# Patient Record
Sex: Female | Born: 1950
Health system: Southern US, Community
[De-identification: ages and names within clinical notes are randomized; demographics above are authoritative.]

## PROBLEM LIST (undated history)

## (undated) DIAGNOSIS — E119 Type 2 diabetes mellitus without complications: Secondary | ICD-10-CM

## (undated) DIAGNOSIS — T7840XA Allergy, unspecified, initial encounter: Secondary | ICD-10-CM

## (undated) DIAGNOSIS — F329 Major depressive disorder, single episode, unspecified: Secondary | ICD-10-CM

## (undated) DIAGNOSIS — K219 Gastro-esophageal reflux disease without esophagitis: Secondary | ICD-10-CM

## (undated) DIAGNOSIS — M1990 Unspecified osteoarthritis, unspecified site: Secondary | ICD-10-CM

## (undated) DIAGNOSIS — F32A Depression, unspecified: Secondary | ICD-10-CM

## (undated) DIAGNOSIS — G709 Myoneural disorder, unspecified: Secondary | ICD-10-CM

## (undated) DIAGNOSIS — R011 Cardiac murmur, unspecified: Secondary | ICD-10-CM

## (undated) DIAGNOSIS — G4730 Sleep apnea, unspecified: Secondary | ICD-10-CM

## (undated) DIAGNOSIS — I10 Essential (primary) hypertension: Secondary | ICD-10-CM

## (undated) HISTORY — DX: Sleep apnea, unspecified: G47.30

## (undated) HISTORY — DX: Cardiac murmur, unspecified: R01.1

## (undated) HISTORY — DX: Gastro-esophageal reflux disease without esophagitis: K21.9

## (undated) HISTORY — DX: Major depressive disorder, single episode, unspecified: F32.9

## (undated) HISTORY — DX: Allergy, unspecified, initial encounter: T78.40XA

## (undated) HISTORY — DX: Essential (primary) hypertension: I10

## (undated) HISTORY — PX: ABDOMINAL HYSTERECTOMY: SHX81

## (undated) HISTORY — DX: Unspecified osteoarthritis, unspecified site: M19.90

## (undated) HISTORY — DX: Type 2 diabetes mellitus without complications: E11.9

## (undated) HISTORY — DX: Depression, unspecified: F32.A

## (undated) HISTORY — DX: Myoneural disorder, unspecified: G70.9

---
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
## 1994-08-18 DIAGNOSIS — Z90710 Acquired absence of both cervix and uterus: Secondary | ICD-10-CM | POA: Insufficient documentation

## 1998-08-23 ENCOUNTER — Encounter: Admission: RE | Admit: 1998-08-23 | Discharge: 1998-11-21 | Payer: Self-pay | Admitting: Emergency Medicine

## 1999-03-11 ENCOUNTER — Encounter: Admission: RE | Admit: 1999-03-11 | Discharge: 1999-03-11 | Payer: Self-pay | Admitting: Emergency Medicine

## 1999-03-11 ENCOUNTER — Encounter: Payer: Self-pay | Admitting: Emergency Medicine

## 1999-03-18 ENCOUNTER — Encounter: Admission: RE | Admit: 1999-03-18 | Discharge: 1999-03-18 | Payer: Self-pay | Admitting: Emergency Medicine

## 1999-03-18 ENCOUNTER — Encounter: Payer: Self-pay | Admitting: Emergency Medicine

## 2000-12-13 ENCOUNTER — Encounter: Payer: Self-pay | Admitting: Emergency Medicine

## 2000-12-13 ENCOUNTER — Encounter: Admission: RE | Admit: 2000-12-13 | Discharge: 2000-12-13 | Payer: Self-pay | Admitting: Emergency Medicine

## 2000-12-19 ENCOUNTER — Encounter: Payer: Self-pay | Admitting: Emergency Medicine

## 2000-12-19 ENCOUNTER — Encounter: Admission: RE | Admit: 2000-12-19 | Discharge: 2000-12-19 | Payer: Self-pay | Admitting: Emergency Medicine

## 2001-06-25 ENCOUNTER — Encounter: Payer: Self-pay | Admitting: Emergency Medicine

## 2001-06-25 ENCOUNTER — Encounter: Admission: RE | Admit: 2001-06-25 | Discharge: 2001-06-25 | Payer: Self-pay | Admitting: Emergency Medicine

## 2002-07-29 ENCOUNTER — Encounter: Admission: RE | Admit: 2002-07-29 | Discharge: 2002-07-29 | Payer: Self-pay | Admitting: Emergency Medicine

## 2002-07-29 ENCOUNTER — Encounter: Payer: Self-pay | Admitting: Emergency Medicine

## 2003-03-25 ENCOUNTER — Encounter (INDEPENDENT_AMBULATORY_CARE_PROVIDER_SITE_OTHER): Payer: Self-pay | Admitting: *Deleted

## 2003-03-25 ENCOUNTER — Ambulatory Visit (HOSPITAL_COMMUNITY): Admission: RE | Admit: 2003-03-25 | Discharge: 2003-03-25 | Payer: Self-pay | Admitting: Gastroenterology

## 2003-05-29 ENCOUNTER — Encounter: Admission: RE | Admit: 2003-05-29 | Discharge: 2003-05-29 | Payer: Self-pay | Admitting: Emergency Medicine

## 2003-07-09 ENCOUNTER — Encounter: Admission: RE | Admit: 2003-07-09 | Discharge: 2003-07-09 | Payer: Self-pay | Admitting: Emergency Medicine

## 2004-01-01 ENCOUNTER — Other Ambulatory Visit: Admission: RE | Admit: 2004-01-01 | Discharge: 2004-01-01 | Payer: Self-pay | Admitting: Emergency Medicine

## 2004-07-12 ENCOUNTER — Encounter: Admission: RE | Admit: 2004-07-12 | Discharge: 2004-07-12 | Payer: Self-pay | Admitting: Emergency Medicine

## 2005-10-27 ENCOUNTER — Encounter: Admission: RE | Admit: 2005-10-27 | Discharge: 2005-10-27 | Payer: Self-pay | Admitting: Emergency Medicine

## 2006-10-30 ENCOUNTER — Encounter: Admission: RE | Admit: 2006-10-30 | Discharge: 2006-10-30 | Payer: Self-pay | Admitting: Emergency Medicine

## 2008-06-17 ENCOUNTER — Other Ambulatory Visit: Admission: RE | Admit: 2008-06-17 | Discharge: 2008-06-17 | Payer: Self-pay | Admitting: Emergency Medicine

## 2008-09-01 ENCOUNTER — Other Ambulatory Visit: Admission: RE | Admit: 2008-09-01 | Discharge: 2008-09-01 | Payer: Self-pay | Admitting: Diagnostic Radiology

## 2008-09-01 ENCOUNTER — Encounter (INDEPENDENT_AMBULATORY_CARE_PROVIDER_SITE_OTHER): Payer: Self-pay | Admitting: Diagnostic Radiology

## 2008-09-01 ENCOUNTER — Encounter: Admission: RE | Admit: 2008-09-01 | Discharge: 2008-09-01 | Payer: Self-pay | Admitting: Otolaryngology

## 2009-03-16 ENCOUNTER — Encounter: Admission: RE | Admit: 2009-03-16 | Discharge: 2009-03-16 | Payer: Self-pay | Admitting: Otolaryngology

## 2009-10-13 ENCOUNTER — Emergency Department (HOSPITAL_COMMUNITY): Admission: EM | Admit: 2009-10-13 | Discharge: 2009-10-13 | Payer: Self-pay | Admitting: Family Medicine

## 2009-12-14 ENCOUNTER — Encounter: Admission: RE | Admit: 2009-12-14 | Discharge: 2009-12-14 | Payer: Self-pay | Admitting: Internal Medicine

## 2009-12-14 ENCOUNTER — Encounter: Admission: RE | Admit: 2009-12-14 | Discharge: 2009-12-14 | Payer: Self-pay | Admitting: Endocrinology

## 2009-12-20 ENCOUNTER — Encounter: Admission: RE | Admit: 2009-12-20 | Discharge: 2009-12-20 | Payer: Self-pay | Admitting: Internal Medicine

## 2010-01-04 ENCOUNTER — Other Ambulatory Visit: Admission: RE | Admit: 2010-01-04 | Discharge: 2010-01-04 | Payer: Self-pay | Admitting: Interventional Radiology

## 2010-01-04 ENCOUNTER — Encounter: Admission: RE | Admit: 2010-01-04 | Discharge: 2010-01-04 | Payer: Self-pay | Admitting: Endocrinology

## 2010-04-29 LAB — POCT I-STAT, CHEM 8
BUN: 9 mg/dL (ref 6–23)
Calcium, Ion: 1.19 mmol/L (ref 1.12–1.32)
Hemoglobin: 12.9 g/dL (ref 12.0–15.0)
TCO2: 25 mmol/L (ref 0–100)

## 2010-04-29 LAB — D-DIMER, QUANTITATIVE: D-Dimer, Quant: <0.22 ug{FEU}/mL (ref 0.00–0.48)

## 2010-07-01 NOTE — Op Note (Signed)
NAME:  Michelle Hicks, Michelle Hicks                        ACCOUNT NO.:  [id number]   MEDICAL RECORD NO.:  [id number]                   PATIENT TYPE:  AMB   LOCATION:  ENDO                                 FACILITY:  [facility]   PHYSICIAN:  John C. Madilyn Fireman, M.D.                 DATE OF BIRTH:  11/20/1950   DATE OF PROCEDURE:  03/25/2003  DATE OF DISCHARGE:                                 OPERATIVE REPORT   PROCEDURE:  Esophagogastroduodenoscopy.   INDICATION FOR PROCEDURE:  Recurrent dyspepsia despite recent treatment for  eradication of Helicobacter in a patient who is also undergoing colonoscopy  today.   DESCRIPTION OF PROCEDURE:  The patient was placed in the left lateral  decubitus position and placed on the pulse monitor with continuous low-flow  oxygen delivered by nasal cannula.  She was sedated with 62.5 mcg IV  fentanyl and 6 mg IV Versed.  The Olympus video endoscope was advanced under  direct vision into the oropharynx and esophagus.  The esophagus was straight  and of normal caliber with the squamocolumnar line at 38 cm.  There was no  visible hiatal hernia, ring, stricture, or other abnormality of the GE  junction.  The stomach was entered and a small amount of liquid secretions  was suctioned from the fundus.  Retroflexed view of the cardia was  unremarkable.  The fundus, body, antrum, and pylorus all appeared normal.  The duodenum was entered and both the bulb and second portion were well-  inspected and appeared to be within normal limits.  The scope was withdrawn  back into the stomach and a CLOtest obtained.  The scope was then withdrawn  and the patient returned to the recovery room in stable condition.  She  tolerated the procedure well, and there were no immediate complications.   IMPRESSION:  Essentially normal endoscopy.   PLAN:  Await CLOtest, and will proceed to colonoscopy.                                               John C. Madilyn Fireman, M.D.    JCH/MEDQ  D:  03/25/2003   T:  03/25/2003  Job:  [id number]   cc:   Oley Balm. Georgina Pillion, M.D.  [address]  [place]  [place] [address]  Fax: [phone]

## 2010-07-01 NOTE — Op Note (Signed)
NAME:  Michelle Hicks, Michelle Hicks                        ACCOUNT NO.:  [id number]   MEDICAL RECORD NO.:  [id number]                   PATIENT TYPE:  AMB   LOCATION:  ENDO                                 FACILITY:  [facility]   PHYSICIAN:  John C. Madilyn Fireman, M.D.                 DATE OF BIRTH:  1950/12/08   DATE OF PROCEDURE:  03/25/2003  DATE OF DISCHARGE:                                 OPERATIVE REPORT   PROCEDURE:  Colonoscopy.   INDICATION FOR PROCEDURE:  Family history of colon cancer in a second degree  relative.   DESCRIPTION OF PROCEDURE:  The patient was placed in the left lateral  decubitus position and placed on the pulse monitor with continuous low-flow  oxygen delivered by nasal cannula.  She was sedated with 37.5 mcg IV  fentanyl and 3 mg IV Versed.  The Olympus video colonoscope was inserted  into the rectum and advanced to the cecum, confirmed by transillumination at  McBurney's point and visualization of the ileocecal valve and appendiceal  orifice.  The prep was excellent.  The cecum appeared normal with no masses,  polyps, diverticula, or other mucosal abnormalities.  Within the ascending  colon there was an 8-10 mm pedunculated polyp, which was removed by snare.  The remainder of the ascending, transverse, descending, sigmoid, and rectum  appeared normal with no further polyps, masses, diverticula, or other  abnormalities.  A retroflexed view of the anus revealed no obvious internal  hemorrhoids.  The scope was then withdrawn and the patient returned to the  recovery room in stable condition.  She tolerated the procedure well, and  there were no immediate complications.   IMPRESSION:  An 8 mm ascending colon polyp.   PLAN:  Await histology to determine method and interval for future colon  screening.                                               John C. Madilyn Fireman, M.D.    JCH/MEDQ  D:  03/25/2003  T:  03/25/2003  Job:  [id number]   cc:   Oley Balm. Georgina Pillion, M.D.  [address]  [place]  [place] [address]  Fax: [phone]

## 2010-12-14 ENCOUNTER — Other Ambulatory Visit: Payer: Self-pay | Admitting: Internal Medicine

## 2010-12-14 DIAGNOSIS — Z1231 Encounter for screening mammogram for malignant neoplasm of breast: Secondary | ICD-10-CM

## 2010-12-20 ENCOUNTER — Ambulatory Visit: Payer: Self-pay

## 2010-12-29 ENCOUNTER — Ambulatory Visit
Admission: RE | Admit: 2010-12-29 | Discharge: 2010-12-29 | Disposition: A | Discharge status: Home / Self Care | Payer: Managed Care, Other (non HMO) | Source: Ambulatory Visit | Attending: Internal Medicine | Admitting: Internal Medicine

## 2010-12-29 DIAGNOSIS — Z1231 Encounter for screening mammogram for malignant neoplasm of breast: Secondary | ICD-10-CM

## 2011-04-06 ENCOUNTER — Other Ambulatory Visit: Payer: Self-pay | Admitting: Endocrinology

## 2011-04-06 DIAGNOSIS — E049 Nontoxic goiter, unspecified: Secondary | ICD-10-CM

## 2011-05-19 ENCOUNTER — Ambulatory Visit
Admission: RE | Admit: 2011-05-19 | Discharge: 2011-05-19 | Disposition: A | Discharge status: Home / Self Care | Payer: Managed Care, Other (non HMO) | Source: Ambulatory Visit | Attending: Endocrinology | Admitting: Endocrinology

## 2011-05-19 DIAGNOSIS — E049 Nontoxic goiter, unspecified: Secondary | ICD-10-CM

## 2011-05-31 ENCOUNTER — Other Ambulatory Visit: Payer: Self-pay | Admitting: Endocrinology

## 2011-05-31 DIAGNOSIS — E049 Nontoxic goiter, unspecified: Secondary | ICD-10-CM

## 2011-07-12 ENCOUNTER — Other Ambulatory Visit: Payer: Self-pay

## 2011-07-12 ENCOUNTER — Telehealth: Payer: Self-pay

## 2011-07-12 NOTE — Telephone Encounter (Signed)
Pt called and stated she was calling to let Dr.Stringer know she has not received Estrogen Rx. Her pharm is [facility] on [address] . Pt pharm has been added to her chart.

## 2011-07-13 ENCOUNTER — Telehealth: Payer: Self-pay

## 2011-07-13 NOTE — Telephone Encounter (Signed)
Message was left for pt to call the office back Regarding Rx she stated she has not received. Pt will be made aware that her Ins denied the Rx for estrogen. [facility] CMA

## 2011-07-17 ENCOUNTER — Other Ambulatory Visit: Payer: Managed Care, Other (non HMO)

## 2011-07-17 ENCOUNTER — Telehealth: Payer: Self-pay

## 2011-07-17 NOTE — Telephone Encounter (Signed)
Pt was called and made aware that her Ins denied Rx for Estrogen . [facility] CMA

## 2011-08-30 IMAGING — US US BIOPSY
1 series · 12 of 12 positions shown · non-contrast
Comparison: 12/14/2009

CLINICAL DATA: Dominant right thyroid nodule demonstrating
enlargement.  Previous biopsy.

ULTRASOUND GUIDED NEEDLE ASPIRATE BIOPSY OF THE THYROID GLAND

[Series 1: us biopsy · 0.08mm/px · 12 acquisitions, 12 frames shown]
[im 1/12]
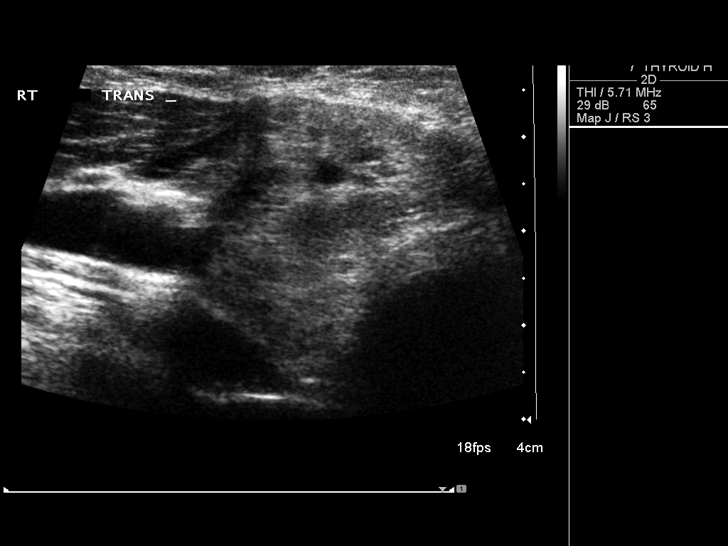
[im 2/12]
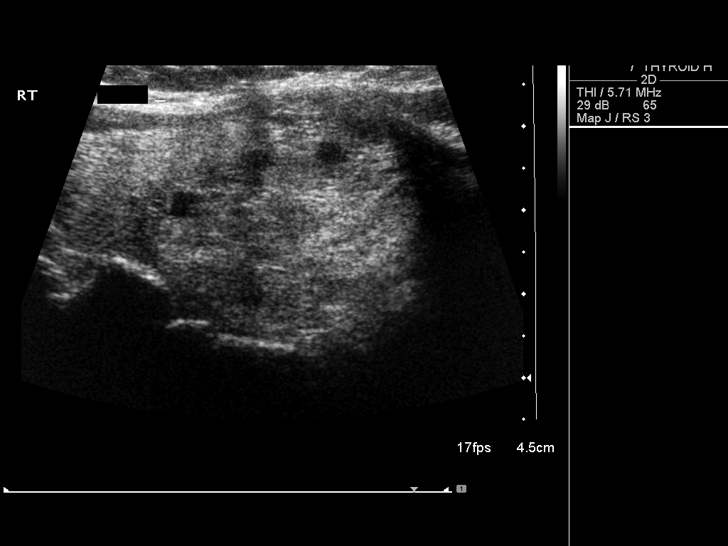
[im 3/12]
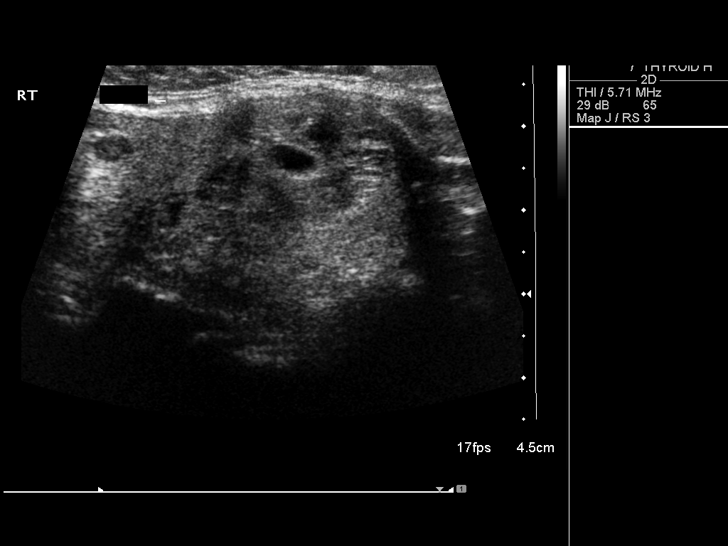
[im 4/12]
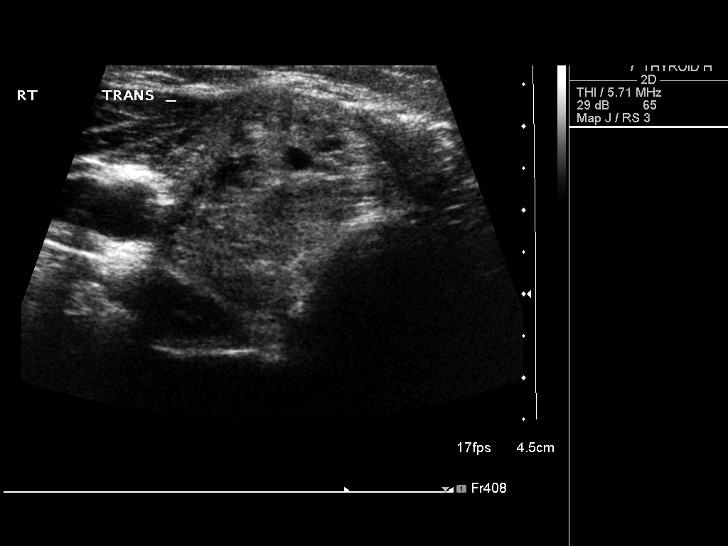
[im 5/12]
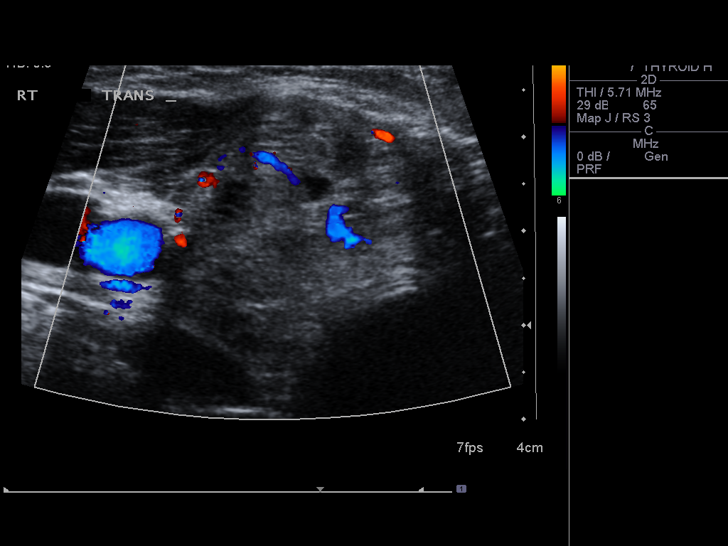
[im 6/12]
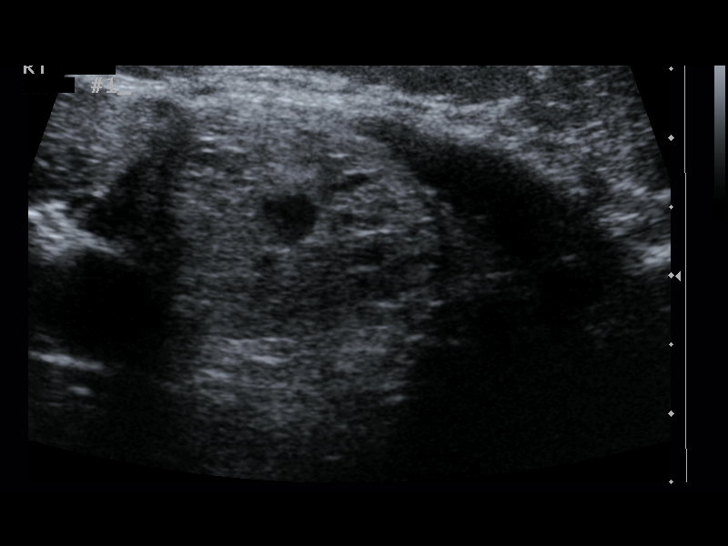
[im 7/12]
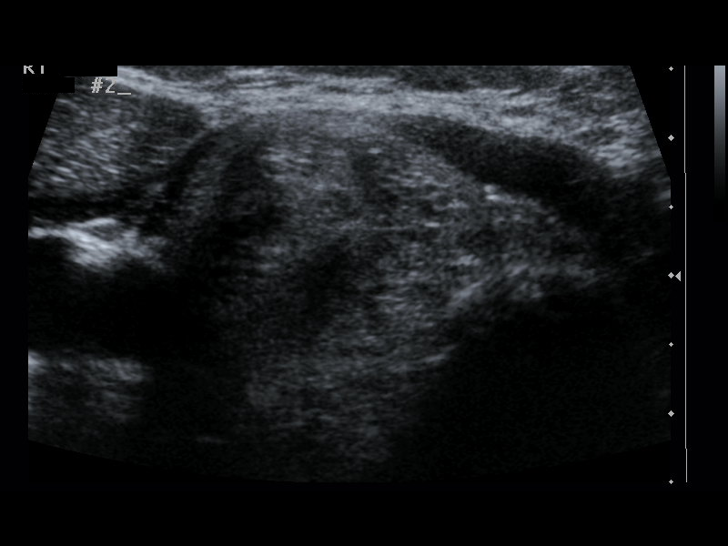
[im 8/12]
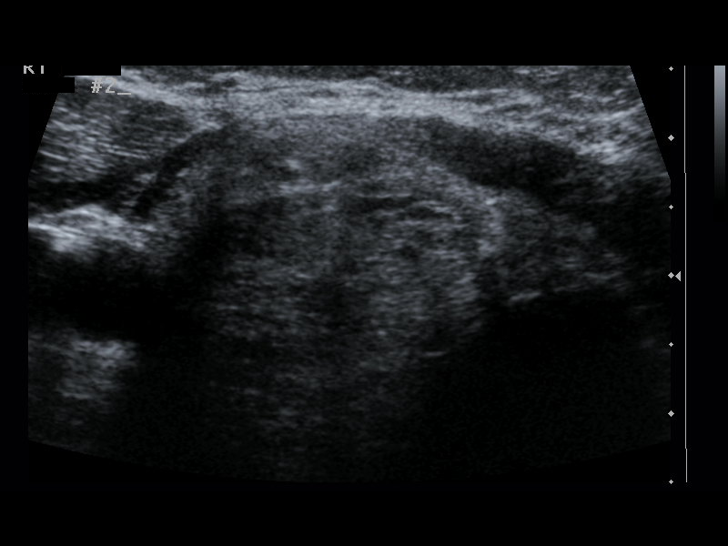
[im 9/12]
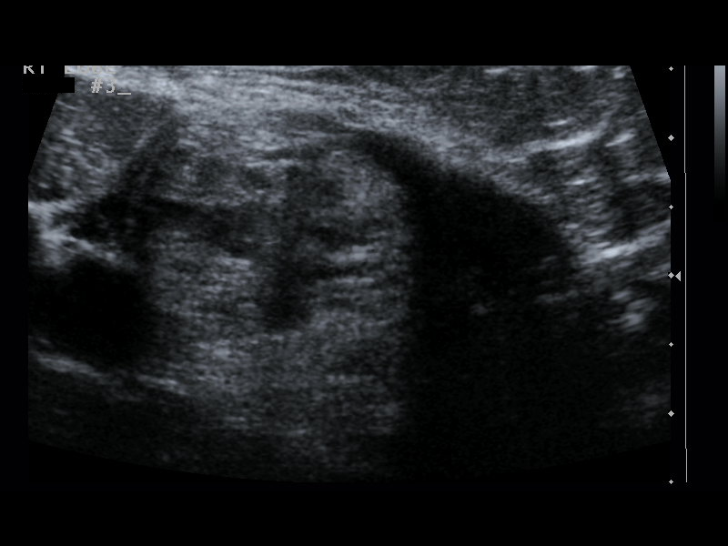
[im 10/12]
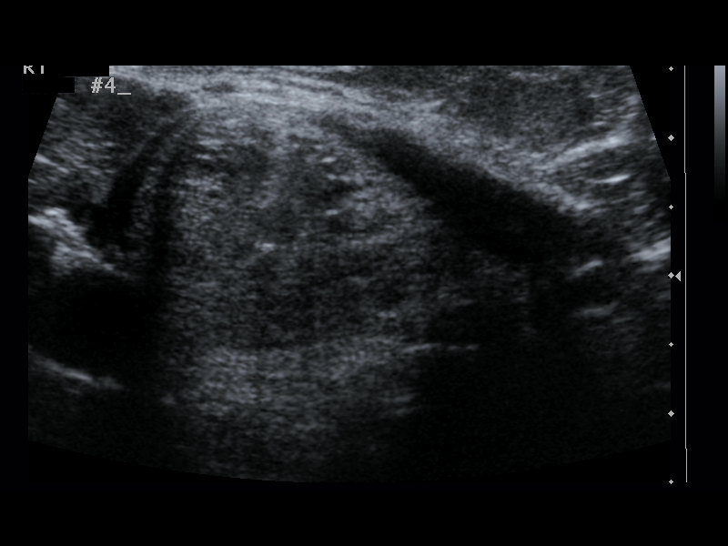
[im 11/12]
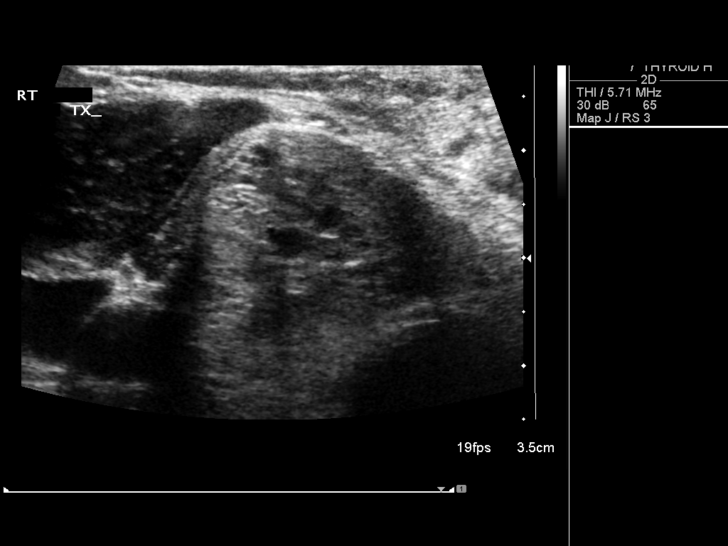
[im 12/12]
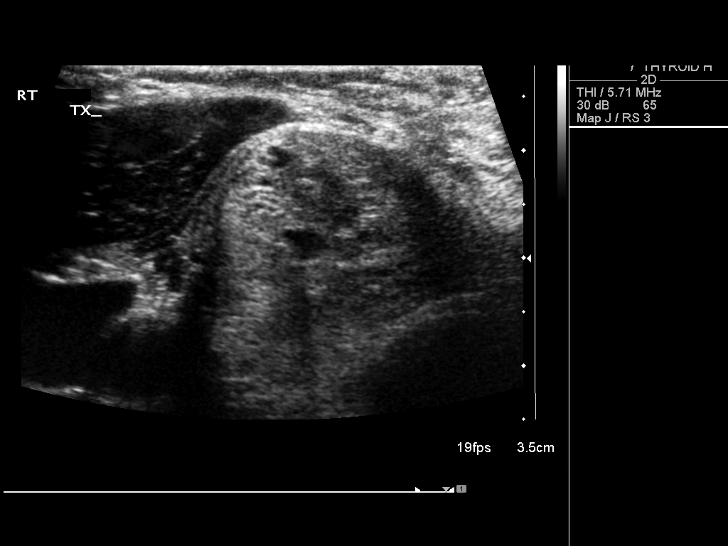

[12 of 12 positions shown; findings below may reference images not displayed]

Thyroid biopsy was thoroughly discussed with the patient and
questions were answered.  The benefits, risks, alternatives, and
complications were also discussed.  The patient understands and
wishes to proceed with the procedure.  Written consent was
obtained.

Ultrasound was performed to localize and mark an adequate site for
the biopsy.  The patient was then prepped and draped in a normal
sterile fashion.  Local anesthesia was provided with 1% lidocaine.
Using direct ultrasound guidance, 4 passes were made using 25 gauge
needles into the nodule within the right lobe of the thyroid.
Ultrasound was used to confirm needle placements on all occasions.
Specimens were sent to Pathology for analysis.

Complications:  None
FINDINGS: The dominant right thyroid nodule was localized.  Needle
aspirate samples were obtained in different portions.
IMPRESSION: Ultrasound guided needle aspirate biopsy performed of the right
thyroid nodule.

## 2011-11-20 ENCOUNTER — Other Ambulatory Visit: Payer: Managed Care, Other (non HMO)

## 2011-11-24 ENCOUNTER — Other Ambulatory Visit: Payer: Managed Care, Other (non HMO)

## 2012-01-16 ENCOUNTER — Other Ambulatory Visit: Payer: Self-pay | Admitting: Internal Medicine

## 2012-01-16 DIAGNOSIS — Z1231 Encounter for screening mammogram for malignant neoplasm of breast: Secondary | ICD-10-CM

## 2012-02-19 ENCOUNTER — Ambulatory Visit
Admission: RE | Admit: 2012-02-19 | Discharge: 2012-02-19 | Disposition: A | Discharge status: Home / Self Care | Payer: Private Health Insurance - Indemnity | Source: Ambulatory Visit | Attending: Internal Medicine | Admitting: Internal Medicine

## 2012-02-19 DIAGNOSIS — Z1231 Encounter for screening mammogram for malignant neoplasm of breast: Secondary | ICD-10-CM

## 2012-02-27 ENCOUNTER — Other Ambulatory Visit: Payer: Self-pay | Admitting: Internal Medicine

## 2012-02-27 DIAGNOSIS — N63 Unspecified lump in unspecified breast: Secondary | ICD-10-CM

## 2012-03-04 ENCOUNTER — Other Ambulatory Visit: Payer: Self-pay | Admitting: Endocrinology

## 2012-03-04 DIAGNOSIS — E049 Nontoxic goiter, unspecified: Secondary | ICD-10-CM

## 2012-03-11 ENCOUNTER — Ambulatory Visit
Admission: RE | Admit: 2012-03-11 | Discharge: 2012-03-11 | Disposition: A | Discharge status: Home / Self Care | Payer: Private Health Insurance - Indemnity | Source: Ambulatory Visit | Attending: Internal Medicine | Admitting: Internal Medicine

## 2012-03-11 ENCOUNTER — Other Ambulatory Visit: Payer: Self-pay | Admitting: Internal Medicine

## 2012-03-11 DIAGNOSIS — N63 Unspecified lump in unspecified breast: Secondary | ICD-10-CM

## 2012-03-18 ENCOUNTER — Ambulatory Visit: Payer: Managed Care, Other (non HMO) | Admitting: Obstetrics and Gynecology

## 2012-03-18 ENCOUNTER — Encounter: Payer: Self-pay | Admitting: Obstetrics and Gynecology

## 2012-03-18 VITALS — BP 138/82 | Resp 18 | Ht 66.0 in | Wt 256.0 lb

## 2012-03-18 DIAGNOSIS — Z01419 Encounter for gynecological examination (general) (routine) without abnormal findings: Secondary | ICD-10-CM

## 2012-03-18 NOTE — Progress Notes (Signed)
Subjective:    Michelle Hicks is a 62 y.o. female No obstetric history on file. who presents for annual exam. The patient complaints of memory loss.  She sees a psychiatrist who has diagnosed her with depression and ADD.  She is scheduled to see a neurologist.  She no longer takes her estrogen patch.  She does feel hot but feels she does not need hormone replacement therapy. She has some urinary incontinence.  She is status post hysterectomy.  The following portions of the patient's history were reviewed and updated as appropriate: allergies, current medications, past family history, past medical history, past social history, past surgical history and problem list.  Review of Systems Pertinent items are noted in HPI. Gastrointestinal:No change in bowel habits, no abdominal pain, no rectal bleeding Genitourinary:negative for dysuria, frequency, hematuria, nocturia and urinary incontinence    Objective:     BP 138/82  Resp 18  Ht 5\' 6"  (1.676 m)  Wt 256 lb (116.121 kg)  BMI 41.32 kg/m2  Weight:  Wt Readings from Last 1 Encounters:  03/18/12 256 lb (116.121 kg)     BMI: Body mass index is 41.32 kg/(m^2). General Appearance: Alert, appropriate appearance for age. No acute distress HEENT: Grossly normal Neck / Thyroid: Supple, no masses, nodes or enlargement Lungs: clear to auscultation bilaterally Back: No CVA tenderness Breast Exam: No masses or nodes.No dimpling, nipple retraction or discharge. Cardiovascular: Regular rate and rhythm. S1, S2, no murmur Gastrointestinal: Soft, non-tender, no masses or organomegaly  ++++++++++++++++++++++++++++++++++++++++++++++++++++++++  Pelvic Exam: External genitalia: normal general appearance Vaginal: normal without tenderness, induration or masses, relaxation noted Cervix: absent Adnexa: normal bimanual exam Uterus: absent Rectovaginal: normal rectal, no masses  ++++++++++++++++++++++++++++++++++++++++++++++++++++++++  Lymphatic Exam:  Non-palpable nodes in neck, clavicular, axillary, or inguinal regions  Psychiatric: Alert and oriented, appropriate affect.      Assessment:    Menopause memory loss   Overweight or obese: Yes  Pelvic relaxation: Yes  Menopausal symptoms: Yes. Severe: Yes.   Plan:    the patient will followup with her psychiatrist and neurologist   Follow-up:  for annual exam  The updated Pap smear screening guidelines were discussed with the patient. The patient requested that I obtain a Pap smear: No.  Kegel exercises discussed: Yes.  Proper diet and regular exercise were reviewed.  Annual mammograms recommended starting at age 90. Proper breast care was discussed.  Screening colonoscopy is recommended beginning at age 33.  Regular health maintenance was reviewed.  Sleep hygiene was discussed.  Adequate calcium and vitamin D intake was emphasized.  Leonard Schwartz M.D.   Regular Periods: no Mammogram: yes  Monthly Breast Ex.: yes Exercise: yes "Walking daily"  Tetanus < 10 years: yes Seatbelts: yes  NI. Bladder Functn.: yes "sometimes leaking sometimes"  Abuse at home: no  Daily BM's: yes Stressful Work: yes  Healthy Diet: yes Sigmoid-Colonoscopy: 2012 "WNL" Polyps were removed.  Calcium: yes Medical problems this year: Menopause. Pt states she has been out of work x 2 months because of stress.    LAST PAP:03/2008 per pt   Contraception: HYST   Mammogram:  03/11/2012 "WNL"  PCP: Dr.Robin Allyne Gee  PMH: No changes  FMH: No changes  Last Bone Scan: Never.

## 2012-04-17 ENCOUNTER — Other Ambulatory Visit: Payer: Private Health Insurance - Indemnity

## 2012-04-17 ENCOUNTER — Ambulatory Visit
Admission: RE | Admit: 2012-04-17 | Discharge: 2012-04-17 | Disposition: A | Discharge status: Home / Self Care | Payer: Managed Care, Other (non HMO) | Source: Ambulatory Visit | Attending: Endocrinology | Admitting: Endocrinology

## 2012-04-17 DIAGNOSIS — E049 Nontoxic goiter, unspecified: Secondary | ICD-10-CM

## 2012-05-13 ENCOUNTER — Other Ambulatory Visit: Payer: Self-pay | Admitting: Endocrinology

## 2012-05-13 DIAGNOSIS — E049 Nontoxic goiter, unspecified: Secondary | ICD-10-CM

## 2012-08-19 DIAGNOSIS — R413 Other amnesia: Secondary | ICD-10-CM

## 2012-08-19 DIAGNOSIS — R41840 Attention and concentration deficit: Secondary | ICD-10-CM

## 2012-08-19 DIAGNOSIS — F331 Major depressive disorder, recurrent, moderate: Secondary | ICD-10-CM

## 2012-08-26 DIAGNOSIS — R413 Other amnesia: Secondary | ICD-10-CM

## 2012-08-26 DIAGNOSIS — F331 Major depressive disorder, recurrent, moderate: Secondary | ICD-10-CM

## 2012-08-26 DIAGNOSIS — R41840 Attention and concentration deficit: Secondary | ICD-10-CM

## 2012-10-28 ENCOUNTER — Ambulatory Visit
Admission: RE | Admit: 2012-10-28 | Discharge: 2012-10-28 | Disposition: A | Discharge status: Home / Self Care | Payer: Managed Care, Other (non HMO) | Source: Ambulatory Visit | Attending: Endocrinology | Admitting: Endocrinology

## 2012-10-28 DIAGNOSIS — E049 Nontoxic goiter, unspecified: Secondary | ICD-10-CM

## 2012-11-15 ENCOUNTER — Other Ambulatory Visit: Payer: Self-pay | Admitting: Endocrinology

## 2012-11-15 DIAGNOSIS — E049 Nontoxic goiter, unspecified: Secondary | ICD-10-CM

## 2013-04-22 ENCOUNTER — Other Ambulatory Visit: Payer: Self-pay

## 2013-04-22 DIAGNOSIS — Z1231 Encounter for screening mammogram for malignant neoplasm of breast: Secondary | ICD-10-CM

## 2013-05-13 ENCOUNTER — Ambulatory Visit
Admission: RE | Admit: 2013-05-13 | Discharge: 2013-05-13 | Disposition: A | Discharge status: Home / Self Care | Payer: Managed Care, Other (non HMO) | Source: Ambulatory Visit

## 2013-05-13 DIAGNOSIS — Z1231 Encounter for screening mammogram for malignant neoplasm of breast: Secondary | ICD-10-CM

## 2013-11-17 ENCOUNTER — Ambulatory Visit
Admission: RE | Admit: 2013-11-17 | Discharge: 2013-11-17 | Disposition: A | Discharge status: Home / Self Care | Payer: Managed Care, Other (non HMO) | Source: Ambulatory Visit | Attending: Endocrinology | Admitting: Endocrinology

## 2013-11-17 DIAGNOSIS — E049 Nontoxic goiter, unspecified: Secondary | ICD-10-CM

## 2014-01-13 ENCOUNTER — Other Ambulatory Visit: Payer: Self-pay | Admitting: Internal Medicine

## 2014-01-13 DIAGNOSIS — G4489 Other headache syndrome: Secondary | ICD-10-CM

## 2014-01-23 ENCOUNTER — Ambulatory Visit
Admission: RE | Admit: 2014-01-23 | Discharge: 2014-01-23 | Disposition: A | Discharge status: Home / Self Care | Payer: Managed Care, Other (non HMO) | Source: Ambulatory Visit | Attending: Internal Medicine | Admitting: Internal Medicine

## 2014-01-23 DIAGNOSIS — G4489 Other headache syndrome: Secondary | ICD-10-CM

## 2014-04-14 ENCOUNTER — Other Ambulatory Visit: Payer: Self-pay

## 2014-04-14 DIAGNOSIS — Z1231 Encounter for screening mammogram for malignant neoplasm of breast: Secondary | ICD-10-CM

## 2014-05-15 ENCOUNTER — Ambulatory Visit: Payer: Managed Care, Other (non HMO)

## 2014-07-02 ENCOUNTER — Ambulatory Visit
Admission: RE | Admit: 2014-07-02 | Discharge: 2014-07-02 | Disposition: A | Discharge status: Home / Self Care | Payer: Managed Care, Other (non HMO) | Source: Ambulatory Visit

## 2014-07-02 ENCOUNTER — Other Ambulatory Visit: Payer: Self-pay

## 2014-07-02 DIAGNOSIS — Z1231 Encounter for screening mammogram for malignant neoplasm of breast: Secondary | ICD-10-CM

## 2015-04-15 MED FILL — FLUoxetine HCL 20 MG CAPS: 20 | 30 days supply | Qty: 90 | Fill #0

## 2015-04-15 MED FILL — FLUTICASONE PROP 50 MCG SPR: 50 | 30 days supply | Qty: 16 | Fill #0

## 2015-04-15 MED FILL — NOREL AD TABLET: 4-10-325 | 10 days supply | Qty: 20 | Fill #0

## 2015-04-23 ENCOUNTER — Other Ambulatory Visit: Payer: Self-pay | Admitting: Endocrinology

## 2015-04-23 DIAGNOSIS — E049 Nontoxic goiter, unspecified: Secondary | ICD-10-CM

## 2015-04-30 ENCOUNTER — Ambulatory Visit
Admission: RE | Admit: 2015-04-30 | Discharge: 2015-04-30 | Disposition: A | Discharge status: Home / Self Care | Payer: 59 | Source: Ambulatory Visit | Attending: Endocrinology | Admitting: Endocrinology

## 2015-04-30 DIAGNOSIS — E049 Nontoxic goiter, unspecified: Secondary | ICD-10-CM

## 2015-05-19 MED FILL — FLUoxetine HCL 20 MG CAPS: 20 | 30 days supply | Qty: 90 | Fill #1

## 2015-07-05 MED FILL — FLUTICASONE PROP 50 MCG SPR: 50 | 30 days supply | Qty: 16 | Fill #1

## 2015-09-20 ENCOUNTER — Other Ambulatory Visit: Payer: Self-pay | Admitting: Internal Medicine

## 2015-09-20 DIAGNOSIS — Z1231 Encounter for screening mammogram for malignant neoplasm of breast: Secondary | ICD-10-CM

## 2015-09-29 ENCOUNTER — Ambulatory Visit
Admission: RE | Admit: 2015-09-29 | Discharge: 2015-09-29 | Disposition: A | Discharge status: Home / Self Care | Payer: 59 | Source: Ambulatory Visit | Attending: Internal Medicine | Admitting: Internal Medicine

## 2015-09-29 DIAGNOSIS — Z1231 Encounter for screening mammogram for malignant neoplasm of breast: Secondary | ICD-10-CM

## 2015-12-29 MED FILL — PRAVASTATIN NA 40 MG TAB: 40 | 30 days supply | Qty: 30 | Fill #0

## 2016-01-07 MED FILL — ONE TOUCH ULTRA 2 GLUCOSE S: W/DEVICE | 30 days supply | Qty: 1 | Fill #0

## 2016-02-23 MED FILL — PRAVASTATIN NA 40 MG TAB: 40 | 30 days supply | Qty: 30 | Fill #1

## 2016-10-19 ENCOUNTER — Other Ambulatory Visit: Payer: Self-pay | Admitting: Endocrinology

## 2016-10-19 DIAGNOSIS — E049 Nontoxic goiter, unspecified: Secondary | ICD-10-CM

## 2016-11-10 ENCOUNTER — Other Ambulatory Visit: Payer: Self-pay | Admitting: Internal Medicine

## 2016-11-10 DIAGNOSIS — Z1231 Encounter for screening mammogram for malignant neoplasm of breast: Secondary | ICD-10-CM

## 2016-11-24 ENCOUNTER — Ambulatory Visit
Admission: RE | Admit: 2016-11-24 | Discharge: 2016-11-24 | Disposition: A | Discharge status: Home / Self Care | Payer: 59 | Source: Ambulatory Visit | Attending: Internal Medicine | Admitting: Internal Medicine

## 2016-11-24 DIAGNOSIS — Z1231 Encounter for screening mammogram for malignant neoplasm of breast: Secondary | ICD-10-CM

## 2016-12-19 ENCOUNTER — Other Ambulatory Visit: Payer: 59

## 2017-03-21 ENCOUNTER — Ambulatory Visit
Admission: RE | Admit: 2017-03-21 | Discharge: 2017-03-21 | Disposition: A | Discharge status: Home / Self Care | Payer: 59 | Source: Ambulatory Visit | Attending: Endocrinology | Admitting: Endocrinology

## 2017-03-21 DIAGNOSIS — E049 Nontoxic goiter, unspecified: Secondary | ICD-10-CM

## 2017-07-20 ENCOUNTER — Other Ambulatory Visit: Payer: Self-pay | Admitting: Endocrinology

## 2017-07-20 DIAGNOSIS — R1310 Dysphagia, unspecified: Secondary | ICD-10-CM

## 2017-07-31 ENCOUNTER — Ambulatory Visit
Admission: RE | Admit: 2017-07-31 | Discharge: 2017-07-31 | Disposition: A | Discharge status: Home / Self Care | Payer: 59 | Source: Ambulatory Visit | Attending: Endocrinology | Admitting: Endocrinology

## 2017-07-31 DIAGNOSIS — R1310 Dysphagia, unspecified: Secondary | ICD-10-CM

## 2017-09-04 DIAGNOSIS — E1165 Type 2 diabetes mellitus with hyperglycemia: Secondary | ICD-10-CM | POA: Insufficient documentation

## 2017-09-04 DIAGNOSIS — IMO0002 Reserved for concepts with insufficient information to code with codable children: Secondary | ICD-10-CM | POA: Insufficient documentation

## 2017-09-04 DIAGNOSIS — I10 Essential (primary) hypertension: Secondary | ICD-10-CM | POA: Insufficient documentation

## 2017-09-04 DIAGNOSIS — E119 Type 2 diabetes mellitus without complications: Secondary | ICD-10-CM | POA: Insufficient documentation

## 2017-09-04 LAB — BASIC METABOLIC PANEL
BUN: 9 (ref 4–21)
CREATININE: 0.7 (ref 0.5–1.1)
Glucose: 121
Potassium: 4.5 (ref 3.4–5.3)
Sodium: 142 (ref 137–147)

## 2017-09-04 LAB — LIPID PANEL
Cholesterol: 158 (ref 0–200)
HDL: 65 (ref 35–70)
LDL Cholesterol: 79
LDL/HDL RATIO: 1.2
Triglycerides: 68 (ref 40–160)

## 2017-09-04 LAB — HEPATIC FUNCTION PANEL
ALT: 54 — AB (ref 7–35)
AST: 41 — AB (ref 13–35)
Alkaline Phosphatase: 62 (ref 25–125)
Bilirubin, Total: 0.3

## 2017-09-04 LAB — HEMOGLOBIN A1C: Hemoglobin A1C: 7.3

## 2017-10-09 MED FILL — TELMISARTAN-HCTZ 80-25 MG T: 80-25 | 30 days supply | Qty: 30 | Fill #0

## 2017-11-03 ENCOUNTER — Encounter: Payer: Self-pay | Admitting: Internal Medicine

## 2017-11-03 DIAGNOSIS — E1165 Type 2 diabetes mellitus with hyperglycemia: Secondary | ICD-10-CM

## 2017-11-03 DIAGNOSIS — I10 Essential (primary) hypertension: Secondary | ICD-10-CM

## 2017-11-12 ENCOUNTER — Other Ambulatory Visit: Payer: Self-pay | Admitting: Internal Medicine

## 2017-11-12 DIAGNOSIS — Z1231 Encounter for screening mammogram for malignant neoplasm of breast: Secondary | ICD-10-CM

## 2017-11-13 ENCOUNTER — Ambulatory Visit (INDEPENDENT_AMBULATORY_CARE_PROVIDER_SITE_OTHER): Payer: 59 | Admitting: Internal Medicine

## 2017-11-13 ENCOUNTER — Encounter: Payer: Self-pay | Admitting: Internal Medicine

## 2017-11-13 VITALS — BP 154/92 | HR 76 | Temp 98.4°F | Ht 63.5 in | Wt 256.2 lb

## 2017-11-13 DIAGNOSIS — K219 Gastro-esophageal reflux disease without esophagitis: Secondary | ICD-10-CM | POA: Insufficient documentation

## 2017-11-13 DIAGNOSIS — I10 Essential (primary) hypertension: Secondary | ICD-10-CM | POA: Diagnosis not present

## 2017-11-13 MED FILL — TELMISARTAN-HCTZ 80-25 MG T: 80-25 | 30 days supply | Qty: 30 | Fill #1

## 2017-11-13 NOTE — Patient Instructions (Signed)

## 2017-11-13 NOTE — Progress Notes (Signed)
   Subjective:    Patient ID: Michelle Hicks, female    DOB: [birth year], 67 y.o.   MRN: [id number]  SHE WAS STARTED ON TELMISARTAN/HCT 80/25 ONCE DAILY AFTER RECALL OF LOSARTAN, HER PREVIOUS MEDICATION. SHE STATES HER HOME READINGS ARE ABOUT 120S/80S. SHE REPORTS COMPLIANCE WITH MEDS, YET DID NOT TAKE MEDS TODAY.   Hypertension  This is a chronic problem. The current episode started more than 1 year ago. The problem is unchanged. The problem is uncontrolled. Risk factors for coronary artery disease include diabetes mellitus, sedentary lifestyle, post-menopausal state and obesity. Past treatments include lifestyle changes. The current treatment provides mild improvement.      Review of Systems  Constitutional: Negative.   HENT: Negative.   Eyes: Negative.   Respiratory: Negative.   Cardiovascular: Negative.        Objective:   Physical Exam  Constitutional: She appears well-developed and well-nourished.  HENT:  Head: Normocephalic and atraumatic.  Neck: Normal range of motion.  Cardiovascular: Normal rate and regular rhythm.  Psychiatric: She has a normal mood and affect.          Assessment & Plan:   Essential hypertension, malignant - UNCONTROLLED, SHE STATES BP IS CONTROLLED AT HOME. SHE WILL CONTINUE WITH CURRENT MEDS FOR NOW. SHE ALSO AGREES TO EMAIL BP LOGS.  - Plan: BMP8+Anion Gap, CANCELED: BMP w Anion Gap (STAT/Sunquest-performed on-site)

## 2017-11-14 LAB — BASIC METABOLIC PANEL
BUN / CREAT RATIO: 16 (ref 12–28)
BUN: 11 mg/dL (ref 8–27)
CO2: 24 mmol/L (ref 20–29)
CREATININE: 0.69 mg/dL (ref 0.57–1.00)
Calcium: 9.9 mg/dL (ref 8.7–10.3)
Chloride: 99 mmol/L (ref 96–106)
GFR calc Af Amer: 106 mL/min/{1.73_m2} (ref >59)
GFR, EST NON AFRICAN AMERICAN: 92 mL/min/{1.73_m2} (ref >59)
Glucose: 132 mg/dL — ABNORMAL HIGH (ref 65–99)
POTASSIUM: 4.7 mmol/L (ref 3.5–5.2)
SODIUM: 141 mmol/L (ref 134–144)

## 2017-11-18 NOTE — Progress Notes (Signed)
Here are your lab results:  Your kidney function is stable.   Sincerely,    Melissaann Dizdarevic N. Baird Cancer, MD

## 2017-12-06 ENCOUNTER — Encounter: Payer: Self-pay | Admitting: Internal Medicine

## 2017-12-06 ENCOUNTER — Other Ambulatory Visit: Payer: Self-pay

## 2017-12-06 ENCOUNTER — Ambulatory Visit (INDEPENDENT_AMBULATORY_CARE_PROVIDER_SITE_OTHER): Payer: 59 | Admitting: Internal Medicine

## 2017-12-06 VITALS — BP 130/76 | HR 90 | Temp 98.4°F | Ht 63.5 in | Wt 251.4 lb

## 2017-12-06 DIAGNOSIS — M545 Low back pain, unspecified: Secondary | ICD-10-CM

## 2017-12-06 DIAGNOSIS — E1165 Type 2 diabetes mellitus with hyperglycemia: Secondary | ICD-10-CM

## 2017-12-06 DIAGNOSIS — G8929 Other chronic pain: Secondary | ICD-10-CM

## 2017-12-06 DIAGNOSIS — I10 Essential (primary) hypertension: Secondary | ICD-10-CM | POA: Diagnosis not present

## 2017-12-06 DIAGNOSIS — Z6841 Body Mass Index (BMI) 40.0 and over, adult: Secondary | ICD-10-CM

## 2017-12-06 DIAGNOSIS — Z7982 Long term (current) use of aspirin: Secondary | ICD-10-CM

## 2017-12-06 LAB — CMP14+EGFR
ALK PHOS: 69 IU/L (ref 39–117)
ALT: 72 IU/L — ABNORMAL HIGH (ref 0–32)
AST: 51 IU/L — AB (ref 0–40)
Albumin/Globulin Ratio: 1.3 (ref 1.2–2.2)
Albumin: 4.4 g/dL (ref 3.6–4.8)
BILIRUBIN TOTAL: 0.3 mg/dL (ref 0.0–1.2)
BUN/Creatinine Ratio: 14 (ref 12–28)
BUN: 9 mg/dL (ref 8–27)
CHLORIDE: 97 mmol/L (ref 96–106)
CO2: 24 mmol/L (ref 20–29)
Calcium: 10 mg/dL (ref 8.7–10.3)
Creatinine, Ser: 0.65 mg/dL (ref 0.57–1.00)
GFR calc Af Amer: 108 mL/min/{1.73_m2} (ref >59)
GFR calc non Af Amer: 93 mL/min/{1.73_m2} (ref >59)
Globulin, Total: 3.4 g/dL (ref 1.5–4.5)
Glucose: 135 mg/dL — ABNORMAL HIGH (ref 65–99)
POTASSIUM: 4.5 mmol/L (ref 3.5–5.2)
Sodium: 137 mmol/L (ref 134–144)
Total Protein: 7.8 g/dL (ref 6.0–8.5)

## 2017-12-06 LAB — HEMOGLOBIN A1C
ESTIMATED AVERAGE GLUCOSE: 177 mg/dL
Hgb A1c MFr Bld: 7.8 % — ABNORMAL HIGH (ref 4.8–5.6)

## 2017-12-06 MED ORDER — TELMISARTAN-HCTZ 80-25 MG PO TABS: ORAL_TABLET | ORAL | 1 refills | Status: DC

## 2017-12-06 MED FILL — TELMISARTAN-HCTZ 80-25 MG T: 80-25 | 30 days supply | Qty: 30 | Fill #0

## 2017-12-06 NOTE — Patient Instructions (Signed)
Back Exercises If you have pain in your back, do these exercises 2-3 times each day or as told by your doctor. When the pain goes away, do the exercises once each day, but repeat the steps more times for each exercise (do more repetitions). If you do not have pain in your back, do these exercises once each day or as told by your doctor. Exercises Single Knee to Chest  Do these steps 3-5 times in a row for each leg: 1. Lie on your back on a firm bed or the floor with your legs stretched out. 2. Bring one knee to your chest. 3. Hold your knee to your chest by grabbing your knee or thigh. 4. Pull on your knee until you feel a gentle stretch in your lower back. 5. Keep doing the stretch for 10-30 seconds. 6. Slowly let go of your leg and straighten it.  Pelvic Tilt  Do these steps 5-10 times in a row: 1. Lie on your back on a firm bed or the floor with your legs stretched out. 2. Bend your knees so they point up to the ceiling. Your feet should be flat on the floor. 3. Tighten your lower belly (abdomen) muscles to press your lower back against the floor. This will make your tailbone point up to the ceiling instead of pointing down to your feet or the floor. 4. Stay in this position for 5-10 seconds while you gently tighten your muscles and breathe evenly.  Cat-Cow  Do these steps until your lower back bends more easily: 1. Get on your hands and knees on a firm surface. Keep your hands under your shoulders, and keep your knees under your hips. You may put padding under your knees. 2. Let your head hang down, and make your tailbone point down to the floor so your lower back is round like the back of a cat. 3. Stay in this position for 5 seconds. 4. Slowly lift your head and make your tailbone point up to the ceiling so your back hangs low (sags) like the back of a cow. 5. Stay in this position for 5 seconds.  Press-Ups  Do these steps 5-10 times in a row: 1. Lie on your belly (face-down)  on the floor. 2. Place your hands near your head, about shoulder-width apart. 3. While you keep your back relaxed and keep your hips on the floor, slowly straighten your arms to raise the top half of your body and lift your shoulders. Do not use your back muscles. To make yourself more comfortable, you may change where you place your hands. 4. Stay in this position for 5 seconds. 5. Slowly return to lying flat on the floor.  Bridges  Do these steps 10 times in a row: 1. Lie on your back on a firm surface. 2. Bend your knees so they point up to the ceiling. Your feet should be flat on the floor. 3. Tighten your butt muscles and lift your butt off of the floor until your waist is almost as high as your knees. If you do not feel the muscles working in your butt and the back of your thighs, slide your feet 1-2 inches farther away from your butt. 4. Stay in this position for 3-5 seconds. 5. Slowly lower your butt to the floor, and let your butt muscles relax.  If this exercise is too easy, try doing it with your arms crossed over your chest. Belly Crunches  Do these steps 5-10 times in   a row: 1. Lie on your back on a firm bed or the floor with your legs stretched out. 2. Bend your knees so they point up to the ceiling. Your feet should be flat on the floor. 3. Cross your arms over your chest. 4. Tip your chin a little bit toward your chest but do not bend your neck. 5. Tighten your belly muscles and slowly raise your chest just enough to lift your shoulder blades a tiny bit off of the floor. 6. Slowly lower your chest and your head to the floor.  Back Lifts Do these steps 5-10 times in a row: 1. Lie on your belly (face-down) with your arms at your sides, and rest your forehead on the floor. 2. Tighten the muscles in your legs and your butt. 3. Slowly lift your chest off of the floor while you keep your hips on the floor. Keep the back of your head in line with the curve in your back. Look at  the floor while you do this. 4. Stay in this position for 3-5 seconds. 5. Slowly lower your chest and your face to the floor.  Contact a doctor if:  Your back pain gets a lot worse when you do an exercise.  Your back pain does not lessen 2 hours after you exercise. If you have any of these problems, stop doing the exercises. Do not do them again unless your doctor says it is okay. Get help right away if:  You have sudden, very bad back pain. If this happens, stop doing the exercises. Do not do them again unless your doctor says it is okay. This information is not intended to replace advice given to you by your health care provider. Make sure you discuss any questions you have with your health care provider. Document Released: 03/04/2010 Document Revised: 07/08/2015 Document Reviewed: 03/26/2014 Elsevier Interactive Patient Education  2018 Else[employer] Inc[employer]  Back Pain, Adult Back pain is very common. The pain often gets better over time. The cause of back pain is usually not dangerous. Most people can learn to manage their back pain on their own. Follow these instructions at home: Watch your back pain for any changes. The following actions may help to lessen any pain you are feeling:  Stay active. Start with short walks on flat ground if you can. Try to walk farther each day.  Exercise regularly as told by your doctor. Exercise helps your back heal faster. It also helps avoid future injury by keeping your muscles strong and flexible.  Do not sit, drive, or stand in one place for more than 30 minutes.  Do not stay in bed. Resting more than 1-2 days can slow down your recovery.  Be careful when you bend or lift an object. Use good form when lifting: ? Bend at your knees. ? Keep the object close to your body. ? Do not twist.  Sleep on a firm mattress. Lie on your side, and bend your knees. If you lie on your back, put a pillow under your knees.  Take medicines only as told by your  doctor.  Put ice on the injured area. ? Put ice in a plastic bag. ? Place a towel between your skin and the bag. ? Leave the ice on for 20 minutes, 2-3 times a day for the first 2-3 days. After that, you can switch between ice and heat packs.  Avoid feeling anxious or stressed. Find good ways to deal with stress, such as exercise.    Maintain a healthy weight. Extra weight puts stress on your back.  Contact a doctor if:  You have pain that does not go away with rest or medicine.  You have worsening pain that goes down into your legs or buttocks.  You have pain that does not get better in one week.  You have pain at night.  You lose weight.  You have a fever or chills. Get help right away if:  You cannot control when you poop (bowel movement) or pee (urinate).  Your arms or legs feel weak.  Your arms or legs lose feeling (numbness).  You feel sick to your stomach (nauseous) or throw up (vomit).  You have belly (abdominal) pain.  You feel like you may pass out (faint). This information is not intended to replace advice given to you by your health care provider. Make sure you discuss any questions you have with your health care provider. Document Released: 07/19/2007 Document Revised: 07/08/2015 Document Reviewed: 06/03/2013 Elsevier Interactive Patient Education  2018 Elsevier Inc.  

## 2017-12-09 ENCOUNTER — Encounter: Payer: Self-pay | Admitting: Internal Medicine

## 2017-12-09 NOTE — Progress Notes (Addendum)
Subjective:     Patient ID: Michelle Hicks , female    DOB: 1950/11/19 , 67 y.o.   MRN: [id number]   Chief Complaint  Patient presents with  . Hypertension  . Diabetes    HPI  Hypertension  This is a chronic problem. The current episode started more than 1 year ago. The problem has been gradually improving since onset. The problem is controlled. Risk factors for coronary artery disease include diabetes mellitus, dyslipidemia, obesity and sedentary lifestyle.  Diabetes  She presents for her follow-up diabetic visit. She has type 2 diabetes mellitus. There are no hypoglycemic associated symptoms. There are no hypoglycemic complications. Symptoms are stable.     Past Medical History:  Diagnosis Date  . Depression       Current Outpatient Medications:  .  aspirin 81 MG tablet, Take 81 mg by mouth daily., Disp: , Rfl:  .  Blood Glucose Monitoring Suppl (ACCU-CHEK AVIVA PLUS) W/DEVICE KIT, by Does not apply route., Disp: , Rfl:  .  Cholecalciferol (VITAMIN D-3) 1000 units CAPS, Take 1 capsule by mouth daily., Disp: , Rfl:  .  FLUoxetine (PROZAC) 20 MG capsule, Take 20 mg by mouth daily., Disp: , Rfl:  .  levothyroxine (SYNTHROID, LEVOTHROID) 25 MCG tablet, Take 25 mcg by mouth daily., Disp: , Rfl:  .  meloxicam (MOBIC) 15 MG tablet, Take 15 mg by mouth daily., Disp: , Rfl:  .  Multiple Vitamin (MULTIVITAMIN) tablet, Take 1 tablet by mouth daily., Disp: , Rfl:  .  omeprazole (PRILOSEC) 40 MG capsule, Take 40 mg by mouth daily., Disp: , Rfl:  .  pravastatin (PRAVACHOL) 40 MG tablet, Take 40 mg by mouth daily., Disp: , Rfl:  .  Probiotic Product (PROBIOTIC DAILY PO), Take by mouth., Disp: , Rfl:  .  Saxagliptin-Metformin 2.06-998 MG TB24, Take by mouth., Disp: , Rfl:  .  telmisartan-hydrochlorothiazide (MICARDIS HCT) 80-25 MG tablet, telmisartan 80 mg-hydrochlorothiazide 25 mg tablet, Disp: 90 tablet, Rfl: 1   Allergies  Allergen Reactions  . Sulfa Antibiotics      Review of  Systems  Constitutional: Negative.   HENT: Negative.   Respiratory: Negative.   Cardiovascular: Negative.   Gastrointestinal: Negative.   Musculoskeletal: Positive for back pain (She c/o LBP. This is chronic. Described as dull, throbbing pain. NO LE weakness/paresthesias.).  Neurological: Negative.   Psychiatric/Behavioral: Negative.      Today's Vitals   12/06/17 0943  BP: 130/76  Pulse: 90  Temp: 98.4 F (36.9 C)  TempSrc: Oral  Weight: 251 lb 6.4 oz (114 kg)  Height: 5' 3.5" (1.613 m)   Body mass index is 43.83 kg/m.   Objective:  Physical Exam  Constitutional: She is oriented to person, place, and time. She appears well-developed and well-nourished.  HENT:  Head: Normocephalic and atraumatic.  Eyes: EOM are normal.  Cardiovascular: Normal rate, regular rhythm and normal heart sounds.  Pulmonary/Chest: Effort normal and breath sounds normal.  Neurological: She is alert and oriented to person, place, and time.  Skin: Skin is warm and dry.  Psychiatric: She has a normal mood and affect.  Nursing note and vitals reviewed.       Assessment And Plan:     1. Essential hypertension, benign  IMPROVED CONTROL. SHE WAS SWITCHED TO TELMISARTAN FROM LOSARTAN AT HER LAST VISIT. SHE HAS TOLERATED MEDICATION WITHOUT ANY SIDE EFFECTS. SHE IS ENCOURAGED TO FOLLOW HER BP READINGS AT HOME. SHE IS ALSO ADVISED TO BRING IN HER BP MONITOR TO HER  NEXT VISIT.  - CMP14+EGFR  2. Uncontrolled type 2 diabetes mellitus with hyperglycemia (HCC)  IMPORTANCE OF REGULAR EXERCISE WAS DISCUSSED WITH THE PATIENT. SHE IS ENCOURAGED TO WALK AT LEAST 30 MINUTES FOUR TO FIVE DAYS WEEKLY. SHE IS ALSO ENCOURAGED TO AVOID SUGARY BEVERAGES AND PROCESSED FOODS.  - CMP14+EGFR - Hemoglobin A1c  3. Chronic bilateral low back pain without sciatica  CHRONIC. SHE IS ENCOURAGED TO PERFORM STRETCHING EXERCISES. SHE WOULD LIKE REFERRAL TO [facility]; HOWEVER, DOES NOT WANT TO [place] UNTIL January.  SHE WOULD LIKELY  BENEFIT FROM DRY NEEDLING AS WELL. SHE IS ENCOURAGED TO CONTACT ME IF SHE DECIDES TO GO SOONER.   4. Class 3 severe obesity due to excess calories with serious comorbidity and body mass index (BMI) of 40.0 to 44.9 in adult ([place])  SHE IS ENCOURAGED TO STRIVE FOR BMI LESS THAN 35 TO DECREASE CARDIAC RISK. AGAIN, SHE IS ENCOURAGED TO EXERCISE NO LESS THAN 150 MINUTES PER WEEK AND TO REMOVE REFINED CARBS FROM HER DIET. SHE DOES NOT WISH TO START A FORMAL WEIGHT LOSS PROGRAM AT THIS TIME.  [name], MD

## 2017-12-09 NOTE — Progress Notes (Signed)
Here are your lab results:  Your kidney function is stable. Your liver enzymes are elevated. Higher than last visit. I think this is likely due to fatty liver. It IS IMPORTANT that you increase your exercise, avoid ALL sugary beverages and processed/packaged foods.   Your hba1c is 7.8, up from last visit. Please make necessary lifestyle changes.   Sincerely,    Jasalyn Frysinger N. Baird Cancer, MD

## 2017-12-10 ENCOUNTER — Other Ambulatory Visit: Payer: Self-pay | Admitting: Internal Medicine

## 2017-12-11 ENCOUNTER — Ambulatory Visit: Payer: 59

## 2017-12-18 ENCOUNTER — Other Ambulatory Visit: Payer: Self-pay

## 2017-12-18 MED ORDER — AMLODIPINE BESYLATE 2.5 MG PO TABS: ORAL_TABLET | ORAL | 1 refills | Status: DC

## 2017-12-24 ENCOUNTER — Other Ambulatory Visit: Payer: Self-pay

## 2017-12-24 NOTE — Telephone Encounter (Signed)
Left the pt a message that she is to start the amlodipine 2.g mg that Dr. Baird Cancer sent to the pharmacy because of the bp readings that the pt sent in over the [employer] pt portal.

## 2017-12-25 ENCOUNTER — Other Ambulatory Visit: Payer: Self-pay

## 2017-12-25 MED ORDER — ONETOUCH SURESOFT LANCING DEV MISC: 2 refills | Status: DC

## 2017-12-26 MED FILL — AMLODIPINE 2.5 MG TABLET: 2.5 | 30 days supply | Qty: 30 | Fill #0

## 2018-01-11 MED FILL — TELMISARTAN-HCTZ 80-25 MG T: 80-25 | 30 days supply | Qty: 30 | Fill #1

## 2018-01-15 ENCOUNTER — Other Ambulatory Visit: Payer: Self-pay

## 2018-01-17 ENCOUNTER — Ambulatory Visit
Admission: RE | Admit: 2018-01-17 | Discharge: 2018-01-17 | Disposition: A | Discharge status: Home / Self Care | Payer: 59 | Source: Ambulatory Visit | Attending: Internal Medicine | Admitting: Internal Medicine

## 2018-01-17 DIAGNOSIS — Z1231 Encounter for screening mammogram for malignant neoplasm of breast: Secondary | ICD-10-CM

## 2018-02-11 MED FILL — TELMISARTAN-HCTZ 80-25 MG T: 80-25 | 30 days supply | Qty: 30 | Fill #2

## 2018-02-28 ENCOUNTER — Ambulatory Visit (INDEPENDENT_AMBULATORY_CARE_PROVIDER_SITE_OTHER): Payer: 59 | Admitting: Internal Medicine

## 2018-02-28 ENCOUNTER — Encounter: Payer: Self-pay | Admitting: Internal Medicine

## 2018-02-28 VITALS — BP 152/90 | HR 77 | Temp 98.1°F | Ht 63.0 in | Wt 250.8 lb

## 2018-02-28 DIAGNOSIS — E1165 Type 2 diabetes mellitus with hyperglycemia: Secondary | ICD-10-CM

## 2018-02-28 DIAGNOSIS — L304 Erythema intertrigo: Secondary | ICD-10-CM

## 2018-02-28 DIAGNOSIS — I10 Essential (primary) hypertension: Secondary | ICD-10-CM | POA: Diagnosis not present

## 2018-02-28 DIAGNOSIS — Z0000 Encounter for general adult medical examination without abnormal findings: Secondary | ICD-10-CM

## 2018-02-28 DIAGNOSIS — E2839 Other primary ovarian failure: Secondary | ICD-10-CM

## 2018-02-28 LAB — POCT URINALYSIS DIPSTICK
Bilirubin, UA: NEGATIVE
Blood, UA: NEGATIVE
Glucose, UA: NEGATIVE
Ketones, UA: NEGATIVE
LEUKOCYTES UA: NEGATIVE
Nitrite, UA: NEGATIVE
Protein, UA: NEGATIVE
Spec Grav, UA: 1.02 (ref 1.010–1.025)
Urobilinogen, UA: 0.2 E.U./dL
pH, UA: 6 (ref 5.0–8.0)

## 2018-02-28 LAB — POCT UA - MICROALBUMIN
Albumin/Creatinine Ratio, Urine, POC: 30
CREATININE, POC: 200 mg/dL
MICROALBUMIN (UR) POC: 30 mg/L

## 2018-02-28 MED ORDER — PNEUMOCOCCAL 13-VAL CONJ VACC IM SUSP: 0.5000 mL | INTRAMUSCULAR | 0 refills | Status: AC

## 2018-02-28 NOTE — Patient Instructions (Signed)

## 2018-02-28 NOTE — Progress Notes (Signed)
Subjective:     Patient ID: Michelle Hicks , female    DOB: [birth year] , 68 y.o.   MRN: [id number]   Chief Complaint  Patient presents with  . Annual Exam  . Diabetes  . Hypertension    HPI  She is here today for a full physical examination. She is followed by Dr. Raphael Gibney for her pelvic exams. She reports she has had a hysterectomy in the past.   Diabetes  She presents for her follow-up diabetic visit. She has type 2 diabetes mellitus. Her disease course has been fluctuating. There are no hypoglycemic associated symptoms. Pertinent negatives for diabetes include no blurred vision and no chest pain. There are no hypoglycemic complications. Risk factors for coronary artery disease include diabetes mellitus, dyslipidemia, hypertension, obesity, sedentary lifestyle and post-menopausal. Her weight is stable. She is following a generally healthy diet. Her breakfast blood glucose is taken between 8-9 am. Her breakfast blood glucose range is generally 110-130 mg/dl. Eye exam is current.  Hypertension  This is a chronic problem. The current episode started more than 1 year ago. The problem has been waxing and waning since onset. The problem is uncontrolled. Pertinent negatives include no blurred vision, chest pain, palpitations or shortness of breath.   She reports compliance with meds. Feels that her BP is elevated b/c she has been taking cough/cold meds over the past week.   Past Medical History:  Diagnosis Date  . Depression      Family History  Problem Relation Age of Onset  . Diabetes Mother   . Pancreatic cancer Mother   . Hypertension Father   . Heart attack Father      Current Outpatient Medications:  .  aspirin 81 MG tablet, Take 81 mg by mouth daily., Disp: , Rfl:  .  Blood Glucose Monitoring Suppl (ACCU-CHEK AVIVA PLUS) W/DEVICE KIT, by Does not apply route., Disp: , Rfl:  .  Cholecalciferol (VITAMIN D-3) 1000 units CAPS, Take 1 capsule by mouth daily., Disp: , Rfl:  .   DM-Doxylamine-Acetaminophen (NYQUIL COLD & FLU PO), Take by mouth., Disp: , Rfl:  .  Fexofenadine HCl (MUCINEX ALLERGY PO), Take by mouth., Disp: , Rfl:  .  FLUoxetine (PROZAC) 20 MG capsule, Take 20 mg by mouth daily., Disp: , Rfl:  .  KOMBIGLYZE XR 2.06-998 MG TB24, TAKE 1 TABLET BY MOUTH  TWICE A DAY WITH MEALS, Disp: 180 tablet, Rfl: 1 .  Lancets Misc. (ONE TOUCH SURESOFT) MISC, Use as directed to check blood sugars 2 times per day dx e11.65, Disp: 300 each, Rfl: 2 .  levothyroxine (SYNTHROID, LEVOTHROID) 25 MCG tablet, Take 25 mcg by mouth daily., Disp: , Rfl:  .  meloxicam (MOBIC) 15 MG tablet, Take 15 mg by mouth daily., Disp: , Rfl:  .  Multiple Vitamin (MULTIVITAMIN) tablet, Take 1 tablet by mouth daily., Disp: , Rfl:  .  omeprazole (PRILOSEC) 40 MG capsule, Take 40 mg by mouth daily., Disp: , Rfl:  .  pravastatin (PRAVACHOL) 40 MG tablet, Take 40 mg by mouth daily., Disp: , Rfl:  .  Probiotic Product (PROBIOTIC DAILY PO), Take by mouth., Disp: , Rfl:  .  telmisartan-hydrochlorothiazide (MICARDIS HCT) 80-25 MG tablet, telmisartan 80 mg-hydrochlorothiazide 25 mg tablet, Disp: 90 tablet, Rfl: 1   Allergies  Allergen Reactions  . Sulfa Antibiotics      No LMP recorded. Patient has had a hysterectomy.. Negative for: breast discharge, breast lump(s), breast pain and breast self exam. Associated symptoms include abnormal vaginal  bleeding. Pertinent negatives include abnormal bleeding (hematology), anxiety, decreased libido, depression, difficulty falling sleep, dyspareunia, history of infertility, nocturia, sexual dysfunction, sleep disturbances, urinary incontinence, urinary urgency, vaginal discharge and vaginal itching. Diet regular.The patient states her exercise level is  minimal.  . The patient's tobacco use is:  Social History   Tobacco Use  Smoking Status Former Smoker  . Years: 15.00  . Types: Cigarettes  Smokeless Tobacco Never Used  Tobacco Comment   15 years ago  . She has  been exposed to passive smoke. The patient's alcohol use is:  Social History   Substance and Sexual Activity  Alcohol Use Yes   Comment: socially   She has had a hysterectomy.   Review of Systems  Constitutional: Negative.   HENT: Positive for postnasal drip and sinus pressure.   Eyes: Negative.  Negative for blurred vision.  Respiratory: Negative.  Negative for shortness of breath.   Cardiovascular: Negative.  Negative for chest pain and palpitations.  Gastrointestinal: Negative.   Endocrine: Negative.   Genitourinary: Negative.   Musculoskeletal: Negative.   Skin: Negative.   Allergic/Immunologic: Negative.   Neurological: Negative.   Hematological: Negative.   Psychiatric/Behavioral: Negative.  Negative for dysphoric mood.     Today's Vitals   02/28/18 0844  BP: (!) 152/90  Pulse: 77  Temp: 98.1 F (36.7 C)  TempSrc: Oral  SpO2: 98%  Weight: 250 lb 12.8 oz (113.8 kg)  Height: _0  (1.6 m)   Body mass index is 44.43 kg/m.   Objective:  Physical Exam Vitals signs and nursing note reviewed.  Constitutional:      Appearance: Normal appearance. She is obese.  HENT:     Head: Normocephalic and atraumatic.     Right Ear: Tympanic membrane, ear canal and external ear normal.     Left Ear: Tympanic membrane, ear canal and external ear normal.     Nose: Nose normal.     Mouth/Throat:     Mouth: Mucous membranes are moist.     Pharynx: Oropharynx is clear.  Eyes:     Extraocular Movements: Extraocular movements intact.     Conjunctiva/sclera: Conjunctivae normal.     Pupils: Pupils are equal, round, and reactive to light.  Neck:     Musculoskeletal: Normal range of motion and neck supple.  Cardiovascular:     Rate and Rhythm: Normal rate and regular rhythm.     Pulses:          Dorsalis pedis pulses are 2+ on the right side and 2+ on the left side.     Heart sounds: Normal heart sounds.  Pulmonary:     Effort: Pulmonary effort is normal.     Breath sounds:  Normal breath sounds.  Chest:     Breasts:        Right: Normal. No swelling, bleeding, inverted nipple, mass or nipple discharge.        Left: Normal. No swelling, bleeding, inverted nipple, mass or nipple discharge.  Abdominal:     General: Bowel sounds are normal.     Palpations: Abdomen is soft.     Tenderness: There is no abdominal tenderness. There is no guarding.  Genitourinary:    Comments: deferred Musculoskeletal: Normal range of motion.  Feet:     Right foot:     Protective Sensation: 5 sites tested. 5 sites sensed.     Left foot:     Protective Sensation: 5 sites tested. 5 sites sensed.  Skin:  Findings: Rash present.     Comments: There is some erythema/hyperpigmentation of groin area b/l  Neurological:     General: No focal deficit present.     Mental Status: She is alert and oriented to person, place, and time.  Psychiatric:        Mood and Affect: Mood normal.        Behavior: Behavior normal.         Assessment And Plan:     1. Routine general medical examination at health care facility  A full exam was performed.  Importance of monthly self breast exams was discussed with the patient.  PATIENT HAS BEEN ADVISED TO GET 30-45 MINUTES REGULAR EXERCISE NO LESS THAN FOUR TO FIVE DAYS PER WEEK - BOTH WEIGHTBEARING EXERCISES AND AEROBIC ARE RECOMMENDED.  SHE IS ADVISED TO FOLLOW A HEALTHY DIET WITH AT LEAST SIX FRUITS/VEGGIES PER DAY, DECREASE INTAKE OF RED MEAT, AND TO INCREASE FISH INTAKE TO TWO DAYS PER WEEK.  MEATS/FISH SHOULD NOT BE FRIED, BAKED OR BROILED IS PREFERABLE.  I SUGGEST WEARING SPF 50 SUNSCREEN ON EXPOSED PARTS AND ESPECIALLY WHEN IN THE DIRECT SUNLIGHT FOR AN EXTENDED PERIOD OF TIME.  PLEASE AVOID FAST FOOD RESTAURANTS AND INCREASE YOUR WATER INTAKE.  - CMP14+EGFR - CBC - Lipid panel - Hemoglobin A1c  2. Uncontrolled type 2 diabetes mellitus with hyperglycemia (HCC)  Diabetic foot exam was performed. I DISCUSSED WITH THE PATIENT AT LENGTH  REGARDING THE GOALS OF GLYCEMIC CONTROL AND POSSIBLE LONG-TERM COMPLICATIONS.  I  ALSO STRESSED THE IMPORTANCE OF COMPLIANCE WITH HOME GLUCOSE MONITORING, DIETARY RESTRICTIONS INCLUDING AVOIDANCE OF SUGARY DRINKS/PROCESSED FOODS,  ALONG WITH REGULAR EXERCISE.  I  ALSO STRESSED THE IMPORTANCE OF ANNUAL EYE EXAMS, SELF FOOT CARE AND COMPLIANCE WITH OFFICE VISITS.   3. Essential hypertension, malignant  Uncontrolled. She will continue with current meds for now. Her last bp reading was wnl. She is encouraged to use Coricidin HBP products for cough/cold symptoms. Importance of medication and dietary compliance was discussed with the patient.   - EKG 12-Lead  4. Intertrigo  She is advised to apply Gold Bond powder to affected area twice daily as needed. If she does not have any improvement in her symptoms, I will call in nystatin powder for her.   5. Estrogen deficiency  [facility]. She is encouraged to comply with calcium/vitamin D supplementation and to engage in weight-bearing exercises three days weekly.  - DG Bone Density; Future        [name], MD

## 2018-03-01 LAB — CMP14+EGFR
ALT: 52 IU/L — ABNORMAL HIGH (ref 0–32)
AST: 39 IU/L (ref 0–40)
Albumin/Globulin Ratio: 1.4 (ref 1.2–2.2)
Albumin: 4.4 g/dL (ref 3.6–4.8)
Alkaline Phosphatase: 73 IU/L (ref 39–117)
BUN/Creatinine Ratio: 13 (ref 12–28)
BUN: 8 mg/dL (ref 8–27)
Bilirubin Total: 0.3 mg/dL (ref 0.0–1.2)
CHLORIDE: 99 mmol/L (ref 96–106)
CO2: 24 mmol/L (ref 20–29)
Calcium: 9.5 mg/dL (ref 8.7–10.3)
Creatinine, Ser: 0.63 mg/dL (ref 0.57–1.00)
GFR calc Af Amer: 109 mL/min/{1.73_m2} (ref >59)
GFR calc non Af Amer: 94 mL/min/{1.73_m2} (ref >59)
Globulin, Total: 3.2 g/dL (ref 1.5–4.5)
Glucose: 110 mg/dL — ABNORMAL HIGH (ref 65–99)
Potassium: 3.9 mmol/L (ref 3.5–5.2)
Sodium: 140 mmol/L (ref 134–144)
Total Protein: 7.6 g/dL (ref 6.0–8.5)

## 2018-03-01 LAB — CBC
Hematocrit: 33 % — ABNORMAL LOW (ref 34.0–46.6)
Hemoglobin: 10.7 g/dL — ABNORMAL LOW (ref 11.1–15.9)
MCH: 26.7 pg (ref 26.6–33.0)
MCHC: 32.4 g/dL (ref 31.5–35.7)
MCV: 82 fL (ref 79–97)
Platelets: 287 10*3/uL (ref 150–450)
RBC: 4.01 x10E6/uL (ref 3.77–5.28)
RDW: 14 % (ref 11.7–15.4)
WBC: 8.1 10*3/uL (ref 3.4–10.8)

## 2018-03-01 LAB — HEMOGLOBIN A1C
Est. average glucose Bld gHb Est-mCnc: 160 mg/dL
Hgb A1c MFr Bld: 7.2 % — ABNORMAL HIGH (ref 4.8–5.6)

## 2018-03-01 LAB — LIPID PANEL
Chol/HDL Ratio: 3.1 ratio (ref 0.0–4.4)
Cholesterol, Total: 185 mg/dL (ref 100–199)
HDL: 60 mg/dL (ref >39)
LDL Calculated: 99 mg/dL (ref 0–99)
TRIGLYCERIDES: 128 mg/dL (ref 0–149)
VLDL Cholesterol Cal: 26 mg/dL (ref 5–40)

## 2018-03-11 MED FILL — TELMISARTAN-HCTZ 80-25 MG T: 80-25 | 30 days supply | Qty: 30 | Fill #3

## 2018-03-15 ENCOUNTER — Other Ambulatory Visit: Payer: Self-pay | Admitting: Internal Medicine

## 2018-04-13 MED FILL — TELMISARTAN-HCTZ 80-25 MG T: 80-25 | 30 days supply | Qty: 30 | Fill #4 | Status: TO

## 2018-04-26 ENCOUNTER — Other Ambulatory Visit: Payer: Self-pay

## 2018-04-26 MED ORDER — TELMISARTAN-HCTZ 80-25 MG PO TABS: ORAL_TABLET | ORAL | 2 refills | Status: DC

## 2018-04-28 ENCOUNTER — Encounter: Payer: Self-pay | Admitting: Internal Medicine

## 2018-05-01 ENCOUNTER — Other Ambulatory Visit: Payer: Self-pay

## 2018-05-01 MED ORDER — MELOXICAM 15 MG PO TABS: 15.0000 mg | ORAL_TABLET | Freq: Every day | ORAL | 0 refills | Status: DC | PRN

## 2018-05-01 MED ORDER — TELMISARTAN-HCTZ 80-25 MG PO TABS: ORAL_TABLET | ORAL | 2 refills | Status: DC

## 2018-05-01 MED ORDER — ONETOUCH ULTRA BLUE VI STRP: ORAL_STRIP | 2 refills | Status: DC

## 2018-05-01 MED ORDER — SAXAGLIPTIN-METFORMIN ER 2.5-1000 MG PO TB24: 1.0000 | ORAL_TABLET | Freq: Two times a day (BID) | ORAL | 2 refills | Status: DC

## 2018-05-09 ENCOUNTER — Other Ambulatory Visit: Payer: Self-pay

## 2018-05-09 MED ORDER — TELMISARTAN-HCTZ 80-25 MG PO TABS: ORAL_TABLET | ORAL | 2 refills | Status: DC

## 2018-05-13 ENCOUNTER — Other Ambulatory Visit: Payer: Self-pay | Admitting: Internal Medicine

## 2018-05-13 MED ORDER — HYDROCHLOROTHIAZIDE 25 MG PO TABS: 25.0000 mg | ORAL_TABLET | Freq: Every day | ORAL | 2 refills | Status: DC

## 2018-05-13 MED ORDER — TELMISARTAN 80 MG PO TABS: 80.0000 mg | ORAL_TABLET | Freq: Every day | ORAL | 2 refills | Status: DC

## 2018-06-11 ENCOUNTER — Encounter: Payer: Self-pay | Admitting: Internal Medicine

## 2018-06-11 ENCOUNTER — Other Ambulatory Visit: Payer: 59

## 2018-06-11 ENCOUNTER — Other Ambulatory Visit: Payer: Self-pay

## 2018-06-11 ENCOUNTER — Ambulatory Visit (INDEPENDENT_AMBULATORY_CARE_PROVIDER_SITE_OTHER): Payer: 59 | Admitting: Internal Medicine

## 2018-06-11 VITALS — BP 126/80 | HR 92 | Temp 98.2°F | Ht 64.4 in | Wt 247.8 lb

## 2018-06-11 DIAGNOSIS — E039 Hypothyroidism, unspecified: Secondary | ICD-10-CM | POA: Diagnosis not present

## 2018-06-11 DIAGNOSIS — Z6841 Body Mass Index (BMI) 40.0 and over, adult: Secondary | ICD-10-CM

## 2018-06-11 DIAGNOSIS — E1165 Type 2 diabetes mellitus with hyperglycemia: Secondary | ICD-10-CM | POA: Diagnosis not present

## 2018-06-11 DIAGNOSIS — I10 Essential (primary) hypertension: Secondary | ICD-10-CM | POA: Diagnosis not present

## 2018-06-11 NOTE — Progress Notes (Signed)
Subjective:     Patient ID: Michelle Hicks , female    DOB: 17-Sep-1950 , 68 y.o.   MRN: [id number]   Chief Complaint  Patient presents with  . Diabetes  . Hypertension    HPI  Diabetes  She presents for her follow-up diabetic visit. She has type 2 diabetes mellitus. Her disease course has been stable. There are no hypoglycemic associated symptoms. Pertinent negatives for diabetes include no blurred vision and no chest pain. There are no hypoglycemic complications. Symptoms are stable. Risk factors for coronary artery disease include diabetes mellitus, dyslipidemia, hypertension, post-menopausal, sedentary lifestyle and obesity. She is compliant with treatment most of the time. She is following a generally healthy diet. She participates in exercise intermittently. An ACE inhibitor/angiotensin II receptor blocker is being taken.  Hypertension  This is a chronic problem. The current episode started more than 1 year ago. The problem has been gradually improving since onset. The problem is controlled. Pertinent negatives include no blurred vision, chest pain, palpitations or shortness of breath. The current treatment provides moderate improvement.     Past Medical History:  Diagnosis Date  . Depression   . DM (diabetes mellitus) ([place])   . HTN (hypertension)      Family History  Problem Relation Age of Onset  . Diabetes Mother   . Pancreatic cancer Mother   . Hypertension Father   . Heart attack Father      Current Outpatient Medications:  .  FLUoxetine (PROZAC) 20 MG capsule, Take 20 mg by mouth daily., Disp: , Rfl:  .  hydrochlorothiazide (HYDRODIURIL) 25 MG tablet, Take 1 tablet (25 mg total) by mouth daily., Disp: 90 tablet, Rfl: 2 .  Lancets Misc. (ONE TOUCH SURESOFT) MISC, Use as directed to check blood sugars 2 times per day dx e11.65, Disp: 300 each, Rfl: 2 .  levothyroxine (SYNTHROID, LEVOTHROID) 25 MCG tablet, Take 25 mcg by mouth daily., Disp: , Rfl:  .  meloxicam  (MOBIC) 15 MG tablet, Take 1 tablet (15 mg total) by mouth daily as needed., Disp: 90 tablet, Rfl: 0 .  Multiple Vitamin (MULTIVITAMIN) tablet, Take 1 tablet by mouth daily., Disp: , Rfl:  .  omeprazole (PRILOSEC) 40 MG capsule, TAKE 1 CAPSULE BY MOUTH  EVERY DAY BEFORE A MEAL, Disp: 90 capsule, Rfl: 1 .  ONE TOUCH ULTRA TEST test strip, Use as directed to check blood sugars 2 times per day dx: e11.65, Disp: 300 each, Rfl: 2 .  pravastatin (PRAVACHOL) 40 MG tablet, Take 40 mg by mouth daily., Disp: , Rfl:  .  Probiotic Product (PROBIOTIC DAILY PO), Take by mouth., Disp: , Rfl:  .  Saxagliptin-Metformin (KOMBIGLYZE XR) 2.06-998 MG TB24, Take 1 tablet by mouth 2 (two) times daily with a meal., Disp: 180 tablet, Rfl: 2 .  telmisartan (MICARDIS) 80 MG tablet, Take 1 tablet (80 mg total) by mouth daily., Disp: 90 tablet, Rfl: 2 .  Cholecalciferol (VITAMIN D-3) 1000 units CAPS, Take 1 capsule by mouth daily., Disp: , Rfl:  .  DM-Doxylamine-Acetaminophen (NYQUIL COLD & FLU PO), Take by mouth., Disp: , Rfl:  .  Fexofenadine HCl (MUCINEX ALLERGY PO), Take by mouth., Disp: , Rfl:    Allergies  Allergen Reactions  . Sulfa Antibiotics      Review of Systems  Constitutional: Negative.   Eyes: Negative for blurred vision.  Respiratory: Negative.  Negative for shortness of breath.   Cardiovascular: Negative.  Negative for chest pain and palpitations.  Gastrointestinal: Negative.  Neurological: Negative.   Psychiatric/Behavioral: Negative.      Today's Vitals   06/11/18 0853  BP: 126/80  Pulse: 92  Temp: 98.2 F (36.8 C)  TempSrc: Oral  Weight: 247 lb 12.8 oz (112.4 kg)  Height: 5' 4.4" (1.636 m)  PainSc: 0-No pain   Body mass index is 42.01 kg/m.   Objective:  Physical Exam Vitals signs and nursing note reviewed.  Constitutional:      Appearance: Normal appearance.  HENT:     Head: Normocephalic and atraumatic.  Cardiovascular:     Rate and Rhythm: Normal rate and regular rhythm.      Heart sounds: Normal heart sounds.  Pulmonary:     Effort: Pulmonary effort is normal.     Breath sounds: Normal breath sounds.  Skin:    General: Skin is warm.  Neurological:     General: No focal deficit present.     Mental Status: She is alert.  Psychiatric:        Mood and Affect: Mood normal.        Behavior: Behavior normal.         Assessment And Plan:     1. Uncontrolled type 2 diabetes mellitus with hyperglycemia ([place])  I will refer her for diabetic eye exam. I will check labs as listed below. She is encouraged to incorporate more exercise into her daily routine. She is encouraged to walk 30 minutes five days weekly.   - Ambulatory referral to Ophthalmology - Hemoglobin A1c - BMP8+EGFR  2. Essential hypertension, malignant  Well controlled. She admits to taking her meds more regularly. She is encouraged to limit her intake of packaged foods which tend to be high in sodium.    3. Primary hypothyroidism  I will check thyroid panel and adjust meds as needed.  - TSH - T4, Free  4. Class 3 severe obesity due to excess calories with serious comorbidity and body mass index (BMI) of 40.0 to 44.9 in adult [facility])  Importance of achieving optimal weight to decrease risk of cardiovascular disease and cancers was discussed with the patient in full detail. She is encouraged to start slowly - start with 10 minutes twice daily at least three to four days per week and to gradually build to 30 minutes five days weekly. She was given tips to incorporate more activity into her daily routine - take stairs when possible, park farther away from her job, grocery stores, etc.    [name], MD    THE PATIENT IS ENCOURAGED TO PRACTICE SOCIAL DISTANCING DUE TO THE COVID-19 PANDEMIC.

## 2018-06-11 NOTE — Patient Instructions (Signed)
Diabetes Mellitus and Exercise Exercising regularly is important for your overall health, especially when you have diabetes (diabetes mellitus). Exercising is not only about losing weight. It has many other health benefits, such as increasing muscle strength and bone density and reducing body fat and stress. This leads to improved fitness, flexibility, and endurance, all of which result in better overall health. Exercise has additional benefits for people with diabetes, including:  Reducing appetite.  Helping to lower and control blood glucose.  Lowering blood pressure.  Helping to control amounts of fatty substances (lipids) in the blood, such as cholesterol and triglycerides.  Helping the body to respond better to insulin (improving insulin sensitivity).  Reducing how much insulin the body needs.  Decreasing the risk for heart disease by: ? Lowering cholesterol and triglyceride levels. ? Increasing the levels of good cholesterol. ? Lowering blood glucose levels. What is my activity plan? Your health care provider or certified diabetes educator can help you make a plan for the type and frequency of exercise (activity plan) that works for you. Make sure that you:  Do at least 150 minutes of moderate-intensity or vigorous-intensity exercise each week. This could be brisk walking, biking, or water aerobics. ? Do stretching and strength exercises, such as yoga or weightlifting, at least 2 times a week. ? Spread out your activity over at least 3 days of the week.  Get some form of physical activity every day. ? Do not go more than 2 days in a row without some kind of physical activity. ? Avoid being inactive for more than 30 minutes at a time. Take frequent breaks to walk or stretch.  Choose a type of exercise or activity that you enjoy, and set realistic goals.  Start slowly, and gradually increase the intensity of your exercise over time. What do I need to know about managing my  diabetes?   Check your blood glucose before and after exercising. ? If your blood glucose is 240 mg/dL (13.3 mmol/L) or higher before you exercise, check your urine for ketones. If you have ketones in your urine, do not exercise until your blood glucose returns to normal. ? If your blood glucose is 100 mg/dL (5.6 mmol/L) or lower, eat a snack containing 15-20 grams of carbohydrate. Check your blood glucose 15 minutes after the snack to make sure that your level is above 100 mg/dL (5.6 mmol/L) before you start your exercise.  Know the symptoms of low blood glucose (hypoglycemia) and how to treat it. Your risk for hypoglycemia increases during and after exercise. Common symptoms of hypoglycemia can include: ? Hunger. ? Anxiety. ? Sweating and feeling clammy. ? Confusion. ? Dizziness or feeling light-headed. ? Increased heart rate or palpitations. ? Blurry vision. ? Tingling or numbness around the mouth, lips, or tongue. ? Tremors or shakes. ? Irritability.  Keep a rapid-acting carbohydrate snack available before, during, and after exercise to help prevent or treat hypoglycemia.  Avoid injecting insulin into areas of the body that are going to be exercised. For example, avoid injecting insulin into: ? The arms, when playing tennis. ? The legs, when jogging.  Keep records of your exercise habits. Doing this can help you and your health care provider adjust your diabetes management plan as needed. Write down: ? Food that you eat before and after you exercise. ? Blood glucose levels before and after you exercise. ? The type and amount of exercise you have done. ? When your insulin is expected to peak, if you use   insulin. Avoid exercising at times when your insulin is peaking.  When you start a new exercise or activity, work with your health care provider to make sure the activity is safe for you, and to adjust your insulin, medicines, or food intake as needed.  Drink plenty of water while  you exercise to prevent dehydration or heat stroke. Drink enough fluid to keep your urine clear or pale yellow. Summary  Exercising regularly is important for your overall health, especially when you have diabetes (diabetes mellitus).  Exercising has many health benefits, such as increasing muscle strength and bone density and reducing body fat and stress.  Your health care provider or certified diabetes educator can help you make a plan for the type and frequency of exercise (activity plan) that works for you.  When you start a new exercise or activity, work with your health care provider to make sure the activity is safe for you, and to adjust your insulin, medicines, or food intake as needed. This information is not intended to replace advice given to you by your health care provider. Make sure you discuss any questions you have with your health care provider. Document Released: 04/22/2003 Document Revised: 08/10/2016 Document Reviewed: 07/12/2015 Elsevier Interactive Patient Education  2019 [employer]  

## 2018-06-12 ENCOUNTER — Ambulatory Visit: Payer: 59 | Admitting: Internal Medicine

## 2018-06-12 LAB — BMP8+EGFR
BUN/Creatinine Ratio: 15 (ref 12–28)
BUN: 10 mg/dL (ref 8–27)
CO2: 24 mmol/L (ref 20–29)
Calcium: 10.1 mg/dL (ref 8.7–10.3)
Chloride: 101 mmol/L (ref 96–106)
Creatinine, Ser: 0.68 mg/dL (ref 0.57–1.00)
GFR calc Af Amer: 106 mL/min/{1.73_m2} (ref >59)
GFR calc non Af Amer: 92 mL/min/{1.73_m2} (ref >59)
Glucose: 124 mg/dL — ABNORMAL HIGH (ref 65–99)
Potassium: 4.6 mmol/L (ref 3.5–5.2)
Sodium: 143 mmol/L (ref 134–144)

## 2018-06-12 LAB — HEMOGLOBIN A1C
Est. average glucose Bld gHb Est-mCnc: 154 mg/dL
Hgb A1c MFr Bld: 7 % — ABNORMAL HIGH (ref 4.8–5.6)

## 2018-06-12 LAB — T4, FREE: Free T4: 1.17 ng/dL (ref 0.82–1.77)

## 2018-06-12 LAB — TSH: TSH: 1.22 u[IU]/mL (ref 0.450–4.500)

## 2018-08-01 ENCOUNTER — Ambulatory Visit
Admission: RE | Admit: 2018-08-01 | Discharge: 2018-08-01 | Disposition: A | Discharge status: Home / Self Care | Payer: 59 | Source: Ambulatory Visit | Attending: Internal Medicine | Admitting: Internal Medicine

## 2018-08-01 ENCOUNTER — Other Ambulatory Visit: Payer: Self-pay

## 2018-08-01 ENCOUNTER — Other Ambulatory Visit: Payer: Self-pay | Admitting: Internal Medicine

## 2018-08-01 DIAGNOSIS — E2839 Other primary ovarian failure: Secondary | ICD-10-CM

## 2018-08-06 NOTE — Telephone Encounter (Signed)
Meloxicam refill 

## 2018-09-04 LAB — HM DIABETES EYE EXAM

## 2018-09-06 ENCOUNTER — Encounter: Payer: Self-pay | Admitting: Internal Medicine

## 2018-09-10 ENCOUNTER — Other Ambulatory Visit: Payer: Self-pay

## 2018-09-10 ENCOUNTER — Ambulatory Visit (INDEPENDENT_AMBULATORY_CARE_PROVIDER_SITE_OTHER): Payer: 59 | Admitting: Internal Medicine

## 2018-09-10 ENCOUNTER — Encounter: Payer: Self-pay | Admitting: Internal Medicine

## 2018-09-10 VITALS — BP 132/68 | HR 78 | Temp 98.2°F | Ht 64.4 in | Wt 244.6 lb

## 2018-09-10 DIAGNOSIS — L299 Pruritus, unspecified: Secondary | ICD-10-CM

## 2018-09-10 DIAGNOSIS — I10 Essential (primary) hypertension: Secondary | ICD-10-CM | POA: Diagnosis not present

## 2018-09-10 DIAGNOSIS — E1165 Type 2 diabetes mellitus with hyperglycemia: Secondary | ICD-10-CM

## 2018-09-10 DIAGNOSIS — Z6841 Body Mass Index (BMI) 40.0 and over, adult: Secondary | ICD-10-CM

## 2018-09-10 NOTE — Patient Instructions (Signed)
WALKING  Walking is a great form of exercise to increase your strength, endurance and overall fitness.  A walking program can help you start slowly and gradually build endurance as you go.  Everyone's ability is different, so each person's starting point will be different.  You do not have to follow them exactly.  The are just samples. You should simply find out what's right for you and stick to that program.   In the beginning, you'll start off walking 2-3 times a day for short distances.  As you get stronger, you'll be walking further at just 1-2 times per day.  A. You Can Walk For A Certain Length Of Time Each Day    Walk 5 minutes 3 times per day.  Increase 2 minutes every 2 days (3 times per day).  Work up to 25-30 minutes (1-2 times per day).   Example:   Day 1-2 5 minutes 3 times per day   Day 7-8 12 minutes 2-3 times per day   Day 13-14 25 minutes 1-2 times per day  B. You Can Walk For a Certain Distance Each Day     Distance can be substituted for time.    Example:   3 trips to mailbox (at road)   3 trips to corner of block   3 trips around the block  C. Go to local [employer] and use the track.    Walk for distance 20min around track      Please only do the exercises that your therapist has initialed and dated

## 2018-09-10 NOTE — Progress Notes (Signed)
Subjective:     Patient ID: Michelle Hicks , female    DOB: 1950/05/27 , 68 y.o.   MRN: [id number]   Chief Complaint  Patient presents with  . Diabetes  . Hypertension    HPI  Diabetes She presents for her follow-up diabetic visit. She has type 2 diabetes mellitus. Her disease course has been stable. There are no hypoglycemic associated symptoms. Pertinent negatives for diabetes include no blurred vision and no chest pain. There are no hypoglycemic complications. Symptoms are stable. Risk factors for coronary artery disease include diabetes mellitus, dyslipidemia, hypertension, post-menopausal, sedentary lifestyle and obesity. She is compliant with treatment most of the time. She is following a generally healthy diet. She participates in exercise intermittently. An ACE inhibitor/angiotensin II receptor blocker is being taken.  Hypertension This is a chronic problem. The current episode started more than 1 year ago. The problem has been gradually improving since onset. The problem is controlled. Pertinent negatives include no blurred vision, chest pain, palpitations or shortness of breath. The current treatment provides moderate improvement.     Past Medical History:  Diagnosis Date  . Depression   . DM (diabetes mellitus) ([place])   . HTN (hypertension)      Family History  Problem Relation Age of Onset  . Diabetes Mother   . Pancreatic cancer Mother   . Hypertension Father   . Heart attack Father      Current Outpatient Medications:  .  amLODipine (NORVASC) 2.5 MG tablet, , Disp: , Rfl: 1 .  Cholecalciferol (VITAMIN D-3) 1000 units CAPS, Take 1 capsule by mouth daily., Disp: , Rfl:  .  DM-Doxylamine-Acetaminophen (NYQUIL COLD & FLU PO), Take by mouth., Disp: , Rfl:  .  Fexofenadine HCl (MUCINEX ALLERGY PO), Take by mouth., Disp: , Rfl:  .  FLUoxetine (PROZAC) 20 MG capsule, Take 20 mg by mouth daily., Disp: , Rfl:  .  hydrochlorothiazide (HYDRODIURIL) 25 MG tablet, Take 1  tablet (25 mg total) by mouth daily., Disp: 90 tablet, Rfl: 2 .  Lancets Misc. (ONE TOUCH SURESOFT) MISC, Use as directed to check blood sugars 2 times per day dx e11.65, Disp: 300 each, Rfl: 2 .  levothyroxine (SYNTHROID, LEVOTHROID) 25 MCG tablet, Take 25 mcg by mouth daily., Disp: , Rfl:  .  meloxicam (MOBIC) 15 MG tablet, TAKE 1 TABLET BY MOUTH  DAILY AS NEEDED, Disp: 90 tablet, Rfl: 1 .  Multiple Vitamin (MULTIVITAMIN) tablet, Take 1 tablet by mouth daily., Disp: , Rfl:  .  omeprazole (PRILOSEC) 40 MG capsule, TAKE 1 CAPSULE BY MOUTH  EVERY DAY BEFORE A MEAL, Disp: 90 capsule, Rfl: 1 .  ONE TOUCH ULTRA TEST test strip, Use as directed to check blood sugars 2 times per day dx: e11.65, Disp: 300 each, Rfl: 2 .  pravastatin (PRAVACHOL) 40 MG tablet, Take 40 mg by mouth daily., Disp: , Rfl:  .  Probiotic Product (PROBIOTIC DAILY PO), Take by mouth., Disp: , Rfl:  .  Saxagliptin-Metformin (KOMBIGLYZE XR) 2.06-998 MG TB24, Take 1 tablet by mouth 2 (two) times daily with a meal., Disp: 180 tablet, Rfl: 2 .  telmisartan (MICARDIS) 80 MG tablet, Take 1 tablet (80 mg total) by mouth daily., Disp: 90 tablet, Rfl: 2   Allergies  Allergen Reactions  . Sulfa Antibiotics      Review of Systems  Constitutional: Negative.   Eyes: Negative for blurred vision.  Respiratory: Negative.  Negative for shortness of breath.   Cardiovascular: Negative.  Negative for chest  pain and palpitations.  Gastrointestinal: Negative.   Skin:       She c/o an itchy spot on her back. Unable to determine what triggers her symptoms. She is also unable to state which makes her sx go away. Denies back pain. Denies UE/LE weakness/paresthesias. Sometimes symptoms start in mid-back and radiate to left side.   Neurological: Negative.   Psychiatric/Behavioral: Negative.      Today's Vitals   09/10/18 0904  BP: 132/68  Pulse: 78  Temp: 98.2 F (36.8 C)  TempSrc: Oral  Weight: 244 lb 9.6 oz (110.9 kg)  Height: 5' 4.4"  (1.636 m)  PainSc: 0-No pain   Body mass index is 41.47 kg/m.   Objective:  Physical Exam Vitals signs and nursing note reviewed.  Constitutional:      Appearance: Normal appearance.  HENT:     Head: Normocephalic and atraumatic.  Cardiovascular:     Rate and Rhythm: Normal rate and regular rhythm.     Heart sounds: Normal heart sounds.  Pulmonary:     Effort: Pulmonary effort is normal.     Breath sounds: Normal breath sounds.  Skin:    General: Skin is warm.  Neurological:     General: No focal deficit present.     Mental Status: She is alert.  Psychiatric:        Mood and Affect: Mood normal.        Behavior: Behavior normal.         Assessment And Plan:     1. Uncontrolled type 2 diabetes mellitus with hyperglycemia (HCC)  We discussed the importance of regular exercise. I will check BMP and a1c today.   2. Essential hypertension, malignant  Fair control. She will continue with current meds. She is encouraged to increase exercise to four to five days weekly.   3. Pruritus  I will check her CBC and liver function. Her sx appear to be dermatomal. She was advised to look out for a rash - this could be prodromal symptoms of shingles.   4. Class 3 severe obesity due to excess calories with serious comorbidity and body mass index (BMI) of 40.0 to 44.9 in adult [facility])  Importance of achieving optimal weight to decrease risk of cardiovascular disease and cancers was discussed with the patient in full detail. She is encouraged to start slowly - start with 10 minutes twice daily at least three to four days per week and to gradually build to 30 minutes five days weekly. She was given tips to incorporate more activity into her daily routine - take stairs when possible, park farther away from her job, grocery stores, etc.    [name], MD    THE PATIENT IS ENCOURAGED TO PRACTICE SOCIAL DISTANCING DUE TO THE COVID-19 PANDEMIC.

## 2018-09-11 LAB — LIPID PANEL
Chol/HDL Ratio: 2.5 ratio (ref 0.0–4.4)
Cholesterol, Total: 167 mg/dL (ref 100–199)
HDL: 67 mg/dL
LDL Calculated: 82 mg/dL (ref 0–99)
Triglycerides: 88 mg/dL (ref 0–149)
VLDL Cholesterol Cal: 18 mg/dL (ref 5–40)

## 2018-09-11 LAB — CBC
Hematocrit: 34.5 % (ref 34.0–46.6)
Hemoglobin: 11.2 g/dL (ref 11.1–15.9)
MCH: 27.1 pg (ref 26.6–33.0)
MCHC: 32.5 g/dL (ref 31.5–35.7)
MCV: 84 fL (ref 79–97)
Platelets: 283 10*3/uL (ref 150–450)
RBC: 4.13 x10E6/uL (ref 3.77–5.28)
RDW: 13.9 % (ref 11.7–15.4)
WBC: 6.8 10*3/uL (ref 3.4–10.8)

## 2018-09-11 LAB — CMP14+EGFR
ALT: 69 [IU]/L — ABNORMAL HIGH (ref 0–32)
AST: 57 [IU]/L — ABNORMAL HIGH (ref 0–40)
Albumin/Globulin Ratio: 1.6 (ref 1.2–2.2)
Albumin: 4.7 g/dL (ref 3.8–4.8)
Alkaline Phosphatase: 72 [IU]/L (ref 39–117)
BUN/Creatinine Ratio: 11 — ABNORMAL LOW (ref 12–28)
BUN: 8 mg/dL (ref 8–27)
Bilirubin Total: 0.4 mg/dL (ref 0.0–1.2)
CO2: 23 mmol/L (ref 20–29)
Calcium: 10.2 mg/dL (ref 8.7–10.3)
Chloride: 98 mmol/L (ref 96–106)
Creatinine, Ser: 0.75 mg/dL (ref 0.57–1.00)
GFR calc Af Amer: 97 mL/min/{1.73_m2}
GFR calc non Af Amer: 84 mL/min/{1.73_m2}
Globulin, Total: 3 g/dL (ref 1.5–4.5)
Glucose: 107 mg/dL — ABNORMAL HIGH (ref 65–99)
Potassium: 4 mmol/L (ref 3.5–5.2)
Sodium: 141 mmol/L (ref 134–144)
Total Protein: 7.7 g/dL (ref 6.0–8.5)

## 2018-09-24 ENCOUNTER — Encounter: Payer: Self-pay | Admitting: Internal Medicine

## 2018-10-10 ENCOUNTER — Other Ambulatory Visit: Payer: Self-pay

## 2018-10-10 MED ORDER — OMEPRAZOLE 40 MG PO CPDR: DELAYED_RELEASE_CAPSULE | ORAL | 2 refills | Status: DC

## 2018-10-10 MED ORDER — PRAVASTATIN SODIUM 40 MG PO TABS: 40.0000 mg | ORAL_TABLET | Freq: Every day | ORAL | 2 refills | Status: DC

## 2018-10-31 ENCOUNTER — Encounter: Payer: Self-pay | Admitting: Internal Medicine

## 2018-12-10 ENCOUNTER — Other Ambulatory Visit: Payer: Self-pay | Admitting: Internal Medicine

## 2018-12-23 ENCOUNTER — Ambulatory Visit: Payer: Medicare Other | Admitting: Internal Medicine

## 2018-12-23 ENCOUNTER — Ambulatory Visit: Payer: 59 | Admitting: Nurse Practitioner

## 2019-01-04 ENCOUNTER — Other Ambulatory Visit: Payer: Self-pay | Admitting: Internal Medicine

## 2019-01-04 ENCOUNTER — Encounter: Payer: Self-pay | Admitting: Internal Medicine

## 2019-01-22 ENCOUNTER — Ambulatory Visit: Payer: 59 | Admitting: Nurse Practitioner

## 2019-03-03 ENCOUNTER — Encounter: Payer: 59 | Admitting: Internal Medicine

## 2019-03-18 ENCOUNTER — Ambulatory Visit (INDEPENDENT_AMBULATORY_CARE_PROVIDER_SITE_OTHER): Payer: Medicare Other | Admitting: Internal Medicine

## 2019-03-18 ENCOUNTER — Other Ambulatory Visit: Payer: Self-pay

## 2019-03-18 ENCOUNTER — Encounter: Payer: Self-pay | Admitting: Internal Medicine

## 2019-03-18 VITALS — BP 132/80 | HR 95 | Temp 98.6°F | Ht 64.0 in | Wt 244.6 lb

## 2019-03-18 DIAGNOSIS — Z0000 Encounter for general adult medical examination without abnormal findings: Secondary | ICD-10-CM | POA: Diagnosis not present

## 2019-03-18 DIAGNOSIS — I10 Essential (primary) hypertension: Secondary | ICD-10-CM

## 2019-03-18 DIAGNOSIS — Z79899 Other long term (current) drug therapy: Secondary | ICD-10-CM | POA: Diagnosis not present

## 2019-03-18 DIAGNOSIS — E049 Nontoxic goiter, unspecified: Secondary | ICD-10-CM

## 2019-03-18 DIAGNOSIS — Z0001 Encounter for general adult medical examination with abnormal findings: Secondary | ICD-10-CM

## 2019-03-18 DIAGNOSIS — E1165 Type 2 diabetes mellitus with hyperglycemia: Secondary | ICD-10-CM

## 2019-03-18 DIAGNOSIS — E785 Hyperlipidemia, unspecified: Secondary | ICD-10-CM | POA: Diagnosis not present

## 2019-03-18 DIAGNOSIS — H6123 Impacted cerumen, bilateral: Secondary | ICD-10-CM | POA: Diagnosis not present

## 2019-03-18 DIAGNOSIS — Z6841 Body Mass Index (BMI) 40.0 and over, adult: Secondary | ICD-10-CM

## 2019-03-18 LAB — POC URINALSYSI DIPSTICK (AUTOMATED)
Bilirubin, UA: NEGATIVE
Blood, UA: NEGATIVE
Glucose, UA: POSITIVE — AB
Ketones, UA: NEGATIVE
Leukocytes, UA: NEGATIVE
Nitrite, UA: NEGATIVE
Protein, UA: NEGATIVE
Spec Grav, UA: >=1.03 — AB (ref 1.010–1.025)
Urobilinogen, UA: 0.2 E.U./dL
pH, UA: 6.5 (ref 5.0–8.0)

## 2019-03-18 LAB — POCT UA - MICROALBUMIN
Albumin/Creatinine Ratio, Urine, POC: <30
Creatinine, POC: 100 mg/dL
Microalbumin Ur, POC: 30 mg/L

## 2019-03-18 NOTE — Progress Notes (Signed)
This visit occurred during the SARS-CoV-2 public health emergency.  Safety protocols were in place, including screening questions prior to the visit, additional usage of staff PPE, and extensive cleaning of exam room while observing appropriate contact time as indicated for disinfecting solutions.  Subjective:     Patient ID: Michelle Hicks , female    DOB: Feb 05, 1951 , 69 y.o.   MRN: [id number]   Chief Complaint  Patient presents with  . Annual Exam  . Diabetes  . Hypertension    HPI  She is here today for a full physical examination. She is followed by [facility] for her GYN exams. She was last seen in 2020.   Diabetes She presents for her follow-up diabetic visit. She has type 2 diabetes mellitus. Her disease course has been stable. There are no hypoglycemic associated symptoms. Pertinent negatives for diabetes include no blurred vision and no chest pain. There are no hypoglycemic complications. Symptoms are stable. Risk factors for coronary artery disease include diabetes mellitus, dyslipidemia, hypertension, post-menopausal, sedentary lifestyle and obesity. She is compliant with treatment most of the time. She is following a generally healthy diet. She participates in exercise intermittently. An ACE inhibitor/angiotensin II receptor blocker is being taken.  Hypertension This is a chronic problem. The current episode started more than 1 year ago. The problem has been gradually improving since onset. The problem is controlled. Pertinent negatives include no blurred vision, chest pain, palpitations or shortness of breath. The current treatment provides moderate improvement.     Past Medical History:  Diagnosis Date  . Depression   . DM (diabetes mellitus) ([place])   . HTN (hypertension)      Family History  Problem Relation Age of Onset  . Diabetes Mother   . Pancreatic cancer Mother   . Hypertension Father   . Heart attack Father      Current Outpatient Medications:  .   Cholecalciferol (VITAMIN D-3) 1000 units CAPS, Take 1 capsule by mouth daily., Disp: , Rfl:  .  FLUoxetine (PROZAC) 20 MG capsule, Take 20 mg by mouth daily., Disp: , Rfl:  .  hydrochlorothiazide (HYDRODIURIL) 25 MG tablet, TAKE 1 TABLET BY MOUTH  DAILY, Disp: 90 tablet, Rfl: 3 .  KOMBIGLYZE XR 2.06-998 MG TB24, TAKE 1 TABLET BY MOUTH  TWICE DAILY WITH MEALS, Disp: 180 tablet, Rfl: 3 .  Lancets Misc. (ONE TOUCH SURESOFT) MISC, Use as directed to check blood sugars 2 times per day dx e11.65, Disp: 300 each, Rfl: 2 .  levothyroxine (SYNTHROID, LEVOTHROID) 25 MCG tablet, Take 25 mcg by mouth daily., Disp: , Rfl:  .  meloxicam (MOBIC) 15 MG tablet, TAKE 1 TABLET BY MOUTH  DAILY AS NEEDED, Disp: 90 tablet, Rfl: 3 .  Multiple Vitamin (MULTIVITAMIN) tablet, Take 1 tablet by mouth daily., Disp: , Rfl:  .  omeprazole (PRILOSEC) 40 MG capsule, TAKE 1 CAPSULE BY MOUTH  EVERY DAY BEFORE A MEAL, Disp: 90 capsule, Rfl: 2 .  ONE TOUCH ULTRA TEST test strip, Use as directed to check blood sugars 2 times per day dx: e11.65, Disp: 300 each, Rfl: 2 .  pravastatin (PRAVACHOL) 40 MG tablet, Take 1 tablet (40 mg total) by mouth daily., Disp: 90 tablet, Rfl: 2 .  Probiotic Product (PROBIOTIC DAILY PO), Take by mouth., Disp: , Rfl:  .  telmisartan (MICARDIS) 80 MG tablet, TAKE 1 TABLET BY MOUTH  DAILY, Disp: 90 tablet, Rfl: 3   Allergies  Allergen Reactions  . Sulfa Antibiotics  Review of Systems  Constitutional: Negative.   HENT: Negative.   Eyes: Negative.  Negative for blurred vision.  Respiratory: Negative.  Negative for shortness of breath.   Cardiovascular: Negative.  Negative for chest pain and palpitations.  Endocrine: Negative.   Genitourinary: Negative.   Musculoskeletal: Negative.   Skin: Negative.   Allergic/Immunologic: Negative.   Neurological: Negative.   Hematological: Negative.   Psychiatric/Behavioral: Negative.      Today's Vitals   03/18/19 0856  BP: 132/80  Pulse: 95  Temp:  98.6 F (37 C)  TempSrc: Oral  Weight: 244 lb 9.6 oz (110.9 kg)  Height: 5' 4"  (1.626 m)   Body mass index is 41.99 kg/m.   Objective:  Physical Exam Vitals and nursing note reviewed.  Constitutional:      Appearance: Normal appearance.  HENT:     Head: Normocephalic and atraumatic.     Right Ear: Ear canal and external ear normal. There is impacted cerumen.     Left Ear: Ear canal and external ear normal. There is impacted cerumen.     Ears:     Comments: Wax is hard and impacted b/l    Nose:     Comments: Deferred, masked    Mouth/Throat:     Comments: Deferred, masked Eyes:     Extraocular Movements: Extraocular movements intact.     Conjunctiva/sclera: Conjunctivae normal.     Pupils: Pupils are equal, round, and reactive to light.  Neck:     Thyroid: Thyromegaly present.     Comments: r thyroid nodule Cardiovascular:     Rate and Rhythm: Normal rate and regular rhythm.     Pulses: Normal pulses.          Dorsalis pedis pulses are 2+ on the right side and 2+ on the left side.     Heart sounds: Normal heart sounds.  Pulmonary:     Effort: Pulmonary effort is normal.     Breath sounds: Normal breath sounds.  Chest:     Breasts: Tanner Score is 5.        Right: Normal.        Left: Normal.  Abdominal:     General: Bowel sounds are normal.     Palpations: Abdomen is soft.     Comments: Rounded, soft  Genitourinary:    Comments: deferred Musculoskeletal:        General: Normal range of motion.     Cervical back: Normal range of motion and neck supple.  Feet:     Right foot:     Protective Sensation: 5 sites tested. 5 sites sensed.     Skin integrity: Callus and dry skin present.     Toenail Condition: Right toenails are normal.     Left foot:     Protective Sensation: 5 sites tested. 5 sites sensed.     Skin integrity: Callus and dry skin present.     Toenail Condition: Left toenails are normal.     Comments: Dark toenails on left/right Skin:    General:  Skin is warm and dry.  Neurological:     General: No focal deficit present.     Mental Status: She is alert and oriented to person, place, and time.  Psychiatric:        Mood and Affect: Mood normal.        Behavior: Behavior normal.         Assessment And Plan:      1. Routine general medical examination  at health care facility  A full exam was performed.  Importance of monthly self breast exams was discussed with the patient. PATIENT IS ADVISED TO GET 30-45 MINUTES REGULAR EXERCISE NO LESS THAN FOUR TO FIVE DAYS PER WEEK - BOTH WEIGHTBEARING EXERCISES AND AEROBIC ARE RECOMMENDED.  SHE IS ADVISED TO FOLLOW A HEALTHY DIET WITH AT LEAST SIX FRUITS/VEGGIES PER DAY, DECREASE INTAKE OF RED MEAT, AND TO INCREASE FISH INTAKE TO TWO DAYS PER WEEK.  MEATS/FISH SHOULD NOT BE FRIED, BAKED OR BROILED IS PREFERABLE.  I SUGGEST WEARING SPF 50 SUNSCREEN ON EXPOSED PARTS AND ESPECIALLY WHEN IN THE DIRECT SUNLIGHT FOR AN EXTENDED PERIOD OF TIME.  PLEASE AVOID FAST FOOD RESTAURANTS AND INCREASE YOUR WATER INTAKE.  - CMP14+EGFR - CBC - Lipid panel - Hemoglobin A1c - TSH  2. Uncontrolled type 2 diabetes mellitus with hyperglycemia (HCC)  Diabetic foot exam was performed.  She reports her insurance no longer covers [place]. WE DISCUSSED STARTING OZEMPIC TO HELP HER ACHIEVE GLYCEMIC CONTROL. SHE WILL START WITH 0.25MG ONCE WEEKLY X 2 WEEKS, THEN 0.5MG ONCE WEEKLY X 2 WEEKS. SHE DENIES FAMILY HISTORY OF THYROID CANCER. SHE WILL RTO IN 4 WEEKS FOR RE-EVALUATION.  I DISCUSSED WITH THE PATIENT AT LENGTH REGARDING THE GOALS OF GLYCEMIC CONTROL AND POSSIBLE LONG-TERM COMPLICATIONS.  I  ALSO STRESSED THE IMPORTANCE OF COMPLIANCE WITH HOME GLUCOSE MONITORING, DIETARY RESTRICTIONS INCLUDING AVOIDANCE OF SUGARY DRINKS/PROCESSED FOODS,  ALONG WITH REGULAR EXERCISE.  I  ALSO STRESSED THE IMPORTANCE OF ANNUAL EYE EXAMS, SELF FOOT CARE AND COMPLIANCE WITH OFFICE VISITS.  - POCT Urinalysis Dipstick (Automated) - POCT  UA - Microalbumin  3. Essential hypertension, malignant  Chronic, fair control. Optimal BP control is less than 130/80. She will continue with current meds. She is encouraged to incorporate more exercise into her daily routine. EKG performed, NSR w/o acute changes.   - EKG 12-Lead  4. Bilateral impacted cerumen  AFTER OBTAINING VERBAL CONSENT, BOTH EARS WERE FLUSHED BY IRRIGATION. SHE TOLERATED PROCEDURE WELL WITHOUT ANY COMPLICATIONS. NO TM ABNORMALITIES WERE NOTED.  - Ear Lavage  5. Class 3 severe obesity due to excess calories with serious comorbidity and body mass index (BMI) of 40.0 to 44.9 in adult (HCC)  BMI 41. She is encouraged to aim for at least 150 minutes of exercise per week. Our goal is to lose at least ten percent of her body weight this year.   6. Goiter  I will refer her for thyroid u/s. Previous imaging results reviewed in detail during her visit.   - US THYROID; Future   [name], MD    THE PATIENT IS ENCOURAGED TO PRACTICE SOCIAL DISTANCING DUE TO THE COVID-19 PANDEMIC.

## 2019-03-18 NOTE — Patient Instructions (Signed)
Health Maintenance, Female Adopting a healthy lifestyle and getting preventive care are important in promoting health and wellness. Ask your health care provider about:  The right schedule for you to have regular tests and exams.  Things you can do on your own to prevent diseases and keep yourself healthy. What should I know about diet, weight, and exercise? Eat a healthy diet   Eat a diet that includes plenty of vegetables, fruits, low-fat dairy products, and lean protein.  Do not eat a lot of foods that are high in solid fats, added sugars, or sodium. Maintain a healthy weight Body mass index (BMI) is used to identify weight problems. It estimates body fat based on height and weight. Your health care provider can help determine your BMI and help you achieve or maintain a healthy weight. Get regular exercise Get regular exercise. This is one of the most important things you can do for your health. Most adults should:  Exercise for at least 150 minutes each week. The exercise should increase your heart rate and make you sweat (moderate-intensity exercise).  Do strengthening exercises at least twice a week. This is in addition to the moderate-intensity exercise.  Spend less time sitting. Even light physical activity can be beneficial. Watch cholesterol and blood lipids Have your blood tested for lipids and cholesterol at 67 years of age, then have this test every 5 years. Have your cholesterol levels checked more often if:  Your lipid or cholesterol levels are high.  You are older than 67 years of age.  You are at high risk for heart disease. What should I know about cancer screening? Depending on your health history and family history, you may need to have cancer screening at various ages. This may include screening for:  Breast cancer.  Cervical cancer.  Colorectal cancer.  Skin cancer.  Lung cancer. What should I know about heart disease, diabetes, and high blood  pressure? Blood pressure and heart disease  High blood pressure causes heart disease and increases the risk of stroke. This is more likely to develop in people who have high blood pressure readings, are of African descent, or are overweight.  Have your blood pressure checked: ? Every 3-5 years if you are 18-39 years of age. ? Every year if you are 40 years old or older. Diabetes Have regular diabetes screenings. This checks your fasting blood sugar level. Have the screening done:  Once every three years after age 40 if you are at a normal weight and have a low risk for diabetes.  More often and at a younger age if you are overweight or have a high risk for diabetes. What should I know about preventing infection? Hepatitis B If you have a higher risk for hepatitis B, you should be screened for this virus. Talk with your health care provider to find out if you are at risk for hepatitis B infection. Hepatitis C Testing is recommended for:  Everyone born from 1945 through 1965.  Anyone with known risk factors for hepatitis C. Sexually transmitted infections (STIs)  Get screened for STIs, including gonorrhea and chlamydia, if: ? You are sexually active and are younger than 67 years of age. ? You are older than 67 years of age and your health care provider tells you that you are at risk for this type of infection. ? Your sexual activity has changed since you were last screened, and you are at increased risk for chlamydia or gonorrhea. Ask your health care provider if   you are at risk.  Ask your health care provider about whether you are at high risk for HIV. Your health care provider may recommend a prescription medicine to help prevent HIV infection. If you choose to take medicine to prevent HIV, you should first get tested for HIV. You should then be tested every 3 months for as long as you are taking the medicine. Pregnancy  If you are about to stop having your period (premenopausal) and  you may become pregnant, seek counseling before you get pregnant.  Take 400 to 800 micrograms (mcg) of folic acid every day if you become pregnant.  Ask for birth control (contraception) if you want to prevent pregnancy. Osteoporosis and menopause Osteoporosis is a disease in which the bones lose minerals and strength with aging. This can result in bone fractures. If you are 65 years old or older, or if you are at risk for osteoporosis and fractures, ask your health care provider if you should:  Be screened for bone loss.  Take a calcium or vitamin D supplement to lower your risk of fractures.  Be given hormone replacement therapy (HRT) to treat symptoms of menopause. Follow these instructions at home: Lifestyle  Do not use any products that contain nicotine or tobacco, such as cigarettes, e-cigarettes, and chewing tobacco. If you need help quitting, ask your health care provider.  Do not use street drugs.  Do not share needles.  Ask your health care provider for help if you need support or information about quitting drugs. Alcohol use  Do not drink alcohol if: ? Your health care provider tells you not to drink. ? You are pregnant, may be pregnant, or are planning to become pregnant.  If you drink alcohol: ? Limit how much you use to 0-1 drink a day. ? Limit intake if you are breastfeeding.  Be aware of how much alcohol is in your drink. In the [place] one drink equals one 12 oz bottle of beer (355 mL), one 5 oz glass of wine (148 mL), or one 1 oz glass of hard liquor (44 mL). General instructions  Schedule regular health, dental, and eye exams.  Stay current with your vaccines.  Tell your health care provider if: ? You often feel depressed. ? You have ever been abused or do not feel safe at home. Summary  Adopting a healthy lifestyle and getting preventive care are important in promoting health and wellness.  Follow your health care provider's instructions about healthy  diet, exercising, and getting tested or screened for diseases.  Follow your health care provider's instructions on monitoring your cholesterol and blood pressure. This information is not intended to replace advice given to you by your health care provider. Make sure you discuss any questions you have with your health care provider. Document Revised: 01/23/2018 Document Reviewed: 01/23/2018 [employer] Patient Education  2020 [employer]  

## 2019-03-19 LAB — LIPID PANEL
Chol/HDL Ratio: 2.4 ratio (ref 0.0–4.4)
Cholesterol, Total: 161 mg/dL (ref 100–199)
HDL: 68 mg/dL (ref >39)
LDL Chol Calc (NIH): 78 mg/dL (ref 0–99)
Triglycerides: 78 mg/dL (ref 0–149)
VLDL Cholesterol Cal: 15 mg/dL (ref 5–40)

## 2019-03-19 LAB — CMP14+EGFR
ALT: 58 IU/L — ABNORMAL HIGH (ref 0–32)
AST: 49 IU/L — ABNORMAL HIGH (ref 0–40)
Albumin/Globulin Ratio: 1.5 (ref 1.2–2.2)
Albumin: 4.5 g/dL (ref 3.8–4.8)
Alkaline Phosphatase: 73 IU/L (ref 39–117)
BUN/Creatinine Ratio: 14 (ref 12–28)
BUN: 10 mg/dL (ref 8–27)
Bilirubin Total: 0.3 mg/dL (ref 0.0–1.2)
CO2: 22 mmol/L (ref 20–29)
Calcium: 10 mg/dL (ref 8.7–10.3)
Chloride: 102 mmol/L (ref 96–106)
Creatinine, Ser: 0.7 mg/dL (ref 0.57–1.00)
GFR calc Af Amer: 104 mL/min/{1.73_m2} (ref >59)
GFR calc non Af Amer: 91 mL/min/{1.73_m2} (ref >59)
Globulin, Total: 3 g/dL (ref 1.5–4.5)
Glucose: 123 mg/dL — ABNORMAL HIGH (ref 65–99)
Potassium: 4.4 mmol/L (ref 3.5–5.2)
Sodium: 142 mmol/L (ref 134–144)
Total Protein: 7.5 g/dL (ref 6.0–8.5)

## 2019-03-19 LAB — CBC
Hematocrit: 31.1 % — ABNORMAL LOW (ref 34.0–46.6)
Hemoglobin: 10.3 g/dL — ABNORMAL LOW (ref 11.1–15.9)
MCH: 26.9 pg (ref 26.6–33.0)
MCHC: 33.1 g/dL (ref 31.5–35.7)
MCV: 81 fL (ref 79–97)
Platelets: 266 10*3/uL (ref 150–450)
RBC: 3.83 x10E6/uL (ref 3.77–5.28)
RDW: 13.9 % (ref 11.7–15.4)
WBC: 7.5 10*3/uL (ref 3.4–10.8)

## 2019-03-19 LAB — HEMOGLOBIN A1C
Est. average glucose Bld gHb Est-mCnc: 151 mg/dL
Hgb A1c MFr Bld: 6.9 % — ABNORMAL HIGH (ref 4.8–5.6)

## 2019-03-19 LAB — TSH: TSH: 2.18 u[IU]/mL (ref 0.450–4.500)

## 2019-04-02 ENCOUNTER — Ambulatory Visit
Admission: RE | Admit: 2019-04-02 | Discharge: 2019-04-02 | Disposition: A | Discharge status: Home / Self Care | Payer: Medicare Other | Source: Ambulatory Visit | Attending: Internal Medicine | Admitting: Internal Medicine

## 2019-04-02 DIAGNOSIS — E041 Nontoxic single thyroid nodule: Secondary | ICD-10-CM | POA: Diagnosis not present

## 2019-04-02 DIAGNOSIS — E049 Nontoxic goiter, unspecified: Secondary | ICD-10-CM

## 2019-04-15 ENCOUNTER — Ambulatory Visit (INDEPENDENT_AMBULATORY_CARE_PROVIDER_SITE_OTHER): Payer: Medicare Other | Admitting: Internal Medicine

## 2019-04-15 ENCOUNTER — Other Ambulatory Visit: Payer: Self-pay

## 2019-04-15 ENCOUNTER — Encounter: Payer: Self-pay | Admitting: Internal Medicine

## 2019-04-15 VITALS — BP 124/82 | HR 84 | Temp 98.6°F | Ht 64.0 in | Wt 247.6 lb

## 2019-04-15 DIAGNOSIS — Z6841 Body Mass Index (BMI) 40.0 and over, adult: Secondary | ICD-10-CM | POA: Diagnosis not present

## 2019-04-15 DIAGNOSIS — K5903 Drug induced constipation: Secondary | ICD-10-CM

## 2019-04-15 DIAGNOSIS — E1165 Type 2 diabetes mellitus with hyperglycemia: Secondary | ICD-10-CM

## 2019-04-15 NOTE — Patient Instructions (Signed)
Gluteus Medius Syndrome Rehab Ask your health care provider which exercises are safe for you. Do exercises exactly as told by your health care provider and adjust them as directed. It is normal to feel mild stretching, pulling, tightness, or discomfort as you do these exercises. Stop right away if you feel sudden pain or your pain gets worse. Do not begin these exercises until told by your health care provider. Stretching and range-of-motion exercise This exercise warms up your muscles and joints and improves the movement and flexibility of your hip and pelvis. This exercise also helps to relieve muscle and joint pain and stiffness. Lunge This exercise is also called hip flexor stretch. 1. Kneel on the floor on your left / right knee. Bend your other knee so it is directly over your ankle. 2. Keep good posture with your head over your shoulders. Tuck your tailbone underneath you. This will prevent your back from arching too much. 3. You should feel a gentle stretch in the front of your back thigh or hip (hip flexors). If you do not feel a stretch, slowly lunge forward with your chest up. 4. Hold this position for __________ seconds. 5. Slowly return to the starting position. Repeat __________ times. Complete this exercise __________ times a day. Strengthening exercises These exercises build strength and endurance in your hip and pelvis. Endurance is the ability to use your muscles for a long time, even after they get tired. Bridge This exercise strengthens the muscles that move your thigh backward (hip extensors). 1. Lie on your back on a firm surface with your knees bent and your feet flat on the floor. 2. Tighten your buttocks muscles and lift your bottom off the floor until the trunk of your body is level with your thighs. ? You should feel the muscles working in your buttocks and the back of your thighs. If this exercise is too easy, cross your arms over your chest or lift one leg while your  bottom is up and off the floor. ? Do not arch your back. 3. Hold this position for __________ seconds. 4. Slowly lower your hips to the starting position. 5. Let your muscles relax completely after each repetition. Repeat __________ times. Complete this exercise __________ times a day. Straight leg raises, side-lying This exercise strengthens the muscles that rotate the leg at the hip and move it away from your body (hip abductors). 1. Lie on your side with your left / right leg in the top position. Lie so your head, shoulder, knee, and hip line up. Bend your bottom knee slightly to help you balance. 2. Lift your top leg 4-6 inches (10-15 cm) while keeping your toes pointed straight ahead. 3. Hold this position for __________ seconds. 4. Slowly lower your leg to the starting position. 5. Let your muscles relax completely after each repetition. Repeat __________ times. Complete this exercise __________ times a day. Hip abduction, quadruped This is an exercise in which your hands and knees are on the floor, and you lift one knee out to the side. 1. Get on your hands and knees on a firm, lightly padded surface. Your hands should be directly below your shoulders, and your knees should be directly below your hips. 2. Lift your left / right knee out to the side. Keep your knee bent. Do not twist your body. 3. Hold this position for __________ seconds. 4. Slowly lower your leg back to the starting position. Repeat __________ times. Complete this exercise __________ times a day. Single  leg stand 1. Stand near a counter or door frame. Hold on to it as needed. It is helpful to look in a mirror for this exercise so you can watch your hip. 2. Squeeze your left / right buttocks muscles, then lift up your other foot. Do not let your left / right hip push out to the side. 3. Hold this position for __________ seconds. Repeat __________ times. Complete this exercise __________ times a day. This information  is not intended to replace advice given to you by your health care provider. Make sure you discuss any questions you have with your health care provider. Document Revised: 05/23/2018 Document Reviewed: 11/28/2017 Elsevier Patient Education  [place].

## 2019-04-15 NOTE — Progress Notes (Signed)
This visit occurred during the SARS-CoV-2 public health emergency.  Safety protocols were in place, including screening questions prior to the visit, additional usage of staff PPE, and extensive cleaning of exam room while observing appropriate contact time as indicated for disinfecting solutions.  Subjective:     Patient ID: Michelle Hicks , female    DOB: 18-Sep-1950 , 69 y.o.   MRN: [id number]   Chief Complaint  Patient presents with  . Diabetes    Ozempic Fu    HPI  She is here today for diabetes check. She was started on Ozempic at her last visit. She is now taking 0.5mg  once weekly on Tuesdays. She has not had any issues with the medication.   Diabetes She presents for her follow-up diabetic visit. She has type 2 diabetes mellitus. There are no diabetic associated symptoms. Risk factors for coronary artery disease include diabetes mellitus, obesity, sedentary lifestyle and post-menopausal. She participates in exercise intermittently. Eye exam is current.     Past Medical History:  Diagnosis Date  . Depression   . DM (diabetes mellitus) ([place])   . HTN (hypertension)      Family History  Problem Relation Age of Onset  . Diabetes Mother   . Pancreatic cancer Mother   . Hypertension Father   . Heart attack Father      Current Outpatient Medications:  .  Cholecalciferol (VITAMIN D-3) 1000 units CAPS, Take 1 capsule by mouth daily., Disp: , Rfl:  .  FLUoxetine (PROZAC) 20 MG capsule, Take 20 mg by mouth daily., Disp: , Rfl:  .  hydrochlorothiazide (HYDRODIURIL) 25 MG tablet, TAKE 1 TABLET BY MOUTH  DAILY, Disp: 90 tablet, Rfl: 3 .  Lancets Misc. (ONE TOUCH SURESOFT) MISC, Use as directed to check blood sugars 2 times per day dx e11.65, Disp: 300 each, Rfl: 2 .  levothyroxine (SYNTHROID, LEVOTHROID) 25 MCG tablet, Take 25 mcg by mouth daily., Disp: , Rfl:  .  meloxicam (MOBIC) 15 MG tablet, TAKE 1 TABLET BY MOUTH  DAILY AS NEEDED, Disp: 90 tablet, Rfl: 3 .  Multiple Vitamin  (MULTIVITAMIN) tablet, Take 1 tablet by mouth daily., Disp: , Rfl:  .  omeprazole (PRILOSEC) 40 MG capsule, TAKE 1 CAPSULE BY MOUTH  EVERY DAY BEFORE A MEAL, Disp: 90 capsule, Rfl: 2 .  ONE TOUCH ULTRA TEST test strip, Use as directed to check blood sugars 2 times per day dx: e11.65, Disp: 300 each, Rfl: 2 .  pravastatin (PRAVACHOL) 40 MG tablet, Take 1 tablet (40 mg total) by mouth daily., Disp: 90 tablet, Rfl: 2 .  Probiotic Product (PROBIOTIC DAILY PO), Take by mouth., Disp: , Rfl:  .  telmisartan (MICARDIS) 80 MG tablet, TAKE 1 TABLET BY MOUTH  DAILY, Disp: 90 tablet, Rfl: 3 .  KOMBIGLYZE XR 2.06-998 MG TB24, TAKE 1 TABLET BY MOUTH  TWICE DAILY WITH MEALS (Patient not taking: Reported on 04/15/2019), Disp: 180 tablet, Rfl: 3   Allergies  Allergen Reactions  . Sulfa Antibiotics      Review of Systems  Constitutional: Negative.   Respiratory: Negative.   Cardiovascular: Negative.   Gastrointestinal: Positive for constipation.       She reports c/o constipation since started Ozempic. She has not tried any OTC meds for relief.   Neurological: Negative.   Psychiatric/Behavioral: Negative.      Today's Vitals   04/15/19 1441  BP: 124/82  Pulse: 84  Temp: 98.6 F (37 C)  Weight: 247 lb 9.6 oz (112.3 kg)  Height: 5\' 4"  (1.626 m)   Body mass index is 42.5 kg/m.   Objective:  Physical Exam Vitals and nursing note reviewed.  Constitutional:      Appearance: Normal appearance. She is obese.  HENT:     Head: Normocephalic and atraumatic.  Cardiovascular:     Rate and Rhythm: Normal rate and regular rhythm.     Heart sounds: Normal heart sounds.  Pulmonary:     Effort: Pulmonary effort is normal.     Breath sounds: Normal breath sounds.  Abdominal:     General: Bowel sounds are normal.     Palpations: Abdomen is soft.     Comments: Obese,   Skin:    General: Skin is warm.  Neurological:     General: No focal deficit present.     Mental Status: She is alert.  Psychiatric:         Mood and Affect: Mood normal.        Behavior: Behavior normal.         Assessment And Plan:     1. Uncontrolled type 2 diabetes mellitus with hyperglycemia ([place])  She is willing to take higher dose of Ozempic.  She will increase dose of Ozempic to 1mg  once weekly. She will inject 0.5mg  x 2 once weekly on Tuesdays. She is encouraged to let me know how she feels on higher dose.   2. Drug-induced constipation  She was advised to start taking stool softener daily. If persistent, she will start Miralax daily. She is encouraged to let me know how she is feeling next week.   3. Class 3 severe obesity due to excess calories with serious comorbidity and body mass index (BMI) of 40.0 to 44.9 in adult (HCC)  BMI 42. She is encouraged to strive for BMI less than 35 to decrease cardiac risk.  She is advised to increase her exercise to 30 minutes five days per week.   Michelle Greenland, MD    THE PATIENT IS ENCOURAGED TO PRACTICE SOCIAL DISTANCING DUE TO THE COVID-19 PANDEMIC.

## 2019-04-29 ENCOUNTER — Other Ambulatory Visit: Payer: Self-pay

## 2019-04-29 MED ORDER — OZEMPIC (1 MG/DOSE) 2 MG/1.5ML ~~LOC~~ SOPN: 1.0000 mg | PEN_INJECTOR | SUBCUTANEOUS | 1 refills | Status: DC

## 2019-06-27 ENCOUNTER — Other Ambulatory Visit: Payer: Self-pay | Admitting: Internal Medicine

## 2019-06-27 DIAGNOSIS — Z1231 Encounter for screening mammogram for malignant neoplasm of breast: Secondary | ICD-10-CM

## 2019-07-01 ENCOUNTER — Other Ambulatory Visit: Payer: Self-pay

## 2019-07-01 ENCOUNTER — Ambulatory Visit
Admission: RE | Admit: 2019-07-01 | Discharge: 2019-07-01 | Disposition: A | Discharge status: Home / Self Care | Payer: Medicare Other | Source: Ambulatory Visit | Attending: Internal Medicine | Admitting: Internal Medicine

## 2019-07-01 DIAGNOSIS — Z1231 Encounter for screening mammogram for malignant neoplasm of breast: Secondary | ICD-10-CM

## 2019-07-10 ENCOUNTER — Ambulatory Visit (INDEPENDENT_AMBULATORY_CARE_PROVIDER_SITE_OTHER): Payer: Medicare Other

## 2019-07-10 ENCOUNTER — Ambulatory Visit: Payer: Medicare Other

## 2019-07-10 ENCOUNTER — Encounter: Payer: Self-pay | Admitting: Internal Medicine

## 2019-07-10 ENCOUNTER — Ambulatory Visit (INDEPENDENT_AMBULATORY_CARE_PROVIDER_SITE_OTHER): Payer: Medicare Other | Admitting: Internal Medicine

## 2019-07-10 ENCOUNTER — Other Ambulatory Visit: Payer: Self-pay

## 2019-07-10 VITALS — BP 128/64 | HR 90 | Temp 98.1°F | Ht 63.4 in | Wt 244.8 lb

## 2019-07-10 DIAGNOSIS — Z0000 Encounter for general adult medical examination without abnormal findings: Secondary | ICD-10-CM | POA: Diagnosis not present

## 2019-07-10 DIAGNOSIS — I10 Essential (primary) hypertension: Secondary | ICD-10-CM

## 2019-07-10 DIAGNOSIS — K649 Unspecified hemorrhoids: Secondary | ICD-10-CM | POA: Diagnosis not present

## 2019-07-10 DIAGNOSIS — K5903 Drug induced constipation: Secondary | ICD-10-CM

## 2019-07-10 DIAGNOSIS — E1165 Type 2 diabetes mellitus with hyperglycemia: Secondary | ICD-10-CM

## 2019-07-10 DIAGNOSIS — Z6841 Body Mass Index (BMI) 40.0 and over, adult: Secondary | ICD-10-CM

## 2019-07-10 NOTE — Patient Instructions (Signed)

## 2019-07-10 NOTE — Progress Notes (Signed)
This visit occurred during the SARS-CoV-2 public health emergency.  Safety protocols were in place, including screening questions prior to the visit, additional usage of staff PPE, and extensive cleaning of exam room while observing appropriate contact time as indicated for disinfecting solutions.  Subjective:   Michelle Hicks is a 69 y.o. female who presents for an Initial Medicare Annual Wellness Visit.  Review of Systems    n/a  Cardiac Risk Factors include: advanced age (>39men, >26 women);diabetes mellitus;hypertension;obesity (BMI >30kg/m2)     Objective:    Today's Vitals   07/10/19 1001  BP: 128/64  Pulse: 90  Temp: 98.1 F (36.7 C)  TempSrc: Oral  SpO2: 98%  Weight: 244 lb 12.8 oz (111 kg)  Height: 5' 3.4" (1.61 m)   Body mass index is 42.82 kg/m.  Advanced Directives 07/10/2019  Does Patient Have a Medical Advance Directive? No  Would patient like information on creating a medical advance directive? Yes (MAU/Ambulatory/Procedural Areas - Information given)    Current Medications (verified) Outpatient Encounter Medications as of 07/10/2019  Medication Sig  . bisacodyl (DULCOLAX) 5 MG EC tablet Take 5 mg by mouth daily as needed for moderate constipation.  . Cholecalciferol (VITAMIN D-3) 1000 units CAPS Take 1 capsule by mouth daily.  Marland Kitchen FLUoxetine (PROZAC) 20 MG capsule Take 20 mg by mouth daily.  . hydrochlorothiazide (HYDRODIURIL) 25 MG tablet TAKE 1 TABLET BY MOUTH  DAILY  . Lancets Misc. (ONE TOUCH SURESOFT) MISC Use as directed to check blood sugars 2 times per day dx e11.65  . levothyroxine (SYNTHROID, LEVOTHROID) 25 MCG tablet Take 25 mcg by mouth daily.  . meloxicam (MOBIC) 15 MG tablet TAKE 1 TABLET BY MOUTH  DAILY AS NEEDED  . Multiple Vitamin (MULTIVITAMIN) tablet Take 1 tablet by mouth daily.  Marland Kitchen omeprazole (PRILOSEC) 40 MG capsule TAKE 1 CAPSULE BY MOUTH  EVERY DAY BEFORE A MEAL  . ONE TOUCH ULTRA TEST test strip Use as directed to check blood sugars  2 times per day dx: e11.65  . pravastatin (PRAVACHOL) 40 MG tablet Take 1 tablet (40 mg total) by mouth daily. (Patient taking differently: Take 40 mg by mouth daily. Patient takes 1/2 tablet (20 mg))  . Probiotic Product (PROBIOTIC DAILY PO) Take by mouth.  . Semaglutide, 1 MG/DOSE, (OZEMPIC, 1 MG/DOSE,) 2 MG/1.5ML SOPN Inject 1 mg into the skin once a week.  . telmisartan (MICARDIS) 80 MG tablet TAKE 1 TABLET BY MOUTH  DAILY  . KOMBIGLYZE XR 2.06-998 MG TB24 TAKE 1 TABLET BY MOUTH  TWICE DAILY WITH MEALS (Patient not taking: Reported on 04/15/2019)   No facility-administered encounter medications on file as of 07/10/2019.    Allergies (verified) Sulfa antibiotics   History: Past Medical History:  Diagnosis Date  . Depression   . DM (diabetes mellitus) ([place])   . HTN (hypertension)    Past Surgical History:  Procedure Laterality Date  . ABDOMINAL HYSTERECTOMY    . TONSILLECTOMY Bilateral 1967   Family History  Problem Relation Age of Onset  . Diabetes Mother   . Pancreatic cancer Mother   . Hypertension Father   . Heart attack Father    Social History   Socioeconomic History  . Marital status: Married    Spouse name: Not on file  . Number of children: Not on file  . Years of education: Not on file  . Highest education level: Not on file  Occupational History  . Occupation: retired  Tobacco Use  . Smoking status: Former  Smoker    Packs/day: 0.50    Years: 15.00    Pack years: 7.50    Types: Cigarettes  . Smokeless tobacco: Never Used  . Tobacco comment: 20 years ago  Substance and Sexual Activity  . Alcohol use: Not Currently    Comment: socially   . Drug use: No  . Sexual activity: Yes    Partners: Male    Birth control/protection: Surgical  Other Topics Concern  . Not on file  Social History Narrative  . Not on file   Social Determinants of Health   Financial Resource Strain: Low Risk   . Difficulty of Paying Living Expenses: Not hard at all  Food  Insecurity: No Food Insecurity  . Worried About Charity fundraiser in the Last Year: Never true  . Ran Out of Food in the Last Year: Never true  Transportation Needs: No Transportation Needs  . Lack of Transportation (Medical): No  . Lack of Transportation (Non-Medical): No  Physical Activity: Insufficiently Active  . Days of Exercise per Week: 3 days  . Minutes of Exercise per Session: 30 min  Stress: No Stress Concern Present  . Feeling of Stress : Not at all  Social Connections:   . Frequency of Communication with Friends and Family:   . Frequency of Social Gatherings with Friends and Family:   . Attends Religious Services:   . Active Member of Clubs or Organizations:   . Attends Archivist Meetings:   Marland Kitchen Marital Status:     Tobacco Counseling Counseling given: Not Answered Comment: 20 years ago   Clinical Intake:  Pre-visit preparation completed: Yes  Pain : No/denies pain     Nutritional Status: BMI > 30  Obese Nutritional Risks: None Diabetes: Yes  How often do you need to have someone help you when you read instructions, pamphlets, or other written materials from your doctor or pharmacy?: 1 - Never What is the last grade level you completed in school?: college  Interpreter Needed?: No  Information entered by :: NAllen LPN   Activities of Daily Living In your present state of health, do you have any difficulty performing the following activities: 07/10/2019 09/10/2018  Hearing? N Y  Vision? Y N  Comment sometimes -  Difficulty concentrating or making decisions? N N  Walking or climbing stairs? N N  Dressing or bathing? N N  Doing errands, shopping? N N  Preparing Food and eating ? N -  Using the Toilet? N -  In the past six months, have you accidently leaked urine? N -  Do you have problems with loss of bowel control? N -  Managing your Medications? N -  Managing your Finances? N -  Housekeeping or managing your Housekeeping? N -  Some recent  data might be hidden     Immunizations and Health Maintenance Immunization History  Administered Date(s) Administered  . DTaP 02/28/2012  . Influenza, High Dose Seasonal PF 11/14/2017, 10/30/2018  . Influenza-Unspecified 11/14/2017, 10/30/2018  . Moderna SARS-COVID-2 Vaccination 03/28/2019, 04/25/2019  . Pneumococcal Conjugate-13 04/12/2018  . Pneumococcal-Unspecified 08/06/2013  . Zoster Recombinat (Shingrix) 11/14/2018   There are no preventive care reminders to display for this patient.  Patient Care Team: Glendale Chard, MD as PCP - General (Internal Medicine)  Indicate any recent Medical Services you may have received from other than [facility] providers in the past year (date may be approximate).     Assessment:   This is a routine wellness examination for Ashleyn.  Hearing/Vision screen  Hearing Screening   125Hz  250Hz  500Hz  1000Hz  2000Hz  3000Hz  4000Hz  6000Hz  8000Hz   Right ear:           Left ear:           Vision Screening Comments: REGULAR EYE EXAMS. Dr. Katy Fitch  Dietary issues and exercise activities discussed: Current Exercise Habits: Home exercise routine, Type of exercise: walking, Time (Minutes): 30, Frequency (Times/Week): 3, Weekly Exercise (Minutes/Week): 90  Goals    . Patient Stated     07/10/2019, wants to get to 235 pounds      Depression Screen PHQ 2/9 Scores 07/10/2019 04/15/2019 09/10/2018 06/11/2018 02/28/2018 12/06/2017 11/13/2017  PHQ - 2 Score 0 0 0 0 0 0 0  PHQ- 9 Score 3 - - - - - -    Fall Risk Fall Risk  07/10/2019 04/15/2019 09/10/2018 06/11/2018 12/06/2017  Falls in the past year? 0 0 0 0 No  Number falls in past yr: - 0 - - -  Injury with Fall? - 0 - - -  Risk for fall due to : Medication side effect - - - -  Follow up Falls evaluation completed;Education provided;Falls prevention discussed - - - -    Is the patient's home free of loose throw rugs in walkways, pet beds, electrical cords, etc?   yes      Grab bars in the bathroom? yes       Handrails on the stairs?   n/a      Adequate lighting?   yes  Timed Get Up and Go Performed n/a  Cognitive Function:     6CIT Screen 07/10/2019  What Year? 0 points  What month? 0 points  What time? 0 points  Count back from 20 0 points  Months in reverse 0 points  Repeat phrase 0 points  Total Score 0    Screening Tests Health Maintenance  Topic Date Due  . PNA vac Low Risk Adult (2 of 2 - PPSV23) 07/09/2020 (Originally 04/13/2019)  . OPHTHALMOLOGY EXAM  09/04/2019  . INFLUENZA VACCINE  09/14/2019  . HEMOGLOBIN A1C  09/15/2019  . FOOT EXAM  03/17/2020  . MAMMOGRAM  06/30/2021  . TETANUS/TDAP  02/27/2022  . COLONOSCOPY  07/01/2026  . DEXA SCAN  Completed  . COVID-19 Vaccine  Completed  . Hepatitis C Screening  Completed    Qualifies for Shingles Vaccine? yes  Cancer Screenings: Lung: Low Dose CT Chest recommended if Age 45-80 years, 30 pack-year currently smoking OR have quit w/in 15years. Patient does not qualify. Breast: Up to date on Mammogram? Yes   Up to date of Bone Density/Dexa? Yes Colorectal: up to date  Additional Screenings:  Hepatitis C Screening: 02/28/2012     Plan:    Patient wants to get to 235 pounds.  I have personally reviewed and noted the following in the patient's chart:   . Medical and social history . Use of alcohol, tobacco or illicit drugs  . Current medications and supplements . Functional ability and status . Nutritional status . Physical activity . Advanced directives . List of other physicians . Hospitalizations, surgeries, and ER visits in previous 12 months . Vitals . Screenings to include cognitive, depression, and falls . Referrals and appointments  In addition, I have reviewed and discussed with patient certain preventive protocols, quality metrics, and best practice recommendations. A written personalized care plan for preventive services as well as general preventive health recommendations were provided to patient.       Pamala Hurry  Alanson Aly, LPN   [id number]

## 2019-07-10 NOTE — Progress Notes (Signed)
This visit occurred during the SARS-CoV-2 public health emergency.  Safety protocols were in place, including screening questions prior to the visit, additional usage of staff PPE, and extensive cleaning of exam room while observing appropriate contact time as indicated for disinfecting solutions.  Subjective:     Patient ID: Michelle Hicks , female    DOB: [birth year] , 69 y.o.   MRN: [id number]   Chief Complaint  Patient presents with  . Diabetes  . Hypertension    HPI  She is here today for diabetes/BP check.   Diabetes She presents for her follow-up diabetic visit. She has type 2 diabetes mellitus. Her disease course has been stable. There are no hypoglycemic associated symptoms. Pertinent negatives for diabetes include no blurred vision and no chest pain. There are no hypoglycemic complications. Symptoms are stable. Risk factors for coronary artery disease include diabetes mellitus, dyslipidemia, hypertension, post-menopausal, sedentary lifestyle and obesity. She is compliant with treatment most of the time. She is following a generally healthy diet. She participates in exercise intermittently. An ACE inhibitor/angiotensin II receptor blocker is being taken.  Hypertension This is a chronic problem. The current episode started more than 1 year ago. The problem has been gradually improving since onset. The problem is controlled. Pertinent negatives include no blurred vision, chest pain, palpitations or shortness of breath. The current treatment provides moderate improvement.     Past Medical History:  Diagnosis Date  . Depression   . DM (diabetes mellitus) ([place])   . HTN (hypertension)      Family History  Problem Relation Age of Onset  . Diabetes Mother   . Pancreatic cancer Mother   . Hypertension Father   . Heart attack Father      Current Outpatient Medications:  .  bisacodyl (DULCOLAX) 5 MG EC tablet, Take 5 mg by mouth daily as needed for moderate constipation., Disp:  , Rfl:  .  Cholecalciferol (VITAMIN D-3) 1000 units CAPS, Take 1 capsule by mouth daily., Disp: , Rfl:  .  FLUoxetine (PROZAC) 20 MG capsule, Take 20 mg by mouth daily., Disp: , Rfl:  .  hydrochlorothiazide (HYDRODIURIL) 25 MG tablet, TAKE 1 TABLET BY MOUTH  DAILY, Disp: 90 tablet, Rfl: 3 .  KOMBIGLYZE XR 2.06-998 MG TB24, TAKE 1 TABLET BY MOUTH  TWICE DAILY WITH MEALS (Patient not taking: Reported on 04/15/2019), Disp: 180 tablet, Rfl: 3 .  Lancets Misc. (ONE TOUCH SURESOFT) MISC, Use as directed to check blood sugars 2 times per day dx e11.65, Disp: 300 each, Rfl: 2 .  levothyroxine (SYNTHROID, LEVOTHROID) 25 MCG tablet, Take 25 mcg by mouth daily., Disp: , Rfl:  .  meloxicam (MOBIC) 15 MG tablet, TAKE 1 TABLET BY MOUTH  DAILY AS NEEDED, Disp: 90 tablet, Rfl: 3 .  Multiple Vitamin (MULTIVITAMIN) tablet, Take 1 tablet by mouth daily., Disp: , Rfl:  .  omeprazole (PRILOSEC) 40 MG capsule, TAKE 1 CAPSULE BY MOUTH  EVERY DAY BEFORE A MEAL, Disp: 90 capsule, Rfl: 2 .  ONE TOUCH ULTRA TEST test strip, Use as directed to check blood sugars 2 times per day dx: e11.65, Disp: 300 each, Rfl: 2 .  pravastatin (PRAVACHOL) 40 MG tablet, Take 1 tablet (40 mg total) by mouth daily. (Patient taking differently: Take 40 mg by mouth daily. Patient takes 1/2 tablet (20 mg)), Disp: 90 tablet, Rfl: 2 .  Probiotic Product (PROBIOTIC DAILY PO), Take by mouth., Disp: , Rfl:  .  Semaglutide, 1 MG/DOSE, (OZEMPIC, 1 MG/DOSE,) 2 MG/1.5ML SOPN,  Inject 1 mg into the skin once a week., Disp: 6 pen, Rfl: 1 .  telmisartan (MICARDIS) 80 MG tablet, TAKE 1 TABLET BY MOUTH  DAILY, Disp: 90 tablet, Rfl: 3   Allergies  Allergen Reactions  . Sulfa Antibiotics      Review of Systems  Constitutional: Negative.   Eyes: Negative for blurred vision.  Respiratory: Negative.  Negative for shortness of breath.   Cardiovascular: Negative.  Negative for chest pain and palpitations.  Gastrointestinal: Negative.   Neurological: Negative.    Psychiatric/Behavioral: Negative.      Today's Vitals   07/10/19 1029  BP: 128/64  Pulse: 90  Temp: 98.1 F (36.7 C)  TempSrc: Oral  Weight: 244 lb 12.8 oz (111 kg)  Height: 5' 3.4" (1.61 m)   Body mass index is 42.82 kg/m.   Objective:  Physical Exam Vitals and nursing note reviewed.  Constitutional:      Appearance: Normal appearance. She is obese.  HENT:     Head: Normocephalic and atraumatic.  Cardiovascular:     Rate and Rhythm: Normal rate and regular rhythm.     Heart sounds: Normal heart sounds.  Pulmonary:     Effort: Pulmonary effort is normal.     Breath sounds: Normal breath sounds.  Skin:    General: Skin is warm.  Neurological:     General: No focal deficit present.     Mental Status: She is alert.  Psychiatric:        Mood and Affect: Mood normal.        Behavior: Behavior normal.         Assessment And Plan:     1. Uncontrolled type 2 diabetes mellitus with hyperglycemia (HCC)  Chronic, I will check labs as listed below. I will adjust meds as needed  - BMP8+EGFR - Hemoglobin A1c  2. Essential hypertension, malignant  Now well controlled. She will continue with current meds.   3. Drug-induced constipation  Unfortunately, she is experiencing constipation from [place]. She does not wish to stop meds at this time. Advised to increase water intake and daily activity. She was also given samples of Linzess 152m to take once daily. Advised to use Dulcolax suppository tonight, if hemorrhoids are not obstructing rectum.   4. Hemorrhoids, unspecified hemorrhoid type  She plans to continue with use of OTC preparations. If needed, will send rx Anusol to use prn. Rectal exam not performed today.   5. Class 3 severe obesity due to excess calories with serious comorbidity and body mass index (BMI) of 40.0 to 44.9 in adult (HCC)  BMI 42. She is encouraged to strive for BMI less than 35 to decrease cardiac risk. Importance of regular exercise was  discussed with the patient. Advised to work up to at least 150 minutes of moderate exercise per week.   RMaximino Greenland MD    THE PATIENT IS ENCOURAGED TO PRACTICE SOCIAL DISTANCING DUE TO THE COVID-19 PANDEMIC.

## 2019-07-11 ENCOUNTER — Telehealth: Payer: Self-pay | Admitting: Internal Medicine

## 2019-07-11 LAB — BMP8+EGFR
BUN/Creatinine Ratio: 15 (ref 12–28)
BUN: 11 mg/dL (ref 8–27)
CO2: 22 mmol/L (ref 20–29)
Calcium: 9.8 mg/dL (ref 8.7–10.3)
Chloride: 102 mmol/L (ref 96–106)
Creatinine, Ser: 0.75 mg/dL (ref 0.57–1.00)
GFR calc Af Amer: 96 mL/min/{1.73_m2} (ref >59)
GFR calc non Af Amer: 83 mL/min/{1.73_m2} (ref >59)
Glucose: 116 mg/dL — ABNORMAL HIGH (ref 65–99)
Potassium: 4.4 mmol/L (ref 3.5–5.2)
Sodium: 140 mmol/L (ref 134–144)

## 2019-07-11 LAB — HEMOGLOBIN A1C
Est. average glucose Bld gHb Est-mCnc: 154 mg/dL
Hgb A1c MFr Bld: 7 % — ABNORMAL HIGH (ref 4.8–5.6)

## 2019-07-11 LAB — TSH: TSH: 1.31 u[IU]/mL (ref 0.450–4.500)

## 2019-07-11 NOTE — Chronic Care Management (AMB) (Signed)
  Chronic Care Management   Note  07/11/2019 Name: JERMYA DOWDING MRN: [id number] DOB: Jan 23, 1951  AANYAH LOA is a 69 y.o. year old female who is a primary care patient of Glendale Chard, MD. I reached out to [name] by phone today in response to a referral sent by Ms. Preston Fleeting Vandergriff's PCP, Glendale Chard, MD     Ms. Dysart was given information about Chronic Care Management services today including:  1. CCM service includes personalized support from designated clinical staff supervised by her physician, including individualized plan of care and coordination with other care providers 2. 24/7 contact phone numbers for assistance for urgent and routine care needs. 3. Service will only be billed when office clinical staff spend 20 minutes or more in a month to coordinate care. 4. Only one practitioner may furnish and bill the service in a calendar month. 5. The patient may stop CCM services at any time (effective at the end of the month) by phone call to the office staff. 6. The patient will be responsible for cost sharing (co-pay) of up to 20% of the service fee (after annual deductible is met).  Patient agreed to services and verbal consent obtained.   Follow up plan: Telephone appointment with care management team member scheduled for: 07/25/2019  [facility]  [place], [place] [address] Direct Dial: [email or web address] Website: [email or web address]

## 2019-07-15 ENCOUNTER — Ambulatory Visit: Payer: Self-pay

## 2019-07-15 DIAGNOSIS — E1165 Type 2 diabetes mellitus with hyperglycemia: Secondary | ICD-10-CM

## 2019-07-15 DIAGNOSIS — I10 Essential (primary) hypertension: Secondary | ICD-10-CM

## 2019-07-15 NOTE — Chronic Care Management (AMB) (Signed)
  Chronic Care Management   Outreach Note  07/15/2019 Name: Michelle Hicks MRN: [id number] DOB: [birth year]  Referred by: Glendale Chard, MD Reason for referral : Care Coordination   SW placed an unsuccessful outbound call to the patient in effort to conduct an SDOH (social determinants of health) screen. SW left a HIPAA compliant voice message requesting a return call.  Follow Up Plan: The care management team will reach out to the patient again over the next 14 days.   Daneen Schick, BSW, CDP Social Worker, Certified Dementia Practitioner [place] [facility] [phone]

## 2019-07-24 ENCOUNTER — Ambulatory Visit: Payer: Self-pay

## 2019-07-24 DIAGNOSIS — E1165 Type 2 diabetes mellitus with hyperglycemia: Secondary | ICD-10-CM

## 2019-07-24 DIAGNOSIS — I10 Essential (primary) hypertension: Secondary | ICD-10-CM

## 2019-07-24 NOTE — Chronic Care Management (AMB) (Signed)
°  Chronic Care Management    Social Work General Note  07/24/2019 Name: KELSE PLOCH MRN: [id number] DOB: 1950-05-20  [name] is a 69 y.o. year old female who is a primary care patient of Glendale Chard, MD. The CCM was consulted to assist the patient with care management and care coordination.   Review of patient status, including review of consultants reports, relevant laboratory and other test results, and collaboration with appropriate care team members and the patient's provider was performed as part of comprehensive patient evaluation and provision of chronic care management services.    SDOH (Social Determinants of Health) assessments and interventions performed:  Yes; no acute challenges identified.   Outpatient Encounter Medications as of 07/24/2019  Medication Sig   bisacodyl (DULCOLAX) 5 MG EC tablet Take 5 mg by mouth daily as needed for moderate constipation.   Cholecalciferol (VITAMIN D-3) 1000 units CAPS Take 1 capsule by mouth daily.   FLUoxetine (PROZAC) 20 MG capsule Take 20 mg by mouth daily.   hydrochlorothiazide (HYDRODIURIL) 25 MG tablet TAKE 1 TABLET BY MOUTH  DAILY   KOMBIGLYZE XR 2.06-998 MG TB24 TAKE 1 TABLET BY MOUTH  TWICE DAILY WITH MEALS (Patient not taking: Reported on 04/15/2019)   Lancets Misc. (ONE TOUCH SURESOFT) MISC Use as directed to check blood sugars 2 times per day dx e11.65   levothyroxine (SYNTHROID, LEVOTHROID) 25 MCG tablet Take 25 mcg by mouth daily.   meloxicam (MOBIC) 15 MG tablet TAKE 1 TABLET BY MOUTH  DAILY AS NEEDED   Multiple Vitamin (MULTIVITAMIN) tablet Take 1 tablet by mouth daily.   omeprazole (PRILOSEC) 40 MG capsule TAKE 1 CAPSULE BY MOUTH  EVERY DAY BEFORE A MEAL   ONE TOUCH ULTRA TEST test strip Use as directed to check blood sugars 2 times per day dx: e11.65   pravastatin (PRAVACHOL) 40 MG tablet Take 1 tablet (40 mg total) by mouth daily. (Patient taking differently: Take 40 mg by mouth daily. Patient takes  1/2 tablet (20 mg))   Probiotic Product (PROBIOTIC DAILY PO) Take by mouth.   Semaglutide, 1 MG/DOSE, (OZEMPIC, 1 MG/DOSE,) 2 MG/1.5ML SOPN Inject 1 mg into the skin once a week.   telmisartan (MICARDIS) 80 MG tablet TAKE 1 TABLET BY MOUTH  DAILY   No facility-administered encounter medications on file as of 07/24/2019.     Follow Up Plan: No SW needs indicated at this time. The patient is encouraged to contact SW with future care coordination needs. The patient will be engaged by embedded PharmD and RN Care Manager to assist with care management needs.       Daneen Schick, BSW, CDP Social Worker, Certified Dementia Practitioner [facility] [phone]

## 2019-07-25 ENCOUNTER — Ambulatory Visit: Payer: Medicare Other

## 2019-07-25 ENCOUNTER — Other Ambulatory Visit: Payer: Self-pay

## 2019-07-25 DIAGNOSIS — I10 Essential (primary) hypertension: Secondary | ICD-10-CM

## 2019-07-25 DIAGNOSIS — E039 Hypothyroidism, unspecified: Secondary | ICD-10-CM

## 2019-07-25 DIAGNOSIS — E1165 Type 2 diabetes mellitus with hyperglycemia: Secondary | ICD-10-CM

## 2019-07-25 NOTE — Chronic Care Management (AMB) (Signed)
Chronic Care Management Pharmacy  Name: Michelle Hicks  MRN: [id number] DOB: [birth year]  Chief Complaint/ HPI  Michelle Hicks,  69 y.o. , female presents for their Initial CCM visit with the clinical pharmacist via telephone due to COVID-19 Pandemic.  PCP : Glendale Chard, MD  Their chronic conditions include: Hypertension, Diabetes, GERD, Hypothyroidism  Office Visits: 07/10/19 AWV and OV: Presented for HTN and BP follow up. Labs ordered (BMP8+EGFR, HgbA1c). Pt experiencing constipation from Ozempic, but does not wish to stop medication. Advised pt to increase water intake and daily activity. Given samples of Linzess 124mg. Advised to use Dulcolax suppository tonight. Using OTC medications for hemorrhoids, can send rx for Anusol to use prn.   04/15/19 OV: Presented for DM follow up. Pt was started on Ozempic at last visit, is now using 0.589monce weekly on Tuesdays. No issues reported with medications. Increase Ozempic to 47m70mnce weekly. Encouraged to follow up after taking higher dose. Advised pt to start stool softener daily to help with constipation (pt reported this symptom after starting Ozempic). If persistent constipation pt can start Miralax.   03/18/19 OV: Presented for annual exam and HTN and DM follow up. Impacted cerumen and thyromegaly noted on exam. Diabetic foot exam performed. Pt reported that insurance no longer covers KomIthacat will start Ozempic 0.80m5mekly for 2 weeks and then increase to 0.5mg 84mkly for 2 weeks. No family history of thyroid cancer. Follow up in 4 weeks. EKG performed, no acute changes. Both ears flushed by irrigation (ear lavage). Referred for thyroid ultrasound d/t goiter. Labs ordered (CMP14+EGFR, CBC, Lipid panel, HgbA1c, TSH, UA, UA- microalbumin).  Consult Visits:  CCM Encounters: 07/24/19 SW: Nn SW needs indicated at this time  Medications: Outpatient Encounter Medications as of 07/25/2019  Medication Sig  . bisacodyl (DULCOLAX) 5 MG  EC tablet Take 5 mg by mouth daily as needed for moderate constipation.  . Cholecalciferol (VITAMIN D-3) 1000 units CAPS Take 1 capsule by mouth daily.  . FLUMarland Kitchenxetine (PROZAC) 20 MG capsule Take 20 mg by mouth daily.  . hydrochlorothiazide (HYDRODIURIL) 25 MG tablet TAKE 1 TABLET BY MOUTH  DAILY  . KOMBIGLYZE XR 2.06-998 MG TB24 TAKE 1 TABLET BY MOUTH  TWICE DAILY WITH MEALS (Patient not taking: Reported on 04/15/2019)  . Lancets Misc. (ONE TOUCH SURESOFT) MISC Use as directed to check blood sugars 2 times per day dx e11.65  . levothyroxine (SYNTHROID, LEVOTHROID) 25 MCG tablet Take 25 mcg by mouth daily.  . meloxicam (MOBIC) 15 MG tablet TAKE 1 TABLET BY MOUTH  DAILY AS NEEDED  . Multiple Vitamin (MULTIVITAMIN) tablet Take 1 tablet by mouth daily.  . omeMarland Kitchenrazole (PRILOSEC) 40 MG capsule TAKE 1 CAPSULE BY MOUTH  EVERY DAY BEFORE A MEAL  . ONE TOUCH ULTRA TEST test strip Use as directed to check blood sugars 2 times per day dx: e11.65  . pravastatin (PRAVACHOL) 40 MG tablet Take 1 tablet (40 mg total) by mouth daily. (Patient taking differently: Take 40 mg by mouth daily. Patient takes 1/2 tablet (20 mg))  . Probiotic Product (PROBIOTIC DAILY PO) Take by mouth.  . Semaglutide, 1 MG/DOSE, (OZEMPIC, 1 MG/DOSE,) 2 MG/1.5ML SOPN Inject 1 mg into the skin once a week.  . telmisartan (MICARDIS) 80 MG tablet TAKE 1 TABLET BY MOUTH  DAILY   No facility-administered encounter medications on file as of 07/25/2019.    Current Diagnosis/Assessment:   SDOH Interventions     Most Recent Value  SDOH Interventions  Financial Strain Interventions Other (Comment)  [Will help patient with patient assistance application for Ozempic]     Goals Addressed            This Visit's Progress   . Pharmacy Care Plan       CARE PLAN ENTRY (see longitudinal plan of care for additional care plan information)  Current Barriers:  . Chronic Disease Management support, education, and care coordination needs related to  Hypertension, Hyperlipidemia, Diabetes, and Hypothyroidism   Hypertension BP Readings from Last 3 Encounters:  08/27/19 126/74  07/10/19 128/64  07/10/19 128/64   . Pharmacist Clinical Goal(s): o Over the next 180 days, patient will work with PharmD and providers to maintain BP goal <130/80 . Current regimen:  o Hydrochlorothiazide 73m daily o Telmisartan 868mdaily . Interventions: o Provided dietary and exercise recommendations . Patient self care activities - Over the next 180 days, patient will: o Check BP weekly, document, and provide at future appointments o Ensure daily salt intake < 2300 mg/day o Increase exercise to 30 minutes 5 times weekly o Try Tylenol for pain instead of meloxicam  Hyperlipidemia Lab Results  Component Value Date/Time   LDLCALC 78 03/18/2019 10:09 AM   . Pharmacist Clinical Goal(s): o Over the next 180 days, patient will work with PharmD and providers to achieve LDL goal < 70 . Current regimen:  o Pravastatin 4041m/2 tablet daily . Interventions: o Provided dietary and exercise recommendations o Suggested patient keep pravastatin on nightstand to help prevent missed doses o Monitor future lipid panel to determine if pravastatin dose may need to be increased . Patient self care activities - Over the next 90 days, patient will: o Keep pravastatin by bed to help you remember to take it  o Take pravastatin daily as directed o Increase exercise to 30 minutes 5 times weekly  Diabetes Lab Results  Component Value Date/Time   HGBA1C 7.2 (H) 08/27/2019 09:04 AM   HGBA1C 7.0 (H) 07/10/2019 10:42 AM   HGBA1C 7.3 09/04/2017 12:00 AM   . Pharmacist Clinical Goal(s): o Over the next 90 days, patient will work with PharmD and providers to achieve A1c goal <7% . Current regimen:  o Ozempic 1mg67mekly . Interventions: o Discussed Ozempic patient assistance application and requirements o Will assist patient in completing Ozempic application at in  office visit in 2 weeks o Provided dietary and exercise recommendations . Patient self care activities - Over the next 90 days, patient will: o Check blood sugar twice daily, document, and provide at future appointments o Contact provider with any episodes of hypoglycemia o Drink 64 ounces of water daily o Increase exercise to 30 minutes 5 times weekly  Hypothyroidism . Pharmacist Clinical Goal(s) o Over the next 180 days, patient will work with PharmD and providers to keep thyroid hormones within normal limits . Current regimen:  o Levothyroxine 50mc52mily . Interventions: o Discussed appropriate administration of levothyroxine . Patient self care activities - Over the next 180 days, patient will: o Continue levothyroxine daily as directed  Medication management . Pharmacist Clinical Goal(s): o Over the next 90 days, patient will work with PharmD and providers to maintain optimal medication adherence . Current pharmacy: [facility] . Interventions o Comprehensive medication review performed. o Continue current medication management strategy . Patient self care activities - Over the next 90 days, patient will: o Focus on medication adherence by using pill box and keeping evening medications by bed to minimize forgotten doses o Take  medications as prescribed o Report any questions or concerns to PharmD and/or provider(s)  Initial goal documentation        Diabetes   Recent Relevant Labs: Lab Results  Component Value Date/Time   HGBA1C 7.0 (H) 07/10/2019 10:42 AM   HGBA1C 6.9 (H) 03/18/2019 10:09 AM   HGBA1C 7.3 09/04/2017 12:00 AM   MICROALBUR 30 03/18/2019 10:22 AM   MICROALBUR 30 02/28/2018 10:07 AM    Checking BG: 2x per Day to 3x daily  Recent FBG Readings: usually < 125  Recent pre-meal BG readings: Recent 2hr PP BG readings: 144 Recent HS BG readings: 149 Patient has failed these meds in past: Kombiglyze Patient is currently controlled on the following  medications:   Ozempic 1mg weekly  Last diabetic Foot exam: 03/18/19 Last diabetic Eye exam:  Lab Results  Component Value Date/Time   HMDIABEYEEXA No Retinopathy 09/04/2018 12:00 AM    We discussed:   Diet extensively  Breakfast: oatmeal, quiche, oranges  Salads, vegetables, chicken stir fry on rice, fresh fruit  Has cut back on bread, not a lot of pasta or rice  Eats mostly baked/grilled chicken  Fries fish, bakes salmon  Eats some desserts (ice cream, etc)  Snacks on chips, pickles  Drinks mostly water (4-16oz) daily  Exercise extensively  Walks on treadmill some, has slacked off over the past few weeks  due to constipation  Goal is 30 minutes 5 times weekly  Patient assistance available for Ozempic  Pt interested due to high copay ($131 for 90 day supply)  Discussed household monthly income and patient assistance requirements   Plan -Continue current medications  -Pt to come into the office in 2 weeks to complete Ozempic PAP application   Hypertension   Office blood pressures are  BP Readings from Last 3 Encounters:  07/10/19 128/64  07/10/19 128/64  04/15/19 124/82    Patient has failed these meds in the past: Amlodipine, Losartan-HCTZ,  Patient is currently controlled on the following medications:   HCTZ 25mg daily  Telmisartan 80mg daily  Patient checks BP at home weekly  Patient home BP readings are ranging: 129/65  We discussed:  Pt feels BP is good  Goal is <130/80  Diet and exercise extensively  Denies headaches, dizziness, lightheadness  Plan -Continue current medications   Hyperlipidemia   Lipid Panel     Component Value Date/Time   CHOL 161 03/18/2019 1009   TRIG 78 03/18/2019 1009   HDL 68 03/18/2019 1009   LDLCALC 78 03/18/2019 1009    The 10-year ASCVD risk score (Goff DC Jr., et al., 2013) is: 17.4%   Values used to calculate the score:     Age: 68 years     Sex: Female     Is Non-Hispanic African American:  Yes     Diabetic: Yes     Tobacco smoker: No     Systolic Blood Pressure: 128 mmHg     Is BP treated: Yes     HDL Cholesterol: 68 mg/dL     Total Cholesterol: 161 mg/dL   Patient has failed these meds in past: Zetia Patient is currently uncontrolled on the following medications:   Pravastatin 40mg 1/2 tablet daily at bedtime  We discussed:  Diet and exercise extensively  Denies leg pains  Plan -Continue current medications  -Consider increasing to 1 tablet daily pending next lipid panel results  Hypothyroidism   Lab Results  Component Value Date/Time   TSH 1.310 07/10/2019 10:42 AM     TSH 2.180 03/18/2019 10:09 AM   TSH 1.220 06/11/2018 09:44 AM   Patient has failed these meds in past: N/A Patient is currently controlled on the following medications:  -Levothyroxine 26mg daily  We discussed:    Pt states she Is taking levothyroxine for nodules in thyroid, to "prevent it from turning into goiter"  Current dose of levothyroxine appropriate as thyroid hormone as within normal limits  Pt takes levothyroxine in the morning as instructed  Plan Continue current medications  GERD   Patient has failed these meds in past: Nexium Patient is currently controlled on the following medications:   Omeprazole 421mdaily  We discussed:    Pt tries to avoid foods that trigger symptoms of heartburn  Plan Continue current medications  Depression   Patient has failed these meds in past: Celexa Patient is currently controlled on the following medications:   N/A (Was previously taking fluoxetine 209maily)  We discussed:   Pt states that she was taking fluoxetine for anxiety  Now that she has retired she is no longer experiencing anxiety and stopped taking fluoxetine  She reports no symptoms at this time  Plan Continue control with lifestyle  Constipation   Patient has failed these meds in past: N/A Patient is currently controlled on the following  medications:   Docusate 200m14mily as needed  We discussed:   Given samples of Linzess at prior office visit, but constipation has improved and pt has not taken it yet  Encouraged patient to stay hydrated and drink 64oz of water daily  Plan Continue current medications   Pain   Patient has failed these meds in past: N/A Patient is currently controlled on the following medications:   Meloxicam 15mg78mly as needed  We discussed:  Pt is taking meloxicam as needed for aches and pains  Taking maybe 4 days per week  Possible issues with meloxicam include kidney damage, increased blood pressure, etc  Recommend pt try Tylenol 500mg 71mblets as needed  Plan Start Tylenol 500mg 277mlets as needed for pain, no more than 8 tablets daily  Vaccines   Reviewed and discussed patient's vaccination history.    Immunization History  Administered Date(s) Administered  . DTaP 02/28/2012  . Influenza, High Dose Seasonal PF 11/14/2017, 10/30/2018  . Influenza-Unspecified 11/14/2017, 10/30/2018  . Moderna SARS-COVID-2 Vaccination 03/28/2019, 04/25/2019  . Pneumococcal Conjugate-13 04/12/2018  . Pneumococcal-Unspecified 08/06/2013  . Zoster Recombinat (Shingrix) 11/14/2018   Plan  Recommended patient receive 2nd dose of Shingrix vaccine at pharmacy. Patient is planning to go soon.   Medication Management   Pt uses [facility] for all medications Uses pill box? Yes Pt endorses 95% compliance, misses 1-3 doses every couples of months  We discussed:   Importance of medication compliance  Sometimes misses evening medications because she has already gotten in bed and does not want to get back up  Recommend keeping evening meds on/in nightstand to help improve compliance with evening medications  Plan -Continue current medication management strategy  Follow up: 2 week in person visit to complete PAP application  CourtneJannette FogoD Clinical Pharmacist [facility] [phone]

## 2019-07-31 ENCOUNTER — Encounter: Payer: Self-pay | Admitting: Internal Medicine

## 2019-07-31 DIAGNOSIS — L308 Other specified dermatitis: Secondary | ICD-10-CM | POA: Diagnosis not present

## 2019-07-31 DIAGNOSIS — B351 Tinea unguium: Secondary | ICD-10-CM | POA: Diagnosis not present

## 2019-08-06 ENCOUNTER — Other Ambulatory Visit: Payer: Self-pay

## 2019-08-06 ENCOUNTER — Ambulatory Visit: Payer: Self-pay

## 2019-08-06 DIAGNOSIS — I10 Essential (primary) hypertension: Secondary | ICD-10-CM

## 2019-08-06 DIAGNOSIS — E1165 Type 2 diabetes mellitus with hyperglycemia: Secondary | ICD-10-CM

## 2019-08-07 ENCOUNTER — Telehealth: Payer: Self-pay

## 2019-08-07 ENCOUNTER — Ambulatory Visit (INDEPENDENT_AMBULATORY_CARE_PROVIDER_SITE_OTHER): Payer: Medicare Other

## 2019-08-07 DIAGNOSIS — E1165 Type 2 diabetes mellitus with hyperglycemia: Secondary | ICD-10-CM

## 2019-08-07 DIAGNOSIS — I10 Essential (primary) hypertension: Secondary | ICD-10-CM

## 2019-08-07 DIAGNOSIS — E039 Hypothyroidism, unspecified: Secondary | ICD-10-CM | POA: Diagnosis not present

## 2019-08-12 NOTE — Patient Instructions (Signed)
Visit Information  Goals Addressed      Patient Stated   .  "to keep my blood pressure under good control" (pt-stated)        CARE PLAN ENTRY (see longitudinal plan of care for additional care plan information)  Current Barriers:  Marland Kitchen Knowledge Deficits related to disease process and Self Health management of Hypertension . Chronic Disease Management support and education needs related to Hypertension and DMII  Nurse Case Manager Clinical Goal(s):  Marland Kitchen Over the next 90 days, patient will work with disease education and support to address needs related to disease education and support to help improve Self Health management of Hypertension as evidence by patient will achieve/maintain target BP <130/80  CCM RN CM Interventions:  08/08/19 call completed with patient  . Inter-disciplinary care team collaboration (see longitudinal plan of care) . Evaluation of current treatment plan related to Hypertension  and patient's adherence to plan as established by provider. . Provided education to patient re: disease process and how to achieve goal BP <130.80 . Reviewed medications with patient and discussed current antihypertensive regimen including dosage and frequency, patient is adherent w/o noted SE, denies financial hardship paying for meds . Discussed plans with patient for ongoing care management follow up and provided patient with direct contact information for care management team . Advised patient, providing education and rationale, to monitor blood pressure daily and record, calling the CCM team and PCP for findings outside established parameters.  . Provided patient with printed educational materials related to What is High Blood Pressure?; African Americans and High Blood Pressure; High Blood Pressure and Stroke; Life's Simple 7  Patient Self Care Activities:  . Self administers medications as prescribed . Attends all scheduled provider appointments . Calls pharmacy for medication  refills . Performs ADL's independently . Performs IADL's independently . Calls provider office for new concerns or questions  Initial goal documentation     .  "to keep my diabetes under good control" (pt-stated)        CARE PLAN ENTRY (see longitudinal plan of care for additional care plan information)  Current Barriers:  Marland Kitchen Knowledge Deficits related to disease process and Self Health management of Diabetes Mellitus . Chronic Disease Management support and education needs related to DMII and Hypertension   Nurse Case Manager Clinical Goal(s):  Marland Kitchen Over the next 90 days, patient will work with the CCM team and PCP to address needs related to disease education and support to improve Self Health management of DM  CCM RN CM Interventions:  08/08/19 call completed with patient  . Inter-disciplinary care team collaboration (see longitudinal plan of care) . Evaluation of current treatment plan related to Diabetes and patient's adherence to plan as established by provider. . Provided education to patient re: target A1c <7.0 %; Educated on disease process and how to achieve this goal; Educated on parameters for daily glycemic control FBS 80-130; <180 after meals; Educated on 15'15' rule  . Reviewed medication with patient and discussed indication, dosage and frequency of patient's prescribed medication o Semaglutide 1 mg/dose (Ozempic 1 mg/dose) 2 mg/1.5 ml, inject 1 mg sq once weekly  . Provided patient with printed educational materials related to Diabetes Meal Planning using the Plate Method; Diabetes Care Schedule; Grocery Shopping: Living with Diabetes; Prevent Complications with Diabetes . Advised patient, providing education and rationale, to check cbg 1-2 times daily before meals and record, calling the CCM team and PCP for findings outside established parameters  . Discussed plans with  patient for ongoing care management follow up and provided patient with direct contact information for  care management team  Patient Self Care Activities:  . Self administers medications as prescribed . Attends all scheduled provider appointments . Calls pharmacy for medication refills . Performs ADL's independently . Performs IADL's independently . Calls provider office for new concerns or questions  Initial goal documentation       Patient verbalizes understanding of instructions provided today.   Telephone follow up appointment with care management team member scheduled for:  10/03/19  Barb Merino, RN, BSN, CCM Care Management Coordinator [facility]  Direct Phone: [phone]

## 2019-08-12 NOTE — Chronic Care Management (AMB) (Signed)
Chronic Care Management   Initial Visit Note  08/08/2019 Name: Michelle Hicks MRN: [id number] DOB: 24-Oct-1950  Referred by: Glendale Chard, MD Reason for referral : Chronic Care Management (RQ Initial RN CM Call )   Michelle Hicks is a 69 y.o. year old female who is a primary care patient of Glendale Chard, MD. The CCM team was consulted for assistance with chronic disease management and care coordination needs related to HTN and DMII  Review of patient status, including review of consultants reports, relevant laboratory and other test results, and collaboration with appropriate care team members and the patient's provider was performed as part of comprehensive patient evaluation and provision of chronic care management services.    SDOH (Social Determinants of Health) assessments performed: Yes - no acute needs See Care Plan activities for detailed interventions related to [place] initial CCM RN CM outbound call to patient to assess for CM needs, a care plan was established.   Medications: Outpatient Encounter Medications as of 08/07/2019  Medication Sig  . hydrochlorothiazide (HYDRODIURIL) 25 MG tablet TAKE 1 TABLET BY MOUTH  DAILY  . Semaglutide, 1 MG/DOSE, (OZEMPIC, 1 MG/DOSE,) 2 MG/1.5ML SOPN Inject 1 mg into the skin once a week.  . telmisartan (MICARDIS) 80 MG tablet TAKE 1 TABLET BY MOUTH  DAILY  . bisacodyl (DULCOLAX) 5 MG EC tablet Take 5 mg by mouth daily as needed for moderate constipation.  . Cholecalciferol (VITAMIN D-3) 1000 units CAPS Take 1 capsule by mouth daily.  Marland Kitchen FLUoxetine (PROZAC) 20 MG capsule Take 20 mg by mouth daily.  Marland Kitchen KOMBIGLYZE XR 2.06-998 MG TB24 TAKE 1 TABLET BY MOUTH  TWICE DAILY WITH MEALS (Patient not taking: Reported on 04/15/2019)  . Lancets Misc. (ONE TOUCH SURESOFT) MISC Use as directed to check blood sugars 2 times per day dx e11.65  . levothyroxine (SYNTHROID, LEVOTHROID) 25 MCG tablet Take 25 mcg by mouth daily.  . meloxicam (MOBIC) 15  MG tablet TAKE 1 TABLET BY MOUTH  DAILY AS NEEDED  . Multiple Vitamin (MULTIVITAMIN) tablet Take 1 tablet by mouth daily.  Marland Kitchen omeprazole (PRILOSEC) 40 MG capsule TAKE 1 CAPSULE BY MOUTH  EVERY DAY BEFORE A MEAL  . ONE TOUCH ULTRA TEST test strip Use as directed to check blood sugars 2 times per day dx: e11.65  . pravastatin (PRAVACHOL) 40 MG tablet Take 1 tablet (40 mg total) by mouth daily. (Patient taking differently: Take 40 mg by mouth daily. Patient takes 1/2 tablet (20 mg))  . Probiotic Product (PROBIOTIC DAILY PO) Take by mouth.   No facility-administered encounter medications on file as of 08/07/2019.     Objective: Lab Results  Component Value Date   HGBA1C 7.0 (H) 07/10/2019   HGBA1C 6.9 (H) 03/18/2019   HGBA1C 7.0 (H) 06/11/2018   Lab Results  Component Value Date   MICROALBUR 30 03/18/2019   LDLCALC 78 03/18/2019   CREATININE 0.75 07/10/2019   BP Readings from Last 3 Encounters:  07/10/19 128/64  07/10/19 128/64  04/15/19 124/82    Goals Addressed      Patient Stated   .  "to keep my blood pressure under good control" (pt-stated)        CARE PLAN ENTRY (see longitudinal plan of care for additional care plan information)  Current Barriers:  Marland Kitchen Knowledge Deficits related to disease process and Self Health management of Hypertension . Chronic Disease Management support and education needs related to Hypertension and DMII  Nurse Case Manager  Clinical Goal(s):  Marland Kitchen Over the next 90 days, patient will work with disease education and support to address needs related to disease education and support to help improve Self Health management of Hypertension as evidence by patient will achieve/maintain target BP <130/80  CCM RN CM Interventions:  08/08/19 call completed with patient  . Inter-disciplinary care team collaboration (see longitudinal plan of care) . Evaluation of current treatment plan related to Hypertension  and patient's adherence to plan as established by  provider. . Provided education to patient re: disease process and how to achieve goal BP <130.80 . Reviewed medications with patient and discussed current antihypertensive regimen including dosage and frequency, patient is adherent w/o noted SE, denies financial hardship paying for meds . Discussed plans with patient for ongoing care management follow up and provided patient with direct contact information for care management team . Advised patient, providing education and rationale, to monitor blood pressure daily and record, calling the CCM team and PCP for findings outside established parameters.  . Provided patient with printed educational materials related to What is High Blood Pressure?; African Americans and High Blood Pressure; High Blood Pressure and Stroke; Life's Simple 7  Patient Self Care Activities:  . Self administers medications as prescribed . Attends all scheduled provider appointments . Calls pharmacy for medication refills . Performs ADL's independently . Performs IADL's independently . Calls provider office for new concerns or questions  Initial goal documentation     .  "to keep my diabetes under good control" (pt-stated)        CARE PLAN ENTRY (see longitudinal plan of care for additional care plan information)  Current Barriers:  Marland Kitchen Knowledge Deficits related to disease process and Self Health management of Diabetes Mellitus . Chronic Disease Management support and education needs related to DMII and Hypertension   Nurse Case Manager Clinical Goal(s):  Marland Kitchen Over the next 90 days, patient will work with the CCM team and PCP to address needs related to disease education and support to improve Self Health management of DM  CCM RN CM Interventions:  08/08/19 call completed with patient  . Inter-disciplinary care team collaboration (see longitudinal plan of care) . Evaluation of current treatment plan related to Diabetes and patient's adherence to plan as established by  provider. . Provided education to patient re: target A1c <7.0 %; Educated on disease process and how to achieve this goal; Educated on parameters for daily glycemic control FBS 80-130; <180 after meals; Educated on 15'15' rule  . Reviewed medication with patient and discussed indication, dosage and frequency of patient's prescribed medication o Semaglutide 1 mg/dose (Ozempic 1 mg/dose) 2 mg/1.5 ml, inject 1 mg sq once weekly  . Provided patient with printed educational materials related to Diabetes Meal Planning using the Plate Method; Diabetes Care Schedule; Grocery Shopping: Living with Diabetes; Prevent Complications with Diabetes . Advised patient, providing education and rationale, to check cbg 1-2 times daily before meals and record, calling the CCM team and PCP for findings outside established parameters  . Discussed plans with patient for ongoing care management follow up and provided patient with direct contact information for care management team  Patient Self Care Activities:  . Self administers medications as prescribed . Attends all scheduled provider appointments . Calls pharmacy for medication refills . Performs ADL's independently . Performs IADL's independently . Calls provider office for new concerns or questions  Initial goal documentation       Plan:   Telephone follow up appointment with care  management team member scheduled for: 10/03/19  Barb Merino, RN, BSN, CCM Care Management Coordinator [place] [facility]  Direct Phone: [phone]

## 2019-08-14 ENCOUNTER — Ambulatory Visit: Payer: Medicare Other | Admitting: Internal Medicine

## 2019-08-14 ENCOUNTER — Encounter: Payer: Self-pay | Admitting: Internal Medicine

## 2019-08-19 ENCOUNTER — Ambulatory Visit: Payer: Self-pay

## 2019-08-19 DIAGNOSIS — I10 Essential (primary) hypertension: Secondary | ICD-10-CM

## 2019-08-19 DIAGNOSIS — E1165 Type 2 diabetes mellitus with hyperglycemia: Secondary | ICD-10-CM

## 2019-08-19 NOTE — Chronic Care Management (AMB) (Signed)
   Chronic Care Management Pharmacy  Name: Michelle Hicks  MRN: [id number] DOB: [birth year]  Chief Complaint/ HPI  Patient dropped of proof of income documents at the front desk of the office today. Faxed completed patient assistance application for Ozempic 1mg  weekly to [employer] today (including patient and provider portions, and proof of income documents).   PCP : Glendale Chard, MD  [name], PharmD Clinical Pharmacist [facility] [phone]

## 2019-08-21 ENCOUNTER — Other Ambulatory Visit: Payer: Self-pay | Admitting: Internal Medicine

## 2019-08-27 ENCOUNTER — Encounter: Payer: Self-pay | Admitting: Internal Medicine

## 2019-08-27 ENCOUNTER — Other Ambulatory Visit: Payer: Self-pay

## 2019-08-27 ENCOUNTER — Ambulatory Visit (INDEPENDENT_AMBULATORY_CARE_PROVIDER_SITE_OTHER): Payer: Medicare Other | Admitting: Internal Medicine

## 2019-08-27 VITALS — BP 126/74 | HR 88 | Temp 98.3°F | Ht 63.4 in | Wt 246.6 lb

## 2019-08-27 DIAGNOSIS — Z6841 Body Mass Index (BMI) 40.0 and over, adult: Secondary | ICD-10-CM | POA: Diagnosis not present

## 2019-08-27 DIAGNOSIS — I10 Essential (primary) hypertension: Secondary | ICD-10-CM

## 2019-08-27 DIAGNOSIS — E1165 Type 2 diabetes mellitus with hyperglycemia: Secondary | ICD-10-CM

## 2019-08-27 NOTE — Patient Instructions (Signed)
Diabetes Mellitus and Exercise Exercising regularly is important for your overall health, especially when you have diabetes (diabetes mellitus). Exercising is not only about losing weight. It has many other health benefits, such as increasing muscle strength and bone density and reducing body fat and stress. This leads to improved fitness, flexibility, and endurance, all of which result in better overall health. Exercise has additional benefits for people with diabetes, including:  Reducing appetite.  Helping to lower and control blood glucose.  Lowering blood pressure.  Helping to control amounts of fatty substances (lipids) in the blood, such as cholesterol and triglycerides.  Helping the body to respond better to insulin (improving insulin sensitivity).  Reducing how much insulin the body needs.  Decreasing the risk for heart disease by: ? Lowering cholesterol and triglyceride levels. ? Increasing the levels of good cholesterol. ? Lowering blood glucose levels. What is my activity plan? Your health care provider or certified diabetes educator can help you make a plan for the type and frequency of exercise (activity plan) that works for you. Make sure that you:  Do at least 150 minutes of moderate-intensity or vigorous-intensity exercise each week. This could be brisk walking, biking, or water aerobics. ? Do stretching and strength exercises, such as yoga or weightlifting, at least 2 times a week. ? Spread out your activity over at least 3 days of the week.  Get some form of physical activity every day. ? Do not go more than 2 days in a row without some kind of physical activity. ? Avoid being inactive for more than 30 minutes at a time. Take frequent breaks to walk or stretch.  Choose a type of exercise or activity that you enjoy, and set realistic goals.  Start slowly, and gradually increase the intensity of your exercise over time. What do I need to know about managing my  diabetes?   Check your blood glucose before and after exercising. ? If your blood glucose is 240 mg/dL (13.3 mmol/L) or higher before you exercise, check your urine for ketones. If you have ketones in your urine, do not exercise until your blood glucose returns to normal. ? If your blood glucose is 100 mg/dL (5.6 mmol/L) or lower, eat a snack containing 15-20 grams of carbohydrate. Check your blood glucose 15 minutes after the snack to make sure that your level is above 100 mg/dL (5.6 mmol/L) before you start your exercise.  Know the symptoms of low blood glucose (hypoglycemia) and how to treat it. Your risk for hypoglycemia increases during and after exercise. Common symptoms of hypoglycemia can include: ? Hunger. ? Anxiety. ? Sweating and feeling clammy. ? Confusion. ? Dizziness or feeling light-headed. ? Increased heart rate or palpitations. ? Blurry vision. ? Tingling or numbness around the mouth, lips, or tongue. ? Tremors or shakes. ? Irritability.  Keep a rapid-acting carbohydrate snack available before, during, and after exercise to help prevent or treat hypoglycemia.  Avoid injecting insulin into areas of the body that are going to be exercised. For example, avoid injecting insulin into: ? The arms, when playing tennis. ? The legs, when jogging.  Keep records of your exercise habits. Doing this can help you and your health care provider adjust your diabetes management plan as needed. Write down: ? Food that you eat before and after you exercise. ? Blood glucose levels before and after you exercise. ? The type and amount of exercise you have done. ? When your insulin is expected to peak, if you use   insulin. Avoid exercising at times when your insulin is peaking.  When you start a new exercise or activity, work with your health care provider to make sure the activity is safe for you, and to adjust your insulin, medicines, or food intake as needed.  Drink plenty of water while  you exercise to prevent dehydration or heat stroke. Drink enough fluid to keep your urine clear or pale yellow. Summary  Exercising regularly is important for your overall health, especially when you have diabetes (diabetes mellitus).  Exercising has many health benefits, such as increasing muscle strength and bone density and reducing body fat and stress.  Your health care provider or certified diabetes educator can help you make a plan for the type and frequency of exercise (activity plan) that works for you.  When you start a new exercise or activity, work with your health care provider to make sure the activity is safe for you, and to adjust your insulin, medicines, or food intake as needed. This information is not intended to replace advice given to you by your health care provider. Make sure you discuss any questions you have with your health care provider. Document Revised: 08/24/2016 Document Reviewed: 07/12/2015 Elsevier Patient Education  2020 Elsevier Inc.  

## 2019-08-27 NOTE — Progress Notes (Signed)
This visit occurred during the SARS-CoV-2 public health emergency.  Safety protocols were in place, including screening questions prior to the visit, additional usage of staff PPE, and extensive cleaning of exam room while observing appropriate contact time as indicated for disinfecting solutions.  Subjective:     Patient ID: Michelle Hicks , female    DOB: 15-May-1950 , 69 y.o.   MRN: [id number]   Chief Complaint  Patient presents with  . Diabetes  . Hypertension    HPI  She is here today for diabetes check. She has not had any issues with her meds.    Diabetes She presents for her follow-up diabetic visit. She has type 2 diabetes mellitus. There are no diabetic associated symptoms. Risk factors for coronary artery disease include diabetes mellitus, obesity, sedentary lifestyle and post-menopausal. She participates in exercise intermittently. Eye exam is current.     Past Medical History:  Diagnosis Date  . Depression   . DM (diabetes mellitus) ([place])   . HTN (hypertension)      Family History  Problem Relation Age of Onset  . Diabetes Mother   . Pancreatic cancer Mother   . Hypertension Father   . Heart attack Father      Current Outpatient Medications:  .  Cholecalciferol (VITAMIN D-3 PO), Take 2,000 Units by mouth daily. , Disp: , Rfl:  .  docusate sodium (DULCOLAX) 100 MG capsule, Take 100 mg by mouth daily., Disp: , Rfl:  .  FLUoxetine (PROZAC) 20 MG capsule, Take 20 mg by mouth daily., Disp: , Rfl:  .  hydrochlorothiazide (HYDRODIURIL) 25 MG tablet, TAKE 1 TABLET BY MOUTH  DAILY, Disp: 90 tablet, Rfl: 3 .  Lancets Misc. (ONE TOUCH SURESOFT) MISC, Use as directed to check blood sugars 2 times per day dx e11.65, Disp: 300 each, Rfl: 2 .  levothyroxine (SYNTHROID) 50 MCG tablet, Take 25 mcg by mouth daily. , Disp: , Rfl:  .  meloxicam (MOBIC) 15 MG tablet, TAKE 1 TABLET BY MOUTH  DAILY AS NEEDED, Disp: 90 tablet, Rfl: 3 .  Multiple Vitamin (MULTIVITAMIN) tablet, Take  1 tablet by mouth daily., Disp: , Rfl:  .  omeprazole (PRILOSEC) 40 MG capsule, TAKE 1 CAPSULE BY MOUTH  EVERY DAY BEFORE A MEAL, Disp: 90 capsule, Rfl: 2 .  ONETOUCH ULTRA test strip, USE AS DIRECTED TO CHECK  BLOOD SUGARS TWICE DAILY, Disp: 150 strip, Rfl: 3 .  pravastatin (PRAVACHOL) 40 MG tablet, Take 1 tablet (40 mg total) by mouth daily. (Patient taking differently: Take 40 mg by mouth daily. Patient takes 1/2 tablet (20 mg)), Disp: 90 tablet, Rfl: 2 .  Probiotic Product (PROBIOTIC DAILY PO), Take by mouth., Disp: , Rfl:  .  Semaglutide, 1 MG/DOSE, (OZEMPIC, 1 MG/DOSE,) 2 MG/1.5ML SOPN, Inject 1 mg into the skin once a week. (Patient taking differently: Inject 1 mg into the skin once a week. 0.5 mg), Disp: 6 pen, Rfl: 1 .  telmisartan (MICARDIS) 80 MG tablet, TAKE 1 TABLET BY MOUTH  DAILY, Disp: 90 tablet, Rfl: 3 .  KOMBIGLYZE XR 2.06-998 MG TB24, TAKE 1 TABLET BY MOUTH  TWICE DAILY WITH MEALS (Patient not taking: Reported on 04/15/2019), Disp: 180 tablet, Rfl: 3   Allergies  Allergen Reactions  . Sulfa Antibiotics      Review of Systems  Constitutional: Negative.   Respiratory: Negative.   Cardiovascular: Negative.   Gastrointestinal: Negative.   Neurological: Negative.   Psychiatric/Behavioral: Negative.      Today's Vitals  08/27/19 0835  BP: 126/74  Pulse: 88  Temp: 98.3 F (36.8 C)  TempSrc: Oral  Weight: 246 lb 9.6 oz (111.9 kg)  Height: 5' 3.4" (1.61 m)  PainSc: 0-No pain   Body mass index is 43.13 kg/m.  Wt Readings from Last 3 Encounters:  08/27/19 246 lb 9.6 oz (111.9 kg)  07/10/19 244 lb 12.8 oz (111 kg)  07/10/19 244 lb 12.8 oz (111 kg)   Objective:  Physical Exam Vitals and nursing note reviewed.  Constitutional:      Appearance: Normal appearance.  HENT:     Head: Normocephalic and atraumatic.  Cardiovascular:     Rate and Rhythm: Normal rate and regular rhythm.     Heart sounds: Normal heart sounds.  Pulmonary:     Effort: Pulmonary effort is  normal.     Breath sounds: Normal breath sounds.  Skin:    General: Skin is warm.  Neurological:     General: No focal deficit present.     Mental Status: She is alert.  Psychiatric:        Mood and Affect: Mood normal.        Behavior: Behavior normal.         Assessment And Plan:     1. Uncontrolled type 2 diabetes mellitus with hyperglycemia (HCC)  Chronic, unfortunately she has yet to receive her meds from Patient Assistance. She was given a sample of Ozempic 0.58m and advised to inject 0.529mtwice weekly on Tuesdays. She will call me in two weeks if her meds are still not in. I will check labs as listed below, previous labs reviewed in full detail.  - Hemoglobin A1c - BMP8+EGFR  2. Essential hypertension, malignant  Chronic, well controlled. She will continue with current meds for now. She reports she does not need refills at this time.   3. Class 3 severe obesity due to excess calories with serious comorbidity and body mass index (BMI) of 40.0 to 44.9 in adult (HCC)  BMI 43. She was advised to incorporate more exercise into her daily routine. Advised to aim for at least 30 minutes of exercise five days per week. She responded affirmatively to setting this time aside for herself.    Patient was given opportunity to ask questions. Patient verbalized understanding of the plan and was able to repeat key elements of the plan. All questions were answered to their satisfaction.  RoMaximino GreenlandMD   I, RoMaximino GreenlandMD, have reviewed all documentation for this visit. The documentation on 08/27/19 for the exam, diagnosis, procedures, and orders are all accurate and complete.  THE PATIENT IS ENCOURAGED TO PRACTICE SOCIAL DISTANCING DUE TO THE COVID-19 PANDEMIC.

## 2019-08-28 LAB — BMP8+EGFR
BUN/Creatinine Ratio: 15 (ref 12–28)
BUN: 12 mg/dL (ref 8–27)
CO2: 25 mmol/L (ref 20–29)
Calcium: 9.9 mg/dL (ref 8.7–10.3)
Chloride: 99 mmol/L (ref 96–106)
Creatinine, Ser: 0.82 mg/dL (ref 0.57–1.00)
GFR calc Af Amer: 86 mL/min/{1.73_m2} (ref >59)
GFR calc non Af Amer: 75 mL/min/{1.73_m2} (ref >59)
Glucose: 127 mg/dL — ABNORMAL HIGH (ref 65–99)
Potassium: 4.2 mmol/L (ref 3.5–5.2)
Sodium: 137 mmol/L (ref 134–144)

## 2019-08-28 LAB — HEMOGLOBIN A1C
Est. average glucose Bld gHb Est-mCnc: 160 mg/dL
Hgb A1c MFr Bld: 7.2 % — ABNORMAL HIGH (ref 4.8–5.6)

## 2019-08-29 NOTE — Chronic Care Management (AMB) (Signed)
° °  Chronic Care Management Pharmacy  Name: Michelle Hicks  MRN: [id number] DOB: [birth year]  Chief Complaint/ HPI  [name],  68 y.o. , female presents for their appointment in office to complete patient assistance paperwork.   PCP : Glendale Chard, MD   Patient completed the patient portion of the Ozempic patient assistance application provided by [employer], except for proof of income. Patient provided copies of insurance cards and signed the application. She was given a copy of the application for her records. Provider portion of application will be given to PCP to sign. Patient will provide proof of income documents as soon as possible to complete application. Application will then be sent to [employer] for approval.  Follow up: 3 month phone visit  [name], PharmD Clinical Pharmacist [facility] [phone]

## 2019-08-29 NOTE — Patient Instructions (Signed)
Visit Information  Goals Addressed            This Visit's Progress   . Pharmacy Care Plan       CARE PLAN ENTRY (see longitudinal plan of care for additional care plan information)  Current Barriers:  . Chronic Disease Management support, education, and care coordination needs related to Hypertension, Hyperlipidemia, Diabetes, and Hypothyroidism   Hypertension BP Readings from Last 3 Encounters:  08/27/19 126/74  07/10/19 128/64  07/10/19 128/64   . Pharmacist Clinical Goal(s): o Over the next 180 days, patient will work with PharmD and providers to maintain BP goal <130/80 . Current regimen:  o Hydrochlorothiazide 25mg  daily o Telmisartan 80mg  daily . Interventions: o Provided dietary and exercise recommendations . Patient self care activities - Over the next 180 days, patient will: o Check BP weekly, document, and provide at future appointments o Ensure daily salt intake < 2300 mg/day o Increase exercise to 30 minutes 5 times weekly o Try Tylenol for pain instead of meloxicam  Hyperlipidemia Lab Results  Component Value Date/Time   LDLCALC 78 03/18/2019 10:09 AM   . Pharmacist Clinical Goal(s): o Over the next 180 days, patient will work with PharmD and providers to achieve LDL goal < 70 . Current regimen:  o Pravastatin 40mg  1/2 tablet daily . Interventions: o Provided dietary and exercise recommendations o Suggested patient keep pravastatin on nightstand to help prevent missed doses o Monitor future lipid panel to determine if pravastatin dose may need to be increased . Patient self care activities - Over the next 90 days, patient will: o Keep pravastatin by bed to help you remember to take it  o Take pravastatin daily as directed o Increase exercise to 30 minutes 5 times weekly  Diabetes Lab Results  Component Value Date/Time   HGBA1C 7.2 (H) 08/27/2019 09:04 AM   HGBA1C 7.0 (H) 07/10/2019 10:42 AM   HGBA1C 7.3 09/04/2017 12:00 AM   . Pharmacist  Clinical Goal(s): o Over the next 90 days, patient will work with PharmD and providers to achieve A1c goal <7% . Current regimen:  o Ozempic 1mg  weekly . Interventions: o Discussed Ozempic patient assistance application and requirements o Will assist patient in completing Ozempic application at in office visit in 2 weeks o Provided dietary and exercise recommendations . Patient self care activities - Over the next 90 days, patient will: o Check blood sugar twice daily, document, and provide at future appointments o Contact provider with any episodes of hypoglycemia o Drink 64 ounces of water daily o Increase exercise to 30 minutes 5 times weekly  Hypothyroidism . Pharmacist Clinical Goal(s) o Over the next 180 days, patient will work with PharmD and providers to keep thyroid hormones within normal limits . Current regimen:  o Levothyroxine 48mcg daily . Interventions: o Discussed appropriate administration of levothyroxine . Patient self care activities - Over the next 180 days, patient will: o Continue levothyroxine daily as directed  Medication management . Pharmacist Clinical Goal(s): o Over the next 90 days, patient will work with PharmD and providers to maintain optimal medication adherence . Current pharmacy: [facility] . Interventions o Comprehensive medication review performed. o Continue current medication management strategy . Patient self care activities - Over the next 90 days, patient will: o Focus on medication adherence by using pill box and keeping evening medications by bed to minimize forgotten doses o Take medications as prescribed o Report any questions or concerns to PharmD and/or provider(s)  Initial goal documentation  Ms. Angell was given information about Chronic Care Management services today including:  1. CCM service includes personalized support from designated clinical staff supervised by her physician, including individualized plan  of care and coordination with other care providers 2. 24/7 contact phone numbers for assistance for urgent and routine care needs. 3. Standard insurance, coinsurance, copays and deductibles apply for chronic care management only during months in which we provide at least 20 minutes of these services. Most insurances cover these services at 100%, however patients may be responsible for any copay, coinsurance and/or deductible if applicable. This service may help you avoid the need for more expensive face-to-face services. 4. Only one practitioner may furnish and bill the service in a calendar month. 5. The patient may stop CCM services at any time (effective at the end of the month) by phone call to the office staff.  Patient agreed to services and verbal consent obtained.   Patient verbalizes understanding of instructions provided today.  Face to Face appointment with pharmacist scheduled for: 08/06/19  [name], PharmD Clinical Pharmacist [facility] [phone]

## 2019-08-29 NOTE — Patient Instructions (Signed)
Visit Information  Goals Addressed            This Visit's Progress   . Ozempic Patient Assistance       CARE PLAN ENTRY (see longitudinal plan of care for additional care plan information)  Current Barriers:  . 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 Comprehensive medication review completed; medication list updated in electronic medical record.  Bertram Savin care team collaboration (see longitudinal plan of care) . Ozempic by [employer]: Patient meets income criteria for this medication's patient assistance program. Reviewed application process. Patient will provide proof of income. Will collaborate with primary care provider Glendale Chard, MD for their portion of application. Once completed, will submit to [employer] patient assistance program. . Patient signed application today  Patient Self Care Activities:  . Patient will provide necessary portions of application   Initial goal documentation        Patient verbalizes understanding of instructions provided today.   Telephone follow up appointment with pharmacy team member scheduled for: 10/29/19 @ 3:30 PM  [name], PharmD Clinical Pharmacist [facility] [phone]

## 2019-09-22 ENCOUNTER — Encounter: Payer: Self-pay | Admitting: Internal Medicine

## 2019-09-23 ENCOUNTER — Telehealth: Payer: Self-pay

## 2019-09-24 ENCOUNTER — Telehealth: Payer: Self-pay

## 2019-09-24 NOTE — Telephone Encounter (Signed)
The pt was notified that her Ozempic 1mg  pens has been delivered from [employer] and is ready for pickup.

## 2019-09-26 NOTE — Chronic Care Management (AMB) (Signed)
    Chronic Care Management Pharmacy Assistant   Name: SHATINA STREETS  MRN: [id number] DOB: 30-Oct-1950  Reason for Encounter: Medication Review/ Patient Assistance Follow Up  PCP : Glendale Chard, MD  Allergies:   Allergies  Allergen Reactions  . Sulfa Antibiotics     Medications: Outpatient Encounter Medications as of 09/23/2019  Medication Sig  . aspirin EC 81 MG tablet Take 81 mg by mouth daily.  . Cholecalciferol (VITAMIN D-3 PO) Take 2,000 Units by mouth daily.   Marland Kitchen docusate sodium (DULCOLAX) 100 MG capsule Take 200 mg by mouth daily as needed.   Marland Kitchen FLUoxetine (PROZAC) 20 MG capsule Take 20 mg by mouth daily. (Patient not taking: Reported on 08/29/2019)  . hydrochlorothiazide (HYDRODIURIL) 25 MG tablet TAKE 1 TABLET BY MOUTH  DAILY  . KOMBIGLYZE XR 2.06-998 MG TB24 TAKE 1 TABLET BY MOUTH  TWICE DAILY WITH MEALS (Patient not taking: Reported on 04/15/2019)  . Lancets Misc. (ONE TOUCH SURESOFT) MISC Use as directed to check blood sugars 2 times per day dx e11.65  . levothyroxine (SYNTHROID) 50 MCG tablet Take 50 mcg by mouth daily.   . meloxicam (MOBIC) 15 MG tablet TAKE 1 TABLET BY MOUTH  DAILY AS NEEDED  . Multiple Vitamin (MULTIVITAMIN) tablet Take 1 tablet by mouth daily.  Marland Kitchen omeprazole (PRILOSEC) 40 MG capsule TAKE 1 CAPSULE BY MOUTH  EVERY DAY BEFORE A MEAL (Patient taking differently: TAKE 1 CAPSULE BY MOUTH  EVERY DAY BEFORE A MEAL AS NEEDED)  . ONETOUCH ULTRA test strip USE AS DIRECTED TO CHECK  BLOOD SUGARS TWICE DAILY  . pravastatin (PRAVACHOL) 40 MG tablet Take 1 tablet (40 mg total) by mouth daily. (Patient taking differently: Take 40 mg by mouth daily. Patient takes 1/2 tablet (20 mg))  . Probiotic Product (PROBIOTIC DAILY PO) Take by mouth. (Patient not taking: Reported on 08/29/2019)  . Semaglutide, 1 MG/DOSE, (OZEMPIC, 1 MG/DOSE,) 2 MG/1.5ML SOPN Inject 1 mg into the skin once a week. (Patient taking differently: Inject 1 mg into the skin once a week. 0.5 mg)  .  telmisartan (MICARDIS) 80 MG tablet TAKE 1 TABLET BY MOUTH  DAILY   No facility-administered encounter medications on file as of 09/23/2019.    Current Diagnosis: Patient Active Problem List   Diagnosis Date Noted  . Class 3 severe obesity due to excess calories with serious comorbidity and body mass index (BMI) of 40.0 to 44.9 in adult ([place]) 03/18/2019  . Essential hypertension, malignant 11/13/2017  . Gastroesophageal reflux disease without esophagitis 11/13/2017  . Hypertension 09/04/2017  . Type II diabetes mellitus, uncontrolled ([place]) 09/04/2017     Follow-Up:  Patient [place] regarding patient assistance form sent on 09/03/2019 for Ozempic 1 mg. Spoke with [place], medication was sent to shipping department on 09/05/19 and will take 10-14 days for arrival. Medication should arrive at office by 09/25/19, if not received on that date, we can call on 09/26/19 to get patient a free 30 day voucher to take to their local pharmacy. [name], CPP aware. Medication arrived to the office on 09/24/19, patient was notified by front office. Pattricia Boss, [place] Pharmacist Assistant [phone]

## 2019-10-03 ENCOUNTER — Telehealth: Payer: Self-pay

## 2019-10-03 NOTE — Telephone Encounter (Cosign Needed)
  Chronic Care Management   Outreach Note  10/03/2019 Name: Michelle Hicks MRN: [id number] DOB: [birth year]  Referred by: Glendale Chard, MD Reason for referral : Chronic Care Management ([facility] RN CM Call )   An unsuccessful telephone outreach was attempted today. The patient was referred to the case management team for assistance with care management and care coordination.   Follow Up Plan: A HIPPA compliant phone message was left for the patient providing contact information and requesting a return call.  Telephone follow up appointment with care management team member scheduled for: 11/07/19  Barb Merino, RN, BSN, CCM Care Management Coordinator [facility]  Direct Phone: [phone]

## 2019-10-16 ENCOUNTER — Telehealth: Payer: Self-pay | Admitting: *Deleted

## 2019-10-16 NOTE — Chronic Care Management (AMB) (Signed)
  Chronic Care Management   Note  10/16/2019 Name: Michelle Hicks MRN: [id number] DOB: [birth year]  Michelle Hicks is a 69 y.o. year old female who is a primary care patient of Glendale Chard, MD and is actively engaged with the care management team. I reached out to Michelle Hicks by phone today to assist with re-scheduling a follow up visit with the Pharmacist  Follow up plan: Unsuccessful telephone outreach attempt made. A HIPPA compliant phone message was left for the patient providing contact information and requesting a return call.  The care management team will reach out to the patient again over the next 7 days.  If patient returns call to provider office, please advise to call [place] at [phone].  [facility]

## 2019-10-24 NOTE — Chronic Care Management (AMB) (Signed)
  Chronic Care Management   Note  10/24/2019 Name: [name] MRN: [id number] DOB: [birth year]  [name] is a 69 y.o. year old female who is a primary care patient of Glendale Chard, MD and is actively engaged with the care management team. I reached out to [name] by phone today to assist with re-scheduling a follow up visit with the Pharmacist  Follow up plan: Telephone appointment with care management team member scheduled for:10/30/2019  [place] [facility]

## 2019-10-29 ENCOUNTER — Telehealth: Payer: Self-pay

## 2019-10-30 ENCOUNTER — Ambulatory Visit: Payer: Self-pay

## 2019-10-30 ENCOUNTER — Other Ambulatory Visit: Payer: Self-pay

## 2019-10-30 DIAGNOSIS — I10 Essential (primary) hypertension: Secondary | ICD-10-CM

## 2019-10-30 DIAGNOSIS — E1165 Type 2 diabetes mellitus with hyperglycemia: Secondary | ICD-10-CM

## 2019-10-30 NOTE — Patient Instructions (Addendum)
Visit Information  Goals Addressed            This Visit's Progress   . Pharmacy Care Plan       CARE PLAN ENTRY (see longitudinal plan of care for additional care plan information)  Current Barriers:  . Chronic Disease Management support, education, and care coordination needs related to Hypertension, Hyperlipidemia, Diabetes, and Hypothyroidism   Hypertension BP Readings from Last 3 Encounters:  08/27/19 126/74  07/10/19 128/64  07/10/19 128/64   . Pharmacist Clinical Goal(s): o Over the next 180 days, patient will work with PharmD and providers to maintain BP goal <130/80 . Current regimen:  o Hydrochlorothiazide 25mg  daily o Telmisartan 80mg  daily . Interventions: o Provided dietary and exercise recommendations o Discussed what symptoms to watch for as a sign of low blood pressure (dizziness, lightheadedness, fainting, etc) o Discussed appropriate goal for blood pressure (less than 130/80) . Patient self care activities - Over the next 180 days, patient will: o Check BP weekly, document, and provide at future appointments o Ensure daily salt intake < 2300 mg/day o Increase exercise to 30 minutes 5 times weekly (150 minutes per week) o Try Tylenol for pain instead of meloxicam  Hyperlipidemia Lab Results  Component Value Date/Time   LDLCALC 78 03/18/2019 10:09 AM   . Pharmacist Clinical Goal(s): o Over the next 180 days, patient will work with PharmD and providers to achieve LDL goal < 70 . Current regimen:  o Pravastatin 40mg  1/2 tablet daily . Interventions: o Provided dietary and exercise recommendations o Suggested patient keep pravastatin on nightstand to help prevent missed doses o Monitor future lipid panel to determine if pravastatin dose may need to be increased . Patient self care activities - Over the next 90 days, patient will: o Keep pravastatin by bed to help you remember to take it  o Take pravastatin daily as directed o Increase exercise to 30  minutes 5 times weekly  Diabetes Lab Results  Component Value Date/Time   HGBA1C 7.2 (H) 08/27/2019 09:04 AM   HGBA1C 7.0 (H) 07/10/2019 10:42 AM   HGBA1C 7.3 09/04/2017 12:00 AM   . Pharmacist Clinical Goal(s): o Over the next 90 days, patient will work with PharmD and providers to achieve A1c goal <7% . Current regimen:  o Ozempic 1mg  weekly . Interventions: o Ozempic patient assistance was approved and delivered o Provided dietary and exercise recommendations o Discussed appropriate goals for fasting blood sugar (80-130) and 2 hours after eating (less than 180) o Congratulated patient on increased exercise . Patient self care activities - Over the next 90 days, patient will: o Check blood sugar twice daily, document, and provide at future appointments o Contact provider with any episodes of hypoglycemia o Drink 64 ounces of water daily o Increase exercise to 30 minutes 5 times weekly (150 minutes per week)  Hypothyroidism . Pharmacist Clinical Goal(s) o Over the next 180 days, patient will work with PharmD and providers to keep thyroid hormones within normal limits . Current regimen:  o Levothyroxine 74mcg daily . Interventions: o Discussed appropriate administration of levothyroxine . Patient self care activities - Over the next 180 days, patient will: o Continue levothyroxine daily as directed  Medication management . Pharmacist Clinical Goal(s): o Over the next 90 days, patient will work with PharmD and providers to maintain optimal medication adherence . Current pharmacy: [facility] . Interventions o Comprehensive medication review performed. o Continue current medication management strategy . Patient self care activities - Over  the next 90 days, patient will: o Focus on medication adherence by using pill box and keeping evening medications by bed to minimize forgotten doses o Take medications as prescribed o Report any questions or concerns to PharmD and/or  provider(s)  Please see past updates related to this goal by clicking on the "Past Updates" button in the selected goal         The patient verbalized understanding of instructions provided today and agreed to receive a mailed copy of patient instruction and/or educational materials.  Telephone follow up appointment with pharmacy team member scheduled for:01/29/20 @ 3:30 PM  [name], PharmD Clinical Pharmacist [facility] [phone]   Diabetes Mellitus and Nutrition, Adult When you have diabetes (diabetes mellitus), it is very important to have healthy eating habits because your blood sugar (glucose) levels are greatly affected by what you eat and drink. Eating healthy foods in the appropriate amounts, at about the same times every day, can help you:  Control your blood glucose.  Lower your risk of heart disease.  Improve your blood pressure.  Reach or maintain a healthy weight. Every person with diabetes is different, and each person has different needs for a meal plan. Your health care provider may recommend that you work with a diet and nutrition specialist (dietitian) to make a meal plan that is best for you. Your meal plan may vary depending on factors such as:  The calories you need.  The medicines you take.  Your weight.  Your blood glucose, blood pressure, and cholesterol levels.  Your activity level.  Other health conditions you have, such as heart or kidney disease. How do carbohydrates affect me? Carbohydrates, also called carbs, affect your blood glucose level more than any other type of food. Eating carbs naturally raises the amount of glucose in your blood. Carb counting is a method for keeping track of how many carbs you eat. Counting carbs is important to keep your blood glucose at a healthy level, especially if you use insulin or take certain oral diabetes medicines. It is important to know how many carbs you can safely  have in each meal. This is different for every person. Your dietitian can help you calculate how many carbs you should have at each meal and for each snack. Foods that contain carbs include:  Bread, cereal, rice, pasta, and crackers.  Potatoes and corn.  Peas, beans, and lentils.  Milk and yogurt.  Fruit and juice.  Desserts, such as cakes, cookies, ice cream, and candy. How does alcohol affect me? Alcohol can cause a sudden decrease in blood glucose (hypoglycemia), especially if you use insulin or take certain oral diabetes medicines. Hypoglycemia can be a life-threatening condition. Symptoms of hypoglycemia (sleepiness, dizziness, and confusion) are similar to symptoms of having too much alcohol. If your health care provider says that alcohol is safe for you, follow these guidelines:  Limit alcohol intake to no more than 1 drink per day for nonpregnant women and 2 drinks per day for men. One drink equals 12 oz of beer, 5 oz of wine, or 1 oz of hard liquor.  Do not drink on an empty stomach.  Keep yourself hydrated with water, diet soda, or unsweetened iced tea.  Keep in mind that regular soda, juice, and other mixers may contain a lot of sugar and must be counted as carbs. What are tips for following this plan?  Reading food labels  Start by checking the serving size on the "Nutrition Facts"  label of packaged foods and drinks. The amount of calories, carbs, fats, and other nutrients listed on the label is based on one serving of the item. Many items contain more than one serving per package.  Check the total grams (g) of carbs in one serving. You can calculate the number of servings of carbs in one serving by dividing the total carbs by 15. For example, if a food has 30 g of total carbs, it would be equal to 2 servings of carbs.  Check the number of grams (g) of saturated and trans fats in one serving. Choose foods that have low or no amount of these fats.  Check the number of  milligrams (mg) of salt (sodium) in one serving. Most people should limit total sodium intake to less than 2,300 mg per day.  Always check the nutrition information of foods labeled as "low-fat" or "nonfat". These foods may be higher in added sugar or refined carbs and should be avoided.  Talk to your dietitian to identify your daily goals for nutrients listed on the label. Shopping  Avoid buying canned, premade, or processed foods. These foods tend to be high in fat, sodium, and added sugar.  Shop around the outside edge of the grocery store. This includes fresh fruits and vegetables, bulk grains, fresh meats, and fresh dairy. Cooking  Use low-heat cooking methods, such as baking, instead of high-heat cooking methods like deep frying.  Cook using healthy oils, such as olive, canola, or sunflower oil.  Avoid cooking with butter, cream, or high-fat meats. Meal planning  Eat meals and snacks regularly, preferably at the same times every day. Avoid going long periods of time without eating.  Eat foods high in fiber, such as fresh fruits, vegetables, beans, and whole grains. Talk to your dietitian about how many servings of carbs you can eat at each meal.  Eat 4-6 ounces (oz) of lean protein each day, such as lean meat, chicken, fish, eggs, or tofu. One oz of lean protein is equal to: ? 1 oz of meat, chicken, or fish. ? 1 egg. ?  cup of tofu.  Eat some foods each day that contain healthy fats, such as avocado, nuts, seeds, and fish. Lifestyle  Check your blood glucose regularly.  Exercise regularly as told by your health care provider. This may include: ? 150 minutes of moderate-intensity or vigorous-intensity exercise each week. This could be brisk walking, biking, or water aerobics. ? Stretching and doing strength exercises, such as yoga or weightlifting, at least 2 times a week.  Take medicines as told by your health care provider.  Do not use any products that contain nicotine  or tobacco, such as cigarettes and e-cigarettes. If you need help quitting, ask your health care provider.  Work with a Social worker or diabetes educator to identify strategies to manage stress and any emotional and social challenges. Questions to ask a health care provider  Do I need to meet with a diabetes educator?  Do I need to meet with a dietitian?  What number can I call if I have questions?  When are the best times to check my blood glucose? Where to find more information:  American Diabetes Association: diabetes.org  Academy of Nutrition and Dietetics: www.eatright.[employer] of Diabetes and Digestive and Kidney Diseases (NIH): [employer] Summary  A healthy meal plan will help you control your blood glucose and maintain a healthy lifestyle.  Working with a diet and nutrition specialist (dietitian) can help you make  a meal plan that is best for you.  Keep in mind that carbohydrates (carbs) and alcohol have immediate effects on your blood glucose levels. It is important to count carbs and to use alcohol carefully. This information is not intended to replace advice given to you by your health care provider. Make sure you discuss any questions you have with your health care provider. Document Revised: 01/12/2017 Document Reviewed: 03/06/2016 Elsevier Patient Education  2020 [employer].

## 2019-10-30 NOTE — Chronic Care Management (AMB) (Signed)
Chronic Care Management Pharmacy  Name: Michelle Hicks  MRN: [id number] DOB: Jul 22, 1950  Chief Complaint/ HPI  Michelle Hicks,  69 y.o. , female presents for their Initial CCM visit with the clinical pharmacist via telephone due to COVID-19 Pandemic.  PCP : Glendale Chard, MD  Their chronic conditions include: Hypertension, Diabetes, GERD, Hypothyroidism  Office Visits: 09/24/19 Telephone call: Pt notified Ozempic 31m pens delivered from [employer]  08/27/19 OV: DM check. Pt has not received meds from patient assistance program yet. Provided with ozempic 0.594msamples until shipment arrives. Kidney function and HgbA1c stable.   07/10/19 AWV and OV: Presented for HTN and BP follow up. Labs ordered (BMP8+EGFR, HgbA1c). Pt experiencing constipation from Ozempic, but does not wish to stop medication. Advised pt to increase water intake and daily activity. Given samples of Linzess 14572m Advised to use Dulcolax suppository tonight. Using OTC medications for hemorrhoids, can send rx for Anusol to use prn.   04/15/19 OV: Presented for DM follow up. Pt was started on Ozempic at last visit, is now using 0.5mg38mce weekly on Tuesdays. No issues reported with medications. Increase Ozempic to 1mg 58me weekly. Encouraged to follow up after taking higher dose. Advised pt to start stool softener daily to help with constipation (pt reported this symptom after starting Ozempic). If persistent constipation pt can start Miralax.   03/18/19 OV: Presented for annual exam and HTN and DM follow up. Impacted cerumen and thyromegaly noted on exam. Diabetic foot exam performed. Pt reported that insurance no longer covers [place] Oz[place]pic 0.25mg 5mly for 2 weeks and then increase to 0.5mg we41my for 2 weeks. No family history of thyroid cancer. Follow up in 4 weeks. EKG performed, no acute changes. Both ears flushed by irrigation (ear lavage). Referred for thyroid  ultrasound d/t goiter. Labs ordered (CMP14+EGFR, CBC, Lipid panel, HgbA1c, TSH, UA, UA- microalbumin).  Consult Visits:  CCM Encounters: 07/24/19 SW: Nn SW needs indicated at this time  Medications: Outpatient Encounter Medications as of 10/30/2019  Medication Sig  . aspirin EC 81 MG tablet Take 81 mg by mouth daily.  . Cholecalciferol (VITAMIN D-3 PO) Take 2,000 Units by mouth daily.   . docusMarland Kitchente sodium (DULCOLAX) 100 MG capsule Take 200 mg by mouth daily as needed.   . FLUoxMarland Kitchentine (PROZAC) 20 MG capsule Take 20 mg by mouth daily. (Patient not taking: Reported on 08/29/2019)  . hydrochlorothiazide (HYDRODIURIL) 25 MG tablet TAKE 1 TABLET BY MOUTH  DAILY  . KOMBIGLYZE XR 2.06-998 MG TB24 TAKE 1 TABLET BY MOUTH  TWICE DAILY WITH MEALS (Patient not taking: Reported on 04/15/2019)  . Lancets Misc. (ONE TOUCH SURESOFT) MISC Use as directed to check blood sugars 2 times per day dx e11.65  . levothyroxine (SYNTHROID) 50 MCG tablet Take 50 mcg by mouth daily.   . meloxicam (MOBIC) 15 MG tablet TAKE 1 TABLET BY MOUTH  DAILY AS NEEDED  . Multiple Vitamin (MULTIVITAMIN) tablet Take 1 tablet by mouth daily.  . omeprMarland Kitchenzole (PRILOSEC) 40 MG capsule TAKE 1 CAPSULE BY MOUTH  EVERY DAY BEFORE A MEAL (Patient taking differently: TAKE 1 CAPSULE BY MOUTH  EVERY DAY BEFORE A MEAL AS NEEDED)  . ONETOUCH ULTRA test strip USE AS DIRECTED TO CHECK  BLOOD SUGARS TWICE DAILY  . pravastatin (PRAVACHOL) 40 MG tablet Take 1 tablet (40 mg total) by mouth daily. (Patient taking differently: Take 40 mg by mouth daily. Patient takes 1/2 tablet (20 mg))  . Probiotic  Product (PROBIOTIC DAILY PO) Take by mouth. (Patient not taking: Reported on 08/29/2019)  . Semaglutide, 1 MG/DOSE, (OZEMPIC, 1 MG/DOSE,) 2 MG/1.5ML SOPN Inject 1 mg into the skin once a week. (Patient taking differently: Inject 1 mg into the skin once a week. 0.5 mg)  . telmisartan (MICARDIS) 80 MG tablet TAKE 1 TABLET BY MOUTH  DAILY   No facility-administered  encounter medications on file as of 10/30/2019.    Current Diagnosis/Assessment:    Goals Addressed   None     Diabetes   Recent Relevant Labs: Lab Results  Component Value Date/Time   HGBA1C 7.2 (H) 08/27/2019 09:04 AM   HGBA1C 7.0 (H) 07/10/2019 10:42 AM   HGBA1C 7.3 09/04/2017 12:00 AM   MICROALBUR 30 03/18/2019 10:22 AM   MICROALBUR 30 02/28/2018 10:07 AM    Checking BG: Daily to 3x daily  Recent FBG Readings: usually 133, 165 Recent pre-meal BG readings: 113 Recent 2hr PP BG readings: 171, 158, 177 Recent HS BG readings:  Patient has failed these meds in past: [place] Patient is currently controlled on the following medications:   Ozempic 20m weekly  Last diabetic Foot exam: 03/18/19 Last diabetic Eye exam:  Lab Results  Component Value Date/Time   HMDIABEYEEXA No Retinopathy 09/04/2018 12:00 AM    We discussed:  . Diet extensively o Pt mentions eating some unhealthy foods (cake and chips) o Recommend pt try to eat healthy most of the time - Focus on a balanced diet and limiting portion sizes . Exercise extensively o Walks on the treadmill for 30 minutes and YouTube exercise videos for 10-15 minutes 3 times weekly o Pt says she is going to try to increase her exercise regimen to 4 times daily o Has been increasing slowly due to some soreness, but that has improved . Pt received Ozempic patient assistance delivery last month . FBG goal 80-130 . 2 HR PP goal <180   Plan Continue current medications    Hypertension   Office blood pressures are  BP Readings from Last 3 Encounters:  08/27/19 126/74  07/10/19 128/64  07/10/19 128/64    Patient has failed these meds in the past: Amlodipine, Losartan-HCTZ,  Patient is currently controlled on the following medications:   HCTZ 220mdaily  Telmisartan 8083maily  Patient checks BP at home weekly  Patient home BP readings are ranging: 110/69   We discussed:  Pt feels BP is good  Goal is  <130/80  Diet and exercise extensively  Denies headaches, dizziness, lightheadedness  Pt asked about what would constitute low BP  Discussed if DBP less than 65 then we would begin to be concerned about low BP  Advised pt to also assess how she is feeling if BP is lower than normal  Plan Continue current medications   Hyperlipidemia   Lipid Panel     Component Value Date/Time   CHOL 161 03/18/2019 1009   TRIG 78 03/18/2019 1009   HDL 68 03/18/2019 1009   LDLFair[place] 03/18/2019 1009    The 10-year ASCVD risk score (GoMikey Bussing Jr., et al., 2013) is: 16.9%   Values used to calculate the score:     Age[age]     Sex: Female     Is Non-Hispanic African American: Yes     Diabetic: Yes     Tobacco smoker: No     Systolic Blood Pressure: 126237Hg     Is BP treated: Yes     HDL Cholesterol: 68  mg/dL     Total Cholesterol: 161 mg/dL   Patient has failed these meds in past: [place] Patient is currently uncontrolled on the following medications:   Pravastatin 57m 1/2 tablet daily at bedtime  Plan Continue current medications  Consider increasing to 1 tablet daily pending next lipid panel results  Hypothyroidism   Lab Results  Component Value Date/Time   TSH 1.310 07/10/2019 10:42 AM   TSH 2.180 03/18/2019 10:09 AM   TSH 1.220 06/11/2018 09:44 AM   Patient has failed these meds in past: N/A Patient is currently controlled on the following medications:   Levothyroxine 519m daily  Plan Continue current medications  GERD   Patient has failed these meds in past: Nexium Patient is currently controlled on the following medications:   Omeprazole 4048maily  Plan Continue current medications  Depression   Patient has failed these meds in past: Celexa Patient is currently controlled on the following medications:   N/A (Was previously taking fluoxetine 10m83mily)  Plan Continue control with lifestyle  Constipation   Patient has failed these meds in past:  N/A Patient is currently controlled on the following medications:   Docusate 200mg42mly as needed  Phill[place]y care colon probiotic  We discussed:   Pt has taken the Linzess samples she was provided at a previous OV with PCP and feels like sometimes it helps and sometimes it doesn't  She mentioned concern about the cost of Linzess  Advised pt there was patient assistance available for Linzess and I would be happy to assist her with the application if she would like  Pt would like to wait on applying for the Linzess PAP and try the daily probiotic first  Advised pt to notify me if she changes her mind and wants help applying for Linzess PAP  Encouraged patient to stay hydrated and drink 64oz of water daily  Plan Continue current medications   Pain   Patient has failed these meds in past: N/A Patient is currently controlled on the following medications:   Meloxicam 15mg 46my as needed  We discussed:  Pt is taking meloxicam as needed for aches and pains  Taking maybe 4 days per week  Possible issues with meloxicam include kidney damage, increased blood pressure, etc  Recommend pt try Tylenol 500mg 261mlets as needed  Plan Start Tylenol 500mg 2 58mets as needed for pain, no more than 8 tablets daily  Vaccines   Reviewed and discussed patient's vaccination history.    Immunization History  Administered Date(s) Administered  . DTaP 02/28/2012  . Influenza, High Dose Seasonal PF 11/14/2017, 10/30/2018  . Influenza-Unspecified 11/14/2017, 10/30/2018  . Moderna SARS-COVID-2 Vaccination 03/28/2019, 04/25/2019  . Pneumococcal Conjugate-13 04/12/2018  . Pneumococcal-Unspecified 08/06/2013  . Zoster Recombinat (Shingrix) 11/14/2018   We discussed:  Pt states that she received 2nd dose of Shingrix vaccine this year  Confirmed 07/29/19 per [facility]  Time for patient to receive High dose flu vaccine. [facility] to get it at the office  Plan No recommendations at this time  Medication Management   [facility] for [facility] medications Uses pill box? Yes Pt endorses 95% compliance, misses 1-3 doses every couples of months  We discussed:   Importance of medication compliance  Plan Continue current medication management strategy  Follow up: 3 month phone visit  CourtneyJannette Fogo Clinical Pharmacist [facility] [phone]

## 2019-11-01 ENCOUNTER — Other Ambulatory Visit: Payer: Self-pay | Admitting: Internal Medicine

## 2019-11-07 ENCOUNTER — Telehealth: Payer: Medicare Other

## 2019-11-20 DIAGNOSIS — R1319 Other dysphagia: Secondary | ICD-10-CM | POA: Diagnosis not present

## 2019-11-22 ENCOUNTER — Other Ambulatory Visit: Payer: Self-pay | Admitting: Internal Medicine

## 2019-11-24 ENCOUNTER — Telehealth: Payer: Self-pay | Admitting: Pharmacist

## 2019-11-24 NOTE — Progress Notes (Signed)
° ° °  Chronic Care Management Pharmacy Assistant    Name: TYANNA HACH  MRN: [id number] DOB: 1950/06/21    Reason for Encounter: Medication Review - Patient Assistance Coordination  PCP : Glendale Chard, MD  Allergies:   Allergies  Allergen Reactions   Sulfa Antibiotics     Medications: Outpatient Encounter Medications as of 11/24/2019  Medication Sig   aspirin EC 81 MG tablet Take 81 mg by mouth daily.   Cholecalciferol (VITAMIN D-3 PO) Take 2,000 Units by mouth daily.    docusate sodium (DULCOLAX) 100 MG capsule Take 200 mg by mouth daily as needed.    FLUoxetine (PROZAC) 20 MG capsule Take 20 mg by mouth daily. (Patient not taking: Reported on 08/29/2019)   hydrochlorothiazide (HYDRODIURIL) 25 MG tablet TAKE 1 TABLET BY MOUTH  DAILY   KOMBIGLYZE XR 2.06-998 MG TB24 TAKE 1 TABLET BY MOUTH  TWICE DAILY WITH MEALS (Patient not taking: Reported on 04/15/2019)   Lancets Misc. (ONE TOUCH SURESOFT) MISC Use as directed to check blood sugars 2 times per day dx e11.65   levothyroxine (SYNTHROID) 50 MCG tablet Take 50 mcg by mouth daily.    meloxicam (MOBIC) 15 MG tablet TAKE 1 TABLET BY MOUTH  DAILY AS NEEDED   Multiple Vitamin (MULTIVITAMIN) tablet Take 1 tablet by mouth daily.   omeprazole (PRILOSEC) 40 MG capsule TAKE 1 CAPSULE BY MOUTH  EVERY DAY BEFORE A MEAL (Patient taking differently: TAKE 1 CAPSULE BY MOUTH  EVERY DAY BEFORE A MEAL AS NEEDED)   ONETOUCH ULTRA test strip USE AS DIRECTED TO CHECK  BLOOD SUGARS TWICE DAILY   pravastatin (PRAVACHOL) 40 MG tablet Take 1 tablet (40 mg total) by mouth daily. (Patient taking differently: Take 40 mg by mouth daily. Patient takes 1/2 tablet (20 mg))   Probiotic Product (PROBIOTIC DAILY PO) Take by mouth.    Semaglutide, 1 MG/DOSE, (OZEMPIC, 1 MG/DOSE,) 2 MG/1.5ML SOPN Inject 1 mg into the skin once a week.   telmisartan (MICARDIS) 80 MG tablet TAKE 1 TABLET BY MOUTH  DAILY   No facility-administered encounter medications  on file as of 11/24/2019.    Current Diagnosis: Patient Active Problem List   Diagnosis Date Noted   Class 3 severe obesity due to excess calories with serious comorbidity and body mass index (BMI) of 40.0 to 44.9 in adult [facility]) 03/18/2019   Essential hypertension, malignant 11/13/2017   Gastroesophageal reflux disease without esophagitis 11/13/2017   Hypertension 09/04/2017   Type II diabetes mellitus, uncontrolled ([place]) 09/04/2017      Follow-Up:  Patient Assistance Coordination -  Reorder form filled out to [employer] for Ozempic 1 mg /dose. Awaiting provider signature to fax.  12/04/2019 [place] spoke with Dest[facility], medications sent out today. Will be at PCP office within 10 -14 days.  Christian Davis,CPP. Notified.  Judithann Sheen, [facility] Clinical Pharmacist Assistant [phone]

## 2019-12-03 ENCOUNTER — Ambulatory Visit (INDEPENDENT_AMBULATORY_CARE_PROVIDER_SITE_OTHER): Payer: Medicare Other | Admitting: Internal Medicine

## 2019-12-03 ENCOUNTER — Other Ambulatory Visit: Payer: Self-pay

## 2019-12-03 ENCOUNTER — Encounter: Payer: Self-pay | Admitting: Internal Medicine

## 2019-12-03 VITALS — BP 110/76 | HR 83 | Temp 98.2°F | Ht 63.4 in | Wt 248.0 lb

## 2019-12-03 DIAGNOSIS — I10 Essential (primary) hypertension: Secondary | ICD-10-CM | POA: Diagnosis not present

## 2019-12-03 DIAGNOSIS — E1165 Type 2 diabetes mellitus with hyperglycemia: Secondary | ICD-10-CM

## 2019-12-03 DIAGNOSIS — E042 Nontoxic multinodular goiter: Secondary | ICD-10-CM | POA: Diagnosis not present

## 2019-12-03 DIAGNOSIS — Z6841 Body Mass Index (BMI) 40.0 and over, adult: Secondary | ICD-10-CM

## 2019-12-03 NOTE — Patient Instructions (Signed)
Diabetes Mellitus and Foot Care Foot care is an important part of your health, especially when you have diabetes. Diabetes may cause you to have problems because of poor blood flow (circulation) to your feet and legs, which can cause your skin to:  Become thinner and drier.  Break more easily.  Heal more slowly.  Peel and crack. You may also have nerve damage (neuropathy) in your legs and feet, causing decreased feeling in them. This means that you may not notice minor injuries to your feet that could lead to more serious problems. Noticing and addressing any potential problems early is the best way to prevent future foot problems. How to care for your feet Foot hygiene  Wash your feet daily with warm water and mild soap. Do not use hot water. Then, pat your feet and the areas between your toes until they are completely dry. Do not soak your feet as this can dry your skin.  Trim your toenails straight across. Do not dig under them or around the cuticle. File the edges of your nails with an emery board or nail file.  Apply a moisturizing lotion or petroleum jelly to the skin on your feet and to dry, brittle toenails. Use lotion that does not contain alcohol and is unscented. Do not apply lotion between your toes. Shoes and socks  Wear clean socks or stockings every day. Make sure they are not too tight. Do not wear knee-high stockings since they may decrease blood flow to your legs.  Wear shoes that fit properly and have enough cushioning. Always look in your shoes before you put them on to be sure there are no objects inside.  To break in new shoes, wear them for just a few hours a day. This prevents injuries on your feet. Wounds, scrapes, corns, and calluses  Check your feet daily for blisters, cuts, bruises, sores, and redness. If you cannot see the bottom of your feet, use a mirror or ask someone for help.  Do not cut corns or calluses or try to remove them with medicine.  If you  find a minor scrape, cut, or break in the skin on your feet, keep it and the skin around it clean and dry. You may clean these areas with mild soap and water. Do not clean the area with peroxide, alcohol, or iodine.  If you have a wound, scrape, corn, or callus on your foot, look at it several times a day to make sure it is healing and not infected. Check for: ? Redness, swelling, or pain. ? Fluid or blood. ? Warmth. ? Pus or a bad smell. General instructions  Do not cross your legs. This may decrease blood flow to your feet.  Do not use heating pads or hot water bottles on your feet. They may burn your skin. If you have lost feeling in your feet or legs, you may not know this is happening until it is too late.  Protect your feet from hot and cold by wearing shoes, such as at the beach or on hot pavement.  Schedule a complete foot exam at least once a year (annually) or more often if you have foot problems. If you have foot problems, report any cuts, sores, or bruises to your health care provider immediately. Contact a health care provider if:  You have a medical condition that increases your risk of infection and you have any cuts, sores, or bruises on your feet.  You have an injury that is not   healing.  You have redness on your legs or feet.  You feel burning or tingling in your legs or feet.  You have pain or cramps in your legs and feet.  Your legs or feet are numb.  Your feet always feel cold.  You have pain around a toenail. Get help right away if:  You have a wound, scrape, corn, or callus on your foot and: ? You have pain, swelling, or redness that gets worse. ? You have fluid or blood coming from the wound, scrape, corn, or callus. ? Your wound, scrape, corn, or callus feels warm to the touch. ? You have pus or a bad smell coming from the wound, scrape, corn, or callus. ? You have a fever. ? You have a red line going up your leg. Summary  Check your feet every day  for cuts, sores, red spots, swelling, and blisters.  Moisturize feet and legs daily.  Wear shoes that fit properly and have enough cushioning.  If you have foot problems, report any cuts, sores, or bruises to your health care provider immediately.  Schedule a complete foot exam at least once a year (annually) or more often if you have foot problems. This information is not intended to replace advice given to you by your health care provider. Make sure you discuss any questions you have with your health care provider. Document Revised: 10/23/2018 Document Reviewed: 03/03/2016 Elsevier Patient Education  2020 [employer]  

## 2019-12-03 NOTE — Progress Notes (Signed)
I,Katawbba Wiggins,acting as a Education administrator for [name], MD.,have documented all relevant documentation on the behalf of [name], MD,as directed by  [name], MD while in the presence of [name], MD.  This visit occurred during the SARS-CoV-2 public health emergency.  Safety protocols were in place, including screening questions prior to the visit, additional usage of staff PPE, and extensive cleaning of exam room while observing appropriate contact time as indicated for disinfecting solutions.  Subjective:     Patient ID: Michelle Hicks , female    DOB: 06/05/1950 , 69 y.o.   MRN: [id number]   Chief Complaint  Patient presents with  . Diabetes  . Hypertension    HPI  The patient is here today for a follow-up on her diabetes and blood pressure.  She reports compliance with meds, but admits that she has not been following dietary recommendations.   Diabetes She presents for her follow-up diabetic visit. She has type 2 diabetes mellitus. Her disease course has been stable. There are no hypoglycemic associated symptoms. There are no diabetic associated symptoms. Pertinent negatives for diabetes include no blurred vision and no chest pain. There are no hypoglycemic complications. Symptoms are stable. Risk factors for coronary artery disease include diabetes mellitus, obesity, sedentary lifestyle and post-menopausal. She is compliant with treatment most of the time. She is following a generally healthy diet. She participates in exercise intermittently. An ACE inhibitor/angiotensin II receptor blocker is being taken. Eye exam is current.  Hypertension This is a chronic problem. The current episode started more than 1 year ago. The problem has been gradually improving since onset. The problem is controlled. Pertinent negatives include no blurred vision, chest pain, palpitations or shortness of breath. The current treatment provides moderate improvement.     Past Medical  History:  Diagnosis Date  . Depression   . DM (diabetes mellitus) ([place])   . HTN (hypertension)      Family History  Problem Relation Age of Onset  . Diabetes Mother   . Pancreatic cancer Mother   . Hypertension Father   . Heart attack Father      Current Outpatient Medications:  .  aspirin EC 81 MG tablet, Take 81 mg by mouth daily. Some times, Disp: , Rfl:  .  Cholecalciferol (VITAMIN D-3 PO), Take 2,000 Units by mouth daily. , Disp: , Rfl:  .  docusate sodium (DULCOLAX) 100 MG capsule, Take 200 mg by mouth daily as needed. , Disp: , Rfl:  .  hydrochlorothiazide (HYDRODIURIL) 25 MG tablet, TAKE 1 TABLET BY MOUTH  DAILY, Disp: 90 tablet, Rfl: 3 .  Lancets Misc. (ONE TOUCH SURESOFT) MISC, Use as directed to check blood sugars 2 times per day dx e11.65, Disp: 300 each, Rfl: 2 .  levothyroxine (SYNTHROID) 50 MCG tablet, Take 50 mcg by mouth daily. , Disp: , Rfl:  .  meloxicam (MOBIC) 15 MG tablet, TAKE 1 TABLET BY MOUTH  DAILY AS NEEDED, Disp: 90 tablet, Rfl: 3 .  Multiple Vitamin (MULTIVITAMIN) tablet, Take 1 tablet by mouth daily., Disp: , Rfl:  .  omeprazole (PRILOSEC) 40 MG capsule, TAKE 1 CAPSULE BY MOUTH  EVERY DAY BEFORE A MEAL (Patient taking differently: TAKE 1 CAPSULE BY MOUTH  EVERY DAY BEFORE A MEAL AS NEEDED), Disp: 90 capsule, Rfl: 2 .  ONETOUCH ULTRA test strip, USE AS DIRECTED TO CHECK  BLOOD SUGARS TWICE DAILY, Disp: 150 strip, Rfl: 3 .  pravastatin (PRAVACHOL) 40 MG tablet, Take 1  tablet (40 mg total) by mouth daily. (Patient taking differently: Take 40 mg by mouth daily. Patient takes 1/2 tablet (20 mg)), Disp: 90 tablet, Rfl: 2 .  Probiotic Product (PROBIOTIC DAILY PO), Take by mouth. , Disp: , Rfl:  .  Semaglutide, 1 MG/DOSE, (OZEMPIC, 1 MG/DOSE,) 2 MG/1.5ML SOPN, Inject 1 mg into the skin once a week., Disp: 6 pen, Rfl: 1 .  telmisartan (MICARDIS) 80 MG tablet, TAKE 1 TABLET BY MOUTH  DAILY, Disp: 90 tablet, Rfl: 3   Allergies  Allergen Reactions  . Sulfa  Antibiotics      Review of Systems  Constitutional: Negative.   Eyes: Negative for blurred vision.  Respiratory: Negative.  Negative for shortness of breath.   Cardiovascular: Negative.  Negative for chest pain and palpitations.  Gastrointestinal: Negative.   Psychiatric/Behavioral: Negative.   All other systems reviewed and are negative.    Today's Vitals   12/03/19 0925  BP: 110/76  Pulse: 83  Temp: 98.2 F (36.8 C)  TempSrc: Oral  Weight: 248 lb (112.5 kg)  Height: 5' 3.4" (1.61 m)   Body mass index is 43.38 kg/m.  Wt Readings from Last 3 Encounters:  12/03/19 248 lb (112.5 kg)  08/27/19 246 lb 9.6 oz (111.9 kg)  07/10/19 244 lb 12.8 oz (111 kg)   Objective:  Physical Exam Vitals and nursing note reviewed. Exam conducted with a chaperone present.  Constitutional:      Appearance: Normal appearance. She is obese.  HENT:     Head: Normocephalic and atraumatic.  Neck:     Thyroid: Thyromegaly present.     Comments: Prominent nodules on right Cardiovascular:     Rate and Rhythm: Normal rate and regular rhythm.     Heart sounds: Normal heart sounds.  Pulmonary:     Breath sounds: Normal breath sounds.  Skin:    General: Skin is warm.  Neurological:     General: No focal deficit present.     Mental Status: She is alert and oriented to person, place, and time.         Assessment And Plan:     1. Uncontrolled type 2 diabetes mellitus with hyperglycemia [facility]) Comments: She reports she is scheduled for eye exam Nov 2021. I will check labs as listed below. Encouraged to get back on her eating plan.  - CMP14+EGFR - Hemoglobin A1c  2. Essential hypertension, benign Comments: Chronic, well controlled. She will continue with current meds. Encouraged to avoid adding salt to her foods.   3. Multinodular goiter Comments: Declined referral back to Dr. Chalmers Cater, sounds is if she may contact her directly. Previous 2/21 ultrasound results reviewed in detail. Addendum: pt  changed her mind and agrees with referral. Biopsy results from 2011 were also discussed in full detail.  - Ambulatory referral to Endocrinology  4. Class 3 severe obesity due to excess calories with serious comorbidity and body mass index (BMI) of 40.0 to 44.9 in adult (HCC) BMI 43.  She is encouraged to strive for BMI less than 30 to decrease cardiac risk. Advised to aim for at least 150 minutes of exercise per week.    Patient was given opportunity to ask questions. Patient verbalized understanding of the plan and was able to repeat key elements of the plan. All questions were answered to their satisfaction.  [name], MD   I, [name], MD, have reviewed all documentation for this visit. The documentation on 12/03/19 for the exam, diagnosis, procedures, and orders  are all accurate and complete.  THE PATIENT IS ENCOURAGED TO PRACTICE SOCIAL DISTANCING DUE TO THE COVID-19 PANDEMIC.

## 2019-12-04 LAB — CMP14+EGFR
ALT: 35 IU/L — ABNORMAL HIGH (ref 0–32)
AST: 30 IU/L (ref 0–40)
Albumin/Globulin Ratio: 1.3 (ref 1.2–2.2)
Albumin: 4.3 g/dL (ref 3.8–4.8)
Alkaline Phosphatase: 93 IU/L (ref 44–121)
BUN/Creatinine Ratio: 13 (ref 12–28)
BUN: 9 mg/dL (ref 8–27)
Bilirubin Total: 0.2 mg/dL (ref 0.0–1.2)
CO2: 20 mmol/L (ref 20–29)
Calcium: 9.6 mg/dL (ref 8.7–10.3)
Chloride: 102 mmol/L (ref 96–106)
Creatinine, Ser: 0.71 mg/dL (ref 0.57–1.00)
GFR calc Af Amer: 102 mL/min/{1.73_m2} (ref >59)
GFR calc non Af Amer: 88 mL/min/{1.73_m2} (ref >59)
Globulin, Total: 3.3 g/dL (ref 1.5–4.5)
Glucose: 107 mg/dL — ABNORMAL HIGH (ref 65–99)
Potassium: 4.2 mmol/L (ref 3.5–5.2)
Sodium: 139 mmol/L (ref 134–144)
Total Protein: 7.6 g/dL (ref 6.0–8.5)

## 2019-12-04 LAB — HEMOGLOBIN A1C
Est. average glucose Bld gHb Est-mCnc: 177 mg/dL
Hgb A1c MFr Bld: 7.8 % — ABNORMAL HIGH (ref 4.8–5.6)

## 2019-12-15 ENCOUNTER — Telehealth: Payer: Self-pay

## 2019-12-15 ENCOUNTER — Telehealth: Payer: Medicare Other

## 2019-12-15 NOTE — Telephone Encounter (Cosign Needed)
  Chronic Care Management   Outreach Note  12/15/2019 Name: Michelle Hicks MRN: [id number] DOB: Jun 03, 1950  Referred by: Glendale Chard, MD Reason for referral : Chronic Care Management (CCM RNCM FU Call )   An unsuccessful telephone outreach was attempted today. The patient was referred to the case management team for assistance with care management and care coordination.   Follow Up Plan: A HIPAA compliant phone message was left for the patient providing contact information and requesting a return call.  Telephone follow up appointment with care management team member scheduled for: 01/15/20  Barb Merino, RN, BSN, CCM  Care Management Coordinator [place] [facility]  Direct Phone: [phone]

## 2019-12-22 DIAGNOSIS — Z1159 Encounter for screening for other viral diseases: Secondary | ICD-10-CM | POA: Diagnosis not present

## 2019-12-25 DIAGNOSIS — K2289 Other specified disease of esophagus: Secondary | ICD-10-CM | POA: Diagnosis not present

## 2019-12-25 DIAGNOSIS — Z86010 Personal history of colonic polyps: Secondary | ICD-10-CM | POA: Diagnosis not present

## 2019-12-25 DIAGNOSIS — K449 Diaphragmatic hernia without obstruction or gangrene: Secondary | ICD-10-CM | POA: Diagnosis not present

## 2019-12-25 DIAGNOSIS — K648 Other hemorrhoids: Secondary | ICD-10-CM | POA: Diagnosis not present

## 2019-12-25 DIAGNOSIS — K2930 Chronic superficial gastritis without bleeding: Secondary | ICD-10-CM | POA: Diagnosis not present

## 2019-12-25 DIAGNOSIS — D123 Benign neoplasm of transverse colon: Secondary | ICD-10-CM | POA: Diagnosis not present

## 2019-12-25 DIAGNOSIS — K222 Esophageal obstruction: Secondary | ICD-10-CM | POA: Diagnosis not present

## 2019-12-25 DIAGNOSIS — R1310 Dysphagia, unspecified: Secondary | ICD-10-CM | POA: Diagnosis not present

## 2019-12-25 DIAGNOSIS — K3189 Other diseases of stomach and duodenum: Secondary | ICD-10-CM | POA: Diagnosis not present

## 2019-12-25 DIAGNOSIS — K5730 Diverticulosis of large intestine without perforation or abscess without bleeding: Secondary | ICD-10-CM | POA: Diagnosis not present

## 2019-12-25 DIAGNOSIS — K635 Polyp of colon: Secondary | ICD-10-CM | POA: Diagnosis not present

## 2019-12-25 LAB — HM COLONOSCOPY

## 2019-12-30 ENCOUNTER — Telehealth: Payer: Self-pay

## 2019-12-30 NOTE — Chronic Care Management (AMB) (Signed)
Chronic Care Management Pharmacy Assistant   Name: MARJORY MEINTS  MRN: [id number] DOB: [birth year]  Reason for Encounter: Disease State- Diabetes, Hypertension and Lipids Adherence Call.  Patient Questions:  1.  Have you seen any other providers since your last visit? Yes 12/03/2019- Robyn Sanders,MD - PCP-  2.  Any changes in your medicines or health? No     PCP : Glendale Chard, MD  Allergies:   Allergies  Allergen Reactions  . Sulfa Antibiotics     Medications: Outpatient Encounter Medications as of 12/30/2019  Medication Sig  . aspirin EC 81 MG tablet Take 81 mg by mouth daily. Some times  . Cholecalciferol (VITAMIN D-3 PO) Take 2,000 Units by mouth daily.   Marland Kitchen docusate sodium (DULCOLAX) 100 MG capsule Take 200 mg by mouth daily as needed.   . hydrochlorothiazide (HYDRODIURIL) 25 MG tablet TAKE 1 TABLET BY MOUTH  DAILY  . Lancets Misc. (ONE TOUCH SURESOFT) MISC Use as directed to check blood sugars 2 times per day dx e11.65  . levothyroxine (SYNTHROID) 50 MCG tablet Take 50 mcg by mouth daily.   . meloxicam (MOBIC) 15 MG tablet TAKE 1 TABLET BY MOUTH  DAILY AS NEEDED  . Multiple Vitamin (MULTIVITAMIN) tablet Take 1 tablet by mouth daily.  Marland Kitchen omeprazole (PRILOSEC) 40 MG capsule TAKE 1 CAPSULE BY MOUTH  EVERY DAY BEFORE A MEAL (Patient taking differently: TAKE 1 CAPSULE BY MOUTH  EVERY DAY BEFORE A MEAL AS NEEDED)  . ONETOUCH ULTRA test strip USE AS DIRECTED TO CHECK  BLOOD SUGARS TWICE DAILY  . pravastatin (PRAVACHOL) 40 MG tablet Take 1 tablet (40 mg total) by mouth daily. (Patient taking differently: Take 40 mg by mouth daily. Patient takes 1/2 tablet (20 mg))  . Probiotic Product (PROBIOTIC DAILY PO) Take by mouth.   . Semaglutide, 1 MG/DOSE, (OZEMPIC, 1 MG/DOSE,) 2 MG/1.5ML SOPN Inject 1 mg into the skin once a week.  . telmisartan (MICARDIS) 80 MG tablet TAKE 1 TABLET BY MOUTH  DAILY   No facility-administered encounter medications on file as of 12/30/2019.     Current Diagnosis: Patient Active Problem List   Diagnosis Date Noted  . Class 3 severe obesity due to excess calories with serious comorbidity and body mass index (BMI) of 40.0 to 44.9 in adult ([place]) 03/18/2019  . Essential hypertension, malignant 11/13/2017  . Gastroesophageal reflux disease without esophagitis 11/13/2017  . Hypertension 09/04/2017  . Type II diabetes mellitus, uncontrolled ([place]) 09/04/2017   Reviewed chart prior to disease state call. Spoke with patient regarding BP  Recent Office Vitals: BP Readings from Last 3 Encounters:  12/03/19 110/76  08/27/19 126/74  07/10/19 128/64   Pulse Readings from Last 3 Encounters:  12/03/19 83  08/27/19 88  07/10/19 90    Wt Readings from Last 3 Encounters:  12/03/19 248 lb (112.5 kg)  08/27/19 246 lb 9.6 oz (111.9 kg)  07/10/19 244 lb 12.8 oz (111 kg)     Kidney Function Lab Results  Component Value Date/Time   CREATININE 0.71 12/03/2019 09:58 AM   CREATININE 0.82 08/27/2019 09:04 AM   GFRNONAA 88 12/03/2019 09:58 AM   GFRAA 102 12/03/2019 09:58 AM    BMP Latest Ref Rng & Units 12/03/2019 08/27/2019 07/10/2019  Glucose 65 - 99 mg/dL 107(H) 127(H) 116(H)  BUN 8 - 27 mg/dL 9 12 11   Creatinine 0.57 - 1.00 mg/dL 0.71 0.82 0.75  BUN/Creat Ratio 12 - 28 13 15 15   Sodium 134 - 144 mmol/L  139 137 140  Potassium 3.5 - 5.2 mmol/L 4.2 4.2 4.4  Chloride 96 - 106 mmol/L 102 99 102  CO2 20 - 29 mmol/L 20 25 22   Calcium 8.7 - 10.3 mg/dL 9.6 9.9 9.8    . Current antihypertensive regimen:  o Hydrochlorothiazide 25mg  daily o Telmisartan 80mg  daily  . How often are you checking your Blood Pressure? Patient states she takes her blood pressure once a week.   . Current home BP readings: 120/86  . What recent interventions/DTPs have been made by any provider to improve Blood Pressure control since last CPP Visit: Patient states she is taking her medications as directed by provider. Patient states she has been taking her  medications as directed by provider.  . Any recent hospitalizations or ED visits since last visit with CPP? No  . What diet changes have been made to improve Blood Pressure Control?  o Patient states she has been eating healthier and has done much better with her salt intake , no fried foods.  . What exercise is being done to improve your Blood Pressure Control?  o Patient has been doing a exercise regiment two days a week, and stays active the other days.    Adherence Review: Is the patient currently on ACE/ARB medication? No Does the patient have >5 day gap between last estimated fill dates? No  Recent Relevant Labs: Lab Results  Component Value Date/Time   HGBA1C 7.8 (H) 12/03/2019 09:58 AM   HGBA1C 7.2 (H) 08/27/2019 09:04 AM   HGBA1C 7.3 09/04/2017 12:00 AM   MICROALBUR 30 03/18/2019 10:22 AM   MICROALBUR 30 02/28/2018 10:07 AM    Kidney Function Lab Results  Component Value Date/Time   CREATININE 0.71 12/03/2019 09:58 AM   CREATININE 0.82 08/27/2019 09:04 AM   GFRNONAA 88 12/03/2019 09:58 AM   GFRAA 102 12/03/2019 09:58 AM    . Current antihyperglycemic regimen:  o Ozempic 1 mg into the skin once a week  . What recent interventions/DTPs have been made to improve glycemic control:  o Patient states she has been taking her medications as directed by provider.  . Have there been any recent hospitalizations or ED visits since last visit with CPP? No  . Patient denies hypoglycemic symptoms, including Pale, Sweaty, Shaky, Hungry, Nervous/irritable and Vision changes . Patient denies hyperglycemic symptoms, including blurry vision, excessive thirst, fatigue, polyuria and weakness . How often are you checking your blood sugar? Patient states she does checks her blood sugar once day , some times more depends on what she eats.  . What are your blood sugars ranging?  o Fasting: 12/31/2019 - 67 o Before meals: None o After meals: None o Bedtime:  12/30/2019  166  . During the week, how often does your blood glucose drop below 70?  No  . Are you checking your feet daily/regularly? Patient states she has been checking her feet regularly, no open sores , no pain , states she does have some numbness in feet.   Adherence Review: Is the patient currently on a STATIN medication? Yes  Is the patient currently on ACE/ARB medication? No Does the patient have >5 day gap between last estimated fill dates?No   Comprehensive medication review performed; Spoke to patient regarding cholesterol  Lipid Panel    Component Value Date/Time   CHOL 161 03/18/2019 1009   TRIG 78 03/18/2019 1009   HDL 68 03/18/2019 1009   LDLCALC 78 03/18/2019 1009    10-year ASCVD risk score: The  10-year ASCVD risk score Mikey Bussing DC Brooke Bonito., et al., 2013) is: 13.6%   Values used to calculate the score:     Age: 34 years     Sex: Female     Is Non-Hispanic African American: Yes     Diabetic: Yes     Tobacco smoker: No     Systolic Blood Pressure: 952 mmHg     Is BP treated: Yes     HDL Cholesterol: 68 mg/dL     Total Cholesterol: 161 mg/dL  . Current antihyperlipidemic regimen:  o Pravastatin 40 mg one a day . Previous antihyperlipidemic medications tried: none  . ASCVD risk enhancing conditions: age >32, DM and HTN   . What recent interventions/DTPs have been made by any provider to improve Cholesterol control since last CPP Visit:  Patient states she is taking her medications as directed by provider.  . Any recent hospitalizations or ED visits since last visit with CPP? No  . What diet changes have been made to improve Cholesterol?  o Patient states she eats bake foods, no red meat, states she has been doing a lot better with her salt intake. . What exercise is being done to improve Cholesterol?  o Patient has been doing a exercise regiment two days a week, and stays active the other days.  Adherence Review: Does the patient have >5 day gap between last estimated  fill dates? No         Goals Addressed            This Visit's Progress   . Pharmacy Care Plan   On track    CARE PLAN ENTRY (see longitudinal plan of care for additional care plan information)  Current Barriers:  . Chronic Disease Management support, education, and care coordination needs related to Hypertension, Hyperlipidemia, Diabetes, and Hypothyroidism   Hypertension BP Readings from Last 3 Encounters:  08/27/19 126/74  07/10/19 128/64  07/10/19 128/64   . Pharmacist Clinical Goal(s): o Over the next 180 days, patient will work with PharmD and providers to maintain BP goal <130/80 . Current regimen:  o Hydrochlorothiazide 25mg  daily o Telmisartan 80mg  daily . Interventions: o Provided dietary and exercise recommendations o Discussed what symptoms to watch for as a sign of low blood pressure (dizziness, lightheadedness, fainting, etc) o Discussed appropriate goal for blood pressure (less than 130/80) . Patient self care activities - Over the next 180 days, patient will: o Check BP weekly, document, and provide at future appointments o Ensure daily salt intake < 2300 mg/day o Increase exercise to 30 minutes 5 times weekly (150 minutes per week) o Try Tylenol for pain instead of meloxicam  Hyperlipidemia Lab Results  Component Value Date/Time   LDLCALC 78 03/18/2019 10:09 AM   . Pharmacist Clinical Goal(s): o Over the next 180 days, patient will work with PharmD and providers to achieve LDL goal < 70 . Current regimen:  o Pravastatin 40mg  1/2 tablet daily . Interventions: o Provided dietary and exercise recommendations o Suggested patient keep pravastatin on nightstand to help prevent missed doses o Monitor future lipid panel to determine if pravastatin dose may need to be increased . Patient self care activities - Over the next 90 days, patient will: o Keep pravastatin by bed to help you remember to take it  o Take pravastatin daily as  directed o Increase exercise to 30 minutes 5 times weekly  Diabetes Lab Results  Component Value Date/Time   HGBA1C 7.2 (H) 08/27/2019 09:04 AM  HGBA1C 7.0 (H) 07/10/2019 10:42 AM   HGBA1C 7.3 09/04/2017 12:00 AM   . Pharmacist Clinical Goal(s): o Over the next 90 days, patient will work with PharmD and providers to achieve A1c goal <7% . Current regimen:  o Ozempic 1mg  weekly . Interventions: o Ozempic patient assistance was approved and delivered o Provided dietary and exercise recommendations o Discussed appropriate goals for fasting blood sugar (80-130) and 2 hours after eating (less than 180) o Congratulated patient on increased exercise . Patient self care activities - Over the next 90 days, patient will: o Check blood sugar twice daily, document, and provide at future appointments o Contact provider with any episodes of hypoglycemia o Drink 64 ounces of water daily o Increase exercise to 30 minutes 5 times weekly (150 minutes per week)  Hypothyroidism . Pharmacist Clinical Goal(s) o Over the next 180 days, patient will work with PharmD and providers to keep thyroid hormones within normal limits . Current regimen:  o Levothyroxine 26mcg daily . Interventions: o Discussed appropriate administration of levothyroxine . Patient self care activities - Over the next 180 days, patient will: o Continue levothyroxine daily as directed  Medication management . Pharmacist Clinical Goal(s): o Over the next 90 days, patient will work with PharmD and providers to maintain optimal medication adherence . Current pharmacy: [facility] . Interventions o Comprehensive medication review performed. o Continue current medication management strategy . Patient self care activities - Over the next 90 days, patient will: o Focus on medication adherence by using pill box and keeping evening medications by bed to minimize forgotten doses o Take medications as prescribed o Report any  questions or concerns to PharmD and/or provider(s)  Please see past updates related to this goal by clicking on the "Past Updates" button in the selected goal         Follow-Up:  Pharmacist Review   Orlando Penner, CPP Notified  Judithann Sheen, [place] Pharmacist Assistant [phone]

## 2019-12-31 DIAGNOSIS — D123 Benign neoplasm of transverse colon: Secondary | ICD-10-CM | POA: Diagnosis not present

## 2019-12-31 DIAGNOSIS — K2289 Other specified disease of esophagus: Secondary | ICD-10-CM | POA: Diagnosis not present

## 2019-12-31 DIAGNOSIS — K635 Polyp of colon: Secondary | ICD-10-CM | POA: Diagnosis not present

## 2019-12-31 DIAGNOSIS — K2930 Chronic superficial gastritis without bleeding: Secondary | ICD-10-CM | POA: Diagnosis not present

## 2020-01-06 DIAGNOSIS — E049 Nontoxic goiter, unspecified: Secondary | ICD-10-CM | POA: Diagnosis not present

## 2020-01-06 DIAGNOSIS — E7800 Pure hypercholesterolemia, unspecified: Secondary | ICD-10-CM | POA: Diagnosis not present

## 2020-01-06 DIAGNOSIS — E039 Hypothyroidism, unspecified: Secondary | ICD-10-CM | POA: Diagnosis not present

## 2020-01-06 DIAGNOSIS — E1165 Type 2 diabetes mellitus with hyperglycemia: Secondary | ICD-10-CM | POA: Diagnosis not present

## 2020-01-06 DIAGNOSIS — I10 Essential (primary) hypertension: Secondary | ICD-10-CM | POA: Diagnosis not present

## 2020-01-12 DIAGNOSIS — E119 Type 2 diabetes mellitus without complications: Secondary | ICD-10-CM | POA: Diagnosis not present

## 2020-01-12 DIAGNOSIS — H47323 Drusen of optic disc, bilateral: Secondary | ICD-10-CM | POA: Diagnosis not present

## 2020-01-12 DIAGNOSIS — H2513 Age-related nuclear cataract, bilateral: Secondary | ICD-10-CM | POA: Diagnosis not present

## 2020-01-12 DIAGNOSIS — H16223 Keratoconjunctivitis sicca, not specified as Sjogren's, bilateral: Secondary | ICD-10-CM | POA: Diagnosis not present

## 2020-01-12 LAB — HM DIABETES EYE EXAM

## 2020-01-14 ENCOUNTER — Encounter: Payer: Self-pay | Admitting: Internal Medicine

## 2020-01-15 ENCOUNTER — Telehealth: Payer: Self-pay

## 2020-01-15 ENCOUNTER — Telehealth: Payer: Medicare Other

## 2020-01-15 NOTE — Telephone Encounter (Cosign Needed)
  Chronic Care Management   Outreach Note  01/15/2020 Name: Michelle Hicks MRN: [id number] DOB: 1950/07/23  Referred by: Glendale Chard, MD Reason for referral : No chief complaint on file.   An unsuccessful telephone outreach was attempted today. The patient was referred to the case management team for assistance with care management and care coordination.   Follow Up Plan: A HIPAA compliant phone message was left for the patient providing contact information and requesting a return call. Telephone follow up appointment with care management team member scheduled for: 02/23/20  Barb Merino, RN, BSN, CCM Care Management Coordinator [place] [facility]  Direct Phone: [phone]

## 2020-01-28 DIAGNOSIS — M1711 Unilateral primary osteoarthritis, right knee: Secondary | ICD-10-CM | POA: Diagnosis not present

## 2020-01-29 ENCOUNTER — Telehealth: Payer: Self-pay

## 2020-02-09 ENCOUNTER — Telehealth: Payer: Self-pay

## 2020-02-09 ENCOUNTER — Other Ambulatory Visit: Payer: Self-pay | Admitting: Internal Medicine

## 2020-02-09 NOTE — Progress Notes (Signed)
    Chronic Care Management Pharmacy Assistant   Name: Michelle Hicks  MRN: [id number] DOB: September 20, 1950  Reason for Encounter: Medication Review  .  PCP : Dorothyann Peng, MD  Allergies:   Allergies  Allergen Reactions  . Sulfa Antibiotics     Medications: Outpatient Encounter Medications as of 02/09/2020  Medication Sig  . aspirin EC 81 MG tablet Take 81 mg by mouth daily. Some times  . Cholecalciferol (VITAMIN D-3 PO) Take 2,000 Units by mouth daily.   Marland Kitchen docusate sodium (DULCOLAX) 100 MG capsule Take 200 mg by mouth daily as needed.   . hydrochlorothiazide (HYDRODIURIL) 25 MG tablet TAKE 1 TABLET BY MOUTH  DAILY  . Lancets Misc. (ONE TOUCH SURESOFT) MISC Use as directed to check blood sugars 2 times per day dx e11.65  . levothyroxine (SYNTHROID) 50 MCG tablet Take 50 mcg by mouth daily.   . meloxicam (MOBIC) 15 MG tablet TAKE 1 TABLET BY MOUTH  DAILY AS NEEDED  . Multiple Vitamin (MULTIVITAMIN) tablet Take 1 tablet by mouth daily.  Marland Kitchen omeprazole (PRILOSEC) 40 MG capsule TAKE 1 CAPSULE BY MOUTH  EVERY DAY BEFORE A MEAL (Patient taking differently: TAKE 1 CAPSULE BY MOUTH  EVERY DAY BEFORE A MEAL AS NEEDED)  . ONETOUCH ULTRA test strip USE AS DIRECTED TO CHECK  BLOOD SUGARS TWICE DAILY  . pravastatin (PRAVACHOL) 40 MG tablet Take 1 tablet (40 mg total) by mouth daily. (Patient taking differently: Take 40 mg by mouth daily. Patient takes 1/2 tablet (20 mg))  . Probiotic Product (PROBIOTIC DAILY PO) Take by mouth.   . Semaglutide, 1 MG/DOSE, (OZEMPIC, 1 MG/DOSE,) 2 MG/1.5ML SOPN Inject 1 mg into the skin once a week.  . telmisartan (MICARDIS) 80 MG tablet TAKE 1 TABLET BY MOUTH  DAILY   No facility-administered encounter medications on file as of 02/09/2020.    Current Diagnosis: Patient Active Problem List   Diagnosis Date Noted  . Class 3 severe obesity due to excess calories with serious comorbidity and body mass index (BMI) of 40.0 to 44.9 in adult (HCC) 03/18/2019  .  Essential hypertension, malignant 11/13/2017  . Gastroesophageal reflux disease without esophagitis 11/13/2017  . Hypertension 09/04/2017  . Type II diabetes mellitus, uncontrolled (HCC) 09/04/2017      Follow-Up:  Patient Assistance Coordination -  New patient assistance application filled out to  [employer] for [employer].  Waiting for provider and patient's signature.  Vallie Pearson,CPP Notified  Jon Gills, [facility] Clinical Pharmacist Assistant [phone]

## 2020-02-11 ENCOUNTER — Telehealth: Payer: Self-pay

## 2020-02-11 NOTE — Chronic Care Management (AMB) (Signed)
° ° °  Chronic Care Management Pharmacy Assistant   Name: Michelle Hicks  MRN: [id number] DOB: 07-21-1950  Reason for Encounter: Patient Assistance Coordination   PCP : Dorothyann Peng, MD  Allergies:   Allergies  Allergen Reactions   Sulfa Antibiotics     Medications: Outpatient Encounter Medications as of 02/11/2020  Medication Sig   aspirin EC 81 MG tablet Take 81 mg by mouth daily. Some times   Cholecalciferol (VITAMIN D-3 PO) Take 2,000 Units by mouth daily.    docusate sodium (DULCOLAX) 100 MG capsule Take 200 mg by mouth daily as needed.    hydrochlorothiazide (HYDRODIURIL) 25 MG tablet TAKE 1 TABLET BY MOUTH  DAILY   Lancets Misc. (ONE TOUCH SURESOFT) MISC Use as directed to check blood sugars 2 times per day dx e11.65   levothyroxine (SYNTHROID) 50 MCG tablet Take 50 mcg by mouth daily.    meloxicam (MOBIC) 15 MG tablet TAKE 1 TABLET BY MOUTH  DAILY AS NEEDED   Multiple Vitamin (MULTIVITAMIN) tablet Take 1 tablet by mouth daily.   omeprazole (PRILOSEC) 40 MG capsule TAKE 1 CAPSULE BY MOUTH  DAILY BEFORE A MEAL   ONETOUCH ULTRA test strip USE AS DIRECTED TO CHECK  BLOOD SUGARS TWICE DAILY   pravastatin (PRAVACHOL) 40 MG tablet Take 1 tablet (40 mg total) by mouth daily. (Patient taking differently: Take 40 mg by mouth daily. Patient takes 1/2 tablet (20 mg))   Probiotic Product (PROBIOTIC DAILY PO) Take by mouth.    Semaglutide, 1 MG/DOSE, (OZEMPIC, 1 MG/DOSE,) 2 MG/1.5ML SOPN Inject 1 mg into the skin once a week.   telmisartan (MICARDIS) 80 MG tablet TAKE 1 TABLET BY MOUTH  DAILY   No facility-administered encounter medications on file as of 02/11/2020.    Current Diagnosis: Patient Active Problem List   Diagnosis Date Noted   Class 3 severe obesity due to excess calories with serious comorbidity and body mass index (BMI) of 40.0 to 44.9 in adult [facility]) 03/18/2019   Essential hypertension, malignant 11/13/2017   Gastroesophageal reflux disease  without esophagitis 11/13/2017   Hypertension 09/04/2017   Type II diabetes mellitus, uncontrolled (HCC) 09/04/2017     Follow-Up:  Patient Assistance Coordination and Pharmacist Review-Called and spoke to the patient regarding her renewed patient assistance application of Ozempic for 2022 that is awaiting her signature. Patient verbalized understanding. Patient stated she will come into the office (TIMA) tomorrow 02/12/20 for completion. Notified Cherylin Mylar, CPP.  Suezanne Cheshire, [facility] Clinical Pharmacist Assistant [phone]

## 2020-02-20 ENCOUNTER — Telehealth: Payer: Self-pay

## 2020-02-20 NOTE — Chronic Care Management (AMB) (Signed)
Chronic Care Management Pharmacy Assistant   Name: Michelle Hicks  MRN: [id number] DOB: 01-Mar-1950  Reason for Encounter: Disease State - Diabetes , Hypertension and Lipid Adherence Call.  Patient Questions:   1.  Have you seen any other providers since your last visit? No   2.  Any changes in your medicines or health? No   .  PCP : Glendale Chard, MD  Allergies:   Allergies  Allergen Reactions   Sulfa Antibiotics     Medications: Outpatient Encounter Medications as of 02/20/2020  Medication Sig   aspirin EC 81 MG tablet Take 81 mg by mouth daily. Some times   Cholecalciferol (VITAMIN D-3 PO) Take 2,000 Units by mouth daily.    docusate sodium (DULCOLAX) 100 MG capsule Take 200 mg by mouth daily as needed.    hydrochlorothiazide (HYDRODIURIL) 25 MG tablet TAKE 1 TABLET BY MOUTH  DAILY   Lancets Misc. (ONE TOUCH SURESOFT) MISC Use as directed to check blood sugars 2 times per day dx e11.65   levothyroxine (SYNTHROID) 50 MCG tablet Take 50 mcg by mouth daily.    meloxicam (MOBIC) 15 MG tablet TAKE 1 TABLET BY MOUTH  DAILY AS NEEDED   Multiple Vitamin (MULTIVITAMIN) tablet Take 1 tablet by mouth daily.   omeprazole (PRILOSEC) 40 MG capsule TAKE 1 CAPSULE BY MOUTH  DAILY BEFORE A MEAL   ONETOUCH ULTRA test strip USE AS DIRECTED TO CHECK  BLOOD SUGARS TWICE DAILY   pravastatin (PRAVACHOL) 40 MG tablet Take 1 tablet (40 mg total) by mouth daily. (Patient taking differently: Take 40 mg by mouth daily. Patient takes 1/2 tablet (20 mg))   Probiotic Product (PROBIOTIC DAILY PO) Take by mouth.    Semaglutide, 1 MG/DOSE, (OZEMPIC, 1 MG/DOSE,) 2 MG/1.5ML SOPN Inject 1 mg into the skin once a week.   telmisartan (MICARDIS) 80 MG tablet TAKE 1 TABLET BY MOUTH  DAILY   No facility-administered encounter medications on file as of 02/20/2020.    Current Diagnosis: Patient Active Problem List   Diagnosis Date Noted   Class 3 severe obesity due to excess calories with  serious comorbidity and body mass index (BMI) of 40.0 to 44.9 in adult [facility]) 03/18/2019   Essential hypertension, malignant 11/13/2017   Gastroesophageal reflux disease without esophagitis 11/13/2017   Hypertension 09/04/2017   Type II diabetes mellitus, uncontrolled ([place]) 09/04/2017   Recent Relevant Labs: Lab Results  Component Value Date/Time   HGBA1C 7.8 (H) 12/03/2019 09:58 AM   HGBA1C 7.2 (H) 08/27/2019 09:04 AM   HGBA1C 7.3 09/04/2017 12:00 AM   MICROALBUR 30 03/18/2019 10:22 AM   MICROALBUR 30 02/28/2018 10:07 AM    Kidney Function Lab Results  Component Value Date/Time   CREATININE 0.71 12/03/2019 09:58 AM   CREATININE 0.82 08/27/2019 09:04 AM   GFRNONAA 88 12/03/2019 09:58 AM   GFRAA 102 12/03/2019 09:58 AM    Current antihyperglycemic regimen:  o Ozempic 1 mg weekly o   What recent interventions/DTPs have been made to improve glycemic control:  o Patient states she has been taking her medication as directed. o    Have there been any recent hospitalizations or ED visits since last visit with CPP? No   Patient denies hypoglycemic symptoms, including Pale, Sweaty, Shaky, Hungry, Nervous/irritable and Vision changes  Patient denies hyperglycemic symptoms, including blurry vision, excessive thirst, fatigue, polyuria and weakness   How often are you checking your blood sugar? Goal - Twice a day   What are your blood  sugars ranging?  o Fasting: 02/23/2020 - 99 o Before meals: 02/23/2020 - 135 o After meals: None o Bedtime: None  During the week, how often does your blood glucose drop below 70? No    Are you checking your feet daily/regularly? Patient checks feet. No open sores , no pain, Patient states she does have some tingling and numbness in feet x 2 months.  Adherence Review: Is the patient currently on a STATIN medication? Yes Is the patient currently on ACE/ARB medication? Yes Does the patient have >5 day gap between last estimated fill dates?  No  Reviewed chart prior to disease state call. Spoke with patient regarding BP  Recent Office Vitals: BP Readings from Last 3 Encounters:  12/03/19 110/76  08/27/19 126/74  07/10/19 128/64   Pulse Readings from Last 3 Encounters:  12/03/19 83  08/27/19 88  07/10/19 90    Wt Readings from Last 3 Encounters:  12/03/19 248 lb (112.5 kg)  08/27/19 246 lb 9.6 oz (111.9 kg)  07/10/19 244 lb 12.8 oz (111 kg)     Kidney Function Lab Results  Component Value Date/Time   CREATININE 0.71 12/03/2019 09:58 AM   CREATININE 0.82 08/27/2019 09:04 AM   GFRNONAA 88 12/03/2019 09:58 AM   GFRAA 102 12/03/2019 09:58 AM    BMP Latest Ref Rng & Units 12/03/2019 08/27/2019 07/10/2019  Glucose 65 - 99 mg/dL 107(H) 127(H) 116(H)  BUN 8 - 27 mg/dL 9 12 11   Creatinine 0.57 - 1.00 mg/dL 0.71 0.82 0.75  BUN/Creat Ratio 12 - 28 13 15 15   Sodium 134 - 144 mmol/L 139 137 140  Potassium 3.5 - 5.2 mmol/L 4.2 4.2 4.4  Chloride 96 - 106 mmol/L 102 99 102  CO2 20 - 29 mmol/L 20 25 22   Calcium 8.7 - 10.3 mg/dL 9.6 9.9 9.8    Current antihypertensive regimen:  o Hydrochlorothiazide 25 mg daily o Telmisartan 80 mg daily o   How often are you checking your Blood Pressure? Goal - Check BP weekly/ Patient states she takes bi weekly    Current home BP readings: 136/72- 133/70   What recent interventions/DTPs have been made by any provider to improve Blood Pressure control since last CPP Visit: Patient states she has been taking her medication as directed.     Any recent hospitalizations or ED visits since last visit with CPP?    What diet changes have been made to improve Blood Pressure Control?  o Goal - Ensure daily salt intake < 2300 mg/day o Patient states she has been watching her salt .   What exercise is being done to improve your Blood Pressure Control?  o Goal -Increase exercise to 30 minutes 5 times week / Drink 64 ounces of water daily o Patient states she has been exercising and  drinking water .  Adherence Review: Is the patient currently on ACE/ARB medication? Yes Does the patient have >5 day gap between last estimated fill dates? No   Comprehensive medication review performed; Spoke to patient regarding cholesterol  Lipid Panel    Component Value Date/Time   CHOL 161 03/18/2019 1009   TRIG 78 03/18/2019 1009   HDL 68 03/18/2019 1009   LDLCALC 78 03/18/2019 1009    10-year ASCVD risk score: The 10-year ASCVD risk score Mikey Bussing DC Jr., et al., 2013) is: 24.2%   Values used to calculate the score:     Age: 70 years     Sex: Female     Is Non-Hispanic  African American: Yes     Diabetic: Yes     Tobacco smoker: No     Systolic Blood Pressure: 789 mmHg     Is BP treated: Yes     HDL Cholesterol: 68 mg/dL     Total Cholesterol: 161 mg/dL   Current antihyperlipidemic regimen:  o Pravastatin 40 mg 1/2 tablet daily o  o   Previous antihyperlipidemic medications tried: None   ASCVD risk enhancing conditions: age >27, DM and HTN    What recent interventions/DTPs have been made by any provider to improve Cholesterol control since last CPP Visit: Patient states she has been taking her medication as directed.    Any recent hospitalizations or ED visits since last visit with CPP? No    What diet changes have been made to improve Cholesterol?  o Patient states she does not eat red meats often. Patient eats fish and chicken.   o   What exercise is being done to improve Cholesterol?     Goal - Increase exercise to 30 minutes 5 times weekly Patient states she has been more active and has been exercising more in the last two weeks.    Adherence Review: Does the patient have >5 day gap between last estimated fill dates? No    Goals Addressed            This Visit's Progress    Pharmacy Care Plan   On track    CARE PLAN ENTRY (see longitudinal plan of care for additional care plan information)  Current Barriers:   Chronic Disease  Management support, education, and care coordination needs related to Hypertension, Hyperlipidemia, Diabetes, and Hypothyroidism   Hypertension BP Readings from Last 3 Encounters:  08/27/19 126/74  07/10/19 128/64  07/10/19 128/64    Pharmacist Clinical Goal(s): o Over the next 180 days, patient will work with PharmD and providers to maintain BP goal <130/80  Current regimen:  o Hydrochlorothiazide 25mg  daily o Telmisartan 80mg  daily  Interventions: o Provided dietary and exercise recommendations o Discussed what symptoms to watch for as a sign of low blood pressure (dizziness, lightheadedness, fainting, etc) o Discussed appropriate goal for blood pressure (less than 130/80)  Patient self care activities - Over the next 180 days, patient will: o Check BP weekly, document, and provide at future appointments o Ensure daily salt intake < 2300 mg/day o lyIncrease exercise to 30 minutes 5 times week (150 minutes per week) o Try Tylenol for pain instead of meloxicam  Hyperlipidemia Lab Results  Component Value Date/Time   LDLCALC 78 03/18/2019 10:09 AM    Pharmacist Clinical Goal(s): o Over the next 180 days, patient will work with PharmD and providers to achieve LDL goal < 70  Current regimen:  o Pravastatin 40mg  1/2 tablet daily  Interventions: o Provided dietary and exercise recommendations o Suggested patient keep pravastatin on nightstand to help prevent missed doses o Monitor future lipid panel to determine if pravastatin dose may need to be increased  Patient self care activities - Over the next 90 days, patient will: o Keep pravastatin by bed to help you remember to take it  o Take pravastatin daily as directed o Increase exercise to 30 minutes 5 times weekly  Diabetes Lab Results  Component Value Date/Time   HGBA1C 7.2 (H) 08/27/2019 09:04 AM   HGBA1C 7.0 (H) 07/10/2019 10:42 AM   HGBA1C 7.3 09/04/2017 12:00 AM    Pharmacist Clinical Goal(s): o Over the  next 90 days, patient will work  with PharmD and providers to achieve A1c goal <7%  Current regimen:  o Ozempic 1mg  weekly  Interventions: o Ozempic patient assistance was approved and delivered o Provided dietary and exercise recommendations o Discussed appropriate goals for fasting blood sugar (80-130) and 2 hours after eating (less than 180) o Congratulated patient on increased exercise  Patient self care activities - Over the next 90 days, patient will: o Check blood sugar twice daily, document, and provide at future appointments o Contact provider with any episodes of hypoglycemia o Drink 64 ounces of water daily o Increase exercise to 30 minutes 5 times weekly (150 minutes per week)  Hypothyroidism  Pharmacist Clinical Goal(s) o Over the next 180 days, patient will work with PharmD and providers to keep thyroid hormones within normal limits  Current regimen:  o Levothyroxine 47mcg daily  Interventions: o Discussed appropriate administration of levothyroxine  Patient self care activities - Over the next 180 days, patient will: o Continue levothyroxine daily as directed  Medication management  Pharmacist Clinical Goal(s): o Over the next 90 days, patient will work with PharmD and providers to maintain optimal medication adherence  Current pharmacy: [facility]  Interventions o Comprehensive medication review performed. o Continue current medication management strategy  Patient self care activities - Over the next 90 days, patient will: o Focus on medication adherence by using pill box and keeping evening medications by bed to minimize forgotten doses o Take medications as prescribed o Report any questions or concerns to PharmD and/or provider(s)  Please see past updates related to this goal by clicking on the "Past Updates" button in the selected goal         Follow-Up:  Pharmacist Review   Orlando Penner, CPP. Notified  Judithann Sheen, [place]  Pharmacist Assistant [phone]

## 2020-02-23 ENCOUNTER — Telehealth: Payer: Medicare Other

## 2020-03-04 ENCOUNTER — Telehealth: Payer: Self-pay

## 2020-03-04 NOTE — Chronic Care Management (AMB) (Signed)
° ° °  Chronic Care Management Pharmacy Assistant   Name: Michelle Hicks  MRN: [id number] DOB: April 06, 1950  Reason for Encounter: Patient Assistance Coordination   PCP : Glendale Chard, MD  Allergies:   Allergies  Allergen Reactions   Sulfa Antibiotics     Medications: Outpatient Encounter Medications as of 03/04/2020  Medication Sig   aspirin EC 81 MG tablet Take 81 mg by mouth daily. Some times   Cholecalciferol (VITAMIN D-3 PO) Take 2,000 Units by mouth daily.    docusate sodium (DULCOLAX) 100 MG capsule Take 200 mg by mouth daily as needed.    hydrochlorothiazide (HYDRODIURIL) 25 MG tablet TAKE 1 TABLET BY MOUTH  DAILY   Lancets Misc. (ONE TOUCH SURESOFT) MISC Use as directed to check blood sugars 2 times per day dx e11.65   levothyroxine (SYNTHROID) 50 MCG tablet Take 50 mcg by mouth daily.    meloxicam (MOBIC) 15 MG tablet TAKE 1 TABLET BY MOUTH  DAILY AS NEEDED   Multiple Vitamin (MULTIVITAMIN) tablet Take 1 tablet by mouth daily.   omeprazole (PRILOSEC) 40 MG capsule TAKE 1 CAPSULE BY MOUTH  DAILY BEFORE A MEAL   ONETOUCH ULTRA test strip USE AS DIRECTED TO CHECK  BLOOD SUGARS TWICE DAILY   pravastatin (PRAVACHOL) 40 MG tablet Take 1 tablet (40 mg total) by mouth daily. (Patient taking differently: Take 40 mg by mouth daily. Patient takes 1/2 tablet (20 mg))   Probiotic Product (PROBIOTIC DAILY PO) Take by mouth.    Semaglutide, 1 MG/DOSE, (OZEMPIC, 1 MG/DOSE,) 2 MG/1.5ML SOPN Inject 1 mg into the skin once a week.   telmisartan (MICARDIS) 80 MG tablet TAKE 1 TABLET BY MOUTH  DAILY   No facility-administered encounter medications on file as of 03/04/2020.    Current Diagnosis: Patient Active Problem List   Diagnosis Date Noted   Class 3 severe obesity due to excess calories with serious comorbidity and body mass index (BMI) of 40.0 to 44.9 in adult [facility]) 03/18/2019   Essential hypertension, malignant 11/13/2017   Gastroesophageal reflux disease without  esophagitis 11/13/2017   Hypertension 09/04/2017   Type II diabetes mellitus, uncontrolled ([place]) 09/04/2017     Follow-Up:  Patient Assistance Coordination-Called and made the patient aware of her Novo Nordisk forms being incomplete missing a signature and social security number from her; asked if she can come into the office in the morning to have this done as requested. Patient verbalized understanding. Patient stated she will come into the office next week to have this completed.   Notified Orlando Penner, CPP.  Raynelle Highland, [place] Pharmacist Assistant [phone]

## 2020-03-08 ENCOUNTER — Telehealth: Payer: Self-pay

## 2020-03-08 NOTE — Chronic Care Management (AMB) (Signed)
° ° °  Chronic Care Management Pharmacy Assistant   Name: Michelle Hicks  MRN: [id number] DOB: Sep 04, 1950  Reason for Encounter: Medication Review   PCP : Glendale Chard, MD  Allergies:   Allergies  Allergen Reactions   Sulfa Antibiotics     Medications: Outpatient Encounter Medications as of 03/08/2020  Medication Sig   aspirin EC 81 MG tablet Take 81 mg by mouth daily. Some times   Cholecalciferol (VITAMIN D-3 PO) Take 2,000 Units by mouth daily.    docusate sodium (DULCOLAX) 100 MG capsule Take 200 mg by mouth daily as needed.    hydrochlorothiazide (HYDRODIURIL) 25 MG tablet TAKE 1 TABLET BY MOUTH  DAILY   Lancets Misc. (ONE TOUCH SURESOFT) MISC Use as directed to check blood sugars 2 times per day dx e11.65   levothyroxine (SYNTHROID) 50 MCG tablet Take 50 mcg by mouth daily.    meloxicam (MOBIC) 15 MG tablet TAKE 1 TABLET BY MOUTH  DAILY AS NEEDED   Multiple Vitamin (MULTIVITAMIN) tablet Take 1 tablet by mouth daily.   omeprazole (PRILOSEC) 40 MG capsule TAKE 1 CAPSULE BY MOUTH  DAILY BEFORE A MEAL   ONETOUCH ULTRA test strip USE AS DIRECTED TO CHECK  BLOOD SUGARS TWICE DAILY   pravastatin (PRAVACHOL) 40 MG tablet Take 1 tablet (40 mg total) by mouth daily. (Patient taking differently: Take 40 mg by mouth daily. Patient takes 1/2 tablet (20 mg))   Probiotic Product (PROBIOTIC DAILY PO) Take by mouth.    Semaglutide, 1 MG/DOSE, (OZEMPIC, 1 MG/DOSE,) 2 MG/1.5ML SOPN Inject 1 mg into the skin once a week.   telmisartan (MICARDIS) 80 MG tablet TAKE 1 TABLET BY MOUTH  DAILY   No facility-administered encounter medications on file as of 03/08/2020.    Current Diagnosis: Patient Active Problem List   Diagnosis Date Noted   Class 3 severe obesity due to excess calories with serious comorbidity and body mass index (BMI) of 40.0 to 44.9 in adult [facility]) 03/18/2019   Essential hypertension, malignant 11/13/2017   Gastroesophageal reflux disease without esophagitis  11/13/2017   Hypertension 09/04/2017   Type II diabetes mellitus, uncontrolled ([place]) 09/04/2017      Follow-Up:  Pharmacist Review -  Reviewed chart and adherence measures. Per Insurance data medication adherence for hypertension is 100% med compliance.   Vallie Pearson,CPP Notified  Judithann Sheen, [facility] Clinical Pharmacist Assistant [phone]

## 2020-03-23 ENCOUNTER — Other Ambulatory Visit: Payer: Self-pay | Admitting: Internal Medicine

## 2020-03-24 ENCOUNTER — Other Ambulatory Visit: Payer: Self-pay

## 2020-03-24 ENCOUNTER — Ambulatory Visit (INDEPENDENT_AMBULATORY_CARE_PROVIDER_SITE_OTHER): Payer: Medicare Other

## 2020-03-24 ENCOUNTER — Ambulatory Visit (INDEPENDENT_AMBULATORY_CARE_PROVIDER_SITE_OTHER): Payer: Medicare Other | Admitting: Internal Medicine

## 2020-03-24 ENCOUNTER — Encounter: Payer: Self-pay | Admitting: Internal Medicine

## 2020-03-24 VITALS — BP 124/66 | HR 95 | Temp 98.3°F | Ht 63.4 in | Wt 245.0 lb

## 2020-03-24 VITALS — BP 124/66 | HR 95 | Temp 98.3°F | Ht 63.4 in | Wt 245.6 lb

## 2020-03-24 DIAGNOSIS — E039 Hypothyroidism, unspecified: Secondary | ICD-10-CM | POA: Diagnosis not present

## 2020-03-24 DIAGNOSIS — K5909 Other constipation: Secondary | ICD-10-CM | POA: Diagnosis not present

## 2020-03-24 DIAGNOSIS — E1165 Type 2 diabetes mellitus with hyperglycemia: Secondary | ICD-10-CM

## 2020-03-24 DIAGNOSIS — R0683 Snoring: Secondary | ICD-10-CM

## 2020-03-24 DIAGNOSIS — I10 Essential (primary) hypertension: Secondary | ICD-10-CM | POA: Diagnosis not present

## 2020-03-24 DIAGNOSIS — Z6841 Body Mass Index (BMI) 40.0 and over, adult: Secondary | ICD-10-CM

## 2020-03-24 DIAGNOSIS — Z0000 Encounter for general adult medical examination without abnormal findings: Secondary | ICD-10-CM | POA: Diagnosis not present

## 2020-03-24 LAB — POCT URINALYSIS DIPSTICK
Bilirubin, UA: NEGATIVE
Blood, UA: NEGATIVE
Glucose, UA: NEGATIVE
Ketones, UA: NEGATIVE
Nitrite, UA: NEGATIVE
Protein, UA: NEGATIVE
Spec Grav, UA: >=1.03 — AB (ref 1.010–1.025)
Urobilinogen, UA: 0.2 E.U./dL
pH, UA: 5.5 (ref 5.0–8.0)

## 2020-03-24 LAB — POCT UA - MICROALBUMIN
Albumin/Creatinine Ratio, Urine, POC: <30
Creatinine, POC: 300 mg/dL
Microalbumin Ur, POC: 30 mg/L

## 2020-03-24 NOTE — Progress Notes (Signed)
I,Katawbba Wiggins,acting as a Education administrator for [name], MD.,have documented all relevant documentation on the behalf of [name], MD,as directed by  [name], MD while in the presence of [name], MD.  This visit occurred during the SARS-CoV-2 public health emergency.  Safety protocols were in place, including screening questions prior to the visit, additional usage of staff PPE, and extensive cleaning of exam room while observing appropriate contact time as indicated for disinfecting solutions.  Subjective:     Patient ID: Michelle Hicks , female    DOB: 1950-12-12 , 70 y.o.   MRN: [id number]   Chief Complaint  Patient presents with  . Diabetes  . Hypertension    HPI  The patient is here today for a follow-up on her diabetes and blood pressure.  She reports compliance with meds. She adds she has been intentional about getting more exercise. Her goal is to reach 7500 steps per day. She denies headaches, chest pain and shortness of breath. She is also scheduled for AWV with [facility] today.   Diabetes She presents for her follow-up diabetic visit. She has type 2 diabetes mellitus. Her disease course has been stable. There are no hypoglycemic associated symptoms. There are no diabetic associated symptoms. Pertinent negatives for diabetes include no blurred vision and no chest pain. There are no hypoglycemic complications. Symptoms are stable. Risk factors for coronary artery disease include diabetes mellitus, obesity, sedentary lifestyle and post-menopausal. She is compliant with treatment most of the time. She is following a generally healthy diet. She participates in exercise intermittently. An ACE inhibitor/angiotensin II receptor blocker is being taken. Eye exam is current.  Hypertension This is a chronic problem. The current episode started more than 1 year ago. The problem has been gradually improving since onset. The problem is controlled. Associated symptoms  include palpitations. Pertinent negatives include no blurred vision, chest pain or shortness of breath. The current treatment provides moderate improvement.     Past Medical History:  Diagnosis Date  . Depression   . DM (diabetes mellitus) ([place])   . HTN (hypertension)      Family History  Problem Relation Age of Onset  . Diabetes Mother   . Pancreatic cancer Mother   . Hypertension Father   . Heart attack Father      Current Outpatient Medications:  .  aspirin EC 81 MG tablet, Take 81 mg by mouth daily. Some times, Disp: , Rfl:  .  Cholecalciferol (VITAMIN D-3 PO), Take 2,000 Units by mouth daily. , Disp: , Rfl:  .  docusate sodium (COLACE) 100 MG capsule, Take 200 mg by mouth daily as needed. , Disp: , Rfl:  .  hydrochlorothiazide (HYDRODIURIL) 25 MG tablet, TAKE 1 TABLET BY MOUTH  DAILY, Disp: 90 tablet, Rfl: 3 .  Lancets Misc. (ONE TOUCH SURESOFT) MISC, Use as directed to check blood sugars 2 times per day dx e11.65, Disp: 300 each, Rfl: 2 .  levothyroxine (SYNTHROID) 50 MCG tablet, Take 50 mcg by mouth daily. , Disp: , Rfl:  .  meloxicam (MOBIC) 15 MG tablet, TAKE 1 TABLET BY MOUTH  DAILY AS NEEDED, Disp: 90 tablet, Rfl: 3 .  Multiple Vitamin (MULTIVITAMIN) tablet, Take 1 tablet by mouth daily., Disp: , Rfl:  .  omeprazole (PRILOSEC) 40 MG capsule, TAKE 1 CAPSULE BY MOUTH  DAILY BEFORE A MEAL, Disp: 90 capsule, Rfl: 2 .  pravastatin (PRAVACHOL) 40 MG tablet, Take 1 tablet (40 mg total) by mouth daily. (  Patient taking differently: Take 40 mg by mouth daily. Patient takes 1/2 tablet (20 mg)), Disp: 90 tablet, Rfl: 2 .  Probiotic Product (PROBIOTIC DAILY PO), Take by mouth. , Disp: , Rfl:  .  Semaglutide, 1 MG/DOSE, (OZEMPIC, 1 MG/DOSE,) 2 MG/1.5ML SOPN, Inject 1 mg into the skin once a week., Disp: 6 pen, Rfl: 1 .  telmisartan (MICARDIS) 80 MG tablet, TAKE 1 TABLET BY MOUTH  DAILY, Disp: 90 tablet, Rfl: 3 .  ONETOUCH ULTRA test strip, USE AS DIRECTED TO CHECK  BLOOD SUGAR TWICE  DAILY, Disp: 200 strip, Rfl: 3   Allergies  Allergen Reactions  . Sulfa Antibiotics      Review of Systems  Constitutional: Negative.   Eyes: Negative for blurred vision.  Respiratory: Negative.  Negative for shortness of breath.   Cardiovascular: Positive for palpitations. Negative for chest pain.       She reports having an episode of palpitations with HR 152/162 in September 2020. She has not any further episodes since then. She is bringing it up b/c she has [place] app and she was recently reviewing her profile. She denies cp, sob, palpitations recently. She thought she should bring it up today b/c she has neglected to bring it up in the past.   Gastrointestinal: Positive for constipation.  Psychiatric/Behavioral: Negative.   All other systems reviewed and are negative.    Today's Vitals   03/24/20 0924  BP: 124/66  Pulse: 95  Temp: 98.3 F (36.8 C)  TempSrc: Oral  Weight: 245 lb 9.6 oz (111.4 kg)  Height: 5' 3.4" (1.61 m)  PainSc: 0-No pain   Body mass index is 42.96 kg/m.  Wt Readings from Last 3 Encounters:  03/24/20 245 lb (111.1 kg)  03/24/20 245 lb 9.6 oz (111.4 kg)  12/03/19 248 lb (112.5 kg)   Objective:  Physical Exam Vitals and nursing note reviewed.  Constitutional:      Appearance: Normal appearance. She is obese.  HENT:     Head: Normocephalic and atraumatic.  Cardiovascular:     Rate and Rhythm: Normal rate and regular rhythm.     Heart sounds: Normal heart sounds.  Pulmonary:     Effort: Pulmonary effort is normal.     Breath sounds: Normal breath sounds.  Skin:    General: Skin is warm.  Neurological:     General: No focal deficit present.     Mental Status: She is alert and oriented to person, place, and time.       Assessment And Plan:     1. Uncontrolled type 2 diabetes mellitus with hyperglycemia (HCC) Comments: Chronic, I will check labs as listed below. She was congratulated on her exercise efforts and encouraged to keep up the great  work. I will request her most recent eye exams.  - BMP8+EGFR - Hemoglobin A1c  2. Essential hypertension, benign Comments: Chronic, well controlled. She is encouraged to follow a low sodium diet.   3. Primary hypothyroidism Comments: I will check TSH and adjust meds as needed. I will be sure to forward it to Dr. Chalmers Cater for her review.  - TSH  4. Snoring Comments: She also admits to morning headaches, not feeling rested, nocturia and daytime somnolence. She agrees to Neuro referral for sleep study.  - Ambulatory referral to Neurology  5. Chronic constipation Comments: She was given samples of Linzess 169m to take daily 30 minutes after taking her thyroid meds. Will increase to 2916mif this is ineffective.   6. Class  3 severe obesity due to excess calories with serious comorbidity and body mass index (BMI) of 40.0 to 44.9 in adult [facility]) Comments: BMI 42. She has lost 3 pounds since her last visit. Advised to aim for at least 150 minutes of exercise per week.   Patient was given opportunity to ask questions. Patient verbalized understanding of the plan and was able to repeat key elements of the plan. All questions were answered to their satisfaction.  [name], MD   I, [name], MD, have reviewed all documentation for this visit. The documentation on 03/24/20 for the exam, diagnosis, procedures, and orders are all accurate and complete.  THE PATIENT IS ENCOURAGED TO PRACTICE SOCIAL DISTANCING DUE TO THE COVID-19 PANDEMIC.

## 2020-03-24 NOTE — Patient Instructions (Signed)
Diabetes Mellitus and Foot Care Foot care is an important part of your health, especially when you have diabetes. Diabetes may cause you to have problems because of poor blood flow (circulation) to your feet and legs, which can cause your skin to:  Become thinner and drier.  Break more easily.  Heal more slowly.  Peel and crack. You may also have nerve damage (neuropathy) in your legs and feet, causing decreased feeling in them. This means that you may not notice minor injuries to your feet that could lead to more serious problems. Noticing and addressing any potential problems early is the best way to prevent future foot problems. How to care for your feet Foot hygiene  Wash your feet daily with warm water and mild soap. Do not use hot water. Then, pat your feet and the areas between your toes until they are completely dry. Do not soak your feet as this can dry your skin.  Trim your toenails straight across. Do not dig under them or around the cuticle. File the edges of your nails with an emery board or nail file.  Apply a moisturizing lotion or petroleum jelly to the skin on your feet and to dry, brittle toenails. Use lotion that does not contain alcohol and is unscented. Do not apply lotion between your toes.   Shoes and socks  Wear clean socks or stockings every day. Make sure they are not too tight. Do not wear knee-high stockings since they may decrease blood flow to your legs.  Wear shoes that fit properly and have enough cushioning. Always look in your shoes before you put them on to be sure there are no objects inside.  To break in new shoes, wear them for just a few hours a day. This prevents injuries on your feet. Wounds, scrapes, corns, and calluses  Check your feet daily for blisters, cuts, bruises, sores, and redness. If you cannot see the bottom of your feet, use a mirror or ask someone for help.  Do not cut corns or calluses or try to remove them with medicine.  If you  find a minor scrape, cut, or break in the skin on your feet, keep it and the skin around it clean and dry. You may clean these areas with mild soap and water. Do not clean the area with peroxide, alcohol, or iodine.  If you have a wound, scrape, corn, or callus on your foot, look at it several times a day to make sure it is healing and not infected. Check for: ? Redness, swelling, or pain. ? Fluid or blood. ? Warmth. ? Pus or a bad smell.   General tips  Do not cross your legs. This may decrease blood flow to your feet.  Do not use heating pads or hot water bottles on your feet. They may burn your skin. If you have lost feeling in your feet or legs, you may not know this is happening until it is too late.  Protect your feet from hot and cold by wearing shoes, such as at the beach or on hot pavement.  Schedule a complete foot exam at least once a year (annually) or more often if you have foot problems. Report any cuts, sores, or bruises to your health care provider immediately. Where to find more information  American Diabetes Association: www.diabetes.org  Association of Diabetes Care & Education Specialists: www.diabeteseducator.org Contact a health care provider if:  You have a medical condition that increases your risk of infection and   you have any cuts, sores, or bruises on your feet.  You have an injury that is not healing.  You have redness on your legs or feet.  You feel burning or tingling in your legs or feet.  You have pain or cramps in your legs and feet.  Your legs or feet are numb.  Your feet always feel cold.  You have pain around any toenails. Get help right away if:  You have a wound, scrape, corn, or callus on your foot and: ? You have pain, swelling, or redness that gets worse. ? You have fluid or blood coming from the wound, scrape, corn, or callus. ? Your wound, scrape, corn, or callus feels warm to the touch. ? You have pus or a bad smell coming from  the wound, scrape, corn, or callus. ? You have a fever. ? You have a red line going up your leg. Summary  Check your feet every day for blisters, cuts, bruises, sores, and redness.  Apply a moisturizing lotion or petroleum jelly to the skin on your feet and to dry, brittle toenails.  Wear shoes that fit properly and have enough cushioning.  If you have foot problems, report any cuts, sores, or bruises to your health care provider immediately.  Schedule a complete foot exam at least once a year (annually) or more often if you have foot problems. This information is not intended to replace advice given to you by your health care provider. Make sure you discuss any questions you have with your health care provider. Document Revised: 08/21/2019 Document Reviewed: 08/21/2019 Elsevier Patient Education  2021 Elsevier Inc.  

## 2020-03-24 NOTE — Addendum Note (Signed)
Addended by: Kellie Simmering on: 03/24/2020 10:28 AM   Modules accepted: Orders

## 2020-03-24 NOTE — Patient Instructions (Signed)
Michelle Hicks , Thank you for taking time to come for your Medicare Wellness Visit. I appreciate your ongoing commitment to your health goals. Please review the following plan we discussed and let me know if I can assist you in the future.   Screening recommendations/referrals: Colonoscopy: completed 12/25/2019 Mammogram: completed 07/01/2019 Bone Density: completed 08/01/2018 Recommended yearly ophthalmology/optometry visit for glaucoma screening and checkup Recommended yearly dental visit for hygiene and checkup  Vaccinations: Influenza vaccine: completed 11/07/2019, due 09/13/2020 Pneumococcal vaccine: completed 04/12/2018 Tdap vaccine: completed 02/28/2012, due 02/27/2022 Shingles vaccine: completed   Covid-19:   Advanced directives:  04/25/2019, 03/28/2019  Conditions/risks identified: Advance directive discussed with you today. Even though you declined this today please call our office should you change your mind and we can give you the proper paperwork for you to fill out.  Next appointment: Follow up in one year for your annual wellness visit none   Preventive Care 65 Years and Older, Female Preventive care refers to lifestyle choices and visits with your health care provider that can promote health and wellness. What does preventive care include?  A yearly physical exam. This is also called an annual well check.  Dental exams once or twice a year.  Routine eye exams. Ask your health care provider how often you should have your eyes checked.  Personal lifestyle choices, including:  Daily care of your teeth and gums.  Regular physical activity.  Eating a healthy diet.  Avoiding tobacco and drug use.  Limiting alcohol use.  Practicing safe sex.  Taking low-dose aspirin every day.  Taking vitamin and mineral supplements as recommended by your health care provider. What happens during an annual well check? The services and screenings done by your health care provider  during your annual well check will depend on your age, overall health, lifestyle risk factors, and family history of disease. Counseling  Your health care provider may ask you questions about your:  Alcohol use.  Tobacco use.  Drug use.  Emotional well-being.  Home and relationship well-being.  Sexual activity.  Eating habits.  History of falls.  Memory and ability to understand (cognition).  Work and work Statistician.  Reproductive health. Screening  You may have the following tests or measurements:  Height, weight, and BMI.  Blood pressure.  Lipid and cholesterol levels. These may be checked every 5 years, or more frequently if you are over 43 years old.  Skin check.  Lung cancer screening. You may have this screening every year starting at age 55 if you have a 30-pack-year history of smoking and currently smoke or have quit within the past 15 years.  Fecal occult blood test (FOBT) of the stool. You may have this test every year starting at age 54.  Flexible sigmoidoscopy or colonoscopy. You may have a sigmoidoscopy every 5 years or a colonoscopy every 10 years starting at age 69.  Hepatitis C blood test.  Hepatitis B blood test.  Sexually transmitted disease (STD) testing.  Diabetes screening. This is done by checking your blood sugar (glucose) after you have not eaten for a while (fasting). You may have this done every 1-3 years.  Bone density scan. This is done to screen for osteoporosis. You may have this done starting at age 78.  Mammogram. This may be done every 1-2 years. Talk to your health care provider about how often you should have regular mammograms. Talk with your health care provider about your test results, treatment options, and if necessary, the need for  more tests. Vaccines  Your health care provider may recommend certain vaccines, such as:  Influenza vaccine. This is recommended every year.  Tetanus, diphtheria, and acellular pertussis  (Tdap, Td) vaccine. You may need a Td booster every 10 years.  Zoster vaccine. You may need this after age 43.  Pneumococcal 13-valent conjugate (PCV13) vaccine. One dose is recommended after age 34.  Pneumococcal polysaccharide (PPSV23) vaccine. One dose is recommended after age 49. Talk to your health care provider about which screenings and vaccines you need and how often you need them. This information is not intended to replace advice given to you by your health care provider. Make sure you discuss any questions you have with your health care provider. Document Released: 02/26/2015 Document Revised: 10/20/2015 Document Reviewed: 12/01/2014 Elsevier Interactive Patient Education  2017 [employer] Prevention in the Home Falls can cause injuries. They can happen to people of all ages. There are many things you can do to make your home safe and to help prevent falls. What can I do on the outside of my home?  Regularly fix the edges of walkways and driveways and fix any cracks.  Remove anything that might make you trip as you walk through a door, such as a raised step or threshold.  Trim any bushes or trees on the path to your home.  Use bright outdoor lighting.  Clear any walking paths of anything that might make someone trip, such as rocks or tools.  Regularly check to see if handrails are loose or broken. Make sure that both sides of any steps have handrails.  Any raised decks and porches should have guardrails on the edges.  Have any leaves, snow, or ice cleared regularly.  Use sand or salt on walking paths during winter.  Clean up any spills in your garage right away. This includes oil or grease spills. What can I do in the bathroom?  Use night lights.  Install grab bars by the toilet and in the tub and shower. Do not use towel bars as grab bars.  Use non-skid mats or decals in the tub or shower.  If you need to sit down in the shower, use a plastic, non-slip  stool.  Keep the floor dry. Clean up any water that spills on the floor as soon as it happens.  Remove soap buildup in the tub or shower regularly.  Attach bath mats securely with double-sided non-slip rug tape.  Do not have throw rugs and other things on the floor that can make you trip. What can I do in the bedroom?  Use night lights.  Make sure that you have a light by your bed that is easy to reach.  Do not use any sheets or blankets that are too big for your bed. They should not hang down onto the floor.  Have a firm chair that has side arms. You can use this for support while you get dressed.  Do not have throw rugs and other things on the floor that can make you trip. What can I do in the kitchen?  Clean up any spills right away.  Avoid walking on wet floors.  Keep items that you use a lot in easy-to-reach places.  If you need to reach something above you, use a strong step stool that has a grab bar.  Keep electrical cords out of the way.  Do not use floor polish or wax that makes floors slippery. If you must use wax, use non-skid  floor wax.  Do not have throw rugs and other things on the floor that can make you trip. What can I do with my stairs?  Do not leave any items on the stairs.  Make sure that there are handrails on both sides of the stairs and use them. Fix handrails that are broken or loose. Make sure that handrails are as long as the stairways.  Check any carpeting to make sure that it is firmly attached to the stairs. Fix any carpet that is loose or worn.  Avoid having throw rugs at the top or bottom of the stairs. If you do have throw rugs, attach them to the floor with carpet tape.  Make sure that you have a light switch at the top of the stairs and the bottom of the stairs. If you do not have them, ask someone to add them for you. What else can I do to help prevent falls?  Wear shoes that:  Do not have high heels.  Have rubber bottoms.  Are  comfortable and fit you well.  Are closed at the toe. Do not wear sandals.  If you use a stepladder:  Make sure that it is fully opened. Do not climb a closed stepladder.  Make sure that both sides of the stepladder are locked into place.  Ask someone to hold it for you, if possible.  Clearly mark and make sure that you can see:  Any grab bars or handrails.  First and last steps.  Where the edge of each step is.  Use tools that help you move around (mobility aids) if they are needed. These include:  Canes.  Walkers.  Scooters.  Crutches.  Turn on the lights when you go into a dark area. Replace any light bulbs as soon as they burn out.  Set up your furniture so you have a clear path. Avoid moving your furniture around.  If any of your floors are uneven, fix them.  If there are any pets around you, be aware of where they are.  Review your medicines with your doctor. Some medicines can make you feel dizzy. This can increase your chance of falling. Ask your doctor what other things that you can do to help prevent falls. This information is not intended to replace advice given to you by your health care provider. Make sure you discuss any questions you have with your health care provider. Document Released: 11/26/2008 Document Revised: 07/08/2015 Document Reviewed: 03/06/2014 Elsevier Interactive Patient Education  2017 [employer].

## 2020-03-24 NOTE — Progress Notes (Signed)
This visit occurred during the SARS-CoV-2 public health emergency.  Safety protocols were in place, including screening questions prior to the visit, additional usage of staff PPE, and extensive cleaning of exam room while observing appropriate contact time as indicated for disinfecting solutions.  Subjective:   Michelle Hicks is a 70 y.o. female who presents for Medicare Annual (Subsequent) preventive examination.  Review of Systems     Cardiac Risk Factors include: advanced age (>37men, >98 women);diabetes mellitus;hypertension;obesity (BMI >30kg/m2)     Objective:    Today's Vitals   03/24/20 0956  BP: 124/66  Pulse: 95  Temp: 98.3 F (36.8 C)  TempSrc: Oral  Weight: 245 lb (111.1 kg)  Height: 5' 3.4" (1.61 m)   Body mass index is 42.85 kg/m.  Advanced Directives 03/24/2020 07/24/2019 07/10/2019  Does Patient Have a Medical Advance Directive? No No No  Would patient like information on creating a medical advance directive? No - Patient declined (No Data) Yes (MAU/Ambulatory/Procedural Areas - Information given)    Current Medications (verified) Outpatient Encounter Medications as of 03/24/2020  Medication Sig  . aspirin EC 81 MG tablet Take 81 mg by mouth daily. Some times  . Cholecalciferol (VITAMIN D-3 PO) Take 2,000 Units by mouth daily.   Marland Kitchen docusate sodium (COLACE) 100 MG capsule Take 200 mg by mouth daily as needed.   . hydrochlorothiazide (HYDRODIURIL) 25 MG tablet TAKE 1 TABLET BY MOUTH  DAILY  . Lancets Misc. (ONE TOUCH SURESOFT) MISC Use as directed to check blood sugars 2 times per day dx e11.65  . levothyroxine (SYNTHROID) 50 MCG tablet Take 50 mcg by mouth daily.   . meloxicam (MOBIC) 15 MG tablet TAKE 1 TABLET BY MOUTH  DAILY AS NEEDED  . Multiple Vitamin (MULTIVITAMIN) tablet Take 1 tablet by mouth daily.  Marland Kitchen omeprazole (PRILOSEC) 40 MG capsule TAKE 1 CAPSULE BY MOUTH  DAILY BEFORE A MEAL  . pravastatin (PRAVACHOL) 40 MG tablet Take 1 tablet (40 mg total) by  mouth daily. (Patient taking differently: Take 40 mg by mouth daily. Patient takes 1/2 tablet (20 mg))  . Probiotic Product (PROBIOTIC DAILY PO) Take by mouth.   . Semaglutide, 1 MG/DOSE, (OZEMPIC, 1 MG/DOSE,) 2 MG/1.5ML SOPN Inject 1 mg into the skin once a week.  . telmisartan (MICARDIS) 80 MG tablet TAKE 1 TABLET BY MOUTH  DAILY  . [DISCONTINUED] ONETOUCH ULTRA test strip USE AS DIRECTED TO CHECK  BLOOD SUGARS TWICE DAILY   No facility-administered encounter medications on file as of 03/24/2020.    Allergies (verified) Sulfa antibiotics   History: Past Medical History:  Diagnosis Date  . Depression   . DM (diabetes mellitus) ([place])   . HTN (hypertension)    Past Surgical History:  Procedure Laterality Date  . ABDOMINAL HYSTERECTOMY    . TONSILLECTOMY Bilateral 1967   Family History  Problem Relation Age of Onset  . Diabetes Mother   . Pancreatic cancer Mother   . Hypertension Father   . Heart attack Father    Social History   Socioeconomic History  . Marital status: Married    Spouse name: Not on file  . Number of children: Not on file  . Years of education: Not on file  . Highest education level: Not on file  Occupational History  . Occupation: retired  Tobacco Use  . Smoking status: Former Smoker    Packs/day: 0.50    Years: 15.00    Pack years: 7.50    Types: Cigarettes  . Smokeless  tobacco: Never Used  . Tobacco comment: 20 years ago  Vaping Use  . Vaping Use: Never used  Substance and Sexual Activity  . Alcohol use: Not Currently    Comment: socially   . Drug use: No  . Sexual activity: Yes    Partners: Male    Birth control/protection: Surgical  Other Topics Concern  . Not on file  Social History Narrative  . Not on file   Social Determinants of Health   Financial Resource Strain: Low Risk   . Difficulty of Paying Living Expenses: Not hard at all  Food Insecurity: No Food Insecurity  . Worried About Charity fundraiser in the Last Year: Never  true  . Ran Out of Food in the Last Year: Never true  Transportation Needs: No Transportation Needs  . Lack of Transportation (Medical): No  . Lack of Transportation (Non-Medical): No  Physical Activity: Insufficiently Active  . Days of Exercise per Week: 5 days  . Minutes of Exercise per Session: 20 min  Stress: No Stress Concern Present  . Feeling of Stress : Not at all  Social Connections: Not on file    Tobacco Counseling Counseling given: Not Answered Comment: 20 years ago   Clinical Intake:  Pre-visit preparation completed: Yes  Pain : No/denies pain     Nutritional Status: BMI > 30  Obese Nutritional Risks: None Diabetes: Yes  How often do you need to have someone help you when you read instructions, pamphlets, or other written materials from your doctor or pharmacy?: 1 - Never What is the last grade level you completed in school?: 16 yrs  Diabetic? Yes Nutrition Risk Assessment:  Has the patient had any N/V/D within the last 2 months?  No  Does the patient have any non-healing wounds?  No  Has the patient had any unintentional weight loss or weight gain?  No   Diabetes:  Is the patient diabetic?  Yes  If diabetic, was a CBG obtained today?  No  Did the patient bring in their glucometer from home?  No  How often do you monitor your CBG's? daily.   Financial Strains and Diabetes Management:  Are you having any financial strains with the device, your supplies or your medication? No .  Does the patient want to be seen by Chronic Care Management for management of their diabetes?  No  Would the patient like to be referred to a Nutritionist or for Diabetic Management?  No   Diabetic Exams:  Diabetic Eye Exam: Completed 01/12/2020 Diabetic Foot Exam: Completed today          Activities of Daily Living In your present state of health, do you have any difficulty performing the following activities: 03/24/2020 07/10/2019  Hearing? N N  Vision? Y Y   Comment sometimes sometimes  Difficulty concentrating or making decisions? N N  Walking or climbing stairs? N N  Dressing or bathing? N N  Doing errands, shopping? N N  Preparing Food and eating ? N N  Using the Toilet? N N  In the past six months, have you accidently leaked urine? Y N  Do you have problems with loss of bowel control? N N  Managing your Medications? N N  Managing your Finances? N N  Housekeeping or managing your Housekeeping? N N  Some recent data might be hidden    Patient Care Team: Glendale Chard, MD as PCP - General (Internal Medicine) Mayford Knife, [facility] (Pharmacist)  Indicate any recent  Medical Services you may have received from other than [facility] providers in the past year (date may be approximate).     Assessment:   This is a routine wellness examination for Braylynn.  Hearing/Vision screen  Hearing Screening   125Hz  250Hz  500Hz  1000Hz  2000Hz  3000Hz  4000Hz  6000Hz  8000Hz   Right ear:           Left ear:           Vision Screening Comments: Regular eye exams, Dr. Katy Fitch  Dietary issues and exercise activities discussed: Current Exercise Habits: Home exercise routine, Type of exercise: strength training/weights, Time (Minutes): 20, Frequency (Times/Week): 5, Weekly Exercise (Minutes/Week): 100  Goals    .  "to keep my blood pressure under good control" (pt-stated)      CARE PLAN ENTRY (see longitudinal plan of care for additional care plan information)  Current Barriers:  Marland Kitchen Knowledge Deficits related to disease process and Self Health management of Hypertension . Chronic Disease Management support and education needs related to Hypertension and DMII  Nurse Case Manager Clinical Goal(s):  Marland Kitchen Over the next 90 days, patient will work with disease education and support to address needs related to disease education and support to help improve Self Health management of Hypertension as evidence by patient will achieve/maintain target BP <130/80  CCM RN CM  Interventions:  08/08/19 call completed with patient  . Inter-disciplinary care team collaboration (see longitudinal plan of care) . Evaluation of current treatment plan related to Hypertension  and patient's adherence to plan as established by provider. . Provided education to patient re: disease process and how to achieve goal BP <130.80 . Reviewed medications with patient and discussed current antihypertensive regimen including dosage and frequency, patient is adherent w/o noted SE, denies financial hardship paying for meds . Discussed plans with patient for ongoing care management follow up and provided patient with direct contact information for care management team . Advised patient, providing education and rationale, to monitor blood pressure daily and record, calling the CCM team and PCP for findings outside established parameters.  . Provided patient with printed educational materials related to What is High Blood Pressure?; African Americans and High Blood Pressure; High Blood Pressure and Stroke; Life's Simple 7  Patient Self Care Activities:  . Self administers medications as prescribed . Attends all scheduled provider appointments . Calls pharmacy for medication refills . Performs ADL's independently . Performs IADL's independently . Calls provider office for new concerns or questions  Initial goal documentation     .  "to keep my diabetes under good control" (pt-stated)      CARE PLAN ENTRY (see longitudinal plan of care for additional care plan information)  Current Barriers:  Marland Kitchen Knowledge Deficits related to disease process and Self Health management of Diabetes Mellitus . Chronic Disease Management support and education needs related to DMII and Hypertension   Nurse Case Manager Clinical Goal(s):  Marland Kitchen Over the next 90 days, patient will work with the CCM team and PCP to address needs related to disease education and support to improve Self Health management of DM  CCM RN  CM Interventions:  08/08/19 call completed with patient  . Inter-disciplinary care team collaboration (see longitudinal plan of care) . Evaluation of current treatment plan related to Diabetes and patient's adherence to plan as established by provider. . Provided education to patient re: target A1c <7.0 %; Educated on disease process and how to achieve this goal; Educated on parameters for daily glycemic control FBS 80-130; <180  after meals; Educated on 15'15' rule  . Reviewed medication with patient and discussed indication, dosage and frequency of patient's prescribed medication o Semaglutide 1 mg/dose (Ozempic 1 mg/dose) 2 mg/1.5 ml, inject 1 mg sq once weekly  . Provided patient with printed educational materials related to Diabetes Meal Planning using the Plate Method; Diabetes Care Schedule; Grocery Shopping: Living with Diabetes; Prevent Complications with Diabetes . Advised patient, providing education and rationale, to check cbg 1-2 times daily before meals and record, calling the CCM team and PCP for findings outside established parameters  . Discussed plans with patient for ongoing care management follow up and provided patient with direct contact information for care management team  Patient Self Care Activities:  . Self administers medications as prescribed . Attends all scheduled provider appointments . Calls pharmacy for medication refills . Performs ADL's independently . Performs IADL's independently . Calls provider office for new concerns or questions  Initial goal documentation     .  Ozempic Patient Assistance      CARE PLAN ENTRY (see longitudinal plan of care for additional care plan information)  Current Barriers:  . 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 Comprehensive medication review completed; medication list updated in electronic medical record.  Bertram Savin care team collaboration (see longitudinal plan of care) . Ozempic by [employer]: Patient meets income criteria for this medication's patient assistance program. Reviewed application process. Patient will provide proof of income. Will collaborate with primary care provider Glendale Chard, MD for their portion of application. Once completed, will submit to [employer] patient assistance program. . Patient signed application today  Patient Self Care Activities:  . Patient will provide necessary portions of application   Initial goal documentation     .  Patient Stated      07/10/2019, wants to get to 235 pounds    .  Patient Stated      03/24/2020, wants to get under 200 pounds    .  Pharmacy Care Plan      CARE PLAN ENTRY (see longitudinal plan of care for additional care plan information)  Current Barriers:  . Chronic Disease Management support, education, and care coordination needs related to Hypertension, Hyperlipidemia, Diabetes, and Hypothyroidism   Hypertension BP Readings from Last 3 Encounters:  08/27/19 126/74  07/10/19 128/64  07/10/19 128/64   . Pharmacist Clinical Goal(s): o Over the next 180 days, patient will work with PharmD and providers to maintain BP goal <130/80 . Current regimen:  o Hydrochlorothiazide 25mg  daily o Telmisartan 80mg  daily . Interventions: o Provided dietary and exercise recommendations o Discussed what symptoms to watch for as a sign of low blood pressure (dizziness, lightheadedness, fainting, etc) o Discussed appropriate goal for blood pressure (less than 130/80) . Patient self care activities - Over the next 180 days, patient will: o Check BP weekly, document, and provide at future appointments o Ensure daily salt intake < 2300 mg/day o lyIncrease exercise to 30 minutes 5 times week  (150 minutes per week) o Try Tylenol for pain instead of meloxicam  Hyperlipidemia Lab Results  Component Value Date/Time   LDLCALC 78 03/18/2019 10:09 AM   . Pharmacist Clinical Goal(s): o Over the next 180 days, patient will work with PharmD and providers to achieve  LDL goal < 70 . Current regimen:  o Pravastatin 40mg  1/2 tablet daily . Interventions: o Provided dietary and exercise recommendations o Suggested patient keep pravastatin on nightstand to help prevent missed doses o Monitor future lipid panel to determine if pravastatin dose may need to be increased . Patient self care activities - Over the next 90 days, patient will: o Keep pravastatin by bed to help you remember to take it  o Take pravastatin daily as directed o Increase exercise to 30 minutes 5 times weekly  Diabetes Lab Results  Component Value Date/Time   HGBA1C 7.2 (H) 08/27/2019 09:04 AM   HGBA1C 7.0 (H) 07/10/2019 10:42 AM   HGBA1C 7.3 09/04/2017 12:00 AM   . Pharmacist Clinical Goal(s): o Over the next 90 days, patient will work with PharmD and providers to achieve A1c goal <7% . Current regimen:  o Ozempic 1mg  weekly . Interventions: o Ozempic patient assistance was approved and delivered o Provided dietary and exercise recommendations o Discussed appropriate goals for fasting blood sugar (80-130) and 2 hours after eating (less than 180) o Congratulated patient on increased exercise . Patient self care activities - Over the next 90 days, patient will: o Check blood sugar twice daily, document, and provide at future appointments o Contact provider with any episodes of hypoglycemia o Drink 64 ounces of water daily o Increase exercise to 30 minutes 5 times weekly (150 minutes per week)  Hypothyroidism . Pharmacist Clinical Goal(s) o Over the next 180 days, patient will work with PharmD and providers to keep thyroid hormones within normal limits . Current regimen:  o Levothyroxine 62mcg  daily . Interventions: o Discussed appropriate administration of levothyroxine . Patient self care activities - Over the next 180 days, patient will: o Continue levothyroxine daily as directed  Medication management . Pharmacist Clinical Goal(s): o Over the next 90 days, patient will work with PharmD and providers to maintain optimal medication adherence . Current pharmacy: [facility] . Interventions o Comprehensive medication review performed. o Continue current medication management strategy . Patient self care activities - Over the next 90 days, patient will: o Focus on medication adherence by using pill box and keeping evening medications by bed to minimize forgotten doses o Take medications as prescribed o Report any questions or concerns to PharmD and/or provider(s)  Please see past updates related to this goal by clicking on the "Past Updates" button in the selected goal        Depression Screen PHQ 2/9 Scores 03/24/2020 07/10/2019 04/15/2019 09/10/2018 06/11/2018 02/28/2018 12/06/2017  PHQ - 2 Score 0 0 0 0 0 0 0  PHQ- 9 Score - 3 - - - - -    Fall Risk Fall Risk  03/24/2020 07/10/2019 04/15/2019 09/10/2018 06/11/2018  Falls in the past year? 0 0 0 0 0  Number falls in past yr: - - 0 - -  Injury with Fall? - - 0 - -  Risk for fall due to : Medication side effect Medication side effect - - -  Follow up Falls evaluation completed;Education provided;Falls prevention discussed Falls evaluation completed;Education provided;Falls prevention discussed - - -    FALL RISK PREVENTION PERTAINING TO THE HOME:  Any stairs in or around the home? Yes  If so, are there any without handrails? No  Home free of loose throw rugs in walkways, pet beds, electrical cords, etc? Yes  Adequate lighting in your home to reduce risk of falls? Yes   ASSISTIVE DEVICES UTILIZED TO PREVENT FALLS:  Life alert? No  Use of a cane, walker or w/c? No  Grab bars in the bathroom? Yes  Shower chair or bench in  shower? No  Elevated toilet seat or a handicapped toilet? No   TIMED UP AND GO:  Was the test performed? No . .   Gait steady and fast without use of assistive device  Cognitive Function:     6CIT Screen 03/24/2020 07/10/2019  What Year? 0 points 0 points  What month? 0 points 0 points  What time? 0 points 0 points  Count back from 20 0 points 0 points  Months in reverse 0 points 0 points  Repeat phrase 4 points 0 points  Total Score 4 0    Immunizations Immunization History  Administered Date(s) Administered  . DTaP 02/28/2012  . Fluad Quad(high Dose 65+) 11/07/2019  . Influenza, High Dose Seasonal PF 11/14/2017, 10/30/2018  . Influenza-Unspecified 11/14/2017, 10/30/2018  . Moderna Sars-Covid-2 Vaccination 03/28/2019, 04/25/2019, 02/03/2020  . Pneumococcal Conjugate-13 04/12/2018  . Pneumococcal-Unspecified 08/06/2013  . Zoster Recombinat (Shingrix) 11/14/2018, 07/29/2019    TDAP status: Up to date  Flu Vaccine status: Up to date  Pneumococcal vaccine status: Up to date  Covid-19 vaccine status: Completed vaccines  Qualifies for Shingles Vaccine? Yes   Zostavax completed No   Shingrix Completed?: Yes  Screening Tests Health Maintenance  Topic Date Due  . OPHTHALMOLOGY EXAM  09/04/2019  . FOOT EXAM  03/17/2020  . PNA vac Low Risk Adult (2 of 2 - PPSV23) 07/09/2020 (Originally 04/13/2019)  . HEMOGLOBIN A1C  06/02/2020  . MAMMOGRAM  06/30/2021  . TETANUS/TDAP  02/27/2022  . COLONOSCOPY (Pts 45-29yrs Insurance coverage will need to be confirmed)  12/24/2029  . INFLUENZA VACCINE  Completed  . DEXA SCAN  Completed  . COVID-19 Vaccine  Completed  . Hepatitis C Screening  Completed    Health Maintenance  Health Maintenance Due  Topic Date Due  . OPHTHALMOLOGY EXAM  09/04/2019  . FOOT EXAM  03/17/2020    Colorectal cancer screening: Type of screening: Colonoscopy. Completed 12/25/2019. Repeat every 10 years  Mammogram status: Completed 07/01/2019. Repeat  every year  Bone Density status: Completed 08/01/2018. Results reflect: Bone density results: NORMAL. Repeat every 0 years.  Lung Cancer Screening: (Low Dose CT Chest recommended if Age 30-80 years, 30 pack-year currently smoking OR have quit w/in 15years.) does not qualify.   Lung Cancer Screening Referral: no  Additional Screening:  Hepatitis C Screening: does qualify; Completed 02/28/2012  Vision Screening: Recommended annual ophthalmology exams for early detection of glaucoma and other disorders of the eye. Is the patient up to date with their annual eye exam?  Yes  Who is the provider or what is the name of the office in which the patient attends annual eye exams? Dr. Katy Fitch If pt is not established with a provider, would they like to be referred to a provider to establish care? No .   Dental Screening: Recommended annual dental exams for proper oral hygiene  Community Resource Referral / Chronic Care Management: CRR required this visit?  No   CCM required this visit?  No      Plan:     I have personally reviewed and noted the following in the patient's chart:   . Medical and social history . Use of alcohol, tobacco or illicit drugs  . Current medications and supplements . Functional ability and status . Nutritional status . Physical activity . Advanced directives . List of other physicians . Hospitalizations,  surgeries, and ER visits in previous 12 months . Vitals . Screenings to include cognitive, depression, and falls . Referrals and appointments  In addition, I have reviewed and discussed with patient certain preventive protocols, quality metrics, and best practice recommendations. A written personalized care plan for preventive services as well as general preventive health recommendations were provided to patient.     Kellie Simmering, LPN   [id number]/0/[id number]   Nurse Notes:

## 2020-03-25 LAB — BMP8+EGFR
BUN/Creatinine Ratio: 14 (ref 12–28)
BUN: 12 mg/dL (ref 8–27)
CO2: 21 mmol/L (ref 20–29)
Calcium: 10.2 mg/dL (ref 8.7–10.3)
Chloride: 98 mmol/L (ref 96–106)
Creatinine, Ser: 0.83 mg/dL (ref 0.57–1.00)
GFR calc Af Amer: 84 mL/min/{1.73_m2} (ref >59)
GFR calc non Af Amer: 73 mL/min/{1.73_m2} (ref >59)
Glucose: 115 mg/dL — ABNORMAL HIGH (ref 65–99)
Potassium: 4.5 mmol/L (ref 3.5–5.2)
Sodium: 138 mmol/L (ref 134–144)

## 2020-03-25 LAB — HEMOGLOBIN A1C
Est. average glucose Bld gHb Est-mCnc: 157 mg/dL
Hgb A1c MFr Bld: 7.1 % — ABNORMAL HIGH (ref 4.8–5.6)

## 2020-03-25 LAB — TSH: TSH: 1.6 u[IU]/mL (ref 0.450–4.500)

## 2020-03-26 ENCOUNTER — Telehealth: Payer: Medicare Other

## 2020-04-06 ENCOUNTER — Encounter: Payer: Self-pay | Admitting: Internal Medicine

## 2020-04-12 ENCOUNTER — Encounter: Payer: Self-pay | Admitting: Internal Medicine

## 2020-04-19 ENCOUNTER — Encounter: Payer: Medicare Other | Admitting: Internal Medicine

## 2020-04-23 ENCOUNTER — Telehealth: Payer: Medicare Other

## 2020-04-26 ENCOUNTER — Ambulatory Visit: Payer: Medicare Other | Admitting: Neurology

## 2020-04-26 ENCOUNTER — Encounter: Payer: Self-pay | Admitting: Neurology

## 2020-04-26 VITALS — BP 142/80 | HR 77 | Ht 64.0 in | Wt 247.0 lb

## 2020-04-26 DIAGNOSIS — F5104 Psychophysiologic insomnia: Secondary | ICD-10-CM

## 2020-04-26 DIAGNOSIS — E1165 Type 2 diabetes mellitus with hyperglycemia: Secondary | ICD-10-CM

## 2020-04-26 DIAGNOSIS — F32A Depression, unspecified: Secondary | ICD-10-CM | POA: Diagnosis not present

## 2020-04-26 DIAGNOSIS — K219 Gastro-esophageal reflux disease without esophagitis: Secondary | ICD-10-CM | POA: Diagnosis not present

## 2020-04-26 DIAGNOSIS — E66813 Obesity, class 3: Secondary | ICD-10-CM

## 2020-04-26 DIAGNOSIS — R5383 Other fatigue: Secondary | ICD-10-CM | POA: Diagnosis not present

## 2020-04-26 DIAGNOSIS — Z6841 Body Mass Index (BMI) 40.0 and over, adult: Secondary | ICD-10-CM

## 2020-04-26 DIAGNOSIS — R0683 Snoring: Secondary | ICD-10-CM | POA: Diagnosis not present

## 2020-04-26 DIAGNOSIS — I10 Essential (primary) hypertension: Secondary | ICD-10-CM

## 2020-04-26 MED ORDER — TRAZODONE HCL 50 MG PO TABS: 50.0000 mg | ORAL_TABLET | Freq: Every evening | ORAL | 2 refills | Status: DC | PRN

## 2020-04-26 NOTE — Progress Notes (Addendum)
SLEEP MEDICINE CLINIC    Provider:  Larey Seat, MD  Primary Care Physician:  Glendale Chard, [address] [place] [address]     Referring Provider: Glendale Chard, [address] [place],  [place] [address]          Chief Complaint according to patient   Patient presents with:    . New Patient (Initial Visit)           HISTORY OF PRESENT ILLNESS:  Michelle Hicks is a 70 year -old  African American female patient and is seen here upon referral  by dDr Baird Cancer on 04/26/2020 for a sleep consultation. .  Chief concern according to patient :  Presents to address insomnia concerns. Last SS 10+ yrs ago and was negative for OSA. She states that she wakes up during the night a lot. Her husband told her she snores. She has woke up trying to catch breath but doesn't happen often. Avg 5-6 hrs of broken sleep, noy restoring.  Preston Fleeting Hughlett  has a past medical history of : Dysphagia due to GERD, hyperlipidemia, Hypothyroidsism, Depression, DM type 2 on Ozempic(diabetes mellitus) with Diabetic Neuropathy, Morbid Obesity ([place]), and HTN (hypertension).   The patient had the first sleep study in the year 2010 or circe , she barely slept and felt is may not be valid result.   Sleep relevant medical history: Nocturia 1-2, yes to Tonsillectomy; frequent Sinusitis with headaches . Family medical /sleep history: No other family member on CPAP with OSA, insomnia, sleep walkers.    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 The patient worked from 11.30 AM and up to 8 PM.  Tobacco use: quit over 20 years ago-.  ETOH use: very seldomly,  Caffeine intake in form of Coffee( /) Soda( /) Tea ( some green tea ) or energy drinks. Regular exercise in form of golfing, tennis, Volleyball - in Summer.    Sleep habits are as follows: The patient's dinner time is between 6 PM. The patient goes to  bed at 12.30AM , she listens to meditation tapes,with music, breathing exercises  and continues to sleep for intervals of 2-3 hours, wakes for unknown  reasons, it is not related  bathroom breaks.   The preferred sleep position is supine and right side- , with the support of wedge /pillows.  Dreams are reportedly rare/can't remember.  10-11 AM is the usual rise time. She is in bed a long time without sleeping.  The patient wakes up with an alarm, but she snoozes it. She sleeps with OTC sleep aid Unisom.   She reports not feeling refreshed or restored in AM, with symptoms such as dry mouth and residual fatigue. Naps are taken not frequently, she can't fall asleep. No RLS.   Review of Systems: Out of a complete 14 system review, the patient complains of only the following symptoms, and all other reviewed systems are negative.:  Fatigue, sleepiness , snoring, fragmented sleep, Insomnia - sleep initiation, delayed sleep phase syndrome inability to nap   How likely are you to doze in the following situations: 0 = not likely, 1 = slight chance, 2 = moderate chance, 3 = high chance   Sitting and Reading? Watching Television? Sitting inactive in a public place (theater or meeting)? As a passenger in a car for an hour without a break? Lying down in the afternoon  when circumstances permit? Sitting and talking to someone? Sitting quietly after lunch without alcohol? In a car, while stopped for a few minutes in traffic?   Total = 7-9/ 24 points   FSS endorsed at 27/ 63 points.   Social History   Socioeconomic History  . Marital status: Married    Spouse name: Not on file  . Number of children: Not on file  . Years of education: Not on file  . Highest education level: Not on file  Occupational History  . Occupation: retired  Tobacco Use  . Smoking status: Former Smoker    Packs/day: 0.50    Years: 15.00    Pack years: 7.50    Types: Cigarettes  . Smokeless tobacco: Never Used  . Tobacco  comment: 20 years ago  Vaping Use  . Vaping Use: Never used  Substance and Sexual Activity  . Alcohol use: Not Currently    Comment: socially   . Drug use: No  . Sexual activity: Yes    Partners: Male    Birth control/protection: Surgical  Other Topics Concern  . Not on file  Social History Narrative  . Not on file   Social Determinants of Health   Financial Resource Strain: Low Risk   . Difficulty of Paying Living Expenses: Not hard at all  Food Insecurity: No Food Insecurity  . Worried About Charity fundraiser in the Last Year: Never true  . Ran Out of Food in the Last Year: Never true  Transportation Needs: No Transportation Needs  . Lack of Transportation (Medical): No  . Lack of Transportation (Non-Medical): No  Physical Activity: Insufficiently Active  . Days of Exercise per Week: 5 days  . Minutes of Exercise per Session: 20 min  Stress: No Stress Concern Present  . Feeling of Stress : Not at all  Social Connections: Not on file    Family History  Problem Relation Age of Onset  . Diabetes Mother   . Pancreatic cancer Mother   . Hypertension Father   . Heart attack Father     Past Medical History:  Diagnosis Date  . Depression   . DM (diabetes mellitus) ([place])   . HTN (hypertension)  GERD  OBESITY NEUROPATHY, DM  chronic insomnia       Past Surgical History:  Procedure Laterality Date  . ABDOMINAL HYSTERECTOMY    . TONSILLECTOMY Bilateral 1967     Current Outpatient Medications on File Prior to Visit  Medication Sig Dispense Refill  . aspirin EC 81 MG tablet Take 81 mg by mouth daily. Some times    . Cholecalciferol (VITAMIN D-3 PO) Take 2,000 Units by mouth daily.     Marland Kitchen docusate sodium (COLACE) 100 MG capsule Take 200 mg by mouth daily as needed.     . hydrochlorothiazide (HYDRODIURIL) 25 MG tablet TAKE 1 TABLET BY MOUTH  DAILY 90 tablet 3  . Lancets Misc. (ONE TOUCH SURESOFT) MISC Use as directed to check blood sugars 2 times per day dx  e11.65 300 each 2  . levothyroxine (SYNTHROID) 50 MCG tablet Take 50 mcg by mouth daily.     . meloxicam (MOBIC) 15 MG tablet TAKE 1 TABLET BY MOUTH  DAILY AS NEEDED 90 tablet 3  . Multiple Vitamin (MULTIVITAMIN) tablet Take 1 tablet by mouth daily.    Marland Kitchen omeprazole (PRILOSEC) 40 MG capsule TAKE 1 CAPSULE BY MOUTH  DAILY BEFORE A MEAL 90 capsule 2  . ONETOUCH ULTRA test strip USE AS DIRECTED  TO CHECK  BLOOD SUGAR TWICE DAILY 200 strip 3  . pravastatin (PRAVACHOL) 40 MG tablet Take 1 tablet (40 mg total) by mouth daily. (Patient taking differently: Take 40 mg by mouth daily. Patient takes 1/2 tablet (20 mg)) 90 tablet 2  . Probiotic Product (PROBIOTIC DAILY PO) Take by mouth.     . Semaglutide, 1 MG/DOSE, (OZEMPIC, 1 MG/DOSE,) 2 MG/1.5ML SOPN Inject 1 mg into the skin once a week. 6 pen 1  . telmisartan (MICARDIS) 80 MG tablet TAKE 1 TABLET BY MOUTH  DAILY 90 tablet 3   No current facility-administered medications on file prior to visit.    Allergies  Allergen Reactions  . Sulfa Antibiotics     Physical exam:  Today's Vitals   04/26/20 1255  BP: (!) 142/80  Pulse: 77  Weight: 247 lb (112 kg)  Height: 5\' 4"  (1.626 m)   Body mass index is 42.4 kg/m.   Wt Readings from Last 3 Encounters:  04/26/20 247 lb (112 kg)  03/24/20 245 lb (111.1 kg)  03/24/20 245 lb 9.6 oz (111.4 kg)     Ht Readings from Last 3 Encounters:  04/26/20 5\' 4"  (1.626 m)  03/24/20 5' 3.4" (1.61 m)  03/24/20 5' 3.4" (1.61 m)      General: The patient is awake, alert and appears not in acute distress. The patient is well groomed. Head: Normocephalic, atraumatic. Neck is supple. Mallampati 3 plus, scalloped tongue, prognathia.   neck circumference:16. 5 inches . Nasal airflow patent.   Dental status: crowded Cardiovascular:  Regular rate and cardiac rhythm by pulse,  without distended neck veins. Respiratory: Lungs are clear to auscultation.  Skin:  Without evidence of ankle edema, or rash. Trunk: The  patient's posture is erect.   Neurologic exam : The patient is awake and alert, oriented to place and time.   Memory subjective described as intact.  Attention span & concentration ability appears normal.  Speech is fluent,  without  dysarthria, dysphonia or aphasia.  Mood and affect are appropriate.   Cranial nerves: no loss of smell or taste reported  Pupils are equal and briskly reactive to light. Funduscopic exam deferred. .  Extraocular movements in vertical and horizontal planes were intact and without nystagmus. No Diplopia. Visual fields by finger perimetry are intact. Hearing was intact to soft voice and finger rubbing.    Facial sensation intact to fine touch.  Facial motor strength is symmetric and tongue and uvula move midline.  Neck ROM : rotation, tilt and flexion extension were normal for age and shoulder shrug was symmetrical.    Motor exam:  Symmetric bulk, tone and ROM.   Normal tone without cog- wheeling, symmetric grip strength .   Sensory:  Fine touch and vibration were  normal.  Proprioception tested in the upper extremities was normal.   Coordination: Rapid alternating movements in the fingers/hands were of normal speed.  The Finger-to-nose maneuver was intact without evidence of ataxia, dysmetria or tremor.   Gait and station: Patient could rise unassisted from a seated position, walked without assistive device.  Stance is of normal width/ base and the patient turned with 4 steps. SHE FEELS SOMETIMES OFF BALANCE. Toe and heel walk were deferred.   Deep tendon reflexes: in the  upper and lower extremities are symmetrically attenuated - no ACHILLES TENDON REFLEX .  Babinski response was deferred .       After spending a total time of 35 minutes face to face and additional time  for physical and neurologic examination, review of laboratory studies,  personal review of imaging studies, reports and results of other testing and review of referral information /  records as far as provided in visit, I have established the following assessments:  Mrs. Abts had presented on 24 March 2020 to Dr. Baird Cancer office she is followed up there on diabetes and blood pressure issues.  She has a good history of compliance with medication.  She is working on getting more exercise.  She had a recent eye exam.  She is a chronic problem with hypertension and the last current episode started more than a year ago.  She does not have blurred vision chest pain or shortness of breath according to these notes.  Review of medication shows that she is taking hydrochlorothiazide as a diuretic, she is on vitamin D Colace, baby aspirin, Synthroid, Mobic, Prilosec, Pravachol, probiotics, Ozempic, and my card is.  She is checking her blood sugar with One Touch ultra strips.  Dr. Michiel Sites has treated her for hypothyroidism.  She has admitted to morning headaches according to Dr. Baird Cancer notes and to snoring she does not feel rested here nocturia and daytime somnolence are also mentioned also they were not reflected in her Epworth sleepiness score or review of systems.  Body mass index exceeds 40 at this time.    I think that Mrs. Heider has certainly a risk factor for obstructive sleep apnea she has a high-grade Mallampati she has a scalloped tongue and rather crowded lower jaw dentition.  She does not have retrognathia rather prognathia.  Snoring and BMI are certainly additional risk factors as all the underlying conditions that can be signs of obstructive sleep apnea including hypertension and some metabolic diseases.   Her insomnia however cannot be just related to apnea the has to be another factor and possibly a nonorganic factor.  She has trouble falling asleep which is not apnea related and she does not have restless legs.  She states she has sometime morning headaches.  I would like for her to undergo a home sleep test if possible to screen for sleep apnea and from there we can see if  there is an organic component.  As to chronic insomnia for other reasons such as anxiety, hypervigilance, history of PTSD or depression it is usually necessary to treat the underlying condition and then improve the insomnia based on this.  She has made arrangements such as listening to meditation tapes and coming down preparing herself for bed but unfortunately she stays in bed for too long to have additionally gained efficiency of sleep.  So if bedtime is midnight I would very much like for her to rise at 8 and not to spend extra hours in bed.   She should avoid napping in daytime but seems to not be a problem for her as she has trouble initiating sleep even for a nap.  The goal is to move her sleep time back into the night instead of developing slowly a delayed sleep phase syndrome.  She has been taking over-the-counter Unisom to help her with sleep and she feels better when she had more hours of sleep due to the medication.  Unisom is not addictive but it can lead to more dry mouth dry eyes and urinary retention also as our patients get older they are more likely to have some confusional episodes.  Unisom however is not as anticholinergic as Benadryl would be.    My Plan is to proceed with:  1) HST or PSG  2) Insomnia is certainly related to anxiety/ depression/ when it cames to sleep onset. no caffeine after 3 PM.  3) I ordered  trazodone 50 mg tab, 1/2 tab prn at bedtime.   I would like to thank Glendale Chard, MD and Glendale Chard, [address] [place],  [place] [address] for allowing me to meet with and to take care of this pleasant patient.   In short, QUANIYA DAMAS is presenting with chronic insomnia , snoring and inability to maintain sleep through the night.  I plan to follow up either personally or through our NP within 3 month.     Electronically signed by: Larey Seat, MD 04/26/2020 1:11 PM  [facility] [place] [facility] certified by  The [employer] of Sleep Medicine and Diplomate of the [employer] of Sleep Medicine. Board certified In Neurology through the [place], Fellow of the [employer] of Neurology. Medical Director of [employer].

## 2020-04-26 NOTE — Patient Instructions (Signed)
Quality Sleep Information, Adult Quality sleep is important for your mental and physical health. It also improves your quality of life. Quality sleep means you:  Are asleep for most of the time you are in bed.  Fall asleep within 30 minutes.  Wake up no more than once a night.  Are awake for no longer than 20 minutes if you do wake up during the night. Most adults need 7-8 hours of quality sleep each night. How can poor sleep affect me? If you do not get enough quality sleep, you may have:  Mood swings.  Daytime sleepiness.  Confusion.  Decreased reaction time.  Sleep disorders, such as insomnia and sleep apnea.  Difficulty with: ? Solving problems. ? Coping with stress. ? Paying attention. These issues may affect your performance and productivity at work, school, and at home. Lack of sleep may also put you at higher risk for accidents, suicide, and risky behaviors. If you do not get quality sleep you may also be at higher risk for several health problems, including:  Infections.  Type 2 diabetes.  Heart disease.  High blood pressure.  Obesity.  Worsening of long-term conditions, like arthritis, kidney disease, depression, Parkinson's disease, and epilepsy. What actions can I take to get more quality sleep?  Stick to a sleep schedule. Go to sleep and wake up at about the same time each day. Do not try to sleep less on weekdays and make up for lost sleep on weekends. This does not work.  Try to get about 30 minutes of exercise on most days. Do not exercise 2-3 hours before going to bed.  Limit naps during the day to 30 minutes or less.  Do not use any products that contain nicotine or tobacco, such as cigarettes or e-cigarettes. If you need help quitting, ask your health care provider.  Do not drink caffeinated beverages for at least 8 hours before going to bed. Coffee, tea, and some sodas contain caffeine.  Do not drink alcohol close to bedtime.  Do not eat  large meals close to bedtime.  Do not take naps in the late afternoon.  Try to get at least 30 minutes of sunlight every day. Morning sunlight is best.  Make time to relax before bed. Reading, listening to music, or taking a hot bath promotes quality sleep.  Make your bedroom a place that promotes quality sleep. Keep your bedroom dark, quiet, and at a comfortable room temperature. Make sure your bed is comfortable. Take out sleep distractions like TV, a computer, smartphone, and bright lights.  If you are lying awake in bed for longer than 20 minutes, get up and do a relaxing activity until you feel sleepy.  Work with your health care provider to treat medical conditions that may affect sleeping, such as: ? Nasal obstruction. ? Snoring. ? Sleep apnea and other sleep disorders.  Talk to your health care provider if you think any of your prescription medicines may cause you to have difficulty falling or staying asleep.  If you have sleep problems, talk with a sleep consultant. If you think you have a sleep disorder, talk with your health care provider about getting evaluated by a specialist.      Where to find more information  National Sleep Foundation website: https://sleepfoundation.org  National Heart, Lung, and Blood Institute (NHLBI): www.nhlbi.nih.gov/files/docs/public/sleep/healthy_sleep.pdf  Centers for Disease Control and Prevention (CDC): www.cdc.gov/sleep/index.html Contact a health care provider if you:  Have trouble getting to sleep or staying asleep.  Often wake   up very early in the morning and cannot get back to sleep.  Have daytime sleepiness.  Have daytime sleep attacks of suddenly falling asleep and sudden muscle weakness (narcolepsy).  Have a tingling sensation in your legs with a strong urge to move your legs (restless legs syndrome).  Stop breathing briefly during sleep (sleep apnea).  Think you have a sleep disorder or are taking a medicine that is  affecting your quality of sleep. Summary  Most adults need 7-8 hours of quality sleep each night.  Getting enough quality sleep is an important part of health and well-being.  Make your bedroom a place that promotes quality sleep and avoid things that may cause you to have poor sleep, such as alcohol, caffeine, smoking, and large meals.  Talk to your health care provider if you have trouble falling asleep or staying asleep. This information is not intended to replace advice given to you by your health care provider. Make sure you discuss any questions you have with your health care provider. Document Revised: 05/09/2017 Document Reviewed: 05/09/2017 Elsevier Patient Education  2021 Elsevier Inc. Insomnia Insomnia is a sleep disorder that makes it difficult to fall asleep or stay asleep. Insomnia can cause fatigue, low energy, difficulty concentrating, mood swings, and poor performance at work or school. There are three different ways to classify insomnia:  Difficulty falling asleep.  Difficulty staying asleep.  Waking up too early in the morning. Any type of insomnia can be long-term (chronic) or short-term (acute). Both are common. Short-term insomnia usually lasts for three months or less. Chronic insomnia occurs at least three times a week for longer than three months. What are the causes? Insomnia may be caused by another condition, situation, or substance, such as:  Anxiety.  Certain medicines.  Gastroesophageal reflux disease (GERD) or other gastrointestinal conditions.  Asthma or other breathing conditions.  Restless legs syndrome, sleep apnea, or other sleep disorders.  Chronic pain.  Menopause.  Stroke.  Abuse of alcohol, tobacco, or illegal drugs.  Mental health conditions, such as depression.  Caffeine.  Neurological disorders, such as Alzheimer's disease.  An overactive thyroid (hyperthyroidism). Sometimes, the cause of insomnia may not be known. What  increases the risk? Risk factors for insomnia include:  Gender. Women are affected more often than men.  Age. Insomnia is more common as you get older.  Stress.  Lack of exercise.  Irregular work schedule or working night shifts.  Traveling between different time zones.  Certain medical and mental health conditions. What are the signs or symptoms? If you have insomnia, the main symptom is having trouble falling asleep or having trouble staying asleep. This may lead to other symptoms, such as:  Feeling fatigued or having low energy.  Feeling nervous about going to sleep.  Not feeling rested in the morning.  Having trouble concentrating.  Feeling irritable, anxious, or depressed. How is this diagnosed? This condition may be diagnosed based on:  Your symptoms and medical history. Your health care provider may ask about: ? Your sleep habits. ? Any medical conditions you have. ? Your mental health.  A physical exam. How is this treated? Treatment for insomnia depends on the cause. Treatment may focus on treating an underlying condition that is causing insomnia. Treatment may also include:  Medicines to help you sleep.  Counseling or therapy.  Lifestyle adjustments to help you sleep better. Follow these instructions at home: Eating and drinking  Limit or avoid alcohol, caffeinated beverages, and cigarettes, especially close to bedtime.   These can disrupt your sleep.  Do not eat a large meal or eat spicy foods right before bedtime. This can lead to digestive discomfort that can make it hard for you to sleep.   Sleep habits  Keep a sleep diary to help you and your health care provider figure out what could be causing your insomnia. Write down: ? When you sleep. ? When you wake up during the night. ? How well you sleep. ? How rested you feel the next day. ? Any side effects of medicines you are taking. ? What you eat and drink.  Make your bedroom a dark, comfortable  place where it is easy to fall asleep. ? Put up shades or blackout curtains to block light from outside. ? Use a white noise machine to block noise. ? Keep the temperature cool.  Limit screen use before bedtime. This includes: ? Watching TV. ? Using your smartphone, tablet, or computer.  Stick to a routine that includes going to bed and waking up at the same times every day and night. This can help you fall asleep faster. Consider making a quiet activity, such as reading, part of your nighttime routine.  Try to avoid taking naps during the day so that you sleep better at night.  Get out of bed if you are still awake after 15 minutes of trying to sleep. Keep the lights down, but try reading or doing a quiet activity. When you feel sleepy, go back to bed.   General instructions  Take over-the-counter and prescription medicines only as told by your health care provider.  Exercise regularly, as told by your health care provider. Avoid exercise starting several hours before bedtime.  Use relaxation techniques to manage stress. Ask your health care provider to suggest some techniques that may work well for you. These may include: ? Breathing exercises. ? Routines to release muscle tension. ? Visualizing peaceful scenes.  Make sure that you drive carefully. Avoid driving if you feel very sleepy.  Keep all follow-up visits as told by your health care provider. This is important. Contact a health care provider if:  You are tired throughout the day.  You have trouble in your daily routine due to sleepiness.  You continue to have sleep problems, or your sleep problems get worse. Get help right away if:  You have serious thoughts about hurting yourself or someone else. If you ever feel like you may hurt yourself or others, or have thoughts about taking your own life, get help right away. You can go to your nearest emergency department or call:  Your local emergency services (911 in the  [place]  A suicide crisis helpline, such as the National Suicide Prevention Lifeline at 1-800-273-8255. This is open 24 hours a day. Summary  Insomnia is a sleep disorder that makes it difficult to fall asleep or stay asleep.  Insomnia can be long-term (chronic) or short-term (acute).  Treatment for insomnia depends on the cause. Treatment may focus on treating an underlying condition that is causing insomnia.  Keep a sleep diary to help you and your health care provider figure out what could be causing your insomnia. This information is not intended to replace advice given to you by your health care provider. Make sure you discuss any questions you have with your health care provider. Document Revised: 12/11/2019 Document Reviewed: 12/11/2019 Elsevier Patient Education  2021 Elsevier Inc.  

## 2020-04-26 NOTE — Addendum Note (Signed)
Addended by: Larey Seat on: 04/26/2020 01:51 PM   Modules accepted: Orders

## 2020-04-28 ENCOUNTER — Telehealth: Payer: Self-pay

## 2020-04-28 NOTE — Telephone Encounter (Signed)
LVM for pt to call me back to schedule sleep study  

## 2020-05-19 ENCOUNTER — Ambulatory Visit (INDEPENDENT_AMBULATORY_CARE_PROVIDER_SITE_OTHER): Payer: Medicare Other | Admitting: Neurology

## 2020-05-19 DIAGNOSIS — G4733 Obstructive sleep apnea (adult) (pediatric): Secondary | ICD-10-CM | POA: Diagnosis not present

## 2020-05-19 DIAGNOSIS — Z6841 Body Mass Index (BMI) 40.0 and over, adult: Secondary | ICD-10-CM

## 2020-05-19 DIAGNOSIS — E1165 Type 2 diabetes mellitus with hyperglycemia: Secondary | ICD-10-CM

## 2020-05-19 DIAGNOSIS — I10 Essential (primary) hypertension: Secondary | ICD-10-CM

## 2020-05-19 DIAGNOSIS — F5104 Psychophysiologic insomnia: Secondary | ICD-10-CM

## 2020-05-19 DIAGNOSIS — R5383 Other fatigue: Secondary | ICD-10-CM

## 2020-05-19 DIAGNOSIS — F32A Depression, unspecified: Secondary | ICD-10-CM

## 2020-05-19 DIAGNOSIS — K219 Gastro-esophageal reflux disease without esophagitis: Secondary | ICD-10-CM

## 2020-05-19 DIAGNOSIS — R0683 Snoring: Secondary | ICD-10-CM

## 2020-05-20 NOTE — Progress Notes (Signed)
   [facility] [place] ([facility])  STUDY DATE: 05/20/20  DOB: Jan 15, 1951  MRN: [id number]  ORDERING CLINICIAN: Larey Seat, MD   REFERRING CLINICIAN: Glendale Chard, MD   CLINICAL INFORMATION/HISTORY: Michelle Perazzo Mayfieldis a 70 year -old  African American female patient,seen here upon referral by Dr Baird Cancer on 04/26/2020, for a sleep consultation. . Chiefconcernaccording to patient : Presents to address insomnia concerns. Last PSG 10+ yrs ago  was negative for OSA. She states that she wakes up during the night . Her husband told her she snores. She is trying to catch her breath and sleep for 5-6 hours of broken sleep, not  restorative, nor refreshing  Michelle Hicks has a medical history of : Dysphagia due to GERD, hyperlipidemia, Hypothyroidsism, Depression, DM type 2 on Ozempic(diabetes mellitus) with Diabetic Neuropathy, Morbid Obesity III ([place]), and HTN (hypertension). She has chronic difficulties to initiate sleep.  Epworth sleepiness score: 9 /24. BMI: 42.2 kg/m Neck Circumference: 16.5 "  FINDINGS:   Total Record Time (hours, min): 10 h 3 min Total Sleep Time (hours, min):  8 h 15 min   Percent REM (%):    29.08 %   Calculated pAHI (per hour):  35.3       REM pAHI: 53.4    NREM pAHI: 27.1  Supine AHI: N/A   Oxygen Saturation (%) Mean: 94  Minimum oxygen saturation (%):        80   O2 Saturation Range (%): 80-99  O2Saturation (minutes) <=88%: 0.2 min  Pulse Mean (bpm):    80  Pulse Range (66-105)   IMPRESSION: This HST confirmed the presence of severe OSA (obstructive sleep apnea) with  strong REM sleep accentuation ( REM AHI was 53/h and NREM AHI was 27/h.). there was no clinically significant degree of hypoxia and positional data and snoring data were not available.    RECOMMENDATION: This degree of OSA with REM sleep dependence is not treatable by dental device or inspire device. Reduction in BMI will support reduction in apnea.    Alleviation of AHI requires PAP therapy, and I will order auto- CPAP with a mask of patient's choice, with the settings of 6 cm- 16 cm water pressure and 3 cm EPR, under heated humidity.  The patient may benefit from ENT evaluation for recurrent sinusitis and for nasal patency.     INTERPRETING PHYSICIAN:  Larey Seat, MD  [facility] Sleep Board certified by The [employer] of Sleep Medicine and Diplomate of the [employer] of Sleep Medicine. Board certified In Neurology through the [employer], Fellow of the [employer] of Neurology. Medical Director of [employer].

## 2020-05-25 ENCOUNTER — Telehealth: Payer: Medicare Other

## 2020-06-06 NOTE — Progress Notes (Signed)
IMPRESSION: This HST confirmed the presence of severe OSA (obstructive sleep apnea) with  strong REM sleep accentuation ( REM AHI was 53/h and NREM AHI was 27/h.). there was no clinically significant degree of hypoxia and positional data and snoring data were not available.    RECOMMENDATION: This degree of OSA with REM sleep dependence is not fully treatable by dental device or inspire device. Reduction in BMI will support reduction in apnea.   Alleviation of AHI requires PAP therapy, and I will order auto- CPAP with a mask of patient's choice, with the settings of 6 cm- 16 cm water pressure and 3 cm EPR, under heated humidity.  The patient may benefit from ENT evaluation for recurrent sinusitis and for nasal patency.

## 2020-06-06 NOTE — Addendum Note (Signed)
Addended by: Larey Seat on: 06/06/2020 05:50 PM   Modules accepted: Orders

## 2020-06-08 ENCOUNTER — Telehealth: Payer: Self-pay | Admitting: Neurology

## 2020-06-08 NOTE — Telephone Encounter (Signed)
I called pt. I advised pt that Dr. Brett Fairy  reviewed their sleep study results and found that pt has severe sleep apnea. Dr. Brett Fairy recommends that pt starts auto CPAP. I reviewed PAP compliance expectations with the pt. Pt is agreeable to starting a CPAP. I advised pt that an order will be sent to a DME, [facility] and [facility]) will call the pt within about one week after they file with the pt's insurance. [facility]) will show the pt how to use the machine, fit for masks, and troubleshoot the CPAP if needed. A follow up appt was made for insurance purposes with Ward Givens, NP on Aug 30,2022 at 9:30 am. Pt verbalized understanding to arrive 15 minutes early and bring their CPAP. A letter with all of this information in it will be mailed to the pt as a reminder. I verified with the pt that the address we have on file is correct. Pt verbalized understanding of results. Pt had no questions at this time but was encouraged to call back if questions arise. I have sent the order to [facility]) and have received confirmation that they have received the order.

## 2020-06-08 NOTE — Telephone Encounter (Signed)
-----   Message from Larey Seat, MD sent at 06/06/2020  5:50 PM EDT ----- IMPRESSION: This HST confirmed the presence of severe OSA (obstructive sleep apnea) with  strong REM sleep accentuation ( REM AHI was 53/h and NREM AHI was 27/h.). there was no clinically significant degree of hypoxia and positional data and snoring data were not available.    RECOMMENDATION: This degree of OSA with REM sleep dependence is not fully treatable by dental device or inspire device. Reduction in BMI will support reduction in apnea.   Alleviation of AHI requires PAP therapy, and I will order auto- CPAP with a mask of patient's choice, with the settings of 6 cm- 16 cm water pressure and 3 cm EPR, under heated humidity.  The patient may benefit from ENT evaluation for recurrent sinusitis and for nasal patency.

## 2020-06-08 NOTE — Telephone Encounter (Signed)
Called the patient on home phone, there was no answer. Called the cell phone and was able to LVM advising the patient to call back.

## 2020-06-08 NOTE — Telephone Encounter (Signed)
Pt has called Casey,RN back.  Please call 

## 2020-06-17 ENCOUNTER — Telehealth: Payer: Self-pay

## 2020-06-17 NOTE — Telephone Encounter (Signed)
Called patient to let her know that her Ozempic is here through patient assistance. The patient answered the phone and voiced understanding.   Total Time: 4 minutes  Orlando Penner, PharmD Clinical Pharmacist [facility] [phone]

## 2020-07-01 ENCOUNTER — Telehealth: Payer: Self-pay

## 2020-07-01 ENCOUNTER — Other Ambulatory Visit: Payer: Self-pay | Admitting: Internal Medicine

## 2020-07-01 NOTE — Chronic Care Management (AMB) (Signed)
Chronic Care Management Pharmacy Assistant   Name: Michelle Hicks  MRN: [id number] DOB: 1950/03/15   Reason for Encounter: Disease State/ Diabetes    Recent office visits:  03-24-2020 Glendale Chard, MD  07-06-2020 Glendale Chard, MD. STOP probiotic.    Recent consult visits:  04-26-2020 Dohmeier, Asencion Partridge, MD (Neurology). START trazodone 50 mg at bedtime as needed.  05-19-2020 Dohmeier, Asencion Partridge, MD (Neurology). Home sleep test ordered.   Hospital visits:  None in previous 6 months  Medications: Outpatient Encounter Medications as of 07/01/2020  Medication Sig  . aspirin EC 81 MG tablet Take 81 mg by mouth daily. Some times  . Cholecalciferol (VITAMIN D-3 PO) Take 2,000 Units by mouth daily.   Marland Kitchen docusate sodium (COLACE) 100 MG capsule Take 200 mg by mouth daily as needed.   . hydrochlorothiazide (HYDRODIURIL) 25 MG tablet TAKE 1 TABLET BY MOUTH  DAILY  . Lancets Misc. (ONE TOUCH SURESOFT) MISC Use as directed to check blood sugars 2 times per day dx e11.65  . levothyroxine (SYNTHROID) 50 MCG tablet Take 50 mcg by mouth daily.   . meloxicam (MOBIC) 15 MG tablet TAKE 1 TABLET BY MOUTH  DAILY AS NEEDED  . Multiple Vitamin (MULTIVITAMIN) tablet Take 1 tablet by mouth daily.  Marland Kitchen omeprazole (PRILOSEC) 40 MG capsule TAKE 1 CAPSULE BY MOUTH  DAILY BEFORE A MEAL  . ONETOUCH ULTRA test strip USE AS DIRECTED TO CHECK  BLOOD SUGAR TWICE DAILY  . pravastatin (PRAVACHOL) 40 MG tablet Take 1 tablet (40 mg total) by mouth daily. (Patient taking differently: Take 40 mg by mouth daily. Patient takes 1/2 tablet (20 mg))  . Probiotic Product (PROBIOTIC DAILY PO) Take by mouth.   . Semaglutide, 1 MG/DOSE, (OZEMPIC, 1 MG/DOSE,) 2 MG/1.5ML SOPN Inject 1 mg into the skin once a week.  . telmisartan (MICARDIS) 80 MG tablet TAKE 1 TABLET BY MOUTH  DAILY  . traZODone (DESYREL) 50 MG tablet Take 1 tablet (50 mg total) by mouth at bedtime as needed for sleep.   No facility-administered encounter  medications on file as of 07/01/2020.   Recent Relevant Labs: Lab Results  Component Value Date/Time   HGBA1C 7.1 (H) 03/24/2020 10:34 AM   HGBA1C 7.8 (H) 12/03/2019 09:58 AM   HGBA1C 7.3 09/04/2017 12:00 AM   MICROALBUR 30 03/24/2020 10:27 AM   MICROALBUR 30 03/18/2019 10:22 AM    Kidney Function Lab Results  Component Value Date/Time   CREATININE 0.83 03/24/2020 10:34 AM   CREATININE 0.71 12/03/2019 09:58 AM   GFRNONAA 73 03/24/2020 10:34 AM   GFRAA 84 03/24/2020 10:34 AM    . Current antihyperglycemic regimen:  ? Ozempic 1 mg weekly  . What recent interventions/DTPs have been made to improve glycemic control:  o Patient states she is taking medications as directed   . Have there been any recent hospitalizations or ED visits since last visit with CPP? No   . Patient denies hypoglycemic symptoms  . Patient denies hyperglycemic symptoms-  . How often are you checking your blood sugar? once daily . What are your blood sugars ranging?  o Fasting: 133 o Before meals: none o After meals: none o Bedtime: none . During the week, how often does your blood glucose drop below 70? Never   . Are you checking your feet daily/regularly? Patient states daily  Adherence Review: Is the patient currently on a STATIN medication? Yes Is the patient currently on ACE/ARB medication? Yes Does the patient have >5 day gap between  last estimated fill dates? No  NOTES Sent scheduling a message to schedule patient with Orlando Penner on 08-31-2020 at 11:15.  Star Rating Drugs: Telmisartan 80mg - Last filled 06-01-2020 [facility] Pravastatin 40mg - Last filled 10-28-2018 [facility] Ozempic 1mg - Patient assistance   [place] Pharmacist Assistant [phone]

## 2020-07-06 ENCOUNTER — Other Ambulatory Visit: Payer: Self-pay

## 2020-07-06 ENCOUNTER — Encounter: Payer: Self-pay | Admitting: Internal Medicine

## 2020-07-06 ENCOUNTER — Ambulatory Visit (INDEPENDENT_AMBULATORY_CARE_PROVIDER_SITE_OTHER): Payer: Medicare Other | Admitting: Internal Medicine

## 2020-07-06 VITALS — BP 124/72 | HR 90 | Temp 98.5°F | Ht 65.0 in | Wt 253.0 lb

## 2020-07-06 DIAGNOSIS — Z0000 Encounter for general adult medical examination without abnormal findings: Secondary | ICD-10-CM | POA: Diagnosis not present

## 2020-07-06 DIAGNOSIS — Z6841 Body Mass Index (BMI) 40.0 and over, adult: Secondary | ICD-10-CM | POA: Diagnosis not present

## 2020-07-06 DIAGNOSIS — E1165 Type 2 diabetes mellitus with hyperglycemia: Secondary | ICD-10-CM | POA: Diagnosis not present

## 2020-07-06 DIAGNOSIS — I10 Essential (primary) hypertension: Secondary | ICD-10-CM

## 2020-07-06 LAB — POCT UA - MICROALBUMIN
Albumin/Creatinine Ratio, Urine, POC: <30
Creatinine, POC: 50 mg/dL
Microalbumin Ur, POC: 10 mg/L

## 2020-07-06 LAB — POCT URINALYSIS DIPSTICK
Bilirubin, UA: NEGATIVE
Blood, UA: NEGATIVE
Glucose, UA: NEGATIVE
Ketones, UA: NEGATIVE
Nitrite, UA: NEGATIVE
Protein, UA: NEGATIVE
Spec Grav, UA: 1.02 (ref 1.010–1.025)
Urobilinogen, UA: 0.2 E.U./dL
pH, UA: 6.5 (ref 5.0–8.0)

## 2020-07-06 MED ORDER — OMEPRAZOLE 40 MG PO CPDR: DELAYED_RELEASE_CAPSULE | ORAL | 2 refills | Status: DC

## 2020-07-06 MED ORDER — PRAVASTATIN SODIUM 40 MG PO TABS: 40.0000 mg | ORAL_TABLET | Freq: Every day | ORAL | 2 refills | Status: DC

## 2020-07-06 MED ORDER — HYDROCHLOROTHIAZIDE 25 MG PO TABS: 1.0000 | ORAL_TABLET | Freq: Every day | ORAL | 3 refills | Status: DC

## 2020-07-06 NOTE — Progress Notes (Signed)
I,Katawbba Wiggins,acting as a Education administrator for [name], MD.,have documented all relevant documentation on the behalf of [name], MD,as directed by  [name], MD while in the presence of [name], MD.  This visit occurred during the SARS-CoV-2 public health emergency.  Safety protocols were in place, including screening questions prior to the visit, additional usage of staff PPE, and extensive cleaning of exam room while observing appropriate contact time as indicated for disinfecting solutions.  Subjective:     Patient ID: Michelle Hicks , female    DOB: 01-Apr-1950 , 70 y.o.   MRN: [id number]   Chief Complaint  Patient presents with  . Annual Exam  . Diabetes  . Hypertension    HPI  She is here today for a full physical examination. She is followed by [facility] for her GYN exams. She was last seen in 2020. She reports compliance with meds, but states her sugars are elevated. She denies change in daily activities, appetite. Denies headaches, chest pain and shortness of breath.   Diabetes She presents for her follow-up diabetic visit. She has type 2 diabetes mellitus. Her disease course has been stable. There are no hypoglycemic associated symptoms. Pertinent negatives for diabetes include no blurred vision and no chest pain. There are no hypoglycemic complications. Symptoms are stable. Risk factors for coronary artery disease include diabetes mellitus, dyslipidemia, hypertension, post-menopausal, sedentary lifestyle and obesity. She is compliant with treatment most of the time. She is following a generally healthy diet. She participates in exercise intermittently. An ACE inhibitor/angiotensin II receptor blocker is being taken.  Hypertension This is a chronic problem. The current episode started more than 1 year ago. The problem has been gradually improving since onset. The problem is controlled. Pertinent negatives include no blurred vision, chest pain, palpitations or  shortness of breath. The current treatment provides moderate improvement.     Past Medical History:  Diagnosis Date  . Depression   . DM (diabetes mellitus) ([place])   . HTN (hypertension)      Family History  Problem Relation Age of Onset  . Diabetes Mother   . Pancreatic cancer Mother   . Hypertension Father   . Heart attack Father      Current Outpatient Medications:  .  aspirin EC 81 MG tablet, Take 81 mg by mouth daily. Some times, Disp: , Rfl:  .  Cholecalciferol (VITAMIN D-3 PO), Take 2,000 Units by mouth daily. , Disp: , Rfl:  .  docusate sodium (COLACE) 100 MG capsule, Take 200 mg by mouth daily as needed. , Disp: , Rfl:  .  Lancets Misc. (ONE TOUCH SURESOFT) MISC, Use as directed to check blood sugars 2 times per day dx e11.65, Disp: 300 each, Rfl: 2 .  levothyroxine (SYNTHROID) 50 MCG tablet, Take 50 mcg by mouth daily. , Disp: , Rfl:  .  meloxicam (MOBIC) 15 MG tablet, TAKE 1 TABLET BY MOUTH  DAILY AS NEEDED, Disp: 90 tablet, Rfl: 3 .  Multiple Vitamin (MULTIVITAMIN) tablet, Take 1 tablet by mouth daily., Disp: , Rfl:  .  ONETOUCH ULTRA test strip, USE AS DIRECTED TO CHECK  BLOOD SUGAR TWICE DAILY, Disp: 200 strip, Rfl: 3 .  Semaglutide, 1 MG/DOSE, (OZEMPIC, 1 MG/DOSE,) 2 MG/1.5ML SOPN, Inject 1 mg into the skin once a week., Disp: 6 pen, Rfl: 1 .  telmisartan (MICARDIS) 80 MG tablet, TAKE 1 TABLET BY MOUTH  DAILY, Disp: 90 tablet, Rfl: 3 .  hydrochlorothiazide (HYDRODIURIL) 25 MG tablet,  Take 1 tablet (25 mg total) by mouth daily., Disp: 90 tablet, Rfl: 3 .  omeprazole (PRILOSEC) 40 MG capsule, TAKE 1 CAPSULE BY MOUTH  DAILY BEFORE A MEAL, Disp: 90 capsule, Rfl: 2 .  pravastatin (PRAVACHOL) 40 MG tablet, Take 1 tablet (40 mg total) by mouth daily. (Patient taking differently: Take 40 mg by mouth daily. 1/2 tab daily), Disp: 90 tablet, Rfl: 2 .  traZODone (DESYREL) 50 MG tablet, Take 1 tablet (50 mg total) by mouth at bedtime as needed for sleep. (Patient not taking:  Reported on 07/06/2020), Disp: 30 tablet, Rfl: 2   Allergies  Allergen Reactions  . Sulfa Antibiotics       The patient states she uses post menopausal status for birth control. Last LMP was No LMP recorded. Patient has had a hysterectomy.. Negative for Dysmenorrhea. Negative for: breast discharge, breast lump(s), breast pain and breast self exam. Associated symptoms include abnormal vaginal bleeding. Pertinent negatives include abnormal bleeding (hematology), anxiety, decreased libido, depression, difficulty falling sleep, dyspareunia, history of infertility, nocturia, sexual dysfunction, sleep disturbances, urinary incontinence, urinary urgency, vaginal discharge and vaginal itching. Diet regular.The patient states her exercise level is  intermittent.  . The patient's tobacco use is:  Social History   Tobacco Use  Smoking Status Former Smoker  . Packs/day: 0.50  . Years: 15.00  . Pack years: 7.50  . Types: Cigarettes  Smokeless Tobacco Never Used  Tobacco Comment   20 years ago  . She has been exposed to passive smoke. The patient's alcohol use is:  Social History   Substance and Sexual Activity  Alcohol Use Not Currently   Comment: socially    Review of Systems  Constitutional: Negative.   HENT: Negative.   Eyes: Negative.  Negative for blurred vision.  Respiratory: Negative.  Negative for shortness of breath.   Cardiovascular: Negative.  Negative for chest pain and palpitations.  Gastrointestinal: Negative.   Endocrine: Negative.   Genitourinary: Negative.   Musculoskeletal: Negative.   Skin: Negative.   Allergic/Immunologic: Negative.   Neurological: Negative.   Hematological: Negative.   Psychiatric/Behavioral: Negative.   All other systems reviewed and are negative.    Today's Vitals   07/06/20 1132  BP: 124/72  Pulse: 90  Temp: 98.5 F (36.9 C)  TempSrc: Oral  Weight: 253 lb (114.8 kg)  Height: 5' 5"  (1.651 m)  PainSc: 0-No pain   Body mass index is  42.1 kg/m.  Wt Readings from Last 3 Encounters:  07/06/20 253 lb (114.8 kg)  04/26/20 247 lb (112 kg)  03/24/20 245 lb (111.1 kg)   BP Readings from Last 3 Encounters:  07/06/20 124/72  04/26/20 (!) 142/80  03/24/20 124/66   Objective:  Physical Exam Vitals and nursing note reviewed.  Constitutional:      Appearance: Normal appearance. She is obese.  HENT:     Head: Normocephalic and atraumatic.     Right Ear: Tympanic membrane, ear canal and external ear normal.     Left Ear: Tympanic membrane, ear canal and external ear normal.     Nose:     Comments: Deferred, masked     Mouth/Throat:     Comments: Deferred, masked  Eyes:     Extraocular Movements: Extraocular movements intact.     Conjunctiva/sclera: Conjunctivae normal.     Pupils: Pupils are equal, round, and reactive to light.  Cardiovascular:     Rate and Rhythm: Normal rate and regular rhythm.     Pulses: Normal pulses.  Dorsalis pedis pulses are 2+ on the right side and 2+ on the left side.     Heart sounds: Normal heart sounds.  Pulmonary:     Effort: Pulmonary effort is normal.     Breath sounds: Normal breath sounds.  Chest:  Breasts:     Tanner Score is 5.     Right: Normal.     Left: Normal.    Abdominal:     General: Bowel sounds are normal.     Palpations: Abdomen is soft.     Comments: Obese, soft. Difficult to assess organomegaly  Genitourinary:    Comments: deferred Musculoskeletal:        General: Normal range of motion.     Cervical back: Normal range of motion and neck supple.  Feet:     Right foot:     Protective Sensation: 5 sites tested. 5 sites sensed.     Skin integrity: Dry skin present.     Toenail Condition: Right toenails are normal.     Left foot:     Protective Sensation: 5 sites tested. 5 sites sensed.     Skin integrity: Dry skin present.     Toenail Condition: Left toenails are normal.  Skin:    General: Skin is warm and dry.  Neurological:     General: No  focal deficit present.     Mental Status: She is alert and oriented to person, place, and time.  Psychiatric:        Mood and Affect: Mood normal.        Behavior: Behavior normal.         Assessment And Plan:     1. Routine general medical examination at health care facility Comments: A full exam was performed. Importance of monthly self breast exams was discussed with the patient. PATIENT IS ADVISED TO GET 30-45 MINUTES REGULAR EXERCISE NO LESS THAN FOUR TO FIVE DAYS PER WEEK - BOTH WEIGHTBEARING EXERCISES AND AEROBIC ARE RECOMMENDED.  PATIENT IS ADVISED TO FOLLOW A HEALTHY DIET WITH AT LEAST SIX FRUITS/VEGGIES PER DAY, DECREASE INTAKE OF RED MEAT, AND TO INCREASE FISH INTAKE TO TWO DAYS PER WEEK.  MEATS/FISH SHOULD NOT BE FRIED, BAKED OR BROILED IS PREFERABLE.  IT IS ALSO IMPORTANT TO CUT BACK ON YOUR SUGAR INTAKE. PLEASE AVOID ANYTHING WITH ADDED SUGAR, CORN SYRUP OR OTHER SWEETENERS. IF YOU MUST USE A SWEETENER, YOU CAN TRY STEVIA. IT IS ALSO IMPORTANT TO AVOID ARTIFICIALLY SWEETENERS AND DIET BEVERAGES. LASTLY, I SUGGEST WEARING SPF 50 SUNSCREEN ON EXPOSED PARTS AND ESPECIALLY WHEN IN THE DIRECT SUNLIGHT FOR AN EXTENDED PERIOD OF TIME.  PLEASE AVOID FAST FOOD RESTAURANTS AND INCREASE YOUR WATER INTAKE.   2. Uncontrolled type 2 diabetes mellitus with hyperglycemia ([place]) Comments: Diabetic foot exam was performed. We discussed her elevated blood sugars at length.  She is encouraged to follow dietary recommendations. I would like to add SGLT2 inhibitor/metformin product. Another option is to increase the dose of Ozempic to 45m to help her achieve her weight loss goals. I will make further recommendations once her labs are available for review.   I DISCUSSED WITH THE PATIENT AT LENGTH REGARDING THE GOALS OF GLYCEMIC CONTROL AND POSSIBLE LONG-TERM COMPLICATIONS.  I  ALSO STRESSED THE IMPORTANCE OF COMPLIANCE WITH HOME GLUCOSE MONITORING, DIETARY RESTRICTIONS INCLUDING AVOIDANCE OF SUGARY  DRINKS/PROCESSED FOODS,  ALONG WITH REGULAR EXERCISE.  I  ALSO STRESSED THE IMPORTANCE OF ANNUAL EYE EXAMS, SELF FOOT CARE AND COMPLIANCE WITH OFFICE VISITS.  - POCT Urinalysis  Dipstick (81002) - POCT UA - Microalbumin - Hemoglobin A1c - CBC - CMP14+EGFR - Lipid panel  3. Essential hypertension, benign Comments: Chronic, well controlled. EKG performed, NSR w/o acute changes.Encouraged to follow low sodium diet. She will c/w olmesartan/hctz. She will RTO in 4 months for re-evaluation.  - EKG 12-Lead - CMP14+EGFR  4. Class 3 severe obesity due to excess calories with serious comorbidity and body mass index (BMI) of 40.0 to 44.9 in adult (HCC) BMI 42. She is encouraged to initially strive for BMI less than 35 to decrease cardiac risk. Advised to aim for at least 150 minutes of exercise per week. Advised to focus on core strengthening exercises as well. I did demonstrate several stretches/exercises to her.   Patient was given opportunity to ask questions. Patient verbalized understanding of the plan and was able to repeat key elements of the plan. All questions were answered to their satisfaction.   I, [name], MD, have reviewed all documentation for this visit. The documentation on 07/06/20 for the exam, diagnosis, procedures, and orders are all accurate and complete.  THE PATIENT IS ENCOURAGED TO PRACTICE SOCIAL DISTANCING DUE TO THE COVID-19 PANDEMIC.

## 2020-07-06 NOTE — Patient Instructions (Signed)
Health Maintenance, Female Adopting a healthy lifestyle and getting preventive care are important in promoting health and wellness. Ask your health care provider about:  The right schedule for you to have regular tests and exams.  Things you can do on your own to prevent diseases and keep yourself healthy. What should I know about diet, weight, and exercise? Eat a healthy diet  Eat a diet that includes plenty of vegetables, fruits, low-fat dairy products, and lean protein.  Do not eat a lot of foods that are high in solid fats, added sugars, or sodium.   Maintain a healthy weight Body mass index (BMI) is used to identify weight problems. It estimates body fat based on height and weight. Your health care provider can help determine your BMI and help you achieve or maintain a healthy weight. Get regular exercise Get regular exercise. This is one of the most important things you can do for your health. Most adults should:  Exercise for at least 150 minutes each week. The exercise should increase your heart rate and make you sweat (moderate-intensity exercise).  Do strengthening exercises at least twice a week. This is in addition to the moderate-intensity exercise.  Spend less time sitting. Even light physical activity can be beneficial. Watch cholesterol and blood lipids Have your blood tested for lipids and cholesterol at 68 years of age, then have this test every 5 years. Have your cholesterol levels checked more often if:  Your lipid or cholesterol levels are high.  You are older than 68 years of age.  You are at high risk for heart disease. What should I know about cancer screening? Depending on your health history and family history, you may need to have cancer screening at various ages. This may include screening for:  Breast cancer.  Cervical cancer.  Colorectal cancer.  Skin cancer.  Lung cancer. What should I know about heart disease, diabetes, and high blood  pressure? Blood pressure and heart disease  High blood pressure causes heart disease and increases the risk of stroke. This is more likely to develop in people who have high blood pressure readings, are of African descent, or are overweight.  Have your blood pressure checked: ? Every 3-5 years if you are 18-39 years of age. ? Every year if you are 40 years old or older. Diabetes Have regular diabetes screenings. This checks your fasting blood sugar level. Have the screening done:  Once every three years after age 40 if you are at a normal weight and have a low risk for diabetes.  More often and at a younger age if you are overweight or have a high risk for diabetes. What should I know about preventing infection? Hepatitis B If you have a higher risk for hepatitis B, you should be screened for this virus. Talk with your health care provider to find out if you are at risk for hepatitis B infection. Hepatitis C Testing is recommended for:  Everyone born from 1945 through 1965.  Anyone with known risk factors for hepatitis C. Sexually transmitted infections (STIs)  Get screened for STIs, including gonorrhea and chlamydia, if: ? You are sexually active and are younger than 68 years of age. ? You are older than 68 years of age and your health care provider tells you that you are at risk for this type of infection. ? Your sexual activity has changed since you were last screened, and you are at increased risk for chlamydia or gonorrhea. Ask your health care provider   if you are at risk.  Ask your health care provider about whether you are at high risk for HIV. Your health care provider may recommend a prescription medicine to help prevent HIV infection. If you choose to take medicine to prevent HIV, you should first get tested for HIV. You should then be tested every 3 months for as long as you are taking the medicine. Pregnancy  If you are about to stop having your period (premenopausal) and  you may become pregnant, seek counseling before you get pregnant.  Take 400 to 800 micrograms (mcg) of folic acid every day if you become pregnant.  Ask for birth control (contraception) if you want to prevent pregnancy. Osteoporosis and menopause Osteoporosis is a disease in which the bones lose minerals and strength with aging. This can result in bone fractures. If you are 65 years old or older, or if you are at risk for osteoporosis and fractures, ask your health care provider if you should:  Be screened for bone loss.  Take a calcium or vitamin D supplement to lower your risk of fractures.  Be given hormone replacement therapy (HRT) to treat symptoms of menopause. Follow these instructions at home: Lifestyle  Do not use any products that contain nicotine or tobacco, such as cigarettes, e-cigarettes, and chewing tobacco. If you need help quitting, ask your health care provider.  Do not use street drugs.  Do not share needles.  Ask your health care provider for help if you need support or information about quitting drugs. Alcohol use  Do not drink alcohol if: ? Your health care provider tells you not to drink. ? You are pregnant, may be pregnant, or are planning to become pregnant.  If you drink alcohol: ? Limit how much you use to 0-1 drink a day. ? Limit intake if you are breastfeeding.  Be aware of how much alcohol is in your drink. In the [place] one drink equals one 12 oz bottle of beer (355 mL), one 5 oz glass of wine (148 mL), or one 1 oz glass of hard liquor (44 mL). General instructions  Schedule regular health, dental, and eye exams.  Stay current with your vaccines.  Tell your health care provider if: ? You often feel depressed. ? You have ever been abused or do not feel safe at home. Summary  Adopting a healthy lifestyle and getting preventive care are important in promoting health and wellness.  Follow your health care provider's instructions about healthy  diet, exercising, and getting tested or screened for diseases.  Follow your health care provider's instructions on monitoring your cholesterol and blood pressure. This information is not intended to replace advice given to you by your health care provider. Make sure you discuss any questions you have with your health care provider. Document Revised: 01/23/2018 Document Reviewed: 01/23/2018 Elsevier Patient Education  2021 Elsevier Inc.  

## 2020-07-07 LAB — CMP14+EGFR
ALT: 32 IU/L (ref 0–32)
AST: 31 IU/L (ref 0–40)
Albumin/Globulin Ratio: 1.4 (ref 1.2–2.2)
Albumin: 4.5 g/dL (ref 3.8–4.8)
Alkaline Phosphatase: 90 IU/L (ref 44–121)
BUN/Creatinine Ratio: 16 (ref 12–28)
BUN: 12 mg/dL (ref 8–27)
Bilirubin Total: 0.3 mg/dL (ref 0.0–1.2)
CO2: 23 mmol/L (ref 20–29)
Calcium: 10.2 mg/dL (ref 8.7–10.3)
Chloride: 105 mmol/L (ref 96–106)
Creatinine, Ser: 0.75 mg/dL (ref 0.57–1.00)
Globulin, Total: 3.3 g/dL (ref 1.5–4.5)
Glucose: 125 mg/dL — ABNORMAL HIGH (ref 65–99)
Potassium: 4.5 mmol/L (ref 3.5–5.2)
Sodium: 143 mmol/L (ref 134–144)
Total Protein: 7.8 g/dL (ref 6.0–8.5)
eGFR: 87 mL/min/{1.73_m2} (ref >59)

## 2020-07-07 LAB — CBC
Hematocrit: 33.5 % — ABNORMAL LOW (ref 34.0–46.6)
Hemoglobin: 11.1 g/dL (ref 11.1–15.9)
MCH: 27.1 pg (ref 26.6–33.0)
MCHC: 33.1 g/dL (ref 31.5–35.7)
MCV: 82 fL (ref 79–97)
Platelets: 312 10*3/uL (ref 150–450)
RBC: 4.09 x10E6/uL (ref 3.77–5.28)
RDW: 13.5 % (ref 11.7–15.4)
WBC: 6.6 10*3/uL (ref 3.4–10.8)

## 2020-07-07 LAB — HEMOGLOBIN A1C
Est. average glucose Bld gHb Est-mCnc: 166 mg/dL
Hgb A1c MFr Bld: 7.4 % — ABNORMAL HIGH (ref 4.8–5.6)

## 2020-07-07 LAB — LIPID PANEL
Chol/HDL Ratio: 2.5 ratio (ref 0.0–4.4)
Cholesterol, Total: 168 mg/dL (ref 100–199)
HDL: 67 mg/dL (ref >39)
LDL Chol Calc (NIH): 89 mg/dL (ref 0–99)
Triglycerides: 61 mg/dL (ref 0–149)
VLDL Cholesterol Cal: 12 mg/dL (ref 5–40)

## 2020-07-09 ENCOUNTER — Encounter: Payer: Self-pay | Admitting: Internal Medicine

## 2020-07-13 ENCOUNTER — Telehealth: Payer: Self-pay

## 2020-07-13 ENCOUNTER — Telehealth: Payer: Medicare Other

## 2020-07-13 NOTE — Telephone Encounter (Signed)
  Care Management   Follow Up Note   07/13/2020 Name: Michelle Hicks MRN: [id number] DOB: Nov 25, 1950   Referred by: Glendale Chard, MD Reason for referral : Chronic Care Management (RNCM Follow up call - 1st attempt )   An unsuccessful telephone outreach was attempted today. The patient was referred to the case management team for assistance with care management and care coordination.   Follow Up Plan: A HIPPA compliant phone message was left for the patient providing contact information and requesting a return call.   Barb Merino, RN, BSN, CCM Care Management Coordinator [place] [facility]  Direct Phone: [phone]

## 2020-07-14 ENCOUNTER — Telehealth: Payer: Self-pay

## 2020-07-14 NOTE — Telephone Encounter (Signed)
  Care Management   Follow Up Note   07/14/2020 Name: Michelle Hicks MRN: [id number] DOB: March 14, 1950   Referred by: Glendale Chard, MD Reason for referral : No chief complaint on file.  Voice message received from patient stating she missed our scheduled call due to forgetting and she is on her way out of town. She states she will return home next week. The patient was referred to the case management team for assistance with care management and care coordination.   Follow Up Plan: Telephone follow up appointment with care management team member scheduled for: 08/24/20  Barb Merino, RN, BSN, CCM Care Management Coordinator [place] [facility]  Direct Phone: [phone]

## 2020-07-15 ENCOUNTER — Ambulatory Visit: Payer: Medicare Other

## 2020-07-15 ENCOUNTER — Ambulatory Visit: Payer: Medicare Other | Admitting: Internal Medicine

## 2020-07-23 ENCOUNTER — Telehealth: Payer: Self-pay

## 2020-07-23 NOTE — Chronic Care Management (AMB) (Signed)
Chronic Care Management Pharmacy Assistant   Name: Michelle Hicks  MRN: [id number] DOB: 1950/03/08   Reason for Encounter: Disease State/ Hypertension, Diabetes  Recent office visits:  07-01-2020 Glendale Chard, MD  Recent consult visits:  None  Hospital visits:  None in previous 6 months  Medications: Outpatient Encounter Medications as of 07/23/2020  Medication Sig   aspirin EC 81 MG tablet Take 81 mg by mouth daily. Some times   Cholecalciferol (VITAMIN D-3 PO) Take 2,000 Units by mouth daily.    docusate sodium (COLACE) 100 MG capsule Take 200 mg by mouth daily as needed.    hydrochlorothiazide (HYDRODIURIL) 25 MG tablet Take 1 tablet (25 mg total) by mouth daily.   Lancets Misc. (ONE TOUCH SURESOFT) MISC Use as directed to check blood sugars 2 times per day dx e11.65   levothyroxine (SYNTHROID) 50 MCG tablet Take 50 mcg by mouth daily.    meloxicam (MOBIC) 15 MG tablet TAKE 1 TABLET BY MOUTH  DAILY AS NEEDED   Multiple Vitamin (MULTIVITAMIN) tablet Take 1 tablet by mouth daily.   omeprazole (PRILOSEC) 40 MG capsule TAKE 1 CAPSULE BY MOUTH  DAILY BEFORE A MEAL   ONETOUCH ULTRA test strip USE AS DIRECTED TO CHECK  BLOOD SUGAR TWICE DAILY   pravastatin (PRAVACHOL) 40 MG tablet Take 1 tablet (40 mg total) by mouth daily. (Patient taking differently: Take 40 mg by mouth daily. 1/2 tab daily)   Semaglutide, 1 MG/DOSE, (OZEMPIC, 1 MG/DOSE,) 2 MG/1.5ML SOPN Inject 1 mg into the skin once a week.   telmisartan (MICARDIS) 80 MG tablet TAKE 1 TABLET BY MOUTH  DAILY   traZODone (DESYREL) 50 MG tablet Take 1 tablet (50 mg total) by mouth at bedtime as needed for sleep. (Patient not taking: Reported on 07/06/2020)   No facility-administered encounter medications on file as of 07/23/2020.   Reviewed chart prior to disease state call. Spoke with patient regarding BP  Recent Office Vitals: BP Readings from Last 3 Encounters:  07/06/20 124/72  04/26/20 (!) 142/80  03/24/20 124/66    Pulse Readings from Last 3 Encounters:  07/06/20 90  04/26/20 77  03/24/20 95    Wt Readings from Last 3 Encounters:  07/06/20 253 lb (114.8 kg)  04/26/20 247 lb (112 kg)  03/24/20 245 lb (111.1 kg)     Kidney Function Lab Results  Component Value Date/Time   CREATININE 0.75 07/06/2020 12:14 PM   CREATININE 0.83 03/24/2020 10:34 AM   GFRNONAA 73 03/24/2020 10:34 AM   GFRAA 84 03/24/2020 10:34 AM    BMP Latest Ref Rng & Units 07/06/2020 03/24/2020 12/03/2019  Glucose 65 - 99 mg/dL 125(H) 115(H) 107(H)  BUN 8 - 27 mg/dL 12 12 9   Creatinine 0.57 - 1.00 mg/dL 0.75 0.83 0.71  BUN/Creat Ratio 12 - 28 16 14 13   Sodium 134 - 144 mmol/L 143 138 139  Potassium 3.5 - 5.2 mmol/L 4.5 4.5 4.2  Chloride 96 - 106 mmol/L 105 98 102  CO2 20 - 29 mmol/L 23 21 20   Calcium 8.7 - 10.3 mg/dL 10.2 10.2 9.6    Current antihypertensive regimen:  HCTZ 25 MG daily Telmisartan 80 MG daily  How often are you checking your Blood Pressure? 1-2x per week  Current home BP readings: 135/70  What recent interventions/DTPs have been made by any provider to improve Blood Pressure control since last CPP Visit: Patient states she is taking medications as directed  Any recent hospitalizations or ED visits since last  visit with [facility]PP? No  What diet changes have been made to improve Blood Pressure Control?  Patient states none  What exercise is being done to improve your Blood Pressure Control?  Patient states she walks daily  Adherence Review: Is the patient currently on ACE/ARB medication? Yes Does the patient have >5 day gap between last estimated fill dates? No  Recent Relevant Labs: Lab Results  Component Value Date/Time   HGBA1C 7.4 (H) 07/06/2020 12:14 PM   HGBA1C 7.1 (H) 03/24/2020 10:34 AM   HGBA1C 7.3 09/04/2017 12:00 AM   MICROALBUR 10 07/06/2020 12:47 PM   MICROALBUR 30 03/24/2020 10:27 AM    Kidney Function Lab Results  Component Value Date/Time   CREATININE 0.75 07/06/2020 12:14  PM   CREATININE 0.83 03/24/2020 10:34 AM   GFRNONAA 73 03/24/2020 10:34 AM   GFRAA 84 03/24/2020 10:34 AM    Current antihyperglycemic regimen:  Ozempic 1 MG weekly  What recent interventions/DTPs have been made to improve glycemic control:  Patient states she is taking medications as directed.  Have there been any recent hospitalizations or ED visits since last visit with [facility]? No  Patient denies hypoglycemic symptoms  Patient denies hyperglycemic symptoms  How often are you checking your blood sugar? once daily  What are your blood sugars ranging?  Fasting: None Before meals: None After meals: 188 Bedtime: None During the week, how often does your blood glucose drop below 70? Never  Are you checking your feet daily/regularly? Patient states daily  Adherence Review: Is the patient currently on a STATIN medication? Yes Is the patient currently on ACE/ARB medication? Yes Does the patient have >5 day gap between last estimated fill dates? No   Star Rating Drugs: Telmisartan 80 MG- Last filled 06-01-2020 90 DS [place] 1 MG- Patient assistance Pravastatin 40 MG- Last filled 07-06-2020 90 [facility] [place] Pharmacist Assistant [phone]

## 2020-07-26 DIAGNOSIS — G4733 Obstructive sleep apnea (adult) (pediatric): Secondary | ICD-10-CM | POA: Diagnosis not present

## 2020-07-29 ENCOUNTER — Encounter: Payer: Self-pay | Admitting: Internal Medicine

## 2020-07-29 ENCOUNTER — Ambulatory Visit (INDEPENDENT_AMBULATORY_CARE_PROVIDER_SITE_OTHER): Payer: Medicare Other | Admitting: Internal Medicine

## 2020-07-29 ENCOUNTER — Other Ambulatory Visit: Payer: Self-pay

## 2020-07-29 VITALS — BP 120/66 | HR 100 | Temp 98.7°F | Ht 65.0 in | Wt 251.0 lb

## 2020-07-29 DIAGNOSIS — M79662 Pain in left lower leg: Secondary | ICD-10-CM | POA: Diagnosis not present

## 2020-07-29 DIAGNOSIS — M25562 Pain in left knee: Secondary | ICD-10-CM

## 2020-07-29 NOTE — Progress Notes (Signed)
I,Katawbba Wiggins,acting as a Education administrator for [name], MD.,have documented all relevant documentation on the behalf of [name], MD,as directed by  [name], MD while in the presence of [name], MD.  This visit occurred during the SARS-CoV-2 public health emergency.  Safety protocols were in place, including screening questions prior to the visit, additional usage of staff PPE, and extensive cleaning of exam room while observing appropriate contact time as indicated for disinfecting solutions.  Subjective:     Patient ID: Michelle Hicks , female    DOB: 05-Feb-1951 , 70 y.o.   MRN: [id number]   Chief Complaint  Patient presents with   Knee Pain    left    HPI  The patient is here today for evaluation of left knee pain that started a week ago. Denies fall/trauma. Described as dull,throbbing. She also admits to left calf pain. Some discomfort with ambulation. She denies LLE swelling.   Knee Pain  The incident occurred 5 to 7 days ago. The incident occurred at home. There was no injury mechanism. The pain is present in the left knee. The quality of the pain is described as aching. The pain is at a severity of 6/10. The pain is moderate. The pain has been Intermittent since onset. Pertinent negatives include no muscle weakness, numbness or tingling. The symptoms are aggravated by movement.    Past Medical History:  Diagnosis Date   Depression    DM (diabetes mellitus) ([place])    HTN (hypertension)      Family History  Problem Relation Age of Onset   Diabetes Mother    Pancreatic cancer Mother    Hypertension Father    Heart attack Father      Current Outpatient Medications:    aspirin EC 81 MG tablet, Take 81 mg by mouth daily. Some times, Disp: , Rfl:    levothyroxine (SYNTHROID) 50 MCG tablet, Take 50 mcg by mouth daily. , Disp: , Rfl:    meloxicam (MOBIC) 15 MG tablet, TAKE 1 TABLET BY MOUTH  DAILY AS NEEDED, Disp: 90 tablet, Rfl: 3   Multiple Vitamin  (MULTIVITAMIN) tablet, Take 1 tablet by mouth daily., Disp: , Rfl:    omeprazole (PRILOSEC) 40 MG capsule, TAKE 1 CAPSULE BY MOUTH  DAILY BEFORE A MEAL, Disp: 90 capsule, Rfl: 2   ONETOUCH ULTRA test strip, USE AS DIRECTED TO CHECK  BLOOD SUGAR TWICE DAILY, Disp: 200 strip, Rfl: 3   pravastatin (PRAVACHOL) 40 MG tablet, Take 1 tablet (40 mg total) by mouth daily., Disp: 90 tablet, Rfl: 2   Semaglutide, 1 MG/DOSE, (OZEMPIC, 1 MG/DOSE,) 2 MG/1.5ML SOPN, Inject 1 mg into the skin once a week., Disp: 6 pen, Rfl: 1   telmisartan (MICARDIS) 80 MG tablet, TAKE 1 TABLET BY MOUTH  DAILY, Disp: 90 tablet, Rfl: 3   traZODone (DESYREL) 50 MG tablet, Take 1 tablet (50 mg total) by mouth at bedtime as needed for sleep., Disp: 30 tablet, Rfl: 2   Cholecalciferol (VITAMIN D-3 PO), Take 2,000 Units by mouth daily. , Disp: , Rfl:    docusate sodium (COLACE) 100 MG capsule, Take 200 mg by mouth daily as needed. , Disp: , Rfl:    hydrochlorothiazide (HYDRODIURIL) 25 MG tablet, Take 1 tablet (25 mg total) by mouth daily., Disp: 90 tablet, Rfl: 3   Lancets Misc. (ONE TOUCH SURESOFT) MISC, Use as directed to check blood sugars 2 times per day dx e11.65, Disp: 300 each, Rfl: 2  Allergies  Allergen Reactions   Sulfa Antibiotics      Review of Systems  Constitutional: Negative.   Respiratory: Negative.    Cardiovascular: Negative.   Gastrointestinal: Negative.   Musculoskeletal:        She c/o left knee pain. Pain with ambulation. Denies fall/trauma.   Neurological:  Negative for tingling and numbness.  Psychiatric/Behavioral: Negative.    All other systems reviewed and are negative.   Today's Vitals   07/29/20 1408  BP: 120/66  Pulse: 100  Temp: 98.7 F (37.1 C)  TempSrc: Oral  Weight: 251 lb (113.9 kg)  Height: 5\' 5"  (1.651 m)  PainSc: 4   PainLoc: Knee   Body mass index is 41.77 kg/m.  Wt Readings from Last 3 Encounters:  07/29/20 251 lb (113.9 kg)  07/06/20 253 lb (114.8 kg)  04/26/20 247 lb  (112 kg)    BP Readings from Last 3 Encounters:  07/29/20 120/66  07/06/20 124/72  04/26/20 (!) 142/80    Objective:  Physical Exam Vitals and nursing note reviewed.  Constitutional:      Appearance: Normal appearance. She is obese.  HENT:     Head: Normocephalic and atraumatic.     Nose:     Comments: Masked     Mouth/Throat:     Comments: Masked  Cardiovascular:     Rate and Rhythm: Normal rate and regular rhythm.     Heart sounds: Normal heart sounds.  Pulmonary:     Effort: Pulmonary effort is normal.     Breath sounds: Normal breath sounds.  Musculoskeletal:        General: Tenderness present. No swelling.     Comments: Both calves measure 14cm b/l  Skin:    General: Skin is warm.  Neurological:     General: No focal deficit present.     Mental Status: She is alert.  Psychiatric:        Mood and Affect: Mood normal.        Behavior: Behavior normal.        Assessment And Plan:     1. Left knee pain, unspecified chronicity Comments: She agrees to f/u with Ortho for radiographic studies and further evaluation.   2. Pain of left calf Comments: I will refer her for LLE venous doppler to r/o DVT.  - VAS [place] LOWER EXTREMITY VENOUS (DVT); Future    Patient was given opportunity to ask questions. Patient verbalized understanding of the plan and was able to repeat key elements of the plan. All questions were answered to their satisfaction.   I, [name], MD, have reviewed all documentation for this visit. The documentation on 08/01/20 for the exam, diagnosis, procedures, and orders are all accurate and complete.   IF YOU HAVE BEEN REFERRED TO A SPECIALIST, IT MAY TAKE 1-2 WEEKS TO SCHEDULE/PROCESS THE REFERRAL. IF YOU HAVE NOT HEARD FROM US/SPECIALIST IN TWO WEEKS, PLEASE GIVE [place] A CALL AT [phone].   THE PATIENT IS ENCOURAGED TO PRACTICE SOCIAL DISTANCING DUE TO THE COVID-19 PANDEMIC.

## 2020-07-30 ENCOUNTER — Ambulatory Visit (HOSPITAL_COMMUNITY)
Admission: RE | Admit: 2020-07-30 | Discharge: 2020-07-30 | Disposition: A | Discharge status: Home / Self Care | Payer: Medicare Other | Source: Ambulatory Visit | Attending: Internal Medicine | Admitting: Internal Medicine

## 2020-07-30 DIAGNOSIS — M79662 Pain in left lower leg: Secondary | ICD-10-CM | POA: Diagnosis not present

## 2020-08-04 DIAGNOSIS — M1712 Unilateral primary osteoarthritis, left knee: Secondary | ICD-10-CM | POA: Diagnosis not present

## 2020-08-17 ENCOUNTER — Encounter: Payer: Self-pay | Admitting: Internal Medicine

## 2020-08-17 ENCOUNTER — Telehealth: Payer: Self-pay

## 2020-08-17 NOTE — Chronic Care Management (AMB) (Signed)
    No answer, left message of telephone appointment with Orlando Penner CPP on 08-18-2020 at 11:45. Left message to have all medications, supplements, blood pressure and/or blood sugar logs available during appointment and to return call if need to reschedule.   [place] Pharmacist Assistant [phone]

## 2020-08-18 ENCOUNTER — Ambulatory Visit (INDEPENDENT_AMBULATORY_CARE_PROVIDER_SITE_OTHER): Payer: Medicare Other

## 2020-08-18 DIAGNOSIS — I10 Essential (primary) hypertension: Secondary | ICD-10-CM | POA: Diagnosis not present

## 2020-08-18 DIAGNOSIS — E1165 Type 2 diabetes mellitus with hyperglycemia: Secondary | ICD-10-CM | POA: Diagnosis not present

## 2020-08-18 DIAGNOSIS — E782 Mixed hyperlipidemia: Secondary | ICD-10-CM | POA: Diagnosis not present

## 2020-08-18 NOTE — Progress Notes (Signed)
Chronic Care Management Pharmacy Note  08/25/2020 Name:  Michelle Hicks MRN:  [id number] DOB:  28-Jan-1951  Summary: Patient reports that she has been doing ok but is concerned about her glucose readings.   Recommendations/Changes made from today's visit: Recommend patient checking her BP at least three times per week and document.  Recommend patient continue to be mindful of her diet.   Plan: Patient to continue taking her medication everyday.   Subjective: Michelle Hicks is an 70 y.o. year old female who is a primary patient of Glendale Chard, MD.  The CCM team was consulted for assistance with disease management and care coordination needs.    Engaged with patient by telephone for initial visit in response to provider referral for pharmacy case management and/or care coordination services. Patient is retired as of 2020. She retired from [employer]. Her knee was hurting on June 22nd she had the injection BS went up to 395 that evening which alarmed her because her BS is never that elevated. She reports that when she is above 200 she becomes worried. She was not eating a lot and not drinking a lot of water.   Consent to Services:  The patient was given the following information about Chronic Care Management services today, agreed to services, and gave verbal consent: 1. CCM service includes personalized support from designated clinical staff supervised by the primary care provider, including individualized plan of care and coordination with other care providers 2. 24/7 contact phone numbers for assistance for urgent and routine care needs. 3. Service will only be billed when office clinical staff spend 20 minutes or more in a month to coordinate care. 4. Only one practitioner may furnish and bill the service in a calendar month. 5.The patient may stop CCM services at any time (effective at the end of the month) by phone call to the office staff. 6. The patient will be responsible  for cost sharing (co-pay) of up to 20% of the service fee (after annual deductible is met). Patient agreed to services and consent obtained.  Patient Care Team: Glendale Chard, MD as PCP - General (Internal Medicine) Mayford Knife, [facility] (Pharmacist)  Recent office visits: 07/06/2020 PCP Office Visit- no medication changes at this time   Recent consult visits: 04/26/2020 Neurology OV- severe sleep apnea (results confirmed on 06/08/2020), chronic insomnia  Hospital visits: None in previous 6 months   Objective:  Lab Results  Component Value Date   CREATININE 0.75 07/06/2020   BUN 12 07/06/2020   GFRNONAA 73 03/24/2020   GFRAA 84 03/24/2020   NA 143 07/06/2020   K 4.5 07/06/2020   CALCIUM 10.2 07/06/2020   CO2 23 07/06/2020   GLUCOSE 125 (H) 07/06/2020    Lab Results  Component Value Date/Time   HGBA1C 7.4 (H) 07/06/2020 12:14 PM   HGBA1C 7.1 (H) 03/24/2020 10:34 AM   HGBA1C 7.3 09/04/2017 12:00 AM   MICROALBUR 10 07/06/2020 12:47 PM   MICROALBUR 30 03/24/2020 10:27 AM    Last diabetic Eye exam:  Lab Results  Component Value Date/Time   HMDIABEYEEXA No Retinopathy 01/12/2020 12:00 AM    Last diabetic Foot exam: No results found for: HMDIABFOOTEX   Lab Results  Component Value Date   CHOL 168 07/06/2020   HDL 67 07/06/2020   LDLCALC 89 07/06/2020   TRIG 61 07/06/2020   CHOLHDL 2.5 07/06/2020    Hepatic Function Latest Ref Rng & Units 07/06/2020 12/03/2019 03/18/2019  Total Protein 6.0 -  8.5 g/dL 7.8 7.6 7.5  Albumin 3.8 - 4.8 g/dL 4.5 4.3 4.5  AST 0 - 40 IU/L 31 30 49(H)  ALT 0 - 32 IU/L 32 35(H) 58(H)  Alk Phosphatase 44 - 121 IU/L 90 93 73  Total Bilirubin 0.0 - 1.2 mg/dL 0.3 0.2 0.3    Lab Results  Component Value Date/Time   TSH 1.600 03/24/2020 10:34 AM   TSH 1.310 07/10/2019 10:42 AM   FREET4 1.17 06/11/2018 09:44 AM    CBC Latest Ref Rng & Units 07/06/2020 03/18/2019 09/10/2018  WBC 3.4 - 10.8 x10E3/uL 6.6 7.5 6.8  Hemoglobin 11.1 - 15.9 g/dL 11.1  10.3(L) 11.2  Hematocrit 34.0 - 46.6 % 33.5(L) 31.1(L) 34.5  Platelets 150 - 450 x10E3/uL 312 266 283    No results found for: VD25OH  Clinical ASCVD: No  The 10-year ASCVD risk score Mikey Bussing DC Jr., et al., 2013) is: 20.9%   Values used to calculate the score:     Age: 28 years     Sex: Female     Is Non-Hispanic African American: Yes     Diabetic: Yes     Tobacco smoker: No     Systolic Blood Pressure: 168 mmHg     Is BP treated: Yes     HDL Cholesterol: 67 mg/dL     Total Cholesterol: 168 mg/dL    Depression screen [facility] 2/9 03/24/2020 07/10/2019 04/15/2019  Decreased Interest 0 0 0  Down, Depressed, Hopeless 0 0 0  PHQ - 2 Score 0 0 0  Altered sleeping - 3 -  Tired, decreased energy - 0 -  Change in appetite - 0 -  Feeling bad or failure about yourself  - 0 -  Trouble concentrating - 0 -  Moving slowly or fidgety/restless - 0 -  Suicidal thoughts - 0 -  PHQ-9 Score - 3 -  Difficult doing work/chores - Not difficult at all -      Social History   Tobacco Use  Smoking Status Former   Packs/day: 0.50   Years: 15.00   Pack years: 7.50   Types: Cigarettes  Smokeless Tobacco Never  Tobacco Comments   20 years ago   BP Readings from Last 3 Encounters:  07/29/20 120/66  07/06/20 124/72  04/26/20 (!) 142/80   Pulse Readings from Last 3 Encounters:  07/29/20 100  07/06/20 90  04/26/20 77   Wt Readings from Last 3 Encounters:  07/29/20 251 lb (113.9 kg)  07/06/20 253 lb (114.8 kg)  04/26/20 247 lb (112 kg)   BMI Readings from Last 3 Encounters:  07/29/20 41.77 kg/m  07/06/20 42.10 kg/m  04/26/20 42.40 kg/m    Assessment/Interventions: Review of patient past medical history, allergies, medications, health status, including review of consultants reports, laboratory and other test data, was performed as part of comprehensive evaluation and provision of chronic care management services.   SDOH:  (Social Determinants of Health) assessments and interventions  performed: Yes  SDOH Screenings   Alcohol Screen: Not on file  Depression (PHQ2-9): Low Risk    PHQ-2 Score: 0  Financial Resource Strain: Low Risk    Difficulty of Paying Living Expenses: Not hard at all  Food Insecurity: No Food Insecurity   Worried About Charity fundraiser in the Last Year: Never true   Ran Out of Food in the Last Year: Never true  Housing: Not on file  Physical Activity: Insufficiently Active   Days of Exercise per Week: 5 days  Minutes of Exercise per Session: 20 min  Social Connections: Not on file  Stress: No Stress Concern Present   Feeling of Stress : Not at all  Tobacco Use: Medium Risk   Smoking Tobacco Use: Former   Smokeless Tobacco Use: Never  Transportation Needs: No Transportation Needs   Lack of Transportation (Medical): No   Lack of Transportation (Non-Medical): No    CCM Care Plan  Allergies  Allergen Reactions   Sulfa Antibiotics     Medications Reviewed Today     Reviewed by Lynne Logan, RN (Registered Nurse) on 08/24/20 at 1313  Med List Status: <None>   Medication Order Taking? Sig Documenting Provider Last Dose Status Informant  aspirin EC 81 MG tablet [id number] No Take 81 mg by mouth daily. Some times [provider] Taking Active Self  Cholecalciferol (VITAMIN D-3 PO) [id number] No Take 2,000 Units by mouth daily.  [provider] Taking Active   docusate sodium (COLACE) 100 MG capsule [id number] No Take 200 mg by mouth daily as needed.  [provider] Taking Active   hydrochlorothiazide (HYDRODIURIL) 25 MG tablet [id number]  Take 1 tablet (25 mg total) by mouth daily. Glendale Chard, MD  Active   Lancets Misc. (ONE [place] SURESOFT) [place] [id number] No Use as directed to check blood sugars 2 times per day dx e11.65 Glendale Chard, MD Taking Active   levothyroxine (SYNTHROID) 50 MCG tablet [id number] No Take 50 mcg by mouth daily.  [provider] Taking Active   meloxicam (MOBIC) 15 MG tablet  [id number] No TAKE 1 TABLET BY MOUTH  DAILY AS NEEDED Glendale Chard, MD Taking Active   Multiple Vitamin (MULTIVITAMIN) tablet [id number] No Take 1 tablet by mouth daily. [provider] Taking Active   omeprazole (PRILOSEC) 40 MG capsule [id number] No TAKE 1 CAPSULE BY MOUTH  DAILY BEFORE A MEAL Glendale Chard, MD Taking Active   [facility] ULTRA test strip [id number] No USE AS DIRECTED TO CHECK  BLOOD SUGAR TWICE DAILY Glendale Chard, MD Taking Active   pravastatin (PRAVACHOL) 40 MG tablet [id number] No Take 1 tablet (40 mg total) by mouth daily. Glendale Chard, MD Taking Active   Semaglutide, 1 MG/DOSE, (OZEMPIC, 1 MG/DOSE,) 2 MG/1.5ML SOPN [id number] No Inject 1 mg into the skin once a week. Glendale Chard, MD Taking Active   telmisartan (MICARDIS) 80 MG tablet [id number] No TAKE 1 TABLET BY MOUTH  DAILY Glendale Chard, MD Taking Active   traZODone (DESYREL) 50 MG tablet [id number]  Take 1 tablet (50 mg total) by mouth at bedtime as needed for sleep. Dohmeier, Asencion Partridge, MD  Active             Patient Active Problem List   Diagnosis Date Noted   Chronic insomnia 04/26/2020   Fatigue due to depression 04/26/2020   Snoring 04/26/2020   Class 3 severe obesity due to excess calories with serious comorbidity and body mass index (BMI) of 40.0 to 44.9 in adult [facility]) 03/18/2019   Essential hypertension, benign 11/13/2017   Gastroesophageal reflux disease without esophagitis 11/13/2017   Hypertension 09/04/2017   Type II diabetes mellitus, uncontrolled ([place]) 09/04/2017    Immunization History  Administered Date(s) Administered   DTaP 02/28/2012   Fluad Quad(high Dose 65+) 11/07/2019   Influenza, High Dose Seasonal PF 11/14/2017, 10/30/2018   Influenza-Unspecified 11/14/2017, 10/30/2018   Moderna Sars-Covid-2 Vaccination 03/28/2019, 04/25/2019, 02/03/2020, 07/04/2020   Pneumococcal Conjugate-13 04/12/2018   Pneumococcal-Unspecified 08/06/2013   Zoster Recombinat (Shingrix) 11/14/2018,  07/29/2019    Conditions  to be addressed/monitored:  Hypertension, Hyperlipidemia, and Diabetes  Care Plan : [place]  Updates made by Mayford Knife, RPH since 08/25/2020 12:00 AM     Problem: HTN, HLD, DM II   Priority: High     Long-Range Goal: Disease Management   This Visit's Progress: On track  Priority: High  Note:    Current Barriers:  Unable to independently monitor therapeutic efficacy  Pharmacist Clinical Goal(s):  Patient will achieve adherence to monitoring guidelines and medication adherence to achieve therapeutic efficacy through collaboration with PharmD and provider.   Interventions: 1:1 collaboration with Glendale Chard, MD regarding development and update of comprehensive plan of care as evidenced by provider attestation and co-signature Inter-disciplinary care team collaboration (see longitudinal plan of care) Comprehensive medication review performed; medication list updated in electronic medical record  Hypertension (BP goal <130/80) -Controlled -Current treatment: Hydrochlorothiazide 25 taking 1 tablet daily Telmisartan 80 mg take 1 tablet by mouth daily -Medications previously tried: Amlodipine 2.5 mg, Losartan - Hydrochlorothiazide 100-12.5 mg,  -Current home readings: 109/66  -Current dietary habits: please see diabetes  -Current exercise habits: please see diabetes  -Denies hypotensive/hypertensive symptoms -Educated on Importance of home blood pressure monitoring; Proper BP monitoring technique; -Counseled to monitor BP at home at least three times per week, document, and provide log at future appointments -Counseled on diet and exercise extensively Recommended to continue current medication  Hyperlipidemia: (LDL goal < 70) -Uncontrolled -Current treatment: Pravastatin 40 mg tablet once per day  Patient was previously taking 20 mg tablet daily  -Current dietary patterns: she is eating salads including green mix with  carrot, tomatoes. She has been sauteeing her broccoli with onions and garlic.  -Current exercise habits: patient is using a pedometer  on her wrist to calculate her walking. She is walking continuously for 20 minutes, is getting between 5000-11000 steps per day. Her goal 7500 steps per day. -Educated on Cholesterol goals;  Importance of limiting foods high in cholesterol; Exercise goal of 150 minutes per week; -Counseled on diet and exercise extensively Recommended to continue current medication  Diabetes (A1c goal <7%) -Uncontrolled -Current medications: Ozempic 1 mg inject into the skin once a week -Medications previously tried: Kombiglyze XR 2.5 -1000 mg tablet  -Current home glucose readings fasting glucose: 7/1-153, 7/3-137, 7/4-122, 8:14 AM 124, 113,  post prandial glucose: 6:23 PM- 103 -Denies hypoglycemic/hyperglycemic symptoms -Current meal patterns: patient is using a food tracker breakfast: lemon juice with water and honey, two pieces of toast - tbsp of peanut butter, and toast-cheese, or tuna salad with 10 ritz crackers and a cup of blueberries lunch: deviled egg sandwich, sometimes with blueberries  or frozen fish sticks, with home made tartar sauce  dinner: club sandwich-strip of bacon, 4 ounces of ham, salami, bologna, mustard pepper jack cheese and carrots snacks: small firm apples, salted dry roasted peanuts, 2 cups of popcorn - store brand kernels and popped at home   drinks: water - drinking 4 bottles of 16.9 ounces per day, crystal light lemonade -Current exercise: she is working on walking more  -Educated on A1c and blood sugar goals; Exercise goal of 150 minutes per week; Prevention and management of hypoglycemic episodes; -Counseled to check feet daily and get yearly eye exams -Recommended to continue current medication   Patient Goals/Self-Care Activities Patient will:  - take medications as prescribed  Follow Up Plan: The patient has been provided with  contact information for the care management team and has been advised to  call with any health related questions or concerns.        Medication Assistance:  Ozempic obtained through [place] nordisk medication assistance program.  Enrollment ends 01/2021  Compliance/Adherence/Medication fill history: Care Gaps: Pneumonia Vaccine  Star-Rating Drugs: Pravastatin 40 mg  Ozempic 1 mg  Telmisartan 80 mg   Patient's preferred pharmacy is:  [facility] - [place], [place] [address] Phone: [phone] Fax: [phone]  Uses pill box? Yes - using a Sunday with Saturday for morning, noon, evening and bedtime.  Pt endorses 95% compliance  We discussed: Current pharmacy is preferred with insurance plan and patient is satisfied with pharmacy services Patient decided to: Continue current medication management strategy  Care Plan and Follow Up Patient Decision:  Patient agrees to Care Plan and Follow-up.  Plan: The patient has been provided with contact information for the care management team and has been advised to call with any health related questions or concerns.   Orlando Penner, PharmD Clinical Pharmacist [facility] [phone]

## 2020-08-22 ENCOUNTER — Other Ambulatory Visit: Payer: Self-pay | Admitting: Neurology

## 2020-08-23 NOTE — Telephone Encounter (Signed)
Called and spoke to pt to verify dosage. Per pt she started taking 1/2 tab with no help so she increased it to 1 whole tab nightly and per pt has helped. Based on DR. Dohmeier note on 3/14 she "ordered  trazodone 50 mg tab, 1/2 tab prn at bedtime." But was prescribed to take 1 whole tablet.   Pt would like to continue to take 1 whole tablet. Refill sent to [facility].

## 2020-08-24 ENCOUNTER — Telehealth: Payer: Medicare Other

## 2020-08-24 ENCOUNTER — Ambulatory Visit: Payer: Self-pay

## 2020-08-24 DIAGNOSIS — R195 Other fecal abnormalities: Secondary | ICD-10-CM | POA: Diagnosis not present

## 2020-08-24 DIAGNOSIS — I10 Essential (primary) hypertension: Secondary | ICD-10-CM

## 2020-08-24 DIAGNOSIS — E1165 Type 2 diabetes mellitus with hyperglycemia: Secondary | ICD-10-CM

## 2020-08-24 DIAGNOSIS — K5900 Constipation, unspecified: Secondary | ICD-10-CM | POA: Diagnosis not present

## 2020-08-24 DIAGNOSIS — R1310 Dysphagia, unspecified: Secondary | ICD-10-CM | POA: Diagnosis not present

## 2020-08-24 DIAGNOSIS — E782 Mixed hyperlipidemia: Secondary | ICD-10-CM

## 2020-08-24 NOTE — Progress Notes (Signed)
This encounter was created in error - please disregard.

## 2020-08-25 DIAGNOSIS — G4733 Obstructive sleep apnea (adult) (pediatric): Secondary | ICD-10-CM | POA: Diagnosis not present

## 2020-08-25 NOTE — Patient Instructions (Signed)
Visit Information It was great speaking with you today!  Please let me know if you have any questions about our visit.   Goals Addressed             This Visit's Progress    Manage My Medicine       Timeframe:  Long-Range Goal Priority:  High Start Date:                             Expected End Date:                       Follow Up Date 12/15/2020    - call for medicine refill 2 or 3 days before it runs out - call if I am sick and can't take my medicine - keep a list of all the medicines I take; vitamins and herbals too - use an alarm clock or phone to remind me to take my medicine    Why is this important?   These steps will help you keep on track with your medicines.   Notes:  Please call if you have any questions          Patient Care Plan: CCM Pharmacy Care Plan     Problem Identified: HTN, HLD, DM II   Priority: High     Long-Range Goal: Disease Management   This Visit's Progress: On track  Priority: High  Note:    Current Barriers:  Unable to independently monitor therapeutic efficacy  Pharmacist Clinical Goal(s):  Patient will achieve adherence to monitoring guidelines and medication adherence to achieve therapeutic efficacy through collaboration with PharmD and provider.   Interventions: 1:1 collaboration with Glendale Chard, MD regarding development and update of comprehensive plan of care as evidenced by provider attestation and co-signature Inter-disciplinary care team collaboration (see longitudinal plan of care) Comprehensive medication review performed; medication list updated in electronic medical record  Hypertension (BP goal <130/80) -Controlled -Current treatment: Hydrochlorothiazide 25 taking 1 tablet daily Telmisartan 80 mg take 1 tablet by mouth daily -Medications previously tried: Amlodipine 2.5 mg, Losartan - Hydrochlorothiazide 100-12.5 mg,  -Current home readings: 109/66  -Current dietary habits: please see diabetes  -Current  exercise habits: please see diabetes  -Denies hypotensive/hypertensive symptoms -Educated on Importance of home blood pressure monitoring; Proper BP monitoring technique; -Counseled to monitor BP at home at least three times per week, document, and provide log at future appointments -Counseled on diet and exercise extensively Recommended to continue current medication  Hyperlipidemia: (LDL goal < 70) -Uncontrolled -Current treatment: Pravastatin 40 mg tablet once per day  Patient was previously taking 20 mg tablet daily  -Current dietary patterns: she is eating salads including green mix with carrot, tomatoes. She has been sauteeing her broccoli with onions and garlic.  -Current exercise habits: patient is using a pedometer  on her wrist to calculate her walking. She is walking continuously for 20 minutes, is getting between 5000-11000 steps per day. Her goal 7500 steps per day. -Educated on Cholesterol goals;  Importance of limiting foods high in cholesterol; Exercise goal of 150 minutes per week; -Counseled on diet and exercise extensively Recommended to continue current medication  Diabetes (A1c goal <7%) -Uncontrolled -Current medications: Ozempic 1 mg inject into the skin once a week -Medications previously tried: Kombiglyze XR 2.5 -1000 mg tablet  -Current home glucose readings fasting glucose: 7/1-153, 7/3-137, 7/4-122, 8:14 AM 124, 113,  post prandial glucose: 6:23 PM-  103 -Denies hypoglycemic/hyperglycemic symptoms -Current meal patterns: patient is using a food tracker breakfast: lemon juice with water and honey, two pieces of toast - tbsp of peanut butter, and toast-cheese, or tuna salad with 10 ritz crackers and a cup of blueberries lunch: deviled egg sandwich, sometimes with blueberries  or frozen fish sticks, with home made tartar sauce  dinner: club sandwich-strip of bacon, 4 ounces of ham, salami, bologna, mustard pepper jack cheese and carrots snacks: small firm  apples, salted dry roasted peanuts, 2 cups of popcorn - store brand kernels and popped at home   drinks: water - drinking 4 bottles of 16.9 ounces per day, crystal light lemonade -Current exercise: she is working on walking more  -Educated on A1c and blood sugar goals; Exercise goal of 150 minutes per week; Prevention and management of hypoglycemic episodes; -Counseled to check feet daily and get yearly eye exams -Recommended to continue current medication   Patient Goals/Self-Care Activities Patient will:  - take medications as prescribed  Follow Up Plan: The patient has been provided with contact information for the care management team and has been advised to call with any health related questions or concerns.        Michelle Hicks was given information about Chronic Care Management services today including:  CCM service includes personalized support from designated clinical staff supervised by her physician, including individualized plan of care and coordination with other care providers 24/7 contact phone numbers for assistance for urgent and routine care needs. Standard insurance, coinsurance, copays and deductibles apply for chronic care management only during months in which we provide at least 20 minutes of these services. Most insurances cover these services at 100%, however patients may be responsible for any copay, coinsurance and/or deductible if applicable. This service may help you avoid the need for more expensive face-to-face services. Only one practitioner may furnish and bill the service in a calendar month. The patient may stop CCM services at any time (effective at the end of the month) by phone call to the office staff.  Patient agreed to services and verbal consent obtained.   The patient verbalized understanding of instructions, educational materials, and care plan provided today and agreed to receive a mailed copy of patient instructions, educational materials, and  care plan.   Orlando Penner, PharmD Clinical Pharmacist [facility] [phone]

## 2020-08-31 NOTE — Patient Instructions (Signed)
Goals Addressed       Other     Monitor and Manage My Blood Sugar-Diabetes Type 2   On track     Timeframe:  Long-Range Goal Priority:  High Start Date:  08/24/20                           Expected End Date: 08/24/21                      Follow Up Date 10/08/20    - check blood sugar at prescribed times - check blood sugar before and after exercise - check blood sugar if I feel it is too high or too low - enter blood sugar readings and medication or insulin into daily log - take the blood sugar log to all doctor visits - take the blood sugar meter to all doctor visits    Why is this important?   Checking your blood sugar at home helps to keep it from getting very high or very low.  Writing the results in a diary or log helps the doctor know how to care for you.  Your blood sugar log should have the time, date and the results.  Also, write down the amount of insulin or other medicine that you take.  Other information, like what you ate, exercise done and how you were feeling, will also be helpful.     Notes:       Obtain Eye Exam-Diabetes Type 2   On track     Timeframe:  Long-Range Goal Priority:  Medium Start Date:  08/24/20                           Expected End Date: 08/24/21                       Follow Up Date 10/08/20    - keep appointment with eye doctor - schedule appointment with eye doctor    Why is this important?   Eye check-ups are important when you have diabetes.  Vision loss can be prevented.    Notes:       Perform Foot Care-Diabetes Type 2   On track     Timeframe:  Long-Range Goal Priority:  Medium Start Date:  08/24/20                           Expected End Date:  08/24/21                     Follow Up Date 10/08/20    - check feet daily for cuts, sores or redness - do heel pump exercise 2 to 3 times each day - keep feet up while sitting - trim toenails straight across - wash and dry feet carefully every day - wear comfortable, cotton socks -  wear comfortable, well-fitting shoes    Why is this important?   Good foot care is very important when you have diabetes.  There are many things you can do to keep your feet healthy and catch a problem early.    Notes:       Track and Manage My Blood Pressure-Hypertension   On track     Timeframe:  Long-Range Goal Priority:  Medium Start Date: 08/24/20  Expected End Date: 08/24/21                      Follow Up Date 10/08/20    - check blood pressure 3 times per week - write blood pressure results in a log or diary    Why is this important?   You won't feel high blood pressure, but it can still hurt your blood vessels.  High blood pressure can cause heart or kidney problems. It can also cause a stroke.  Making lifestyle changes like losing a [name] weight or eating less salt will help.  Checking your blood pressure at home and at different times of the day can help to control blood pressure.  If the doctor prescribes medicine remember to take it the way the doctor ordered.  Call the office if you cannot afford the medicine or if there are questions about it.     Notes:

## 2020-08-31 NOTE — Chronic Care Management (AMB) (Signed)
Chronic Care Management   CCM RN Visit Note  08/24/2020 Name: Michelle Hicks MRN: [id number] DOB: 1950-08-18  Subjective: Michelle Hicks is a 70 y.o. year old female who is a primary care patient of Glendale Chard, MD. The care management team was consulted for assistance with disease management and care coordination needs.    Engaged with patient by telephone for follow up visit in response to provider referral for case management and/or care coordination services.   Consent to Services:  The patient was given information about Chronic Care Management services, agreed to services, and gave verbal consent prior to initiation of services.  Please see initial visit note for detailed documentation.   Patient agreed to services and verbal consent obtained.   Assessment: Review of patient past medical history, allergies, medications, health status, including review of consultants reports, laboratory and other test data, was performed as part of comprehensive evaluation and provision of chronic care management services.   SDOH (Social Determinants of Health) assessments and interventions performed:    CCM Care Plan  Allergies  Allergen Reactions   Sulfa Antibiotics     Outpatient Encounter Medications as of 08/24/2020  Medication Sig   aspirin EC 81 MG tablet Take 81 mg by mouth daily. Some times   Cholecalciferol (VITAMIN D-3 PO) Take 2,000 Units by mouth daily.    docusate sodium (COLACE) 100 MG capsule Take 200 mg by mouth daily as needed.    hydrochlorothiazide (HYDRODIURIL) 25 MG tablet Take 1 tablet (25 mg total) by mouth daily.   Lancets Misc. (ONE TOUCH SURESOFT) MISC Use as directed to check blood sugars 2 times per day dx e11.65   levothyroxine (SYNTHROID) 50 MCG tablet Take 50 mcg by mouth daily.    meloxicam (MOBIC) 15 MG tablet TAKE 1 TABLET BY MOUTH  DAILY AS NEEDED   Multiple Vitamin (MULTIVITAMIN) tablet Take 1 tablet by mouth daily.   omeprazole (PRILOSEC) 40 MG  capsule TAKE 1 CAPSULE BY MOUTH  DAILY BEFORE A MEAL   ONETOUCH ULTRA test strip USE AS DIRECTED TO CHECK  BLOOD SUGAR TWICE DAILY   pravastatin (PRAVACHOL) 40 MG tablet Take 1 tablet (40 mg total) by mouth daily.   Semaglutide, 1 MG/DOSE, (OZEMPIC, 1 MG/DOSE,) 2 MG/1.5ML SOPN Inject 1 mg into the skin once a week.   telmisartan (MICARDIS) 80 MG tablet TAKE 1 TABLET BY MOUTH  DAILY   traZODone (DESYREL) 50 MG tablet Take 1 tablet (50 mg total) by mouth at bedtime as needed for sleep.   No facility-administered encounter medications on file as of 08/24/2020.    Patient Active Problem List   Diagnosis Date Noted   Chronic insomnia 04/26/2020   Fatigue due to depression 04/26/2020   Snoring 04/26/2020   Class 3 severe obesity due to excess calories with serious comorbidity and body mass index (BMI) of 40.0 to 44.9 in adult ([place]) 03/18/2019   Essential hypertension, benign 11/13/2017   Gastroesophageal reflux disease without esophagitis 11/13/2017   Hypertension 09/04/2017   Type II diabetes mellitus, uncontrolled ([place]) 09/04/2017    Conditions to be addressed/monitored: HTN, DM II, HLD  Care Plan : Diabetes Type 2 (Adult)  Updates made by Lynne Logan, RN since 08/31/2020 12:00 AM     Problem: Glycemic Management (Diabetes, Type 2)   Priority: High     Long-Range Goal: Glycemic Management Optimized   Start Date: 08/24/2020  Expected End Date: 08/24/2021  This Visit's Progress: On track  Priority: High  Note:  Objective:  Lab Results  Component Value Date   HGBA1C 7.4 (H) 07/06/2020   Lab Results  Component Value Date   CREATININE 0.75 07/06/2020   CREATININE 0.83 03/24/2020   CREATININE 0.71 12/03/2019   Lab Results  Component Value Date   EGFR 87 07/06/2020   Current Barriers:  Knowledge Deficits related to basic Diabetes pathophysiology and self care/management Knowledge Deficits related to medications used for management of diabetes Case Manager Clinical  Goal(s):  patient will demonstrate improved adherence to prescribed treatment plan for diabetes self care/management as evidenced by: daily monitoring and recording of CBG  adherence to ADA/ carb modified diet exercise 5 days/week adherence to prescribed medication regimen contacting provider for new or worsened symptoms or questions Interventions:  08/24/20 completed successful outbound call with patient  Collaboration with Glendale Chard, MD regarding development and update of comprehensive plan of care as evidenced by provider attestation and co-signature Inter-disciplinary care team collaboration (see longitudinal plan of care) Provided education to patient about basic DM disease process Review of patient status, including review of consultant's reports, relevant laboratory and other test results, and medications completed. Reviewed medications with patient and discussed importance of medication adherence Educated patient on dietary and exercise recommendations; daily glycemic control FBS 80-130, <180 after meals;15'15' rule Advised patient, providing education and rationale, to check cbg daily before meals and at bedtime and record, calling the CCM team and or PCP for findings outside established parameters Reviewed scheduled/upcoming provider appointments including: next PCP follow up appointment scheduled for 10/06/20 @10 :15 AM   Printed educational materials related to Diabetes Management mailed to patient's home address  Discussed plans with patient for ongoing care management follow up and provided patient with direct contact information for care management team Self-Care Activities Self administers oral medications as prescribed Self administers injectable DM medication (Ozempic) as prescribed Attends all scheduled provider appointments Checks blood sugars as prescribed and utilize hyper and hypoglycemia protocol as needed Adheres to prescribed ADA/carb modified Patient Goals: - check  blood sugar at prescribed times - check blood sugar before and after exercise - check blood sugar if I feel it is too high or too low - enter blood sugar readings and medication or insulin into daily log - take the blood sugar log to all doctor visits - take the blood sugar meter to all doctor visits - drink 6 to 8 glasses of water each day - manage portion size - keep appointment with eye doctor - schedule appointment with eye doctor - check feet daily for cuts, sores or redness - do heel pump exercise 2 to 3 times each day - keep feet up while sitting - trim toenails straight across - wash and dry feet carefully every day - wear comfortable, cotton socks - wear comfortable, well-fitting shoes Follow Up Plan: Telephone follow up appointment with care management team member scheduled for:  10/08/20     Care Plan : Hypertension (Adult)  Updates made by Lynne Logan, RN since 08/31/2020 12:00 AM     Problem: Hypertension (Hypertension)   Priority: Medium     Long-Range Goal: Hypertension Monitored   Start Date: 08/24/2020  Expected End Date: 08/24/2021  This Visit's Progress: On track  Priority: Medium  Note:   Objective:  Last practice recorded BP readings:  BP Readings from Last 3 Encounters:  07/29/20 120/66  07/06/20 124/72  04/26/20 (!) 142/80   Most recent eGFR/CrCl:  Lab Results  Component Value Date   EGFR 87 07/06/2020  No components found for: CRCL Current Barriers:  Knowledge Deficits related to basic understanding of hypertension pathophysiology and self care management Knowledge Deficits related to understanding of medications prescribed for management of hypertension Case Manager Clinical Goal(s):  patient will demonstrate improved adherence to prescribed treatment plan for hypertension as evidenced by taking all medications as prescribed, monitoring and recording blood pressure as directed, adhering to low sodium/DASH diet Interventions:  08/24/20  completed successful outbound call with patient  Collaboration with Glendale Chard, MD regarding development and update of comprehensive plan of care as evidenced by provider attestation and co-signature Inter-disciplinary care team collaboration (see longitudinal plan of care) Evaluation of current treatment plan related to hypertension self management and patient's adherence to plan as established by provider. Provided education to patient re: stroke prevention, s/s of heart attack and stroke, DASH diet, complications of uncontrolled blood pressure Reviewed medications with patient and discussed importance of compliance Advised patient, providing education and rationale, to monitor blood pressure daily and record, calling PCP for findings outside established parameters.  Discussed plans with patient for ongoing care management follow up and provided patient with direct contact information for care management team Self-Care Activities: Self administers medications as prescribed Attends all scheduled provider appointments Calls provider office for new concerns, questions, or BP outside discussed parameters Checks BP and records as discussed Follows a low sodium diet/DASH diet Patient Goals: - check blood pressure 3 times per week - write blood pressure results in a log or diary - learn about high blood pressure  Follow Up Plan: Telephone follow up appointment with care management team member scheduled for: 10/08/20     Plan:Telephone follow up appointment with care management team member scheduled for:  10/08/20  Barb Merino, RN, BSN, CCM Care Management Coordinator [facility]  Direct Phone: [phone]

## 2020-09-03 DIAGNOSIS — G4733 Obstructive sleep apnea (adult) (pediatric): Secondary | ICD-10-CM | POA: Diagnosis not present

## 2020-09-07 ENCOUNTER — Other Ambulatory Visit: Payer: Self-pay | Admitting: Internal Medicine

## 2020-09-07 DIAGNOSIS — Z1231 Encounter for screening mammogram for malignant neoplasm of breast: Secondary | ICD-10-CM

## 2020-09-09 ENCOUNTER — Telehealth: Payer: Self-pay

## 2020-09-09 NOTE — Telephone Encounter (Signed)
The pt was notified that her Ozempic has been delivered from [employer] Patient assistance and is ready for pickup.

## 2020-09-17 ENCOUNTER — Other Ambulatory Visit: Payer: Self-pay

## 2020-09-17 ENCOUNTER — Ambulatory Visit
Admission: RE | Admit: 2020-09-17 | Discharge: 2020-09-17 | Disposition: A | Discharge status: Home / Self Care | Payer: Medicare Other | Source: Ambulatory Visit

## 2020-09-17 DIAGNOSIS — Z1231 Encounter for screening mammogram for malignant neoplasm of breast: Secondary | ICD-10-CM

## 2020-09-25 DIAGNOSIS — G4733 Obstructive sleep apnea (adult) (pediatric): Secondary | ICD-10-CM | POA: Diagnosis not present

## 2020-09-28 DIAGNOSIS — S83282A Other tear of lateral meniscus, current injury, left knee, initial encounter: Secondary | ICD-10-CM | POA: Diagnosis not present

## 2020-10-04 DIAGNOSIS — M25562 Pain in left knee: Secondary | ICD-10-CM | POA: Diagnosis not present

## 2020-10-06 ENCOUNTER — Other Ambulatory Visit: Payer: Self-pay

## 2020-10-06 ENCOUNTER — Encounter: Payer: Self-pay | Admitting: Internal Medicine

## 2020-10-06 ENCOUNTER — Ambulatory Visit (INDEPENDENT_AMBULATORY_CARE_PROVIDER_SITE_OTHER): Payer: Medicare Other | Admitting: Internal Medicine

## 2020-10-06 VITALS — BP 128/78 | HR 82 | Temp 98.1°F | Ht 64.4 in | Wt 250.6 lb

## 2020-10-06 DIAGNOSIS — I10 Essential (primary) hypertension: Secondary | ICD-10-CM

## 2020-10-06 DIAGNOSIS — M2550 Pain in unspecified joint: Secondary | ICD-10-CM | POA: Diagnosis not present

## 2020-10-06 DIAGNOSIS — Z6841 Body Mass Index (BMI) 40.0 and over, adult: Secondary | ICD-10-CM

## 2020-10-06 DIAGNOSIS — E039 Hypothyroidism, unspecified: Secondary | ICD-10-CM

## 2020-10-06 DIAGNOSIS — E1165 Type 2 diabetes mellitus with hyperglycemia: Secondary | ICD-10-CM

## 2020-10-06 DIAGNOSIS — E782 Mixed hyperlipidemia: Secondary | ICD-10-CM | POA: Diagnosis not present

## 2020-10-06 NOTE — Progress Notes (Signed)
I,Tianna Badgett,acting as a Education administrator for [name], MD.,have documented all relevant documentation on the behalf of [name], MD,as directed by  [name], MD while in the presence of [name], MD.  This visit occurred during the SARS-CoV-2 public health emergency.  Safety protocols were in place, including screening questions prior to the visit, additional usage of staff PPE, and extensive cleaning of exam room while observing appropriate contact time as indicated for disinfecting solutions.  Subjective:     Patient ID: Michelle Hicks , female    DOB: [birth year] , 70 y.o.   MRN: [id number]   Chief Complaint  Patient presents with   Diabetes   Hypertension    HPI  The patient is here today for a follow-up on her diabetes and blood pressure.  She reports compliance with meds. She denies headaches, chest pain and shortness of breath.  Diabetes She presents for her follow-up diabetic visit. She has type 2 diabetes mellitus. Her disease course has been stable. There are no hypoglycemic associated symptoms. There are no diabetic associated symptoms. Pertinent negatives for diabetes include no blurred vision and no chest pain. There are no hypoglycemic complications. Symptoms are stable. Risk factors for coronary artery disease include diabetes mellitus, obesity, sedentary lifestyle and post-menopausal. She is compliant with treatment most of the time. She is following a generally healthy diet. She participates in exercise intermittently. An ACE inhibitor/angiotensin II receptor blocker is being taken. Eye exam is current.  Hypertension This is a chronic problem. The current episode started more than 1 year ago. The problem has been gradually improving since onset. The problem is controlled. Pertinent negatives include no blurred vision, chest pain or shortness of breath. The current treatment provides moderate improvement.    Past Medical History:  Diagnosis Date    Depression    DM (diabetes mellitus) ([place])    HTN (hypertension)      Family History  Problem Relation Age of Onset   Diabetes Mother    Pancreatic cancer Mother    Hypertension Father    Heart attack Father      Current Outpatient Medications:    aspirin EC 81 MG tablet, Take 81 mg by mouth daily. Some times, Disp: , Rfl:    Cholecalciferol (VITAMIN D-3 PO), Take 2,000 Units by mouth daily. , Disp: , Rfl:    docusate sodium (COLACE) 100 MG capsule, Take 200 mg by mouth daily as needed. , Disp: , Rfl:    hydrochlorothiazide (HYDRODIURIL) 25 MG tablet, Take 1 tablet (25 mg total) by mouth daily., Disp: 90 tablet, Rfl: 3   Lancets Misc. (ONE TOUCH SURESOFT) MISC, Use as directed to check blood sugars 2 times per day dx e11.65, Disp: 300 each, Rfl: 2   levothyroxine (SYNTHROID) 50 MCG tablet, Take 50 mcg by mouth daily. , Disp: , Rfl:    meloxicam (MOBIC) 15 MG tablet, TAKE 1 TABLET BY MOUTH  DAILY AS NEEDED, Disp: 90 tablet, Rfl: 3   Multiple Vitamin (MULTIVITAMIN) tablet, Take 1 tablet by mouth daily., Disp: , Rfl:    omeprazole (PRILOSEC) 40 MG capsule, TAKE 1 CAPSULE BY MOUTH  DAILY BEFORE A MEAL, Disp: 90 capsule, Rfl: 2   ONETOUCH ULTRA test strip, USE AS DIRECTED TO CHECK  BLOOD SUGAR TWICE DAILY, Disp: 200 strip, Rfl: 3   pravastatin (PRAVACHOL) 40 MG tablet, Take 1 tablet (40 mg total) by mouth daily., Disp: 90 tablet, Rfl: 2   Semaglutide, 1 MG/DOSE, (OZEMPIC, 1  MG/DOSE,) 2 MG/1.5ML SOPN, Inject 1 mg into the skin once a week., Disp: 6 pen, Rfl: 1   telmisartan (MICARDIS) 80 MG tablet, TAKE 1 TABLET BY MOUTH  DAILY, Disp: 90 tablet, Rfl: 3   traZODone (DESYREL) 50 MG tablet, Take 1 tablet (50 mg total) by mouth at bedtime as needed for sleep., Disp: 90 tablet, Rfl: 0   Allergies  Allergen Reactions   Sulfa Antibiotics      Review of Systems  Constitutional: Negative.   Eyes:  Negative for blurred vision.  Respiratory: Negative.  Negative for shortness of breath.    Cardiovascular: Negative.  Negative for chest pain.  Gastrointestinal: Negative.   Musculoskeletal:  Positive for arthralgias.       She c/o generalized arthralgias. States her joint pains are worsening. She is not sure what is contributing to her symptoms. She admits she is not exercising regularly.    Neurological: Negative.     Today's Vitals   10/06/20 1018 10/06/20 1042  BP: (!) 150/72 128/78  Pulse: 82   Temp: 98.1 F (36.7 C)   TempSrc: Oral   Weight: 250 lb 9.6 oz (113.7 kg)   Height: 5' 4.4" (1.636 m)    Body mass index is 42.48 kg/m.  Wt Readings from Last 3 Encounters:  10/06/20 250 lb 9.6 oz (113.7 kg)  07/29/20 251 lb (113.9 kg)  07/06/20 253 lb (114.8 kg)    Objective:  Physical Exam Vitals and nursing note reviewed.  Constitutional:      Appearance: Normal appearance. She is obese. She is not ill-appearing.  HENT:     Head: Normocephalic and atraumatic.     Nose:     Comments: Masked     Mouth/Throat:     Comments: Masked  Cardiovascular:     Rate and Rhythm: Normal rate and regular rhythm.     Heart sounds: Normal heart sounds.  Pulmonary:     Effort: Pulmonary effort is normal.     Breath sounds: Normal breath sounds.  Musculoskeletal:        General: Swelling present.     Cervical back: Normal range of motion.     Comments: Neg squeeze test b/l  Skin:    General: Skin is warm.  Neurological:     General: No focal deficit present.     Mental Status: She is alert.  Psychiatric:        Mood and Affect: Mood normal.        Behavior: Behavior normal.        Assessment And Plan:     1. Uncontrolled type 2 diabetes mellitus with hyperglycemia (HCC) Comments: I will check a CMP, hba1c today. Importance of dietary, exercise and medication compliance was discussed with the patient.  - BMP8+eGFR - Hemoglobin A1c  2. Essential hypertension, benign Comments: Chronic, initially uncontrolled. Repeat BP is well controlled. She is encouraged to limit  her sodium intake.   3. Arthralgia, unspecified joint Comments: I will check an arthriitis panel. Encouraged to follow an anti-inflammatory diet and to reduce her sugar intake.  - ANA, IFA (with reflex) - CYCLIC CITRUL PEPTIDE ANTIBODY, IGG/IGA - Rheumatoid factor - Sedimentation rate - Uric acid  4. Mixed hyperlipidemia Comments: LDL 89 May 2022.She is encouraged to follow heart healthy diet.   5. Primary hypothyroidism Comments: I will check a thyroid panel and adjust meds as needed.  - TSH - T4, free  6. Class 3 severe obesity due to excess calories with serious comorbidity  and body mass index (BMI) of 40.0 to 44.9 in adult [facility]) Comments: BMI 42-Encouraged to strive to lose 15 pounds to decrease cardiac risk.    Patient was given opportunity to ask questions. Patient verbalized understanding of the plan and was able to repeat key elements of the plan. All questions were answered to their satisfaction.   I, [name], MD, have reviewed all documentation for this visit. The documentation on 10/06/20 for the exam, diagnosis, procedures, and orders are all accurate and complete.   IF YOU HAVE BEEN REFERRED TO A SPECIALIST, IT MAY TAKE 1-2 WEEKS TO SCHEDULE/PROCESS THE REFERRAL. IF YOU HAVE NOT HEARD FROM US/SPECIALIST IN TWO WEEKS, PLEASE GIVE [place] A CALL AT [phone].   THE PATIENT IS ENCOURAGED TO PRACTICE SOCIAL DISTANCING DUE TO THE COVID-19 PANDEMIC.

## 2020-10-06 NOTE — Patient Instructions (Signed)
Diabetes Mellitus and Nutrition, Adult When you have diabetes, or diabetes mellitus, it is very important to have healthy eating habits because your blood sugar (glucose) levels are greatly affected by what you eat and drink. Eating healthy foods in the right amounts, at about the same times every day, can help you:  Control your blood glucose.  Lower your risk of heart disease.  Improve your blood pressure.  Reach or maintain a healthy weight. What can affect my meal plan? Every person with diabetes is different, and each person has different needs for a meal plan. Your health care provider may recommend that you work with a dietitian to make a meal plan that is best for you. Your meal plan may vary depending on factors such as:  The calories you need.  The medicines you take.  Your weight.  Your blood glucose, blood pressure, and cholesterol levels.  Your activity level.  Other health conditions you have, such as heart or kidney disease. How do carbohydrates affect me? Carbohydrates, also called carbs, affect your blood glucose level more than any other type of food. Eating carbs naturally raises the amount of glucose in your blood. Carb counting is a method for keeping track of how many carbs you eat. Counting carbs is important to keep your blood glucose at a healthy level, especially if you use insulin or take certain oral diabetes medicines. It is important to know how many carbs you can safely have in each meal. This is different for every person. Your dietitian can help you calculate how many carbs you should have at each meal and for each snack. How does alcohol affect me? Alcohol can cause a sudden decrease in blood glucose (hypoglycemia), especially if you use insulin or take certain oral diabetes medicines. Hypoglycemia can be a life-threatening condition. Symptoms of hypoglycemia, such as sleepiness, dizziness, and confusion, are similar to symptoms of having too much  alcohol.  Do not drink alcohol if: ? Your health care provider tells you not to drink. ? You are pregnant, may be pregnant, or are planning to become pregnant.  If you drink alcohol: ? Do not drink on an empty stomach. ? Limit how much you use to:  0-1 drink a day for women.  0-2 drinks a day for men. ? Be aware of how much alcohol is in your drink. In the [place] one drink equals one 12 oz bottle of beer (355 mL), one 5 oz glass of wine (148 mL), or one 1 oz glass of hard liquor (44 mL). ? Keep yourself hydrated with water, diet soda, or unsweetened iced tea.  Keep in mind that regular soda, juice, and other mixers may contain a lot of sugar and must be counted as carbs. What are tips for following this plan? Reading food labels  Start by checking the serving size on the "Nutrition Facts" label of packaged foods and drinks. The amount of calories, carbs, fats, and other nutrients listed on the label is based on one serving of the item. Many items contain more than one serving per package.  Check the total grams (g) of carbs in one serving. You can calculate the number of servings of carbs in one serving by dividing the total carbs by 15. For example, if a food has 30 g of total carbs per serving, it would be equal to 2 servings of carbs.  Check the number of grams (g) of saturated fats and trans fats in one serving. Choose foods that have   a low amount or none of these fats.  Check the number of milligrams (mg) of salt (sodium) in one serving. Most people should limit total sodium intake to less than 2,300 mg per day.  Always check the nutrition information of foods labeled as "low-fat" or "nonfat." These foods may be higher in added sugar or refined carbs and should be avoided.  Talk to your dietitian to identify your daily goals for nutrients listed on the label. Shopping  Avoid buying canned, pre-made, or processed foods. These foods tend to be high in fat, sodium, and added  sugar.  Shop around the outside edge of the grocery store. This is where you will most often find fresh fruits and vegetables, bulk grains, fresh meats, and fresh dairy. Cooking  Use low-heat cooking methods, such as baking, instead of high-heat cooking methods like deep frying.  Cook using healthy oils, such as olive, canola, or sunflower oil.  Avoid cooking with butter, cream, or high-fat meats. Meal planning  Eat meals and snacks regularly, preferably at the same times every day. Avoid going long periods of time without eating.  Eat foods that are high in fiber, such as fresh fruits, vegetables, beans, and whole grains. Talk with your dietitian about how many servings of carbs you can eat at each meal.  Eat 4-6 oz (112-168 g) of lean protein each day, such as lean meat, chicken, fish, eggs, or tofu. One ounce (oz) of lean protein is equal to: ? 1 oz (28 g) of meat, chicken, or fish. ? 1 egg. ?  cup (62 g) of tofu.  Eat some foods each day that contain healthy fats, such as avocado, nuts, seeds, and fish.   What foods should I eat? Fruits Berries. Apples. Oranges. Peaches. Apricots. Plums. Grapes. Mango. Papaya. Pomegranate. Kiwi. Cherries. Vegetables Lettuce. Spinach. Leafy greens, including kale, chard, collard greens, and mustard greens. Beets. Cauliflower. Cabbage. Broccoli. Carrots. Green beans. Tomatoes. Peppers. Onions. Cucumbers. Brussels sprouts. Grains Whole grains, such as whole-wheat or whole-grain bread, crackers, tortillas, cereal, and pasta. Unsweetened oatmeal. Quinoa. Brown or wild rice. Meats and other proteins Seafood. Poultry without skin. Lean cuts of poultry and beef. Tofu. Nuts. Seeds. Dairy Low-fat or fat-free dairy products such as milk, yogurt, and cheese. The items listed above may not be a complete list of foods and beverages you can eat. Contact a dietitian for more information. What foods should I avoid? Fruits Fruits canned with  syrup. Vegetables Canned vegetables. Frozen vegetables with butter or cream sauce. Grains Refined white flour and flour products such as bread, pasta, snack foods, and cereals. Avoid all processed foods. Meats and other proteins Fatty cuts of meat. Poultry with skin. Breaded or fried meats. Processed meat. Avoid saturated fats. Dairy Full-fat yogurt, cheese, or milk. Beverages Sweetened drinks, such as soda or iced tea. The items listed above may not be a complete list of foods and beverages you should avoid. Contact a dietitian for more information. Questions to ask a health care provider  Do I need to meet with a diabetes educator?  Do I need to meet with a dietitian?  What number can I call if I have questions?  When are the best times to check my blood glucose? Where to find more information:  American Diabetes Association: diabetes.org  Academy of Nutrition and Dietetics: www.eatright.org  National Institute of Diabetes and Digestive and Kidney Diseases: www.niddk.nih.gov  Association of Diabetes Care and Education Specialists: www.diabeteseducator.org Summary  It is important to have healthy eating   habits because your blood sugar (glucose) levels are greatly affected by what you eat and drink.  A healthy meal plan will help you control your blood glucose and maintain a healthy lifestyle.  Your health care provider may recommend that you work with a dietitian to make a meal plan that is best for you.  Keep in mind that carbohydrates (carbs) and alcohol have immediate effects on your blood glucose levels. It is important to count carbs and to use alcohol carefully. This information is not intended to replace advice given to you by your health care provider. Make sure you discuss any questions you have with your health care provider. Document Revised: 01/07/2019 Document Reviewed: 01/07/2019 Elsevier Patient Education  2021 Elsevier Inc.  

## 2020-10-07 DIAGNOSIS — M25562 Pain in left knee: Secondary | ICD-10-CM | POA: Diagnosis not present

## 2020-10-08 ENCOUNTER — Telehealth: Payer: Medicare Other

## 2020-10-11 NOTE — Progress Notes (Signed)
PATIENT: Michelle Hicks DOB: 04/10/1950  REASON FOR VISIT: follow up HISTORY FROM: patient PRIMARY NEUROLOGIST: Dr. Brett Fairy  HISTORY OF PRESENT ILLNESS: Today 10/11/20:  Michelle Hicks is a 70 year old female with a history of obstructive sleep apnea on CPAP.  She reports that she is tolerating the CPAP well.  She states that she still wakes up approximately 1 time a night to urinate.  When she does get up she typically has trouble falling back asleep and will watch television.  She also watches television before bedtime.  She has trazodone but does not use it consistently.  She is unsure of the benefit.  She returns today for an evaluation.    HISTORY (Copied from Dr.Dohmeier's note) 04/26/20: Michelle Hicks is a 70 year -old  African American female patient and is seen here upon referral  by dDr Baird Cancer on 04/26/2020 for a sleep consultation. .  Chief concern according to patient :  Presents to address insomnia concerns. Last SS 10+ yrs ago and was negative for OSA. She states that she wakes up during the night a lot. Her husband told her she snores. She has woke up trying to catch breath but doesn't happen often. Avg 5-6 hrs of broken sleep, noy restoring.  Michelle Hicks  has a past medical history of : Dysphagia due to GERD, hyperlipidemia, Hypothyroidsism, Depression, DM type 2 on Ozempic(diabetes mellitus) with Diabetic Neuropathy, Morbid Obesity ([place]), and HTN (hypertension).   The patient had the first sleep study in the year 2010 or circe , she barely slept and felt is may not be valid result.   Sleep relevant medical history: Nocturia 1-2, yes to Tonsillectomy; frequent Sinusitis with headaches . Family medical /sleep history: No other family member on CPAP with OSA, insomnia, sleep walkers.    REVIEW OF SYSTEMS: Out of a complete 14 system review of symptoms, the patient complains only of the following symptoms, and all other reviewed systems are negative.  ESS 6 FSS  15  ALLERGIES: Allergies  Allergen Reactions   Sulfa Antibiotics     HOME MEDICATIONS: Outpatient Medications Prior to Visit  Medication Sig Dispense Refill   aspirin EC 81 MG tablet Take 81 mg by mouth daily. Some times     Cholecalciferol (VITAMIN D-3 PO) Take 2,000 Units by mouth daily.      docusate sodium (COLACE) 100 MG capsule Take 200 mg by mouth daily as needed.      hydrochlorothiazide (HYDRODIURIL) 25 MG tablet Take 1 tablet (25 mg total) by mouth daily. 90 tablet 3   Lancets Misc. (ONE TOUCH SURESOFT) MISC Use as directed to check blood sugars 2 times per day dx e11.65 300 each 2   levothyroxine (SYNTHROID) 50 MCG tablet Take 50 mcg by mouth daily.      meloxicam (MOBIC) 15 MG tablet TAKE 1 TABLET BY MOUTH  DAILY AS NEEDED 90 tablet 3   Multiple Vitamin (MULTIVITAMIN) tablet Take 1 tablet by mouth daily.     omeprazole (PRILOSEC) 40 MG capsule TAKE 1 CAPSULE BY MOUTH  DAILY BEFORE A MEAL 90 capsule 2   ONETOUCH ULTRA test strip USE AS DIRECTED TO CHECK  BLOOD SUGAR TWICE DAILY 200 strip 3   pravastatin (PRAVACHOL) 40 MG tablet Take 1 tablet (40 mg total) by mouth daily. 90 tablet 2   Semaglutide, 1 MG/DOSE, (OZEMPIC, 1 MG/DOSE,) 2 MG/1.5ML SOPN Inject 1 mg into the skin once a week. 6 pen 1   telmisartan (MICARDIS) 80 MG  tablet TAKE 1 TABLET BY MOUTH  DAILY 90 tablet 3   traZODone (DESYREL) 50 MG tablet Take 1 tablet (50 mg total) by mouth at bedtime as needed for sleep. 90 tablet 0   No facility-administered medications prior to visit.    PAST MEDICAL HISTORY: Past Medical History:  Diagnosis Date   Depression    DM (diabetes mellitus) ([place])    HTN (hypertension)     PAST SURGICAL HISTORY: Past Surgical History:  Procedure Laterality Date   ABDOMINAL HYSTERECTOMY     TONSILLECTOMY Bilateral 1967    FAMILY HISTORY: Family History  Problem Relation Age of Onset   Diabetes Mother    Pancreatic cancer Mother    Hypertension Father    Heart attack Father      SOCIAL HISTORY: Social History   Socioeconomic History   Marital status: Married    Spouse name: Not on file   Number of children: Not on file   Years of education: Not on file   Highest education level: Not on file  Occupational History   Occupation: retired  Tobacco Use   Smoking status: Former    Packs/day: 0.50    Years: 15.00    Pack years: 7.50    Types: Cigarettes   Smokeless tobacco: Never   Tobacco comments:    20 years ago  Electronics engineer Use   Vaping Use: Never used  Substance and Sexual Activity   Alcohol use: Not Currently    Comment: socially    Drug use: No   Sexual activity: Yes    Partners: Male    Birth control/protection: Surgical  Other Topics Concern   Not on file  Social History Narrative   Not on file   Social Determinants of Health   Financial Resource Strain: High Risk   Difficulty of Paying Living Expenses: Very hard  Food Insecurity: No Food Insecurity   Worried About Charity fundraiser in the Last Year: Never true   Ran Out of Food in the Last Year: Never true  Transportation Needs: No Transportation Needs   Lack of Transportation (Medical): No   Lack of Transportation (Non-Medical): No  Physical Activity: Insufficiently Active   Days of Exercise per Week: 5 days   Minutes of Exercise per Session: 20 min  Stress: No Stress Concern Present   Feeling of Stress : Not at all  Social Connections: Not on file  Intimate Partner Violence: Not on file      PHYSICAL EXAM  Vitals:   10/12/20 0927  BP: (!) 158/82  Pulse: 85  Weight: 251 lb (113.9 kg)  Height: '5\' 5"'$  (1.651 m)   Body mass index is 41.77 kg/m.  Generalized: Well developed, in no acute distress  Chest: Lungs clear to auscultation bilaterally  Neurological examination  Mentation: Alert oriented to time, place, history taking. Follows all commands speech and language fluent Cranial nerve II-XII: Extraocular movements were full, visual field were full on confrontational  test Head turning and shoulder shrug  were normal and symmetric. Motor: The motor testing reveals 5 over 5 strength of all 4 extremities. Good symmetric motor tone is noted throughout.  Sensory: Sensory testing is intact to soft touch on all 4 extremities. No evidence of extinction is noted.  Gait and station: Gait is normal.    DIAGNOSTIC DATA (LABS, IMAGING, TESTING) - I reviewed patient records, labs, notes, testing and imaging myself where available.  Lab Results  Component Value Date   WBC 6.6 07/06/2020  HGB 11.1 07/06/2020   HCT 33.5 (L) 07/06/2020   MCV 82 07/06/2020   PLT 312 07/06/2020      Component Value Date/Time   NA 143 07/06/2020 1214   K 4.5 07/06/2020 1214   CL 105 07/06/2020 1214   CO2 23 07/06/2020 1214   GLUCOSE 125 (H) 07/06/2020 1214   GLUCOSE 98 10/13/2009 1956   BUN 12 07/06/2020 1214   CREATININE 0.75 07/06/2020 1214   CALCIUM 10.2 07/06/2020 1214   PROT 7.8 07/06/2020 1214   ALBUMIN 4.5 07/06/2020 1214   AST 31 07/06/2020 1214   ALT 32 07/06/2020 1214   ALKPHOS 90 07/06/2020 1214   BILITOT 0.3 07/06/2020 1214   GFRNONAA 73 03/24/2020 1034   GFRAA 84 03/24/2020 1034   Lab Results  Component Value Date   CHOL 168 07/06/2020   HDL 67 07/06/2020   LDLCALC 89 07/06/2020   TRIG 61 07/06/2020   CHOLHDL 2.5 07/06/2020   Lab Results  Component Value Date   HGBA1C 7.4 (H) 07/06/2020   No results found for: VITAMINB12 Lab Results  Component Value Date   TSH 1.600 03/24/2020      ASSESSMENT AND PLAN 70 y.o. year old female  has a past medical history of Depression, DM (diabetes mellitus) ([place]), and HTN (hypertension). here with:  1.  Obstructive sleep apnea on CPAP  -CPAP compliance excellent -Residual AHI is in normal range -Encourage patient use CPAP nightly and greater than 4 hours each night -Encouraged patient to avoid watching television if she wakes up and is unable to sleep. -Follow-up in 6 months or sooner if  needed     Ward Givens, MSN, NP-C 10/11/2020, 7:00 PM [facility] [address], [place], [place] [address] [phone]

## 2020-10-12 ENCOUNTER — Ambulatory Visit: Payer: Medicare Other | Admitting: Adult Health

## 2020-10-12 ENCOUNTER — Encounter: Payer: Self-pay | Admitting: Adult Health

## 2020-10-12 VITALS — BP 158/82 | HR 85 | Ht 65.0 in | Wt 251.0 lb

## 2020-10-12 DIAGNOSIS — Z9989 Dependence on other enabling machines and devices: Secondary | ICD-10-CM | POA: Diagnosis not present

## 2020-10-12 DIAGNOSIS — G4733 Obstructive sleep apnea (adult) (pediatric): Secondary | ICD-10-CM

## 2020-10-13 ENCOUNTER — Telehealth: Payer: Medicare Other

## 2020-10-15 LAB — RHEUMATOID FACTOR: Rheumatoid fact SerPl-aCnc: <10 IU/mL (ref <14.0)

## 2020-10-15 LAB — HEMOGLOBIN A1C
Est. average glucose Bld gHb Est-mCnc: 183 mg/dL
Hgb A1c MFr Bld: 8 % — ABNORMAL HIGH (ref 4.8–5.6)

## 2020-10-15 LAB — T4, FREE: Free T4: 1.12 ng/dL (ref 0.82–1.77)

## 2020-10-15 LAB — BMP8+EGFR
BUN/Creatinine Ratio: 15 (ref 12–28)
BUN: 11 mg/dL (ref 8–27)
CO2: 21 mmol/L (ref 20–29)
Calcium: 9.7 mg/dL (ref 8.7–10.3)
Chloride: 102 mmol/L (ref 96–106)
Creatinine, Ser: 0.73 mg/dL (ref 0.57–1.00)
Glucose: 133 mg/dL — ABNORMAL HIGH (ref 65–99)
Potassium: 3.7 mmol/L (ref 3.5–5.2)
Sodium: 140 mmol/L (ref 134–144)
eGFR: 90 mL/min/{1.73_m2} (ref >59)

## 2020-10-15 LAB — URIC ACID: Uric Acid: 6 mg/dL (ref 3.0–7.2)

## 2020-10-15 LAB — CYCLIC CITRUL PEPTIDE ANTIBODY, IGG/IGA: Cyclic Citrullin Peptide Ab: 4 units (ref 0–19)

## 2020-10-15 LAB — TSH: TSH: 1.02 u[IU]/mL (ref 0.450–4.500)

## 2020-10-15 LAB — FANA STAINING PATTERNS: Speckled Pattern: 1:160 {titer} — ABNORMAL HIGH

## 2020-10-15 LAB — SEDIMENTATION RATE: Sed Rate: 25 mm/hr (ref 0–40)

## 2020-10-15 LAB — ANTINUCLEAR ANTIBODIES, IFA: ANA Titer 1: POSITIVE — AB

## 2020-10-24 ENCOUNTER — Other Ambulatory Visit: Payer: Self-pay | Admitting: Neurology

## 2020-10-26 DIAGNOSIS — G4733 Obstructive sleep apnea (adult) (pediatric): Secondary | ICD-10-CM | POA: Diagnosis not present

## 2020-10-27 ENCOUNTER — Ambulatory Visit (INDEPENDENT_AMBULATORY_CARE_PROVIDER_SITE_OTHER): Payer: Medicare Other

## 2020-10-27 DIAGNOSIS — E782 Mixed hyperlipidemia: Secondary | ICD-10-CM

## 2020-10-27 DIAGNOSIS — I10 Essential (primary) hypertension: Secondary | ICD-10-CM

## 2020-10-27 DIAGNOSIS — E1165 Type 2 diabetes mellitus with hyperglycemia: Secondary | ICD-10-CM

## 2020-10-27 NOTE — Patient Instructions (Signed)
.  goa

## 2020-10-27 NOTE — Progress Notes (Signed)
This encounter was created in error - please disregard.

## 2020-11-01 ENCOUNTER — Other Ambulatory Visit: Payer: Self-pay | Admitting: Internal Medicine

## 2020-11-01 DIAGNOSIS — M2550 Pain in unspecified joint: Secondary | ICD-10-CM

## 2020-11-01 DIAGNOSIS — R768 Other specified abnormal immunological findings in serum: Secondary | ICD-10-CM

## 2020-11-01 NOTE — Patient Instructions (Signed)
Visit Information  PATIENT GOALS:  Goals Addressed      Auto-Immune Evaluate and treat   On track    Timeframe:  Long-Range Goal Priority:  High Start Date: 10/27/20                            Expected End Date: 10/27/21     Next Scheduled follow up: 01/03/21     Patient Goals: - follow up with Rheumatology for evalution and treatment of positive ANA                     Monitor and Manage My Blood Sugar-Diabetes Type 2   On track    Timeframe:  Long-Range Goal Priority:  High Start Date:  08/24/20                           Expected End Date: 08/24/21                      Follow Up Date: 01/03/21   - check blood sugar at prescribed times - check blood sugar before and after exercise - check blood sugar if I feel it is too high or too low - enter blood sugar readings and medication or insulin into daily log - take the blood sugar log to all doctor visits - take the blood sugar meter to all doctor visits    Why is this important?   Checking your blood sugar at home helps to keep it from getting very high or very low.  Writing the results in a diary or log helps the doctor know how to care for you.  Your blood sugar log should have the time, date and the results.  Also, write down the amount of insulin or other medicine that you take.  Other information, like what you ate, exercise done and how you were feeling, will also be helpful.     Notes:      Obtain Eye Exam-Diabetes Type 2   On track    Timeframe:  Long-Range Goal Priority:  Medium Start Date:  08/24/20                           Expected End Date: 08/24/21                       Follow Up Date: 01/03/21    - keep appointment with eye doctor - schedule appointment with eye doctor    Why is this important?   Eye check-ups are important when you have diabetes.  Vision loss can be prevented.    Notes:      Perform Foot Care-Diabetes Type 2   On track    Timeframe:  Long-Range Goal Priority:  Medium Start Date:   08/24/20                           Expected End Date:  08/24/21                     Follow Up Date: 01/03/21   - check feet daily for cuts, sores or redness - do heel pump exercise 2 to 3 times each day - keep feet up while sitting - trim toenails straight across - wash and dry feet carefully every  day - wear comfortable, cotton socks - wear comfortable, well-fitting shoes    Why is this important?   Good foot care is very important when you have diabetes.  There are many things you can do to keep your feet healthy and catch a problem early.    Notes:      Track and Manage My Blood Pressure-Hypertension   On track    Timeframe:  Long-Range Goal Priority:  Medium Start Date: 08/24/20                            Expected End Date: 08/24/21                      Follow Up Date: 01/03/21    - check blood pressure 3 times per week - write blood pressure results in a log or diary    Why is this important?   You won't feel high blood pressure, but it can still hurt your blood vessels.  High blood pressure can cause heart or kidney problems. It can also cause a stroke.  Making lifestyle changes like losing a Artina Minella weight or eating less salt will help.  Checking your blood pressure at home and at different times of the day can help to control blood pressure.  If the doctor prescribes medicine remember to take it the way the doctor ordered.  Call the office if you cannot afford the medicine or if there are questions about it.     Notes:         The patient verbalized understanding of instructions, educational materials, and care plan provided today and declined offer to receive copy of patient instructions, educational materials, and care plan.   Telephone follow up appointment with care management team member scheduled for: 01/03/21  Barb Merino, RN, BSN, CCM Care Management Coordinator [facility]  Direct Phone: [phone]

## 2020-11-01 NOTE — Chronic Care Management (AMB) (Signed)
Chronic Care Management   CCM RN Visit Note  10/27/2020 Name: Michelle Hicks MRN: [id number] DOB: [birth year]  Subjective: Michelle Hicks is a 70 y.o. year old female who is a primary care patient of Glendale Chard, MD. The care management team was consulted for assistance with disease management and care coordination needs.    Engaged with patient by telephone for follow up visit in response to provider referral for case management and/or care coordination services.   Consent to Services:  The patient was given information about Chronic Care Management services, agreed to services, and gave verbal consent prior to initiation of services.  Please see initial visit note for detailed documentation.   Patient agreed to services and verbal consent obtained.   Assessment: Review of patient past medical history, allergies, medications, health status, including review of consultants reports, laboratory and other test data, was performed as part of comprehensive evaluation and provision of chronic care management services.   SDOH (Social Determinants of Health) assessments and interventions performed:  Yes, no acute challenges   CCM Care Plan  Allergies  Allergen Reactions   Sulfa Antibiotics     Outpatient Encounter Medications as of 10/27/2020  Medication Sig   aspirin EC 81 MG tablet Take 81 mg by mouth daily. Some times   Cholecalciferol (VITAMIN D-3 PO) Take 2,000 Units by mouth daily.    docusate sodium (COLACE) 100 MG capsule Take 200 mg by mouth daily as needed.    hydrochlorothiazide (HYDRODIURIL) 25 MG tablet Take 1 tablet (25 mg total) by mouth daily.   Lancets Misc. (ONE TOUCH SURESOFT) MISC Use as directed to check blood sugars 2 times per day dx e11.65   levothyroxine (SYNTHROID) 50 MCG tablet Take 50 mcg by mouth daily.    meloxicam (MOBIC) 15 MG tablet TAKE 1 TABLET BY MOUTH  DAILY AS NEEDED   Multiple Vitamin (MULTIVITAMIN) tablet Take 1 tablet by mouth daily.    omeprazole (PRILOSEC) 40 MG capsule TAKE 1 CAPSULE BY MOUTH  DAILY BEFORE A MEAL   ONETOUCH ULTRA test strip USE AS DIRECTED TO CHECK  BLOOD SUGAR TWICE DAILY   pravastatin (PRAVACHOL) 40 MG tablet Take 1 tablet (40 mg total) by mouth daily.   Semaglutide, 1 MG/DOSE, (OZEMPIC, 1 MG/DOSE,) 2 MG/1.5ML SOPN Inject 1 mg into the skin once a week.   telmisartan (MICARDIS) 80 MG tablet TAKE 1 TABLET BY MOUTH  DAILY   traZODone (DESYREL) 50 MG tablet Take 1 tablet (50 mg total) by mouth at bedtime as needed for sleep.   No facility-administered encounter medications on file as of 10/27/2020.    Patient Active Problem List   Diagnosis Date Noted   Chronic insomnia 04/26/2020   Fatigue due to depression 04/26/2020   Snoring 04/26/2020   Class 3 severe obesity due to excess calories with serious comorbidity and body mass index (BMI) of 40.0 to 44.9 in adult ([place]) 03/18/2019   Essential hypertension, benign 11/13/2017   Gastroesophageal reflux disease without esophagitis 11/13/2017   Hypertension 09/04/2017   Type II diabetes mellitus, uncontrolled ([place]) 09/04/2017    Conditions to be addressed/monitored: HTN, DM II, HLD  Care Plan : Diabetes Type 2 (Adult)  Updates made by Lynne Logan, RN since 10/27/2020 12:00 AM     Problem: Glycemic Management (Diabetes, Type 2)   Priority: High     Long-Range Goal: Glycemic Management Optimized   Start Date: 08/24/2020  Expected End Date: 08/24/2021  Recent Progress: On track  Priority: High  Note:   Objective:  Lab Results  Component Value Date   HGBA1C 8.0 (H) 10/06/2020   Lab Results  Component Value Date   CREATININE 0.73 10/06/2020   CREATININE 0.75 07/06/2020   CREATININE 0.83 03/24/2020   Lab Results  Component Value Date   EGFR 90 10/06/2020    Current Barriers:  Knowledge Deficits related to basic Diabetes pathophysiology and self care/management Knowledge Deficits related to medications used for management of diabetes Case  Manager Clinical Goal(s):  patient will demonstrate improved adherence to prescribed treatment plan for diabetes self care/management as evidenced by: daily monitoring and recording of CBG  adherence to ADA/ carb modified diet exercise 5 days/week adherence to prescribed medication regimen contacting provider for new or worsened symptoms or questions Interventions:  10/27/20 completed successful outbound call with patient  Collaboration with Glendale Chard, MD regarding development and update of comprehensive plan of care as evidenced by provider attestation and co-signature Inter-disciplinary care team collaboration (see longitudinal plan of care) Provided education to patient about basic DM disease process Review of patient status, including review of consultant's reports, relevant laboratory and other test results, and medications completed. Reviewed medications with patient and discussed importance of medication adherence Educated patient on dietary and exercise recommendations; daily glycemic control FBS 80-130, <180 after meals;15'15' rule Advised patient, providing education and rationale, to check cbg daily before meals and at bedtime and record, calling the CCM team and or PCP for findings outside established parameters Reviewed scheduled/upcoming provider appointments including: next PCP follow up appointment scheduled for 12/30/20 @10  AM Discussed plans with patient for ongoing care management follow up and provided patient with direct contact information for care management team Self-Care Activities Self administers oral medications as prescribed Self administers injectable DM medication (Ozempic) as prescribed Attends all scheduled provider appointments Checks blood sugars as prescribed and utilize hyper and hypoglycemia protocol as needed Adheres to prescribed ADA/carb modified Patient Goals: - check blood sugar at prescribed times - check blood sugar before and after exercise -  check blood sugar if I feel it is too high or too low - enter blood sugar readings and medication or insulin into daily log - take the blood sugar log to all doctor visits - take the blood sugar meter to all doctor visits - drink 6 to 8 glasses of water each day - manage portion size - keep appointment with eye doctor - schedule appointment with eye doctor - check feet daily for cuts, sores or redness - do heel pump exercise 2 to 3 times each day - keep feet up while sitting - trim toenails straight across - wash and dry feet carefully every day - wear comfortable, cotton socks - wear comfortable, well-fitting shoes  Follow Up Plan: Telephone follow up appointment with care management team member scheduled for:  01/03/21    Care Plan : Hypertension (Adult)  Updates made by Lynne Logan, RN since 10/27/2020 12:00 AM     Problem: Hypertension (Hypertension)   Priority: Medium     Long-Range Goal: Hypertension Monitored   Start Date: 08/24/2020  Expected End Date: 08/24/2021  Recent Progress: On track  Priority: Medium  Note:   Objective:  Last practice recorded BP readings:  BP Readings from Last 3 Encounters:  10/12/20 (!) 158/82  10/06/20 128/78  07/29/20 120/66   Most recent eGFR/CrCl:  Lab Results  Component Value Date   EGFR 90 10/06/2020    No components found for: CRCL Current Barriers:  Knowledge Deficits related  to basic understanding of hypertension pathophysiology and self care management Knowledge Deficits related to understanding of medications prescribed for management of hypertension Case Manager Clinical Goal(s):  patient will demonstrate improved adherence to prescribed treatment plan for hypertension as evidenced by taking all medications as prescribed, monitoring and recording blood pressure as directed, adhering to low sodium/DASH diet Interventions:  10/27/20 completed successful outbound call with patient  Collaboration with Glendale Chard, MD  regarding development and update of comprehensive plan of care as evidenced by provider attestation and co-signature Inter-disciplinary care team collaboration (see longitudinal plan of care) Evaluation of current treatment plan related to hypertension self management and patient's adherence to plan as established by provider. Provided education to patient re: stroke prevention, s/s of heart attack and stroke, DASH diet, complications of uncontrolled blood pressure Reviewed medications with patient and discussed importance of compliance Advised patient, providing education and rationale, to monitor blood pressure daily and record, calling PCP for findings outside established parameters.  Educated patient on Self monitoring of home BP, provided instructions; Educated on target BP  Discussed plans with patient for ongoing care management follow up and provided patient with direct contact information for care management team Self-Care Activities: Self administers medications as prescribed Attends all scheduled provider appointments Calls provider office for new concerns, questions, or BP outside discussed parameters Checks BP and records as discussed Follows a low sodium diet/DASH diet Patient Goals: - check blood pressure 3 times per week - write blood pressure results in a log or diary - learn about high blood pressure  Follow Up Plan: Telephone follow up appointment with care management team member scheduled for: 01/03/21     Care Plan : Auto-Immune  Updates made by Lynne Logan, RN since 10/27/2020 12:00 AM     Problem: Auto-Immune   Priority: High     Long-Range Goal: Auto Immune - Evaluate and treat   Start Date: 10/27/2020  Expected End Date: 10/27/2021  This Visit's Progress: On track  Priority: High  Note:   Current Barriers:  Ineffective Self Health Maintenance in a patient with HTN, DM II, HLD Clinical Goal(s):  Collaboration with Glendale Chard, MD regarding  development and update of comprehensive plan of care as evidenced by provider attestation and co-signature Inter-disciplinary care team collaboration (see longitudinal plan of care) patient will work with care management team to address care coordination and chronic disease management needs related to Disease Management Educational Needs Care Coordination Medication Management and Education Medication Reconciliation Psychosocial Support   Interventions:  10/27/20 completed successful outbound call with patient  Evaluation of current treatment plan related to  positive ANA , self-management and patient's adherence to plan as established by provider. Collaboration with Glendale Chard, MD regarding development and update of comprehensive plan of care as evidenced by provider attestation       and co-signature Inter-disciplinary care team collaboration (see longitudinal plan of care) Provided education to patient about basic disease process related to positive ANA with potential auto-immune conditions indicative of  Review of patient status, including review of consultant's reports, relevant laboratory and other test results, and medications completed. Discussed Dr. Baird Cancer is recommending further evaluation, patient agrees to Rheumatology referral Collaborated with Dr. Baird Cancer indicating patient is receptive to Rheumatology referral  Discussed plans with patient for ongoing care management follow up and provided patient with direct contact information for care management team Self Care Activities:  Self administers medications as prescribed Attends all scheduled provider appointments Calls pharmacy for medication refills Calls provider  office for new concerns or questions Patient Goals: - follow up with Rheumatology for evalution and treatment of positive ANA   Follow Up Plan: Telephone follow up appointment with care management team member scheduled for: 01/03/21    Plan:Telephone follow  up appointment with care management team member scheduled for:  01/03/21  Barb Merino, RN, BSN, CCM Care Management Coordinator [facility]  Direct Phone: [phone]

## 2020-11-03 ENCOUNTER — Other Ambulatory Visit: Payer: Self-pay | Admitting: Internal Medicine

## 2020-11-08 ENCOUNTER — Encounter: Payer: Self-pay | Admitting: Internal Medicine

## 2020-11-12 DIAGNOSIS — E1165 Type 2 diabetes mellitus with hyperglycemia: Secondary | ICD-10-CM

## 2020-11-12 DIAGNOSIS — I10 Essential (primary) hypertension: Secondary | ICD-10-CM

## 2020-11-12 DIAGNOSIS — E782 Mixed hyperlipidemia: Secondary | ICD-10-CM | POA: Diagnosis not present

## 2020-11-25 DIAGNOSIS — G4733 Obstructive sleep apnea (adult) (pediatric): Secondary | ICD-10-CM | POA: Diagnosis not present

## 2020-12-04 NOTE — Progress Notes (Signed)
Office Visit Note  Patient: Michelle Hicks             Date of Birth: 31-Aug-1950           MRN: [id number]             PCP: Glendale Chard, MD Referring: Glendale Chard, MD Visit Date: 12/06/2020 Occupation: Retired, benefits advocate  Subjective:  New Patient (Initial Visit) (Abnormal labs)   History of Present Illness: Michelle Hicks is a 70 y.o. female here for joint pains and positive ANA.  She does not recall any specific timing of symptom onset but feels these increase and she retired from her work in 2020.  Joint stiffness especially around the hips and knees she notices this most when getting up from a seated position if she has been there for a long amount of time then it is more severe.  Symptoms last at least about 10 minutes which is all the longer she commonly stands with moving around the house.  She has noticed swelling around the left knee but not in her other joints.  She takes oral meloxicam daily with improvement in some chronic joint pain.  She has had ongoing left knee problems with some meniscal tears previously treated with local steroid injection multiple times over several years.  She never had any major joint fractures or joint surgeries.  Lab testing showed a positive ANA test associated with these ongoing joint pains.  She denies any new skin rashes or discoloration.  She denies any lymphadenopathy, she has chronic history of nontoxic thyroid nodules. Denies alopecia, Raynaud's, pleurisy, fevers, weight loss and has no history of blood clots.  Labs reviewed 09/2020 ANA 1:160 speckled RF neg CCP neg Uric acid 6.0 ESR 25  Activities of Daily Living:  Patient reports morning stiffness for 10 minutes.   Patient Denies nocturnal pain.  Difficulty dressing/grooming: Denies Difficulty climbing stairs: Reports Difficulty getting out of chair: Reports Difficulty using hands for taps, buttons, cutlery, and/or writing: Denies  Review of Systems  Constitutional:   Positive for fatigue.  HENT:  Negative for mouth dryness.   Eyes:  Positive for dryness.  Respiratory:  Negative for shortness of breath.   Cardiovascular:  Negative for swelling in legs/feet.  Gastrointestinal:  Positive for constipation.  Endocrine: Positive for heat intolerance.  Genitourinary:  Negative for difficulty urinating.  Musculoskeletal:  Positive for joint pain, gait problem, joint pain and morning stiffness.  Skin:  Negative for rash.  Allergic/Immunologic: Negative for susceptible to infections.  Neurological:  Positive for numbness.  Hematological:  Negative for bruising/bleeding tendency.  Psychiatric/Behavioral:  Positive for sleep disturbance.    PMFS History:  Patient Active Problem List   Diagnosis Date Noted   Attention deficit hyperactivity disorder, predominantly inattentive type 12/06/2020   History of anemia 12/06/2020   Hyperglycemia due to type 2 diabetes mellitus ([place]) 12/06/2020   Hypothyroidism 12/06/2020   Menopausal symptom 12/06/2020   Non-toxic goiter 12/06/2020   Pure hypercholesterolemia 12/06/2020   Positive ANA (antinuclear antibody) 12/06/2020   Pain in left knee 12/06/2020   Chronic insomnia 04/26/2020   Snoring 04/26/2020   Class 3 severe obesity due to excess calories with serious comorbidity and body mass index (BMI) of 40.0 to 44.9 in adult [facility]) 03/18/2019   Essential hypertension, benign 11/13/2017   Gastroesophageal reflux disease without esophagitis 11/13/2017   Hypertension 09/04/2017   Type II diabetes mellitus, uncontrolled 09/04/2017   S/P abdominal hysterectomy and left salpingo-oophorectomy 08/18/1994  Past Medical History:  Diagnosis Date   Depression    DM (diabetes mellitus) ([place])    HTN (hypertension)     Family History  Problem Relation Age of Onset   Diabetes Mother    Pancreatic cancer Mother    Hypertension Father    Heart attack Father    Past Surgical History:  Procedure Laterality Date   ABDOMINAL  HYSTERECTOMY     TONSILLECTOMY Bilateral 1967   Social History   Social History Narrative   Not on file   Immunization History  Administered Date(s) Administered   DTaP 02/28/2012   Fluad Quad(high Dose 65+) 11/07/2019   Influenza, High Dose Seasonal PF 11/14/2017, 10/30/2018   Influenza-Unspecified 11/14/2017, 10/30/2018, 11/10/2020   Moderna Sars-Covid-2 Vaccination 03/28/2019, 04/25/2019, 02/03/2020, 07/04/2020   Pneumococcal Conjugate-13 04/12/2018   Pneumococcal-Unspecified 08/06/2013   Zoster Recombinat (Shingrix) 11/14/2018, 07/29/2019     Objective: Vital Signs: BP (!) 148/72 (BP Location: Right Arm, Patient Position: Sitting, Cuff Size: Large)   Pulse 80   Resp 15   Ht 5' 4"  (1.626 m)   Wt 253 lb (114.8 kg)   BMI 43.43 kg/m    Physical Exam Constitutional:      Appearance: She is obese.  HENT:     Right Ear: External ear normal.     Left Ear: External ear normal.     Mouth/Throat:     Mouth: Mucous membranes are moist.     Pharynx: Oropharynx is clear.  Eyes:     Conjunctiva/sclera: Conjunctivae normal.  Cardiovascular:     Rate and Rhythm: Normal rate and regular rhythm.  Pulmonary:     Effort: Pulmonary effort is normal.     Breath sounds: Normal breath sounds.  Musculoskeletal:     Right lower leg: No edema.     Left lower leg: No edema.  Skin:    General: Skin is warm and dry.     Findings: No rash.  Neurological:     Mental Status: She is alert.     Deep Tendon Reflexes: Reflexes normal.  Psychiatric:        Mood and Affect: Mood normal.     Musculoskeletal Exam:  Neck full ROM no tenderness Shoulders full ROM no tenderness or swelling Elbows full ROM no tenderness or swelling Wrists full ROM no tenderness or swelling Fingers full ROM no tenderness or swelling Hip normal internal and external rotation without pain, no tenderness to lateral hip palpation, tightness with SLR and FADIR without significant pain Knees full ROM, patellofemoral  crepitus present, no effusions palpable Ankles full ROM no tenderness or swelling   Investigation: No additional findings.  Imaging: No results found.  Recent Labs: Lab Results  Component Value Date   WBC 6.6 07/06/2020   HGB 11.1 07/06/2020   PLT 312 07/06/2020   NA 140 10/06/2020   K 3.7 10/06/2020   CL 102 10/06/2020   CO2 21 10/06/2020   GLUCOSE 133 (H) 10/06/2020   BUN 11 10/06/2020   CREATININE 0.73 10/06/2020   BILITOT 0.3 07/06/2020   ALKPHOS 90 07/06/2020   AST 31 07/06/2020   ALT 32 07/06/2020   PROT 7.8 07/06/2020   ALBUMIN 4.5 07/06/2020   CALCIUM 9.7 10/06/2020   GFRAA 84 03/24/2020    Speciality Comments: No specialty comments available.  Procedures:  No procedures performed Allergies: Sulfa antibiotics, Sulfamethoxazole-trimethoprim, and Other   Assessment / Plan:     Visit Diagnoses: Positive ANA (antinuclear antibody) - Plan: Anti-Smith antibody, Anti-DNA antibody,  double-stranded, Sjogrens syndrome-A extractable nuclear antibody  Positive ANA at a moderate titer no specific clinical criteria no signs of inflammatory disease activity today.  We will check more specific antibody markers but lower pretest suspicion of disease activity.  Symptoms may also be explained by osteoarthritis changes there is also some probable deconditioning.  Provided some stretching exercise recommendations for the bilateral hips.  She is already taking meloxicam for arthritis.  Chronic pain of left knee  Chronic left knee pains she had previous swelling localized to this area may be more related to osteoarthritis or soft tissue structure insufficiency.  No current swelling or inflammatory changes today.  Orders: Orders Placed This Encounter  Procedures   Anti-Smith antibody   Anti-DNA antibody, double-stranded   Sjogrens syndrome-A extractable nuclear antibody    No orders of the defined types were placed in this encounter.    Follow-Up Instructions: No  follow-ups on file.   Collier Salina, MD  Note - This record has been created using [employer].  Chart creation errors have been sought, but may not always  have been located. Such creation errors do not reflect on  the standard of medical care.

## 2020-12-06 ENCOUNTER — Other Ambulatory Visit: Payer: Self-pay

## 2020-12-06 ENCOUNTER — Ambulatory Visit: Payer: Medicare Other | Admitting: Internal Medicine

## 2020-12-06 ENCOUNTER — Encounter: Payer: Self-pay | Admitting: Internal Medicine

## 2020-12-06 VITALS — BP 148/72 | HR 80 | Resp 15 | Ht 64.0 in | Wt 253.0 lb

## 2020-12-06 DIAGNOSIS — R768 Other specified abnormal immunological findings in serum: Secondary | ICD-10-CM | POA: Diagnosis not present

## 2020-12-06 DIAGNOSIS — G8929 Other chronic pain: Secondary | ICD-10-CM | POA: Diagnosis not present

## 2020-12-06 DIAGNOSIS — Z862 Personal history of diseases of the blood and blood-forming organs and certain disorders involving the immune mechanism: Secondary | ICD-10-CM | POA: Insufficient documentation

## 2020-12-06 DIAGNOSIS — F900 Attention-deficit hyperactivity disorder, predominantly inattentive type: Secondary | ICD-10-CM | POA: Insufficient documentation

## 2020-12-06 DIAGNOSIS — E1165 Type 2 diabetes mellitus with hyperglycemia: Secondary | ICD-10-CM | POA: Insufficient documentation

## 2020-12-06 DIAGNOSIS — M25562 Pain in left knee: Secondary | ICD-10-CM | POA: Diagnosis not present

## 2020-12-06 DIAGNOSIS — E049 Nontoxic goiter, unspecified: Secondary | ICD-10-CM | POA: Insufficient documentation

## 2020-12-06 DIAGNOSIS — E7800 Pure hypercholesterolemia, unspecified: Secondary | ICD-10-CM | POA: Insufficient documentation

## 2020-12-06 DIAGNOSIS — E039 Hypothyroidism, unspecified: Secondary | ICD-10-CM | POA: Insufficient documentation

## 2020-12-06 DIAGNOSIS — N951 Menopausal and female climacteric states: Secondary | ICD-10-CM | POA: Insufficient documentation

## 2020-12-06 NOTE — Patient Instructions (Signed)
Anti-DNA Antibody Test Why am I having this test? The anti-DNA antibody test helps with the diagnosis and follow-up of systemic lupus erythematosus (SLE). It is also used to monitor treatment of this condition as the antibody decreases with successful therapy. What is being tested? This test measures the amount of anti-DNA antibody in the blood. This antibody is found in 65-80% of patients with active SLE. This antibody is not as common in patients who have other diseases. What kind of sample is taken? A blood sample is required for this test. It is usually collected by inserting a needle into a blood vessel. How are the results reported? Your test results will be reported as a value. Your test results may also be reported as positive, intermediate, or negative. Your health care provider will compare your results to normal ranges that were established after testing a large group of people (reference values). Reference values may vary among labs and hospitals. For this test, common reference values are: Positive: 10 or more international units/mL. Intermediate: 5-9 international units/mL. Negative: Less than 5 international units/mL. What do the results mean? Positive results, which are associated with results that are higher than the reference values, may indicate: Autoimmune disorders such as SLE. Infectious mononucleosis. Chronic liver conditions. Intermediate results mean that the anti-DNA antibody levels are higher than normal, but not high enough to be considered positive. Negative results mean that you do not have the anti-DNA antibody that is associated with these conditions. Talk with your health care provider about what your results mean. Questions to ask your health care provider Ask your health care provider, or the department that is doing the test: When will my results be ready? How will I get my results? What are my treatment options? What other tests do I need? What are my  next steps? Summary The anti-DNA antibody test helps with the diagnosis and follow-up of systemic lupus erythematosus (SLE). It is also used to monitor treatment of this condition as the antibody decreases with successful therapy. This test measures the amount of anti-DNA antibody in the blood. Elevated levels of anti-DNA antibody can be seen in patients with SLE and certain other conditions. This information is not intended to replace advice given to you by your health care provider. Make sure you discuss any questions you have with your health care provider. Document Revised: 10/03/2019 Document Reviewed: 10/03/2019 Elsevier Patient Education  [place].

## 2020-12-07 LAB — ANTI-SMITH ANTIBODY: ENA SM Ab Ser-aCnc: <1 AI

## 2020-12-07 LAB — SJOGRENS SYNDROME-A EXTRACTABLE NUCLEAR ANTIBODY: SSA (Ro) (ENA) Antibody, IgG: <1 AI

## 2020-12-07 LAB — ANTI-DNA ANTIBODY, DOUBLE-STRANDED: ds DNA Ab: <1 IU/mL

## 2020-12-10 NOTE — Progress Notes (Signed)
The specific blood tests for lupus or related disease are negative. I do not think the ANA is significant as a cause of symptoms and does not need follow up for this.

## 2020-12-14 ENCOUNTER — Telehealth: Payer: Self-pay

## 2020-12-14 NOTE — Chronic Care Management (AMB) (Signed)
    Called Michelle Hicks, No answer, left message of appointment on 12-15-2020 at 11:00 via telephone visit with Orlando Penner, Pharm D. Notified to have all medications, supplements, blood pressure and/or blood sugar logs available during appointment and to return call if need to reschedule.    Care Gaps: PNA Vac overdue  Covid booster overdue  AWV 03-31-2021  Star Rating Drug: Pravastatin 40 mg- Last filled 09-07-2020 90 [facility] Telmisartan 80 mg- last filled 10-25-2020 90 [facility] Ozempic 1 mg- Patient assistance  Any gaps in medications fill history? Yes  [place] Pharmacist Assistant [phone]

## 2020-12-15 ENCOUNTER — Ambulatory Visit (INDEPENDENT_AMBULATORY_CARE_PROVIDER_SITE_OTHER): Payer: Medicare Other

## 2020-12-15 DIAGNOSIS — E1165 Type 2 diabetes mellitus with hyperglycemia: Secondary | ICD-10-CM

## 2020-12-15 DIAGNOSIS — I10 Essential (primary) hypertension: Secondary | ICD-10-CM

## 2020-12-15 NOTE — Progress Notes (Signed)
Chronic Care Management Pharmacy Note  12/21/2020 Name:  Michelle Hicks MRN:  [id number] DOB:  10/24/1950  Summary: Patient reports that she is doing well and has been checking her BP and BS at home.   Recommendations/Changes made from today's visit: Recommend patient get a new BP cuff, and receive PPSV 23 vaccine.   Plan: Patient is going to purchase a new BP cuff and start checking her BP more frequently, she is also going to receive the PPSV 23 vaccine.    Subjective: Michelle Hicks is an 70 y.o. year old female who is a primary patient of Glendale Chard, MD.  The CCM team was consulted for assistance with disease management and care coordination needs.    Engaged with patient by telephone for follow up visit in response to provider referral for pharmacy case management and/or care coordination services.   Consent to Services:  The patient was given information about Chronic Care Management services, agreed to services, and gave verbal consent prior to initiation of services.  Please see initial visit note for detailed documentation.   Patient Care Team: Glendale Chard, MD as PCP - General (Internal Medicine) Mayford Knife, [facility] (Pharmacist)  Recent office visits: 10/06/2020 PCP OV  Recent consult visits: 12/06/2020 Rheumatology OV  10/12/2020 Neurology [facility] visits: None in previous 6 months   Objective:  Lab Results  Component Value Date   CREATININE 0.73 10/06/2020   BUN 11 10/06/2020   GFRNONAA 73 03/24/2020   GFRAA 84 03/24/2020   NA 140 10/06/2020   K 3.7 10/06/2020   CALCIUM 9.7 10/06/2020   CO2 21 10/06/2020   GLUCOSE 133 (H) 10/06/2020    Lab Results  Component Value Date/Time   HGBA1C 8.0 (H) 10/06/2020 11:17 AM   HGBA1C 7.4 (H) 07/06/2020 12:14 PM   HGBA1C 7.3 09/04/2017 12:00 AM   MICROALBUR 10 07/06/2020 12:47 PM   MICROALBUR 30 03/24/2020 10:27 AM    Last diabetic Eye exam:  Lab Results  Component Value Date/Time    HMDIABEYEEXA No Retinopathy 01/12/2020 12:00 AM    Last diabetic Foot exam: No results found for: HMDIABFOOTEX   Lab Results  Component Value Date   CHOL 168 07/06/2020   HDL 67 07/06/2020   LDLCALC 89 07/06/2020   TRIG 61 07/06/2020   CHOLHDL 2.5 07/06/2020    Hepatic Function Latest Ref Rng & Units 07/06/2020 12/03/2019 03/18/2019  Total Protein 6.0 - 8.5 g/dL 7.8 7.6 7.5  Albumin 3.8 - 4.8 g/dL 4.5 4.3 4.5  AST 0 - 40 IU/L 31 30 49(H)  ALT 0 - 32 IU/L 32 35(H) 58(H)  Alk Phosphatase 44 - 121 IU/L 90 93 73  Total Bilirubin 0.0 - 1.2 mg/dL 0.3 0.2 0.3    Lab Results  Component Value Date/Time   TSH 1.020 10/06/2020 11:17 AM   TSH 1.600 03/24/2020 10:34 AM   FREET4 1.12 10/06/2020 11:17 AM   FREET4 1.17 06/11/2018 09:44 AM    CBC Latest Ref Rng & Units 07/06/2020 03/18/2019 09/10/2018  WBC 3.4 - 10.8 x10E3/uL 6.6 7.5 6.8  Hemoglobin 11.1 - 15.9 g/dL 11.1 10.3(L) 11.2  Hematocrit 34.0 - 46.6 % 33.5(L) 31.1(L) 34.5  Platelets 150 - 450 x10E3/uL 312 266 283    No results found for: VD25OH  Clinical ASCVD: No  The 10-year ASCVD risk score (Arnett DK, et al., 2019) is: 27%   Values used to calculate the score:     Age: 43 years  Sex: Female     Is Non-Hispanic African American: Yes     Diabetic: Yes     Tobacco smoker: No     Systolic Blood Pressure: 034 mmHg     Is BP treated: Yes     HDL Cholesterol: 67 mg/dL     Total Cholesterol: 168 mg/dL    Depression screen [facility] 2/9 03/24/2020 07/10/2019 04/15/2019  Decreased Interest 0 0 0  Down, Depressed, Hopeless 0 0 0  PHQ - 2 Score 0 0 0  Altered sleeping - 3 -  Tired, decreased energy - 0 -  Change in appetite - 0 -  Feeling bad or failure about yourself  - 0 -  Trouble concentrating - 0 -  Moving slowly or fidgety/restless - 0 -  Suicidal thoughts - 0 -  PHQ-9 Score - 3 -  Difficult doing work/chores - Not difficult at all -     Social History   Tobacco Use  Smoking Status Former   Packs/day: 0.50   Years: 15.00    Pack years: 7.50   Types: Cigarettes   Quit date: 2002   Years since quitting: 20.8  Smokeless Tobacco Never   BP Readings from Last 3 Encounters:  12/06/20 (!) 148/72  10/12/20 (!) 158/82  10/06/20 128/78   Pulse Readings from Last 3 Encounters:  12/06/20 80  10/12/20 85  10/06/20 82   Wt Readings from Last 3 Encounters:  12/06/20 253 lb (114.8 kg)  10/12/20 251 lb (113.9 kg)  10/06/20 250 lb 9.6 oz (113.7 kg)   BMI Readings from Last 3 Encounters:  12/06/20 43.43 kg/m  10/12/20 41.77 kg/m  10/06/20 42.48 kg/m    Assessment/Interventions: Review of patient past medical history, allergies, medications, health status, including review of consultants reports, laboratory and other test data, was performed as part of comprehensive evaluation and provision of chronic care management services.   SDOH:  (Social Determinants of Health) assessments and interventions performed: No  SDOH Screenings   Alcohol Screen: Not on file  Depression (PHQ2-9): Low Risk    PHQ-2 Score: 0  Financial Resource Strain: High Risk   Difficulty of Paying Living Expenses: Very hard  Food Insecurity: No Food Insecurity   Worried About Charity fundraiser in the Last Year: Never true   Ran Out of Food in the Last Year: Never true  Housing: Not on file  Physical Activity: Insufficiently Active   Days of Exercise per Week: 5 days   Minutes of Exercise per Session: 20 min  Social Connections: Not on file  Stress: No Stress Concern Present   Feeling of Stress : Not at all  Tobacco Use: Medium Risk   Smoking Tobacco Use: Former   Smokeless Tobacco Use: Never   Passive Exposure: Not on file  Transportation Needs: No Transportation Needs   Lack of Transportation (Medical): No   Lack of Transportation (Non-Medical): No    CCM Care Plan  Allergies  Allergen Reactions   Sulfa Antibiotics    Sulfamethoxazole-Trimethoprim     Other reaction(s): Unknown   Other Rash    Medications Reviewed  Today     Reviewed by Mayford Knife, RPH (Pharmacist) on 12/15/20 at 1122  Med List Status: <None>   Medication Order Taking? Sig Documenting Provider Last Dose Status Informant  aspirin EC 81 MG tablet [id number] No Take 81 mg by mouth daily. Some times [provider] Taking Active Self  Cholecalciferol (VITAMIN D-3 PO) [id number] No Take 2,000 Units by  mouth daily.  [provider] Taking Active   docusate sodium (COLACE) 100 MG capsule [id number] No Take 200 mg by mouth daily as needed.  [provider] Taking Active   FLUoxetine (PROZAC) 20 MG capsule [id number] No 3 capsules [provider] Taking Active   hydrochlorothiazide (HYDRODIURIL) 25 MG tablet [id number] No Take 1 tablet (25 mg total) by mouth daily. Glendale Chard, MD Taking Active   Lancets Misc. (ONE TOUCH SURESOFT) [place] [id number] No Use as directed to check blood sugars 2 times per day dx e11.65 Glendale Chard, MD Taking Active   levothyroxine (SYNTHROID) 50 MCG tablet [id number] No Take 50 mcg by mouth daily.  [provider] Taking Active   meloxicam (MOBIC) 15 MG tablet [id number] No TAKE 1 TABLET BY MOUTH  DAILY AS NEEDED Glendale Chard, MD Taking Active   Multiple Vitamin (MULTIVITAMIN) tablet [id number] No Take 1 tablet by mouth daily. [provider] Taking Active   omeprazole (PRILOSEC) 40 MG capsule [id number] No TAKE 1 CAPSULE BY MOUTH  DAILY BEFORE A MEAL Glendale Chard, MD Taking Active   [facility] ULTRA test strip [id number] No USE AS DIRECTED TO CHECK  BLOOD SUGAR TWICE DAILY Glendale Chard, MD Taking Active   pravastatin (PRAVACHOL) 40 MG tablet [id number] No Take 1 tablet (40 mg total) by mouth daily. Glendale Chard, MD Taking Active   Semaglutide, 1 MG/DOSE, (OZEMPIC, 1 MG/DOSE,) 2 MG/1.5ML SOPN [id number] No Inject 1 mg into the skin once a week. Glendale Chard, MD Taking Active   telmisartan (MICARDIS) 80 MG tablet [id number] No TAKE 1 TABLET BY MOUTH  DAILY Glendale Chard, MD Taking Active   traZODone (DESYREL) 50 MG tablet [id number] No Take 1 tablet (50 mg total) by mouth at bedtime as needed for sleep. Dohmeier, Asencion Partridge, MD Taking Active             Patient Active Problem List   Diagnosis Date Noted   Attention deficit hyperactivity disorder, predominantly inattentive type 12/06/2020   History of anemia 12/06/2020   Hyperglycemia due to type 2 diabetes mellitus ([place]) 12/06/2020   Hypothyroidism 12/06/2020   Menopausal symptom 12/06/2020   Non-toxic goiter 12/06/2020   Pure hypercholesterolemia 12/06/2020   Positive ANA (antinuclear antibody) 12/06/2020   Pain in left knee 12/06/2020   Chronic insomnia 04/26/2020   Snoring 04/26/2020   Class 3 severe obesity due to excess calories with serious comorbidity and body mass index (BMI) of 40.0 to 44.9 in adult ([place]) 03/18/2019   Essential hypertension, benign 11/13/2017   Gastroesophageal reflux disease without esophagitis 11/13/2017   Hypertension 09/04/2017   Type II diabetes mellitus, uncontrolled 09/04/2017   S/P abdominal hysterectomy and left salpingo-oophorectomy 08/18/1994    Immunization History  Administered Date(s) Administered   DTaP 02/28/2012   Fluad Quad(high Dose 65+) 11/07/2019   Influenza, High Dose Seasonal PF 11/14/2017, 10/30/2018   Influenza-Unspecified 11/14/2017, 10/30/2018, 11/10/2020   Moderna Sars-Covid-2 Vaccination 03/28/2019, 04/25/2019, 02/03/2020, 07/04/2020   Pneumococcal Conjugate-13 04/12/2018   Pneumococcal-Unspecified 08/06/2013   Zoster Recombinat (Shingrix) 11/14/2018, 07/29/2019    Conditions to be addressed/monitored:  Hypertension and Diabetes  Care Plan : [place]  Updates made by Mayford Knife, [place] since 12/21/2020 12:00 AM     Problem: HTN, DM II   Priority: High     Long-Range Goal: Disease Management   Recent Progress: On track  Priority: High  Note:   Current Barriers:  Unable to independently monitor  therapeutic efficacy  Pharmacist Clinical Goal(s):  Patient  will achieve adherence to monitoring guidelines and medication adherence to achieve therapeutic efficacy through collaboration with PharmD and provider.   Interventions: 1:1 collaboration with Glendale Chard, MD regarding development and update of comprehensive plan of care as evidenced by provider attestation and co-signature Inter-disciplinary care team collaboration (see longitudinal plan of care) Comprehensive medication review performed; medication list updated in electronic medical record  Hypertension (BP goal <130/80) -Controlled -Current treatment: Hydrochlorothiazide 25 mg tablet once per day  Telmisartan 80 mg tablet once per day  -Current home readings: 10/08/2020-16781 then 8 minutes later 134/68, week of 10/28/2020- 124/60, 137/69  -Current dietary habits: patient is limiting salt -She has started taking her BP more regularly, she reports that her BP cuff is over 36 years old -Recommended patient purchase a new BP cuff -Denies hypotensive/hypertensive symptoms -Educated on Importance of home blood pressure monitoring; Proper BP monitoring technique; Symptoms of hypotension and importance of maintaining adequate hydration; -Counseled to monitor BP at home at least four times per week, document, and provide log at future appointments -She is going to look into salt free seasonings  -Recommended to continue current medication Educated on how to properly check BP and the need for a new BP cuff.   Diabetes (A1c goal <7%) -Uncontrolled -Current medications: Ozempic 1 mg once a week on Tuesday  -Current home glucose readings - she does check her BS at home, the amount of times she checks it varies last checked on 12/11/2020 fasting glucose: 141, 10/28-114 Post prandial - 152  -Denies hypoglycemic/hyperglycemic symptoms  -Current meal patterns:  Breakfast eating later: oatmeal, walnuts and blueberries using Truvia  brown sugar, or [place] ham and egg biscuit, or a chicken buiscuit, peanut butter and crackers   dinner: chili beans and bologna sandwich, crack barrel country style steak with sweet potatoes and greens, grilled chicken cobb salad from [employer]  snacks: she is going to increase her nutritional snacks  drinks: she is drinking at least 64 ounces of water per day she is going to try to increase her bottles of water five  -Current exercise: she is working on exercising more, and she was walking at least 1/2 mile at [employer] on Complications of diabetes including kidney damage, retinal damage, and cardiovascular disease; -Ms. Lieder is going to get back to basics with being more conscience with her food she is going to start measuring some foods including her Stevia Complete -Counseled to check feet daily and get yearly eye exams -Counseled on diet and exercise extensively Recommended to continue current medication   Patient Goals/Self-Care Activities Patient will:  - take medications as prescribed as evidenced by patient report and record review  Follow Up Plan: The patient has been provided with contact information for the care management team and has been advised to call with any health related questions or concerns.       Medication Assistance: None required.  Patient affirms current coverage meets needs.  Compliance/Adherence/Medication fill history: Care Gaps: COVID -19 Vaccine -11/10/2020 [facility]  Pneumonia Vaccine PPSV 23   Star-Rating Drugs: Pravastatin 40 mg tablet Ozempic 1 mg once per week Telmisartan 80 mg tablet   Patient's preferred pharmacy is:  [facility] ([place], [place] [address] [place] [address] Phone: [phone] Fax: [phone]  [facility] ([facility]) - [place], [place]  [address] Phone:  [phone] Fax: [place], [place] [address] Phone: [phone] Fax: [phone]  Uses pill box? Yes Pt endorses 90% compliance  We discussed: Benefits of medication synchronization, packaging and delivery as well as enhanced pharmacist oversight with [employer]. Patient decided to: Continue current medication management strategy  Care Plan and Follow Up Patient Decision:  Patient agrees to Care Plan and Follow-up.  Plan: The patient has been provided with contact information for the care management team and has been advised to call with any health related questions or concerns.   Orlando Penner, PharmD Clinical Pharmacist [facility] [phone]

## 2020-12-21 DIAGNOSIS — G4733 Obstructive sleep apnea (adult) (pediatric): Secondary | ICD-10-CM | POA: Diagnosis not present

## 2020-12-21 NOTE — Patient Instructions (Addendum)
Visit Information It was great speaking with you today!  Please let me know if you have any questions about our visit.   Goals Addressed             This Visit's Progress    Manage My Medicine       Timeframe:  Long-Range Goal Priority:  High Start Date:                             Expected End Date:                       Follow Up Date 02/24/2021  In Progress:   - call for medicine refill 2 or 3 days before it runs out - call if I am sick and can't take my medicine - keep a list of all the medicines I take; vitamins and herbals too - use an alarm clock or phone to remind me to take my medicine    Why is this important?   These steps will help you keep on track with your medicines.   Notes:  Please call if you have any questions         Patient Care Plan: CCM Pharmacy Care Plan     Problem Identified: HTN, DM II   Priority: High     Long-Range Goal: Disease Management   Recent Progress: On track  Priority: High  Note:   Current Barriers:  Unable to independently monitor therapeutic efficacy  Pharmacist Clinical Goal(s):  Patient will achieve adherence to monitoring guidelines and medication adherence to achieve therapeutic efficacy through collaboration with PharmD and provider.   Interventions: 1:1 collaboration with Glendale Chard, MD regarding development and update of comprehensive plan of care as evidenced by provider attestation and co-signature Inter-disciplinary care team collaboration (see longitudinal plan of care) Comprehensive medication review performed; medication list updated in electronic medical record  Hypertension (BP goal <130/80) -Controlled -Current treatment: Hydrochlorothiazide 25 mg tablet once per day  Telmisartan 80 mg tablet once per day  -Current home readings: 10/08/2020-16781 then 8 minutes later 134/68, week of 10/28/2020- 124/60, 137/69  -Current dietary habits: patient is limiting salt -She has started taking her BP more  regularly, she reports that her BP cuff is over 65 years old -Recommended patient purchase a new BP cuff -Denies hypotensive/hypertensive symptoms -Educated on Importance of home blood pressure monitoring; Proper BP monitoring technique; Symptoms of hypotension and importance of maintaining adequate hydration; -Counseled to monitor BP at home at least four times per week, document, and provide log at future appointments -She is going to look into salt free seasonings  -Recommended to continue current medication Educated on how to properly check BP and the need for a new BP cuff.   Diabetes (A1c goal <7%) -Uncontrolled -Current medications: Ozempic 1 mg once a week on Tuesday  -Current home glucose readings - she does check her BS at home, the amount of times she checks it varies last checked on 12/11/2020 fasting glucose: 141, 10/28-114 Post prandial - 152  -Denies hypoglycemic/hyperglycemic symptoms  -Current meal patterns:  Breakfast eating later: oatmeal, walnuts and blueberries using Truvia brown sugar, or [place] ham and egg biscuit, or a chicken buiscuit, peanut butter and crackers   dinner: chili beans and bologna sandwich, crack barrel country style steak with sweet potatoes and greens, grilled chicken cobb salad from [employer]  snacks: she is going to increase her nutritional snacks  drinks: she is drinking at least 64 ounces of water per day she is going to try to increase her bottles of water five  -Current exercise: she is working on exercising more, and she was walking at least 1/2 mile at [employer] on Complications of diabetes including kidney damage, retinal damage, and cardiovascular disease; -Ms. Olivencia is going to get back to basics with being more conscience with her food she is going to start measuring some foods including her Stevia Complete -Counseled to check feet daily and get yearly eye exams -Counseled on diet and exercise extensively Recommended  to continue current medication   Patient Goals/Self-Care Activities Patient will:  - take medications as prescribed as evidenced by patient report and record review  Follow Up Plan: The patient has been provided with contact information for the care management team and has been advised to call with any health related questions or concerns.        Patient agreed to services and verbal consent obtained.   The patient verbalized understanding of instructions, educational materials, and care plan provided today and agreed to receive a mailed copy of patient instructions, educational materials, and care plan.   Orlando Penner, PharmD Clinical Pharmacist Orlando Penner, CPP, PharmD Clinical Pharmacist Practitioner [facility] [phone]

## 2020-12-26 DIAGNOSIS — G4733 Obstructive sleep apnea (adult) (pediatric): Secondary | ICD-10-CM | POA: Diagnosis not present

## 2020-12-29 ENCOUNTER — Telehealth: Payer: Self-pay

## 2020-12-29 NOTE — Chronic Care Management (AMB) (Signed)
    Chronic Care Management Pharmacy Assistant   Name: Michelle Hicks  MRN: [id number] DOB: March 14, 1950   Reason for Encounter: 2023 PAP   Medications: Outpatient Encounter Medications as of 12/29/2020  Medication Sig   aspirin EC 81 MG tablet Take 81 mg by mouth daily. Some times   Cholecalciferol (VITAMIN D-3 PO) Take 2,000 Units by mouth daily.    docusate sodium (COLACE) 100 MG capsule Take 200 mg by mouth daily as needed.    FLUoxetine (PROZAC) 20 MG capsule 3 capsules   hydrochlorothiazide (HYDRODIURIL) 25 MG tablet Take 1 tablet (25 mg total) by mouth daily.   Lancets Misc. (ONE TOUCH SURESOFT) MISC Use as directed to check blood sugars 2 times per day dx e11.65   levothyroxine (SYNTHROID) 50 MCG tablet Take 50 mcg by mouth daily.    meloxicam (MOBIC) 15 MG tablet TAKE 1 TABLET BY MOUTH  DAILY AS NEEDED   Multiple Vitamin (MULTIVITAMIN) tablet Take 1 tablet by mouth daily.   omeprazole (PRILOSEC) 40 MG capsule TAKE 1 CAPSULE BY MOUTH  DAILY BEFORE A MEAL   ONETOUCH ULTRA test strip USE AS DIRECTED TO CHECK  BLOOD SUGAR TWICE DAILY   pravastatin (PRAVACHOL) 40 MG tablet Take 1 tablet (40 mg total) by mouth daily.   Semaglutide, 1 MG/DOSE, (OZEMPIC, 1 MG/DOSE,) 2 MG/1.5ML SOPN Inject 1 mg into the skin once a week.   telmisartan (MICARDIS) 80 MG tablet TAKE 1 TABLET BY MOUTH  DAILY   traZODone (DESYREL) 50 MG tablet Take 1 tablet (50 mg total) by mouth at bedtime as needed for sleep.   No facility-administered encounter medications on file as of 12/29/2020.   12-29-2020: Initiated 2023 patient assistance for Ozempic. Printed and mailed application.  [place] Pharmacist Assistant [phone]

## 2020-12-30 ENCOUNTER — Other Ambulatory Visit: Payer: Self-pay

## 2020-12-30 ENCOUNTER — Ambulatory Visit (INDEPENDENT_AMBULATORY_CARE_PROVIDER_SITE_OTHER): Payer: Medicare Other | Admitting: Internal Medicine

## 2020-12-30 ENCOUNTER — Other Ambulatory Visit: Payer: Medicare Other

## 2020-12-30 ENCOUNTER — Encounter: Payer: Self-pay | Admitting: Internal Medicine

## 2020-12-30 VITALS — BP 122/80 | HR 85 | Temp 98.3°F | Ht 64.0 in | Wt 242.8 lb

## 2020-12-30 DIAGNOSIS — E1165 Type 2 diabetes mellitus with hyperglycemia: Secondary | ICD-10-CM

## 2020-12-30 DIAGNOSIS — R0982 Postnasal drip: Secondary | ICD-10-CM

## 2020-12-30 DIAGNOSIS — I10 Essential (primary) hypertension: Secondary | ICD-10-CM

## 2020-12-30 DIAGNOSIS — Z79899 Other long term (current) drug therapy: Secondary | ICD-10-CM

## 2020-12-30 DIAGNOSIS — M25562 Pain in left knee: Secondary | ICD-10-CM | POA: Diagnosis not present

## 2020-12-30 DIAGNOSIS — G8929 Other chronic pain: Secondary | ICD-10-CM | POA: Diagnosis not present

## 2020-12-30 DIAGNOSIS — Z23 Encounter for immunization: Secondary | ICD-10-CM | POA: Diagnosis not present

## 2020-12-30 DIAGNOSIS — Z6841 Body Mass Index (BMI) 40.0 and over, adult: Secondary | ICD-10-CM

## 2020-12-30 MED ORDER — TRAZODONE HCL 50 MG PO TABS: 50.0000 mg | ORAL_TABLET | Freq: Every evening | ORAL | 1 refills | Status: DC | PRN

## 2020-12-30 NOTE — Progress Notes (Signed)
Rich Brave [place],acting as a Education administrator for [name], MD.,have documented all relevant documentation on the behalf of [name], MD,as directed by  [name], MD while in the presence of [name], MD.  This visit occurred during the SARS-CoV-2 public health emergency.  Safety protocols were in place, including screening questions prior to the visit, additional usage of staff PPE, and extensive cleaning of exam room while observing appropriate contact time as indicated for disinfecting solutions.  Subjective:     Patient ID: Michelle Hicks , female    DOB: [birth year] , 70 y.o.   MRN: [id number]   Chief Complaint  Patient presents with   Diabetes   Hypertension   Cough    HPI  The patient is here today for a follow-up on her diabetes and blood pressure.  She reports compliance with meds.  She states her sugars range between 85-200s. She acknowledges that her knee has been bothering her, so she hasn't been exercising much. Additionally, her family has been bringing her unhealthy food to eat.    She adds that she has dry cough. Denies fever/chills. Wakes up and has to clear her throat upon awakening. No ill contacts.   Diabetes She presents for her follow-up diabetic visit. She has type 2 diabetes mellitus. Her disease course has been stable. There are no hypoglycemic associated symptoms. There are no diabetic associated symptoms. Pertinent negatives for diabetes include no blurred vision and no chest pain. There are no hypoglycemic complications. Symptoms are stable. Risk factors for coronary artery disease include diabetes mellitus, obesity, sedentary lifestyle and post-menopausal. She is compliant with treatment most of the time. She is following a generally healthy diet. She participates in exercise intermittently. An ACE inhibitor/angiotensin II receptor blocker is being taken. Eye exam is current.  Hypertension This is a chronic problem. The current episode  started more than 1 year ago. The problem has been gradually improving since onset. The problem is controlled. Pertinent negatives include no blurred vision, chest pain or shortness of breath. The current treatment provides moderate improvement.    Past Medical History:  Diagnosis Date   Depression    DM (diabetes mellitus) ([place])    HTN (hypertension)      Family History  Problem Relation Age of Onset   Diabetes Mother    Pancreatic cancer Mother    Hypertension Father    Heart attack Father      Current Outpatient Medications:    aspirin EC 81 MG tablet, Take 81 mg by mouth daily. Some times, Disp: , Rfl:    Cholecalciferol (VITAMIN D-3 PO), Take 2,000 Units by mouth daily. , Disp: , Rfl:    hydrochlorothiazide (HYDRODIURIL) 25 MG tablet, Take 1 tablet (25 mg total) by mouth daily., Disp: 90 tablet, Rfl: 3   Lancets Misc. (ONE TOUCH SURESOFT) MISC, Use as directed to check blood sugars 2 times per day dx e11.65, Disp: 300 each, Rfl: 2   levothyroxine (SYNTHROID) 50 MCG tablet, Take 50 mcg by mouth daily. , Disp: , Rfl:    meloxicam (MOBIC) 15 MG tablet, TAKE 1 TABLET BY MOUTH  DAILY AS NEEDED, Disp: 90 tablet, Rfl: 1   Multiple Vitamin (MULTIVITAMIN) tablet, Take 1 tablet by mouth daily., Disp: , Rfl:    omeprazole (PRILOSEC) 40 MG capsule, TAKE 1 CAPSULE BY MOUTH  DAILY BEFORE A MEAL, Disp: 90 capsule, Rfl: 2   ONETOUCH ULTRA test strip, USE AS DIRECTED TO CHECK  BLOOD SUGAR TWICE  DAILY, Disp: 200 strip, Rfl: 3   pravastatin (PRAVACHOL) 40 MG tablet, Take 1 tablet (40 mg total) by mouth daily., Disp: 90 tablet, Rfl: 2   Semaglutide, 1 MG/DOSE, (OZEMPIC, 1 MG/DOSE,) 2 MG/1.5ML SOPN, Inject 1 mg into the skin once a week., Disp: 6 pen, Rfl: 1   telmisartan (MICARDIS) 80 MG tablet, TAKE 1 TABLET BY MOUTH  DAILY, Disp: 90 tablet, Rfl: 3   traZODone (DESYREL) 50 MG tablet, Take 1 tablet (50 mg total) by mouth at bedtime as needed for sleep., Disp: 90 tablet, Rfl: 1   Allergies   Allergen Reactions   Sulfa Antibiotics    Sulfamethoxazole-Trimethoprim     Other reaction(s): Unknown   Other Rash     Review of Systems  Constitutional: Negative.   Eyes:  Negative for blurred vision.  Respiratory:  Positive for cough. Negative for shortness of breath.        She c/o dry cough. Sx started about 2 weeks ago. Denies fever/chills. Also with some sinus drainage.   Cardiovascular: Negative.  Negative for chest pain.  Neurological: Negative.   Psychiatric/Behavioral: Negative.      Today's Vitals   12/30/20 1409  BP: 122/80  Pulse: 85  Temp: 98.3 F (36.8 C)  Weight: 242 lb 12.8 oz (110.1 kg)  Height: 5' 4"  (1.626 m)  PainSc: 0-No pain   Body mass index is 41.68 kg/m.  Wt Readings from Last 3 Encounters:  12/30/20 242 lb 12.8 oz (110.1 kg)  12/06/20 253 lb (114.8 kg)  10/12/20 251 lb (113.9 kg)     Objective:  Physical Exam Vitals and nursing note reviewed.  Constitutional:      Appearance: Normal appearance. She is obese.  HENT:     Head: Normocephalic and atraumatic.     Nose:     Comments: Masked     Mouth/Throat:     Comments: Masked  Eyes:     Extraocular Movements: Extraocular movements intact.  Cardiovascular:     Rate and Rhythm: Normal rate and regular rhythm.     Heart sounds: Normal heart sounds.  Pulmonary:     Effort: Pulmonary effort is normal.     Breath sounds: Normal breath sounds.  Musculoskeletal:     Cervical back: Normal range of motion.  Skin:    General: Skin is warm.  Neurological:     General: No focal deficit present.     Mental Status: She is alert.  Psychiatric:        Mood and Affect: Mood normal.        Behavior: Behavior normal.        Assessment And Plan:     1. Uncontrolled type 2 diabetes mellitus with hyperglycemia (HCC) Comments: Chronic, I will check labs as listed below. She was commended for getting back on track w/ her eating plan. - CMP14+EGFR - Hemoglobin A1c  2. Essential hypertension,  benign Comments: Chronic, controlled. She is encouraged to follow low sodium diet. No med changes.   3. Postnasal drip Comments: Advised to try OTC loratadine or cetirizine. She will let me know if her sx persist.   4. Chronic pain of left knee Comments: Chronic. She reports having meniscal tear. She is followed by Ortho. She does not wish to pursue surgical treatment at this time.  5. Class 3 severe obesity due to excess calories with serious comorbidity and body mass index (BMI) of 40.0 to 44.9 in adult [facility]) Comments: BMI 41. Encouraged to consider aqua exercises  to have less pressure on her joints. Advised to strive for BMi less than 35 to decrease cardiac risk.   6. Immunization due Comments: She was given pneumovax-23 IM x 1.   7. Polypharmacy - Vitamin B12   Patient was given opportunity to ask questions. Patient verbalized understanding of the plan and was able to repeat key elements of the plan. All questions were answered to their satisfaction.   I, [name], MD, have reviewed all documentation for this visit. The documentation on 12/30/20 for the exam, diagnosis, procedures, and orders are all accurate and complete.   IF YOU HAVE BEEN REFERRED TO A SPECIALIST, IT MAY TAKE 1-2 WEEKS TO SCHEDULE/PROCESS THE REFERRAL. IF YOU HAVE NOT HEARD FROM US/SPECIALIST IN TWO WEEKS, PLEASE GIVE [place] A CALL AT [phone].   THE PATIENT IS ENCOURAGED TO PRACTICE SOCIAL DISTANCING DUE TO THE COVID-19 PANDEMIC.

## 2020-12-30 NOTE — Patient Instructions (Signed)

## 2020-12-31 ENCOUNTER — Encounter: Payer: Self-pay | Admitting: Internal Medicine

## 2020-12-31 LAB — HEMOGLOBIN A1C
Est. average glucose Bld gHb Est-mCnc: 148 mg/dL
Hgb A1c MFr Bld: 6.8 % — ABNORMAL HIGH (ref 4.8–5.6)

## 2020-12-31 LAB — CMP14+EGFR
ALT: 37 IU/L — ABNORMAL HIGH (ref 0–32)
AST: 35 IU/L (ref 0–40)
Albumin/Globulin Ratio: 1.6 (ref 1.2–2.2)
Albumin: 4.9 g/dL — ABNORMAL HIGH (ref 3.8–4.8)
Alkaline Phosphatase: 90 IU/L (ref 44–121)
BUN/Creatinine Ratio: 14 (ref 12–28)
BUN: 12 mg/dL (ref 8–27)
Bilirubin Total: 0.3 mg/dL (ref 0.0–1.2)
CO2: 26 mmol/L (ref 20–29)
Calcium: 10.1 mg/dL (ref 8.7–10.3)
Chloride: 102 mmol/L (ref 96–106)
Creatinine, Ser: 0.83 mg/dL (ref 0.57–1.00)
Globulin, Total: 3 g/dL (ref 1.5–4.5)
Glucose: 93 mg/dL (ref 70–99)
Potassium: 4.6 mmol/L (ref 3.5–5.2)
Sodium: 141 mmol/L (ref 134–144)
Total Protein: 7.9 g/dL (ref 6.0–8.5)
eGFR: 77 mL/min/{1.73_m2} (ref >59)

## 2020-12-31 LAB — VITAMIN B12: Vitamin B-12: 1502 pg/mL — ABNORMAL HIGH (ref 232–1245)

## 2021-01-03 ENCOUNTER — Telehealth: Payer: Medicare Other

## 2021-01-04 DIAGNOSIS — E7800 Pure hypercholesterolemia, unspecified: Secondary | ICD-10-CM | POA: Diagnosis not present

## 2021-01-04 DIAGNOSIS — I10 Essential (primary) hypertension: Secondary | ICD-10-CM | POA: Diagnosis not present

## 2021-01-04 DIAGNOSIS — E1165 Type 2 diabetes mellitus with hyperglycemia: Secondary | ICD-10-CM | POA: Diagnosis not present

## 2021-01-04 DIAGNOSIS — E049 Nontoxic goiter, unspecified: Secondary | ICD-10-CM | POA: Diagnosis not present

## 2021-01-04 DIAGNOSIS — E039 Hypothyroidism, unspecified: Secondary | ICD-10-CM | POA: Diagnosis not present

## 2021-01-05 ENCOUNTER — Other Ambulatory Visit: Payer: Self-pay | Admitting: Endocrinology

## 2021-01-05 DIAGNOSIS — E039 Hypothyroidism, unspecified: Secondary | ICD-10-CM

## 2021-01-11 DIAGNOSIS — H16223 Keratoconjunctivitis sicca, not specified as Sjogren's, bilateral: Secondary | ICD-10-CM | POA: Diagnosis not present

## 2021-01-11 DIAGNOSIS — H2513 Age-related nuclear cataract, bilateral: Secondary | ICD-10-CM | POA: Diagnosis not present

## 2021-01-11 DIAGNOSIS — E119 Type 2 diabetes mellitus without complications: Secondary | ICD-10-CM | POA: Diagnosis not present

## 2021-01-11 DIAGNOSIS — H5203 Hypermetropia, bilateral: Secondary | ICD-10-CM | POA: Diagnosis not present

## 2021-01-11 DIAGNOSIS — H47323 Drusen of optic disc, bilateral: Secondary | ICD-10-CM | POA: Diagnosis not present

## 2021-01-11 LAB — HM DIABETES EYE EXAM

## 2021-01-12 DIAGNOSIS — I10 Essential (primary) hypertension: Secondary | ICD-10-CM

## 2021-01-12 DIAGNOSIS — E1165 Type 2 diabetes mellitus with hyperglycemia: Secondary | ICD-10-CM

## 2021-01-14 ENCOUNTER — Ambulatory Visit
Admission: RE | Admit: 2021-01-14 | Discharge: 2021-01-14 | Disposition: A | Discharge status: Home / Self Care | Payer: Medicare Other | Source: Ambulatory Visit | Attending: Endocrinology | Admitting: Endocrinology

## 2021-01-14 DIAGNOSIS — E041 Nontoxic single thyroid nodule: Secondary | ICD-10-CM | POA: Diagnosis not present

## 2021-01-14 DIAGNOSIS — E039 Hypothyroidism, unspecified: Secondary | ICD-10-CM

## 2021-01-18 DIAGNOSIS — L918 Other hypertrophic disorders of the skin: Secondary | ICD-10-CM | POA: Diagnosis not present

## 2021-01-18 DIAGNOSIS — L820 Inflamed seborrheic keratosis: Secondary | ICD-10-CM | POA: Diagnosis not present

## 2021-01-25 ENCOUNTER — Telehealth: Payer: Medicare Other

## 2021-01-25 ENCOUNTER — Ambulatory Visit (INDEPENDENT_AMBULATORY_CARE_PROVIDER_SITE_OTHER): Payer: Medicare Other

## 2021-01-25 DIAGNOSIS — E782 Mixed hyperlipidemia: Secondary | ICD-10-CM

## 2021-01-25 DIAGNOSIS — G4733 Obstructive sleep apnea (adult) (pediatric): Secondary | ICD-10-CM | POA: Diagnosis not present

## 2021-01-25 DIAGNOSIS — E1165 Type 2 diabetes mellitus with hyperglycemia: Secondary | ICD-10-CM

## 2021-01-25 DIAGNOSIS — I10 Essential (primary) hypertension: Secondary | ICD-10-CM

## 2021-01-26 NOTE — Chronic Care Management (AMB) (Signed)
Chronic Care Management   CCM RN Visit Note  01/25/2021 Name: Michelle Hicks MRN: [id number] DOB: 02/11/1951  Subjective: Michelle Hicks is a 70 y.o. year old female who is a primary care patient of Glendale Chard, MD. The care management team was consulted for assistance with disease management and care coordination needs.    Engaged with patient by telephone for follow up visit in response to provider referral for case management and/or care coordination services.   Consent to Services:  The patient was given information about Chronic Care Management services, agreed to services, and gave verbal consent prior to initiation of services.  Please see initial visit note for detailed documentation.   Patient agreed to services and verbal consent obtained.   Assessment: Review of patient past medical history, allergies, medications, health status, including review of consultants reports, laboratory and other test data, was performed as part of comprehensive evaluation and provision of chronic care management services.   SDOH (Social Determinants of Health) assessments and interventions performed:  Yes, no acute needs  CCM Care Plan  Allergies  Allergen Reactions   Sulfa Antibiotics    Sulfamethoxazole-Trimethoprim     Other reaction(s): Unknown   Other Rash    Outpatient Encounter Medications as of 01/25/2021  Medication Sig   aspirin EC 81 MG tablet Take 81 mg by mouth daily. Some times   Cholecalciferol (VITAMIN D-3 PO) Take 2,000 Units by mouth daily.    hydrochlorothiazide (HYDRODIURIL) 25 MG tablet Take 1 tablet (25 mg total) by mouth daily.   Lancets Misc. (ONE TOUCH SURESOFT) MISC Use as directed to check blood sugars 2 times per day dx e11.65   levothyroxine (SYNTHROID) 50 MCG tablet Take 50 mcg by mouth daily.    meloxicam (MOBIC) 15 MG tablet TAKE 1 TABLET BY MOUTH  DAILY AS NEEDED   Multiple Vitamin (MULTIVITAMIN) tablet Take 1 tablet by mouth daily.   omeprazole  (PRILOSEC) 40 MG capsule TAKE 1 CAPSULE BY MOUTH  DAILY BEFORE A MEAL   ONETOUCH ULTRA test strip USE AS DIRECTED TO CHECK  BLOOD SUGAR TWICE DAILY   pravastatin (PRAVACHOL) 40 MG tablet Take 1 tablet (40 mg total) by mouth daily.   Semaglutide, 1 MG/DOSE, (OZEMPIC, 1 MG/DOSE,) 2 MG/1.5ML SOPN Inject 1 mg into the skin once a week.   telmisartan (MICARDIS) 80 MG tablet TAKE 1 TABLET BY MOUTH  DAILY   traZODone (DESYREL) 50 MG tablet Take 1 tablet (50 mg total) by mouth at bedtime as needed for sleep.   No facility-administered encounter medications on file as of 01/25/2021.    Patient Active Problem List   Diagnosis Date Noted   Attention deficit hyperactivity disorder, predominantly inattentive type 12/06/2020   History of anemia 12/06/2020   Hyperglycemia due to type 2 diabetes mellitus ([place]) 12/06/2020   Hypothyroidism 12/06/2020   Menopausal symptom 12/06/2020   Non-toxic goiter 12/06/2020   Pure hypercholesterolemia 12/06/2020   Positive ANA (antinuclear antibody) 12/06/2020   Pain in left knee 12/06/2020   Chronic insomnia 04/26/2020   Snoring 04/26/2020   Class 3 severe obesity due to excess calories with serious comorbidity and body mass index (BMI) of 40.0 to 44.9 in adult [facility]) 03/18/2019   Essential hypertension, benign 11/13/2017   Gastroesophageal reflux disease without esophagitis 11/13/2017   Hypertension 09/04/2017   Type II diabetes mellitus, uncontrolled 09/04/2017   S/P abdominal hysterectomy and left salpingo-oophorectomy 08/18/1994    Conditions to be addressed/monitored: HTN, DM II, HLD  Care Plan :  RN Care Manager Plan of Care  Updates made by Lynne Logan, RN since 01/25/2021 12:00 AM     Problem: No plan of care established for management of chronic disease states (HTN, DM II, HLD)   Priority: High     Long-Range Goal: Development of plan of care for chronic disease states (HTN, DM II, HLD)   Start Date: 01/25/2021  Expected End Date: 01/25/2022   This Visit's Progress: On track  Priority: High  Note:   Current Barriers:  Knowledge Deficits related to plan of care for management of HTN, DM II, HLD  Chronic Disease Management support and education needs related to HTN, DM II, HLD   RNCM Clinical Goal(s):  Patient will verbalize basic understanding of  HTN, DM II, HLD disease process and self health management plan as evidenced by patient will report having no disease exacerbations related to her chronic disease states listed above take all medications exactly as prescribed and will call provider for medication related questions as evidenced by patient will report having no misses doses of her prescribed medications demonstrate Ongoing health management independence as evidenced by patient will report 100% adherence to her prescribed treatment plan  continue to work with RN Care Manager to address care management and care coordination needs related to  HTN, DM II, HLD as evidenced by adherence to CM Team Scheduled appointments demonstrate ongoing self health care management ability   as evidenced by    through collaboration with RN Care manager, provider, and care team.   Interventions: 1:1 collaboration with primary care provider regarding development and update of comprehensive plan of care as evidenced by provider attestation and co-signature Inter-disciplinary care team collaboration (see longitudinal plan of care) Evaluation of current treatment plan related to  self management and patient's adherence to plan as established by provider  Diabetes Interventions:  (Status:  Goal on track:  Yes.) Long Term Goal Assessed patient's understanding of A1c goal: <6.5% Provided education to patient about basic DM disease process Reviewed medications with patient and discussed importance of medication adherence Counseled on importance of regular laboratory monitoring as prescribed Provided patient with written educational materials related to  hypo and hyperglycemia and importance of correct treatment Advised patient, providing education and rationale, to check cbg daily before meals  and record, calling PCP and or RN CM  for findings outside established parameters Review of patient status, including review of consultants reports, relevant laboratory and other test results, and medications completed Assessed social determinant of health barriers Discussed plans with patient for ongoing care management follow up and provided patient with direct contact information for care management team Lab Results  Component Value Date   HGBA1C 6.8 (H) 12/30/2020  Hypertension Interventions:  (Status:  Goal on track:  Yes.) Long Term Goal Last practice recorded BP readings:  BP Readings from Last 3 Encounters:  12/30/20 122/80  12/06/20 (!) 148/72  10/12/20 (!) 158/82  Most recent eGFR/CrCl:  Lab Results  Component Value Date   EGFR 77 12/30/2020    No components found for: CRCL  Evaluation of current treatment plan related to hypertension self management and patient's adherence to plan as established by provider Reviewed medications with patient and discussed importance of compliance Counseled on the importance of exercise goals with target of 150 minutes per week Advised patient, providing education and rationale, to monitor blood pressure daily and record, calling PCP for findings outside established parameters Provided education on prescribed diet low Sodium Discussed complications of poorly controlled  blood pressure such as heart disease, stroke, circulatory complications, vision complications, kidney impairment, sexual dysfunction Discussed plans with patient for ongoing care management follow up and provided patient with direct contact information for care management team  Patient Goals/Self-Care Activities: Take all medications as prescribed Attend all scheduled provider appointments Call pharmacy for medication refills 3-7 days in  advance of running out of medications Perform all self care activities independently  Perform IADL's (shopping, preparing meals, housekeeping, managing finances) independently Call provider office for new concerns or questions  drink 6 to 8 glasses of water each day eat fish at least once per week fill half of plate with vegetables manage portion size call doctor for signs and symptoms of high blood pressure keep all doctor appointments take medications for blood pressure exactly as prescribed report new symptoms to your doctor  Follow Up Plan:  Telephone follow up appointment with care management team member scheduled for:  04/04/21      Plan:Telephone follow up appointment with care management team member scheduled for:  04/04/21  Barb Merino, RN, BSN, CCM Care Management Coordinator [place] [facility]  Direct Phone: [phone]

## 2021-01-26 NOTE — Patient Instructions (Signed)
Visit Information   Thank you for taking time to visit with me today. Please don't hesitate to contact me if I can be of assistance to you before our next scheduled telephone appointment.  Following are the goals we discussed today:  (Copy and paste patient goals from clinical care plan here)  Our next appointment is by telephone on 04/04/21 at 10:20 AM  Please call the care guide team at [phone] if you need to cancel or reschedule your appointment.   If you are experiencing a Mental Health or [place] or need someone to talk to, please call 1-800-273-TALK (toll free, 24 hour hotline)   Following is a copy of your full care plan:  Care Plan : [place] of Care  Updates made by Lynne Logan, RN since 01/25/2021 12:00 AM     Problem: No plan of care established for management of chronic disease states (HTN, DM II, HLD)   Priority: High     Long-Range Goal: Development of plan of care for chronic disease states (HTN, DM II, HLD)   Start Date: 01/25/2021  Expected End Date: 01/25/2022  This Visit's Progress: On track  Priority: High  Note:   Current Barriers:  Knowledge Deficits related to plan of care for management of HTN, DM II, HLD  Chronic Disease Management support and education needs related to HTN, DM II, HLD   RNCM Clinical Goal(s):  Patient will verbalize basic understanding of  HTN, DM II, HLD disease process and self health management plan as evidenced by patient will report having no disease exacerbations related to her chronic disease states listed above take all medications exactly as prescribed and will call provider for medication related questions as evidenced by patient will report having no misses doses of her prescribed medications demonstrate Ongoing health management independence as evidenced by patient will report 100% adherence to her prescribed treatment plan  continue to work with RN Care Manager to address care management  and care coordination needs related to  HTN, DM II, HLD as evidenced by adherence to CM Team Scheduled appointments demonstrate ongoing self health care management ability   as evidenced by    through collaboration with RN Care manager, provider, and care team.   Interventions: 1:1 collaboration with primary care provider regarding development and update of comprehensive plan of care as evidenced by provider attestation and co-signature Inter-disciplinary care team collaboration (see longitudinal plan of care) Evaluation of current treatment plan related to  self management and patient's adherence to plan as established by provider  Diabetes Interventions:  (Status:  Goal on track:  Yes.) Long Term Goal Assessed patient's understanding of A1c goal: <6.5% Provided education to patient about basic DM disease process Reviewed medications with patient and discussed importance of medication adherence Counseled on importance of regular laboratory monitoring as prescribed Provided patient with written educational materials related to hypo and hyperglycemia and importance of correct treatment Advised patient, providing education and rationale, to check cbg daily before meals  and record, calling PCP and or RN CM  for findings outside established parameters Review of patient status, including review of consultants reports, relevant laboratory and other test results, and medications completed Assessed social determinant of health barriers Discussed plans with patient for ongoing care management follow up and provided patient with direct contact information for care management team Lab Results  Component Value Date   HGBA1C 6.8 (H) 12/30/2020  Hypertension Interventions:  (Status:  Goal on track:  Yes.) Long  Term Goal Last practice recorded BP readings:  BP Readings from Last 3 Encounters:  12/30/20 122/80  12/06/20 (!) 148/72  10/12/20 (!) 158/82  Most recent eGFR/CrCl:  Lab Results  Component  Value Date   EGFR 77 12/30/2020    No components found for: CRCL  Evaluation of current treatment plan related to hypertension self management and patient's adherence to plan as established by provider Reviewed medications with patient and discussed importance of compliance Counseled on the importance of exercise goals with target of 150 minutes per week Advised patient, providing education and rationale, to monitor blood pressure daily and record, calling PCP for findings outside established parameters Provided education on prescribed diet low Sodium Discussed complications of poorly controlled blood pressure such as heart disease, stroke, circulatory complications, vision complications, kidney impairment, sexual dysfunction Discussed plans with patient for ongoing care management follow up and provided patient with direct contact information for care management team  Patient Goals/Self-Care Activities: Take all medications as prescribed Attend all scheduled provider appointments Call pharmacy for medication refills 3-7 days in advance of running out of medications Perform all self care activities independently  Perform IADL's (shopping, preparing meals, housekeeping, managing finances) independently Call provider office for new concerns or questions  drink 6 to 8 glasses of water each day eat fish at least once per week fill half of plate with vegetables manage portion size call doctor for signs and symptoms of high blood pressure keep all doctor appointments take medications for blood pressure exactly as prescribed report new symptoms to your doctor  Follow Up Plan:  Telephone follow up appointment with care management team member scheduled for:  04/04/21      Consent to CCM Services: Michelle Hicks was given information about Chronic Care Management services including:  CCM service includes personalized support from designated clinical staff supervised by her physician, including  individualized plan of care and coordination with other care providers 24/7 contact phone numbers for assistance for urgent and routine care needs. Service will only be billed when office clinical staff spend 20 minutes or more in a month to coordinate care. Only one practitioner may furnish and bill the service in a calendar month. The patient may stop CCM services at any time (effective at the end of the month) by phone call to the office staff. The patient will be responsible for cost sharing (co-pay) of up to 20% of the service fee (after annual deductible is met).  Patient agreed to services and verbal consent obtained.   Patient verbalizes understanding of instructions provided today and agrees to view in [place].   Telephone follow up appointment with care management team member scheduled for: 04/04/21

## 2021-02-09 ENCOUNTER — Telehealth: Payer: Self-pay

## 2021-02-09 NOTE — Chronic Care Management (AMB) (Signed)
° ° °  Chronic Care Management Pharmacy Assistant   Name: Michelle Hicks  MRN: [id number] DOB: 10-10-1950  Reason for Encounter:Reschedule appointment   Medications: Outpatient Encounter Medications as of 02/09/2021  Medication Sig   aspirin EC 81 MG tablet Take 81 mg by mouth daily. Some times   Cholecalciferol (VITAMIN D-3 PO) Take 2,000 Units by mouth daily.    hydrochlorothiazide (HYDRODIURIL) 25 MG tablet Take 1 tablet (25 mg total) by mouth daily.   Lancets Misc. (ONE TOUCH SURESOFT) MISC Use as directed to check blood sugars 2 times per day dx e11.65   levothyroxine (SYNTHROID) 50 MCG tablet Take 50 mcg by mouth daily.    meloxicam (MOBIC) 15 MG tablet TAKE 1 TABLET BY MOUTH  DAILY AS NEEDED   Multiple Vitamin (MULTIVITAMIN) tablet Take 1 tablet by mouth daily.   omeprazole (PRILOSEC) 40 MG capsule TAKE 1 CAPSULE BY MOUTH  DAILY BEFORE A MEAL   ONETOUCH ULTRA test strip USE AS DIRECTED TO CHECK  BLOOD SUGAR TWICE DAILY   pravastatin (PRAVACHOL) 40 MG tablet Take 1 tablet (40 mg total) by mouth daily.   Semaglutide, 1 MG/DOSE, (OZEMPIC, 1 MG/DOSE,) 2 MG/1.5ML SOPN Inject 1 mg into the skin once a week.   telmisartan (MICARDIS) 80 MG tablet TAKE 1 TABLET BY MOUTH  DAILY   traZODone (DESYREL) 50 MG tablet Take 1 tablet (50 mg total) by mouth at bedtime as needed for sleep.   No facility-administered encounter medications on file as of 02/09/2021.   02-09-2021: Called patient to reschedule telephone appointment with Orlando Penner CPP on 02-24-2021. Left patient voicemail to reschedule. Appointment canceled.  [place] Pharmacist Assistant [phone]

## 2021-02-12 DIAGNOSIS — E782 Mixed hyperlipidemia: Secondary | ICD-10-CM

## 2021-02-12 DIAGNOSIS — I10 Essential (primary) hypertension: Secondary | ICD-10-CM

## 2021-02-12 DIAGNOSIS — E1165 Type 2 diabetes mellitus with hyperglycemia: Secondary | ICD-10-CM

## 2021-02-15 ENCOUNTER — Encounter: Payer: Self-pay | Admitting: Internal Medicine

## 2021-02-24 ENCOUNTER — Telehealth: Payer: Medicare Other

## 2021-02-25 ENCOUNTER — Ambulatory Visit (INDEPENDENT_AMBULATORY_CARE_PROVIDER_SITE_OTHER): Payer: Medicare Other

## 2021-02-25 ENCOUNTER — Other Ambulatory Visit: Payer: Self-pay | Admitting: Internal Medicine

## 2021-02-25 DIAGNOSIS — E1165 Type 2 diabetes mellitus with hyperglycemia: Secondary | ICD-10-CM

## 2021-02-25 DIAGNOSIS — G4733 Obstructive sleep apnea (adult) (pediatric): Secondary | ICD-10-CM | POA: Diagnosis not present

## 2021-02-25 DIAGNOSIS — E782 Mixed hyperlipidemia: Secondary | ICD-10-CM

## 2021-02-25 NOTE — Progress Notes (Signed)
Chronic Care Management Pharmacy Note  03/11/2021 Name:  Michelle Hicks MRN:  [id number] DOB:  Sep 26, 1950  Summary: Patient reports that she is doing well and taking her medication   Recommendations/Changes made from today's visit: Recommend patients cholesterol medication be increased.   Plan: At this time, patient would like to focus on making different lifestyle changes    Subjective: Michelle Hicks is an 71 y.o. year old female who is a primary patient of Glendale Chard, MD.  The CCM team was consulted for assistance with disease management and care coordination needs.    Engaged with patient by telephone for follow up visit in response to provider referral for pharmacy case management and/or care coordination services.   Consent to Services:  The patient was given information about Chronic Care Management services, agreed to services, and gave verbal consent prior to initiation of services.  Please see initial visit note for detailed documentation.   Patient Care Team: Glendale Chard, MD as PCP - General (Internal Medicine) Mayford Knife, [facility] (Pharmacist)  Recent office visits: 12/30/2020 PCP OV 10/06/2020 PCP OV  Recent consult visits: 12/06/2020 Rheumatology OV 10/12/2020 Neurology OV  10/07/2020 [facility] visits: None in previous 6 months   Objective:  Lab Results  Component Value Date   CREATININE 0.83 12/30/2020   BUN 12 12/30/2020   GFRNONAA 73 03/24/2020   GFRAA 84 03/24/2020   NA 141 12/30/2020   K 4.6 12/30/2020   CALCIUM 10.1 12/30/2020   CO2 26 12/30/2020   GLUCOSE 93 12/30/2020    Lab Results  Component Value Date/Time   HGBA1C 6.8 (H) 12/30/2020 02:51 PM   HGBA1C 8.0 (H) 10/06/2020 11:17 AM   HGBA1C 7.3 09/04/2017 12:00 AM   MICROALBUR 10 07/06/2020 12:47 PM   MICROALBUR 30 03/24/2020 10:27 AM    Last diabetic Eye exam:  Lab Results  Component Value Date/Time   HMDIABEYEEXA No Retinopathy 01/11/2021 12:00 AM     Last diabetic Foot exam: No results found for: HMDIABFOOTEX   Lab Results  Component Value Date   CHOL 168 07/06/2020   HDL 67 07/06/2020   LDLCALC 89 07/06/2020   TRIG 61 07/06/2020   CHOLHDL 2.5 07/06/2020    Hepatic Function Latest Ref Rng & Units 12/30/2020 07/06/2020 12/03/2019  Total Protein 6.0 - 8.5 g/dL 7.9 7.8 7.6  Albumin 3.8 - 4.8 g/dL 4.9(H) 4.5 4.3  AST 0 - 40 IU/L 35 31 30  ALT 0 - 32 IU/L 37(H) 32 35(H)  Alk Phosphatase 44 - 121 IU/L 90 90 93  Total Bilirubin 0.0 - 1.2 mg/dL 0.3 0.3 0.2    Lab Results  Component Value Date/Time   TSH 1.020 10/06/2020 11:17 AM   TSH 1.600 03/24/2020 10:34 AM   FREET4 1.12 10/06/2020 11:17 AM   FREET4 1.17 06/11/2018 09:44 AM    CBC Latest Ref Rng & Units 07/06/2020 03/18/2019 09/10/2018  WBC 3.4 - 10.8 x10E3/uL 6.6 7.5 6.8  Hemoglobin 11.1 - 15.9 g/dL 11.1 10.3(L) 11.2  Hematocrit 34.0 - 46.6 % 33.5(L) 31.1(L) 34.5  Platelets 150 - 450 x10E3/uL 312 266 283    No results found for: VD25OH  Clinical ASCVD: No  The 10-year ASCVD risk score (Arnett DK, et al., 2019) is: 25.7%   Values used to calculate the score:     Age: 47 years     Sex: Female     Is Non-Hispanic African American: Yes     Diabetic: Yes  Tobacco smoker: No     Systolic Blood Pressure: 865 mmHg     Is BP treated: Yes     HDL Cholesterol: 67 mg/dL     Total Cholesterol: 168 mg/dL    Depression screen [facility] 2/9 03/24/2020 07/10/2019 04/15/2019  Decreased Interest 0 0 0  Down, Depressed, Hopeless 0 0 0  PHQ - 2 Score 0 0 0  Altered sleeping - 3 -  Tired, decreased energy - 0 -  Change in appetite - 0 -  Feeling bad or failure about yourself  - 0 -  Trouble concentrating - 0 -  Moving slowly or fidgety/restless - 0 -  Suicidal thoughts - 0 -  PHQ-9 Score - 3 -  Difficult doing work/chores - Not difficult at all -     Social History   Tobacco Use  Smoking Status Former   Packs/day: 0.50   Years: 15.00   Pack years: 7.50   Types: Cigarettes    Quit date: 2002   Years since quitting: 21.0  Smokeless Tobacco Never   BP Readings from Last 3 Encounters:  12/30/20 122/80  12/06/20 (!) 148/72  10/12/20 (!) 158/82   Pulse Readings from Last 3 Encounters:  12/30/20 85  12/06/20 80  10/12/20 85   Wt Readings from Last 3 Encounters:  12/30/20 242 lb 12.8 oz (110.1 kg)  12/06/20 253 lb (114.8 kg)  10/12/20 251 lb (113.9 kg)   BMI Readings from Last 3 Encounters:  12/30/20 41.68 kg/m  12/06/20 43.43 kg/m  10/12/20 41.77 kg/m    Assessment/Interventions: Review of patient past medical history, allergies, medications, health status, including review of consultants reports, laboratory and other test data, was performed as part of comprehensive evaluation and provision of chronic care management services.   SDOH:  (Social Determinants of Health) assessments and interventions performed: No  SDOH Screenings   Alcohol Screen: Not on file  Depression (PHQ2-9): Low Risk    PHQ-2 Score: 0  Financial Resource Strain: High Risk   Difficulty of Paying Living Expenses: Very hard  Food Insecurity: No Food Insecurity   Worried About Charity fundraiser in the Last Year: Never true   Ran Out of Food in the Last Year: Never true  Housing: Not on file  Physical Activity: Insufficiently Active   Days of Exercise per Week: 5 days   Minutes of Exercise per Session: 20 min  Social Connections: Not on file  Stress: No Stress Concern Present   Feeling of Stress : Not at all  Tobacco Use: Medium Risk   Smoking Tobacco Use: Former   Smokeless Tobacco Use: Never   Passive Exposure: Not on file  Transportation Needs: No Transportation Needs   Lack of Transportation (Medical): No   Lack of Transportation (Non-Medical): No    CCM Care Plan  Allergies  Allergen Reactions   Sulfa Antibiotics    Sulfamethoxazole-Trimethoprim     Other reaction(s): Unknown   Other Rash    Medications Reviewed Today     Reviewed by Mayford Knife,  RPH (Pharmacist) on 02/25/21 at 1418  Med List Status: <None>   Medication Order Taking? Sig Documenting Provider Last Dose Status Informant  aspirin EC 81 MG tablet [id number] No Take 81 mg by mouth daily. Some times [provider] Taking Active Self  Cholecalciferol (VITAMIN D-3 PO) [id number] No Take 2,000 Units by mouth daily.  [provider] Taking Active   hydrochlorothiazide (HYDRODIURIL) 25 MG tablet [id number] No Take 1 tablet (25  mg total) by mouth daily. Glendale Chard, MD Taking Active   Lancets Misc. (ONE TOUCH SURESOFT) [place] [id number] No Use as directed to check blood sugars 2 times per day dx e11.65 Glendale Chard, MD Taking Active   levothyroxine (SYNTHROID) 50 MCG tablet [id number] No Take 50 mcg by mouth daily.  [provider] Taking Active   meloxicam (MOBIC) 15 MG tablet [id number] No TAKE 1 TABLET BY MOUTH  DAILY AS NEEDED Glendale Chard, MD Taking Active   Multiple Vitamin (MULTIVITAMIN) tablet [id number] No Take 1 tablet by mouth daily. [provider] Taking Active   omeprazole (PRILOSEC) 40 MG capsule [id number] No TAKE 1 CAPSULE BY MOUTH  DAILY BEFORE A MEAL Glendale Chard, MD Taking Active   [facility] ULTRA test strip [id number] No USE AS DIRECTED TO CHECK  BLOOD SUGAR TWICE DAILY Glendale Chard, MD Taking Active   pravastatin (PRAVACHOL) 40 MG tablet [id number]  TAKE 1 TABLET BY MOUTH  DAILY Glendale Chard, MD  Active   Semaglutide, 1 MG/DOSE, (OZEMPIC, 1 MG/DOSE,) 2 MG/1.5ML SOPN [id number] No Inject 1 mg into the skin once a week. Glendale Chard, MD Taking Active   telmisartan (MICARDIS) 80 MG tablet [id number] No TAKE 1 TABLET BY MOUTH  DAILY Glendale Chard, MD Taking Active   traZODone (DESYREL) 50 MG tablet [id number]  Take 1 tablet (50 mg total) by mouth at bedtime as needed for sleep. Glendale Chard, MD  Active             Patient Active Problem List   Diagnosis Date Noted   Attention deficit hyperactivity disorder, predominantly  inattentive type 12/06/2020   History of anemia 12/06/2020   Hyperglycemia due to type 2 diabetes mellitus ([place]) 12/06/2020   Hypothyroidism 12/06/2020   Menopausal symptom 12/06/2020   Non-toxic goiter 12/06/2020   Pure hypercholesterolemia 12/06/2020   Positive ANA (antinuclear antibody) 12/06/2020   Pain in left knee 12/06/2020   Chronic insomnia 04/26/2020   Snoring 04/26/2020   Class 3 severe obesity due to excess calories with serious comorbidity and body mass index (BMI) of 40.0 to 44.9 in adult ([place]) 03/18/2019   Essential hypertension, benign 11/13/2017   Gastroesophageal reflux disease without esophagitis 11/13/2017   Hypertension 09/04/2017   Type II diabetes mellitus, uncontrolled 09/04/2017   S/P abdominal hysterectomy and left salpingo-oophorectomy 08/18/1994    Immunization History  Administered Date(s) Administered   DTaP 02/28/2012   Fluad Quad(high Dose 65+) 11/07/2019   Influenza, High Dose Seasonal PF 11/14/2017, 10/30/2018   Influenza-Unspecified 11/14/2017, 10/30/2018, 11/10/2020   Moderna Sars-Covid-2 Vaccination 03/28/2019, 04/25/2019, 02/03/2020, 07/04/2020, 11/10/2020   Pneumococcal Conjugate-13 04/12/2018   Pneumococcal Polysaccharide-23 12/30/2020   Pneumococcal-Unspecified 08/06/2013   Zoster Recombinat (Shingrix) 11/14/2018, 07/29/2019    Conditions to be addressed/monitored:  Hypertension and Diabetes  Care Plan : [place]  Updates made by Mayford Knife, [place] since 03/11/2021 12:00 AM     Problem: HTN, DM II   Priority: High     Long-Range Goal: Disease Management   Recent Progress: On track  Priority: High  Note:   Current Barriers:  Unable to achieve control of hyperlipidemia   Pharmacist Clinical Goal(s):  Patient will achieve adherence to monitoring guidelines and medication adherence to achieve therapeutic efficacy through collaboration with PharmD and provider.   Interventions: 1:1 collaboration with Glendale Chard, MD regarding development and update of comprehensive plan of care as evidenced by provider attestation and co-signature Inter-disciplinary care team collaboration (see longitudinal plan of care) Comprehensive medication review  performed; medication list updated in electronic medical record  Diabetes (A1c goal <7%) -Controlled -Current medications: Ozempic 1 mg- inject once per week Appropriate, Effective, Safe, Accessible -Current home glucose readings fasting glucose: patient reports that her BS have been good  -Denies hypoglycemic/hyperglycemic symptoms -Current meal patterns: patient reports that she deviated from her eating habits from the holidays  breakfast: scrambled egg, and hash browns   -Current exercise: please see hyperlipidemia with more details  -Educated on Complications of diabetes including kidney damage, retinal damage, and cardiovascular disease; Exercise goal of 150 minutes per week; -Counseled to check feet dailA1c and blood sugar goals; Exercise goal of 150 minutes per week;y and get yearly eye exams -Recommended to continue current medication  Hyperlipidemia: (LDL goal < 70) -Not ideally controlled -Current treatment: Pravastatin 40 mg tablet by mouth daily. Appropriate, Query effective -Current dietary patterns: she is going to work on limit her high fat and fried foods.  -Current exercise habits: she is doing more stretches , through insurance she is doing videos for exercise. She is stretching and and getting her steps in.  -Educated on Cholesterol goals;  Benefits of statin for ASCVD risk reduction; Importance of limiting foods high in cholesterol; -Recommended to continue current medication  Patient Goals/Self-Care Activities Patient will:  - take medications as prescribed as evidenced by patient report and record review  Follow Up Plan: The patient has been provided with contact information for the care management team and has been advised to  call with any health related questions or concerns.       Medication Assistance: Application for Novo nordisk  medication assistance program. in process.  Anticipated assistance start date 03/2021.  See plan of care for additional detail.  Compliance/Adherence/Medication fill history: Care Gaps: COVID-19 Booster   Star-Rating Drugs: Pravastatin 40 mg tablet  Ozempic 1 mg/ dose  Telmisartan 80 mg tablet   Patient's preferred pharmacy is:  [facility] [employer] ([place], [facility] [place] [place] [address] [place] [address] Phone: [phone] Fax: [phone]  [facility] ([facility] ) - [place], [address] [place] [address] Phone: [phone] Fax: [place], [place] [address] Phone: [phone] Fax: [phone]  Uses pill box? Yes Pt endorses 95% compliance  We discussed: Benefits of medication synchronization, packaging and delivery as well as enhanced pharmacist oversight with [employer]. Patient decided to: Continue current medication management strategy  Care Plan and Follow Up Patient Decision:  Patient agrees to Care Plan and Follow-up.  Plan: The patient has been provided with contact information for the care management team and has been advised to call with any health related questions or concerns.   Orlando Penner, CPP, PharmD Clinical Pharmacist Practitioner [facility] [phone]

## 2021-03-11 NOTE — Patient Instructions (Signed)
Visit Information It was great speaking with you today!  Please let me know if you have any questions about our visit.   Goals Addressed             This Visit's Progress    Manage My Medicine       Timeframe:  Long-Range Goal Priority:  High Start Date:                             Expected End Date:                       Follow Up Date 08/24/2021  In Progress:   - call for medicine refill 2 or 3 days before it runs out - call if I am sick and can't take my medicine - keep a list of all the medicines I take; vitamins and herbals too - use an alarm clock or phone to remind me to take my medicine    Why is this important?   These steps will help you keep on track with your medicines.   Notes:  Please call if you have any questions        Patient Care Plan: CCM Pharmacy Care Plan     Problem Identified:  DM II, HLD   Priority: High     Long-Range Goal: Disease Management   Recent Progress: On track  Priority: High  Note:   Current Barriers:  Unable to achieve control of hyperlipidemia   Pharmacist Clinical Goal(s):  Patient will achieve adherence to monitoring guidelines and medication adherence to achieve therapeutic efficacy through collaboration with PharmD and provider.   Interventions: 1:1 collaboration with Glendale Chard, MD regarding development and update of comprehensive plan of care as evidenced by provider attestation and co-signature Inter-disciplinary care team collaboration (see longitudinal plan of care) Comprehensive medication review performed; medication list updated in electronic medical record  Diabetes (A1c goal <7%) -Controlled -Current medications: Ozempic 1 mg- inject once per week Appropriate, Effective, Safe, Accessible -Current home glucose readings fasting glucose: patient reports that her BS have been good  -Denies hypoglycemic/hyperglycemic symptoms -Current meal patterns: patient reports that she deviated from her eating  habits from the holidays  breakfast: scrambled egg, and hash browns   -Current exercise: please see hyperlipidemia with more details  -Educated on Complications of diabetes including kidney damage, retinal damage, and cardiovascular disease; Exercise goal of 150 minutes per week; -Counseled to check feet dailA1c and blood sugar goals; Exercise goal of 150 minutes per week;y and get yearly eye exams -Recommended to continue current medication  Hyperlipidemia: (LDL goal < 70) -Not ideally controlled -Current treatment: Pravastatin 40 mg tablet by mouth daily. Appropriate, Query effective -Current dietary patterns: she is going to work on limit her high fat and fried foods.  -Current exercise habits: she is doing more stretches , through insurance she is doing videos for exercise. She is stretching and and getting her steps in.  -Educated on Cholesterol goals;  Benefits of statin for ASCVD risk reduction; Importance of limiting foods high in cholesterol; -Recommended to continue current medication  Patient Goals/Self-Care Activities Patient will:  - take medications as prescribed as evidenced by patient report and record review  Follow Up Plan: The patient has been provided with contact information for the care management team and has been advised to call with any health related questions or concerns.      Patient agreed to  services and verbal consent obtained.   The patient verbalized understanding of instructions, educational materials, and care plan provided today and agreed to receive a mailed copy of patient instructions, educational materials, and care plan.   Orlando Penner, PharmD Clinical Pharmacist [facility] [phone]

## 2021-03-15 DIAGNOSIS — E782 Mixed hyperlipidemia: Secondary | ICD-10-CM

## 2021-03-15 DIAGNOSIS — E1165 Type 2 diabetes mellitus with hyperglycemia: Secondary | ICD-10-CM

## 2021-03-21 DIAGNOSIS — G4733 Obstructive sleep apnea (adult) (pediatric): Secondary | ICD-10-CM | POA: Diagnosis not present

## 2021-03-28 ENCOUNTER — Other Ambulatory Visit: Payer: Self-pay | Admitting: Internal Medicine

## 2021-03-28 DIAGNOSIS — G4733 Obstructive sleep apnea (adult) (pediatric): Secondary | ICD-10-CM | POA: Diagnosis not present

## 2021-03-31 ENCOUNTER — Ambulatory Visit: Payer: Medicare Other | Admitting: Internal Medicine

## 2021-03-31 ENCOUNTER — Encounter: Payer: Self-pay | Admitting: Internal Medicine

## 2021-03-31 ENCOUNTER — Ambulatory Visit (INDEPENDENT_AMBULATORY_CARE_PROVIDER_SITE_OTHER): Payer: Medicare Other | Admitting: Internal Medicine

## 2021-03-31 ENCOUNTER — Ambulatory Visit (INDEPENDENT_AMBULATORY_CARE_PROVIDER_SITE_OTHER): Payer: Medicare Other

## 2021-03-31 ENCOUNTER — Other Ambulatory Visit: Payer: Self-pay

## 2021-03-31 VITALS — BP 130/60 | HR 100 | Temp 98.1°F | Ht 64.2 in | Wt 245.0 lb

## 2021-03-31 VITALS — BP 120/82 | HR 100 | Temp 98.1°F | Ht 64.2 in | Wt 244.9 lb

## 2021-03-31 DIAGNOSIS — Z0000 Encounter for general adult medical examination without abnormal findings: Secondary | ICD-10-CM

## 2021-03-31 DIAGNOSIS — R0602 Shortness of breath: Secondary | ICD-10-CM | POA: Diagnosis not present

## 2021-03-31 DIAGNOSIS — E1165 Type 2 diabetes mellitus with hyperglycemia: Secondary | ICD-10-CM

## 2021-03-31 DIAGNOSIS — K5909 Other constipation: Secondary | ICD-10-CM

## 2021-03-31 DIAGNOSIS — R2231 Localized swelling, mass and lump, right upper limb: Secondary | ICD-10-CM | POA: Diagnosis not present

## 2021-03-31 DIAGNOSIS — M791 Myalgia, unspecified site: Secondary | ICD-10-CM

## 2021-03-31 DIAGNOSIS — Z6841 Body Mass Index (BMI) 40.0 and over, adult: Secondary | ICD-10-CM

## 2021-03-31 DIAGNOSIS — I10 Essential (primary) hypertension: Secondary | ICD-10-CM

## 2021-03-31 MED ORDER — DOXYCYCLINE HYCLATE 100 MG PO TABS
100.0000 mg | ORAL_TABLET | Freq: Two times a day (BID) | ORAL | 0 refills | Status: DC
Start: 2021-03-31 — End: 2021-05-26

## 2021-03-31 MED ORDER — OZEMPIC (1 MG/DOSE) 2 MG/1.5ML ~~LOC~~ SOPN: 1.0000 mg | PEN_INJECTOR | SUBCUTANEOUS | 3 refills | Status: DC

## 2021-03-31 NOTE — Patient Instructions (Signed)

## 2021-03-31 NOTE — Patient Instructions (Signed)
Michelle Hicks , Thank you for taking time to come for your Medicare Wellness Visit. I appreciate your ongoing commitment to your health goals. Please review the following plan we discussed and let me know if I can assist you in the future.   Screening recommendations/referrals: Colonoscopy: completed 12/25/2019 Mammogram: completed 09/17/2020, due 09/18/2021 Bone Density: completed 08/01/2018 Recommended yearly ophthalmology/optometry visit for glaucoma screening and checkup Recommended yearly dental visit for hygiene and checkup  Vaccinations: Influenza vaccine: completed 11/10/2020, due next flu season Pneumococcal vaccine: completed 12/30/2020 Tdap vaccine: completed 02/28/2012, due 02/27/2022 Shingles vaccine: completed   Covid-19: 11/10/2020, 07/04/2020, 02/03/2020, 04/25/2019, 03/28/2019  Advanced directives: Advance directive discussed with you today. I have provided a copy for you to complete at home and have notarized. Once this is complete please bring a copy in to our office so we can scan it into your chart.  Conditions/risks identified: none  Next appointment: Follow up in one year for your annual wellness visit    Preventive Care 65 Years and Older, Female Preventive care refers to lifestyle choices and visits with your health care provider that can promote health and wellness. What does preventive care include? A yearly physical exam. This is also called an annual well check. Dental exams once or twice a year. Routine eye exams. Ask your health care provider how often you should have your eyes checked. Personal lifestyle choices, including: Daily care of your teeth and gums. Regular physical activity. Eating a healthy diet. Avoiding tobacco and drug use. Limiting alcohol use. Practicing safe sex. Taking low-dose aspirin every day. Taking vitamin and mineral supplements as recommended by your health care provider. What happens during an annual well check? The services and  screenings done by your health care provider during your annual well check will depend on your age, overall health, lifestyle risk factors, and family history of disease. Counseling  Your health care provider may ask you questions about your: Alcohol use. Tobacco use. Drug use. Emotional well-being. Home and relationship well-being. Sexual activity. Eating habits. History of falls. Memory and ability to understand (cognition). Work and work Statistician. Reproductive health. Screening  You may have the following tests or measurements: Height, weight, and BMI. Blood pressure. Lipid and cholesterol levels. These may be checked every 5 years, or more frequently if you are over 68 years old. Skin check. Lung cancer screening. You may have this screening every year starting at age 72 if you have a 30-pack-year history of smoking and currently smoke or have quit within the past 15 years. Fecal occult blood test (FOBT) of the stool. You may have this test every year starting at age 37. Flexible sigmoidoscopy or colonoscopy. You may have a sigmoidoscopy every 5 years or a colonoscopy every 10 years starting at age 51. Hepatitis C blood test. Hepatitis B blood test. Sexually transmitted disease (STD) testing. Diabetes screening. This is done by checking your blood sugar (glucose) after you have not eaten for a while (fasting). You may have this done every 1-3 years. Bone density scan. This is done to screen for osteoporosis. You may have this done starting at age 49. Mammogram. This may be done every 1-2 years. Talk to your health care provider about how often you should have regular mammograms. Talk with your health care provider about your test results, treatment options, and if necessary, the need for more tests. Vaccines  Your health care provider may recommend certain vaccines, such as: Influenza vaccine. This is recommended every year. Tetanus, diphtheria, and  acellular pertussis (Tdap,  Td) vaccine. You may need a Td booster every 10 years. Zoster vaccine. You may need this after age 49. Pneumococcal 13-valent conjugate (PCV13) vaccine. One dose is recommended after age 5. Pneumococcal polysaccharide (PPSV23) vaccine. One dose is recommended after age 33. Talk to your health care provider about which screenings and vaccines you need and how often you need them. This information is not intended to replace advice given to you by your health care provider. Make sure you discuss any questions you have with your health care provider. Document Released: 02/26/2015 Document Revised: 10/20/2015 Document Reviewed: 12/01/2014 Elsevier Interactive Patient Education  2017 [employer] Prevention in the Home Falls can cause injuries. They can happen to people of all ages. There are many things you can do to make your home safe and to help prevent falls. What can I do on the outside of my home? Regularly fix the edges of walkways and driveways and fix any cracks. Remove anything that might make you trip as you walk through a door, such as a raised step or threshold. Trim any bushes or trees on the path to your home. Use bright outdoor lighting. Clear any walking paths of anything that might make someone trip, such as rocks or tools. Regularly check to see if handrails are loose or broken. Make sure that both sides of any steps have handrails. Any raised decks and porches should have guardrails on the edges. Have any leaves, snow, or ice cleared regularly. Use sand or salt on walking paths during winter. Clean up any spills in your garage right away. This includes oil or grease spills. What can I do in the bathroom? Use night lights. Install grab bars by the toilet and in the tub and shower. Do not use towel bars as grab bars. Use non-skid mats or decals in the tub or shower. If you need to sit down in the shower, use a plastic, non-slip stool. Keep the floor dry. Clean up any  water that spills on the floor as soon as it happens. Remove soap buildup in the tub or shower regularly. Attach bath mats securely with double-sided non-slip rug tape. Do not have throw rugs and other things on the floor that can make you trip. What can I do in the bedroom? Use night lights. Make sure that you have a light by your bed that is easy to reach. Do not use any sheets or blankets that are too big for your bed. They should not hang down onto the floor. Have a firm chair that has side arms. You can use this for support while you get dressed. Do not have throw rugs and other things on the floor that can make you trip. What can I do in the kitchen? Clean up any spills right away. Avoid walking on wet floors. Keep items that you use a lot in easy-to-reach places. If you need to reach something above you, use a strong step stool that has a grab bar. Keep electrical cords out of the way. Do not use floor polish or wax that makes floors slippery. If you must use wax, use non-skid floor wax. Do not have throw rugs and other things on the floor that can make you trip. What can I do with my stairs? Do not leave any items on the stairs. Make sure that there are handrails on both sides of the stairs and use them. Fix handrails that are broken or loose. Make sure that  handrails are as long as the stairways. Check any carpeting to make sure that it is firmly attached to the stairs. Fix any carpet that is loose or worn. Avoid having throw rugs at the top or bottom of the stairs. If you do have throw rugs, attach them to the floor with carpet tape. Make sure that you have a light switch at the top of the stairs and the bottom of the stairs. If you do not have them, ask someone to add them for you. What else can I do to help prevent falls? Wear shoes that: Do not have high heels. Have rubber bottoms. Are comfortable and fit you well. Are closed at the toe. Do not wear sandals. If you use a  stepladder: Make sure that it is fully opened. Do not climb a closed stepladder. Make sure that both sides of the stepladder are locked into place. Ask someone to hold it for you, if possible. Clearly mark and make sure that you can see: Any grab bars or handrails. First and last steps. Where the edge of each step is. Use tools that help you move around (mobility aids) if they are needed. These include: Canes. Walkers. Scooters. Crutches. Turn on the lights when you go into a dark area. Replace any light bulbs as soon as they burn out. Set up your furniture so you have a clear path. Avoid moving your furniture around. If any of your floors are uneven, fix them. If there are any pets around you, be aware of where they are. Review your medicines with your doctor. Some medicines can make you feel dizzy. This can increase your chance of falling. Ask your doctor what other things that you can do to help prevent falls. This information is not intended to replace advice given to you by your health care provider. Make sure you discuss any questions you have with your health care provider. Document Released: 11/26/2008 Document Revised: 07/08/2015 Document Reviewed: 03/06/2014 Elsevier Interactive Patient Education  2017 [employer].

## 2021-03-31 NOTE — Progress Notes (Signed)
Rich Brave [place],acting as a Education administrator for [name], MD.,have documented all relevant documentation on the behalf of [name], MD,as directed by  [name], MD while in the presence of [name], MD.  This visit occurred during the SARS-CoV-2 public health emergency.  Safety protocols were in place, including screening questions prior to the visit, additional usage of staff PPE, and extensive cleaning of exam room while observing appropriate contact time as indicated for disinfecting solutions.  Subjective:     Patient ID: Michelle Hicks , female    DOB: 05/09/1950 , 71 y.o.   MRN: [id number]   Chief Complaint  Patient presents with   Diabetes   Hypertension    HPI  The patient is here today for a follow-up on her diabetes and blood pressure.  She reports compliance with meds.  She denies headaches. However, she does admit to being more short of breath. She has noticed that she is more SOB with routine daily tasks. She admits she is not exercising on a regular basis.   Diabetes She presents for her follow-up diabetic visit. She has type 2 diabetes mellitus. Her disease course has been stable. There are no hypoglycemic associated symptoms. There are no diabetic associated symptoms. Pertinent negatives for diabetes include no blurred vision, no chest pain, no polydipsia, no polyphagia and no polyuria. There are no hypoglycemic complications. Symptoms are stable. Risk factors for coronary artery disease include diabetes mellitus, obesity, sedentary lifestyle and post-menopausal. She is compliant with treatment most of the time. She is following a generally healthy diet. She participates in exercise intermittently. An ACE inhibitor/angiotensin II receptor blocker is being taken. Eye exam is current.  Hypertension This is a chronic problem. The current episode started more than 1 year ago. The problem has been gradually improving since onset. The problem is controlled.  Associated symptoms include shortness of breath. Pertinent negatives include no blurred vision or chest pain. The current treatment provides moderate improvement.    Past Medical History:  Diagnosis Date   Depression    DM (diabetes mellitus) ([place])    HTN (hypertension)      Family History  Problem Relation Age of Onset   Diabetes Mother    Pancreatic cancer Mother    Hypertension Father    Heart attack Father      Current Outpatient Medications:    doxycycline (VIBRA-TABS) 100 MG tablet, Take 1 tablet (100 mg total) by mouth 2 (two) times daily., Disp: 20 tablet, Rfl: 0   aspirin EC 81 MG tablet, Take 81 mg by mouth daily. Some times, Disp: , Rfl:    Cholecalciferol (VITAMIN D-3 PO), Take 2,000 Units by mouth daily. , Disp: , Rfl:    hydrochlorothiazide (HYDRODIURIL) 25 MG tablet, Take 1 tablet (25 mg total) by mouth daily., Disp: 90 tablet, Rfl: 3   Lancets Misc. (ONE TOUCH SURESOFT) MISC, Use as directed to check blood sugars 2 times per day dx e11.65, Disp: 300 each, Rfl: 2   levothyroxine (SYNTHROID) 50 MCG tablet, Take 50 mcg by mouth daily. , Disp: , Rfl:    LINZESS 290 MCG CAPS capsule, Take 290 mcg by mouth every morning., Disp: , Rfl:    meloxicam (MOBIC) 15 MG tablet, TAKE 1 TABLET BY MOUTH  DAILY AS NEEDED, Disp: 90 tablet, Rfl: 1   Multiple Vitamin (MULTIVITAMIN) tablet, Take 1 tablet by mouth daily., Disp: , Rfl:    omeprazole (PRILOSEC) 40 MG capsule, TAKE 1 CAPSULE BY MOUTH  DAILY BEFORE A MEAL, Disp: 90 capsule, Rfl: 2   ONETOUCH ULTRA test strip, USE AS DIRECTED TO CHECK  BLOOD SUGAR TWICE DAILY, Disp: 200 strip, Rfl: 3   pravastatin (PRAVACHOL) 40 MG tablet, TAKE 1 TABLET BY MOUTH  DAILY, Disp: 90 tablet, Rfl: 3   Semaglutide, 1 MG/DOSE, (OZEMPIC, 1 MG/DOSE,) 2 MG/1.5ML SOPN, Inject 1 mg into the skin once a week., Disp: 4.5 mL, Rfl: 3   telmisartan (MICARDIS) 80 MG tablet, TAKE 1 TABLET BY MOUTH  DAILY, Disp: 90 tablet, Rfl: 3   traZODone (DESYREL) 50 MG tablet,  Take 1 tablet (50 mg total) by mouth at bedtime as needed for sleep., Disp: 90 tablet, Rfl: 1   Allergies  Allergen Reactions   Sulfa Antibiotics    Sulfamethoxazole-Trimethoprim     Other reaction(s): Unknown   Other Rash     Review of Systems  Constitutional: Negative.   Eyes: Negative.  Negative for blurred vision.  Respiratory:  Positive for shortness of breath.        She c/o increasing SOB. She states her sx have progressed over the past six months. No associated cp, but she does c/o diaphoresis. Usually occurs while doing things around the house.   Cardiovascular: Negative.  Negative for chest pain.  Gastrointestinal: Negative.   Endocrine: Negative for polydipsia, polyphagia and polyuria.  Musculoskeletal:  Positive for myalgias.  Skin: Negative.        She c/o knot underneath her right armpit. Feels tender, and getting larger.   Neurological: Negative.   Psychiatric/Behavioral: Negative.      Today's Vitals   03/31/21 0955 03/31/21 1023  BP: (!) 150/70 120/82  Pulse: 100   Temp: 98.1 F (36.7 C)   Weight: 244 lb 14.9 oz (111.1 kg)   Height: 5' 4.2" (1.631 m)    Body mass index is 41.78 kg/m.  Wt Readings from Last 3 Encounters:  03/31/21 244 lb 14.9 oz (111.1 kg)  03/31/21 245 lb (111.1 kg)  12/30/20 242 lb 12.8 oz (110.1 kg)     Objective:  Physical Exam Vitals and nursing note reviewed.  Constitutional:      Appearance: Normal appearance. She is obese.  HENT:     Head: Normocephalic and atraumatic.     Nose:     Comments: Masked     Mouth/Throat:     Comments: Masked  Eyes:     Extraocular Movements: Extraocular movements intact.  Cardiovascular:     Rate and Rhythm: Normal rate and regular rhythm.     Heart sounds: Normal heart sounds.  Pulmonary:     Effort: Pulmonary effort is normal.     Breath sounds: Normal breath sounds.  Musculoskeletal:     Cervical back: Normal range of motion.  Skin:    General: Skin is warm.  Neurological:      General: No focal deficit present.     Mental Status: She is alert.  Psychiatric:        Mood and Affect: Mood normal.        Behavior: Behavior normal.        Assessment And Plan:     1. Uncontrolled type 2 diabetes mellitus with hyperglycemia (HCC) Comments: Chronic, I will check labs as below. Importance of dietary/medication compliance was d/w patient.  - CMP14+EGFR - Hemoglobin A1c - Lipid panel  2. Essential hypertension, benign Comments: Chronic, well controlled. She is encouraged to follow low sodium diet. No med changes today. - CMP14+EGFR  3. Shortness  of breath Comments: Cardiac risk factors include DM, HTN, obesity, ethnicity, postmenopausal state and family history. EKG performed, NSR w/o acute changes. She agrees to Cardiology referral.  - Ambulatory referral to Cardiology - EKG 12-Lead - CBC no Diff  4. Axillary mass, right Comments: Appears to be early abscess. Will send rx doxy to take twice daily, encouraged to take full abx course.   5. Chronic constipation Comments: She is now on Linzess 256m as per GI. She is reminded to stay well hydrated while on Linzess.   6. Myalgia Comments: I will check CPK today. She is reminded to stay well hydrated. She may benefit from Mg supplementation.  - CK, total  7. Class 3 severe obesity due to excess calories with serious comorbidity and body mass index (BMI) of 40.0 to 44.9 in adult ([facility] Comments: BMI 41. She is encouraged to initially strive for BMI <35 to decrease cardiac risk. She will not start any new exercise regimen until Cardio evaluation.    Patient was given opportunity to ask questions. Patient verbalized understanding of the plan and was able to repeat key elements of the plan. All questions were answered to their satisfaction.   I, RMaximino Greenland MD, have reviewed all documentation for this visit. The documentation on 04/01/21 for the exam, diagnosis, procedures, and orders are all accurate and  complete.   IF YOU HAVE BEEN REFERRED TO A SPECIALIST, IT MAY TAKE 1-2 WEEKS TO SCHEDULE/PROCESS THE REFERRAL. IF YOU HAVE NOT HEARD FROM US/SPECIALIST IN TWO WEEKS, PLEASE GIVE [place] CALL AT [phone] X [phone].   THE PATIENT IS ENCOURAGED TO PRACTICE SOCIAL DISTANCING DUE TO THE COVID-19 PANDEMIC.

## 2021-03-31 NOTE — Progress Notes (Signed)
This visit occurred during the SARS-CoV-2 public health emergency.  Safety protocols were in place, including screening questions prior to the visit, additional usage of staff PPE, and extensive cleaning of exam room while observing appropriate contact time as indicated for disinfecting solutions.  Subjective:   Michelle Hicks is a 71 y.o. female who presents for Medicare Annual (Subsequent) preventive examination.  Review of Systems     Cardiac Risk Factors include: advanced age (>80men, >45 women);diabetes mellitus;hypertension;obesity (BMI >30kg/m2)     Objective:    Today's Vitals   03/31/21 0942 03/31/21 0948 03/31/21 1005  BP: (!) 150/70  130/60  Pulse: 100    Temp: 98.1 F (36.7 C)    TempSrc: Oral    SpO2: 99%    Weight: 245 lb (111.1 kg)    Height: 5' 4.2" (1.631 m)    PainSc:  3     Body mass index is 41.79 kg/m.  Advanced Directives 03/31/2021 03/24/2020 07/24/2019 07/10/2019  Does Patient Have a Medical Advance Directive? No No No No  Would patient like information on creating a medical advance directive? Yes (MAU/Ambulatory/Procedural Areas - Information given) No - Patient declined (No Data) Yes (MAU/Ambulatory/Procedural Areas - Information given)    Current Medications (verified) Outpatient Encounter Medications as of 03/31/2021  Medication Sig   aspirin EC 81 MG tablet Take 81 mg by mouth daily. Some times   Cholecalciferol (VITAMIN D-3 PO) Take 2,000 Units by mouth daily.    hydrochlorothiazide (HYDRODIURIL) 25 MG tablet Take 1 tablet (25 mg total) by mouth daily.   Lancets Misc. (ONE TOUCH SURESOFT) MISC Use as directed to check blood sugars 2 times per day dx e11.65   levothyroxine (SYNTHROID) 50 MCG tablet Take 50 mcg by mouth daily.    LINZESS 290 MCG CAPS capsule Take 290 mcg by mouth every morning.   meloxicam (MOBIC) 15 MG tablet TAKE 1 TABLET BY MOUTH  DAILY AS NEEDED   Multiple Vitamin (MULTIVITAMIN) tablet Take 1 tablet by mouth daily.   omeprazole  (PRILOSEC) 40 MG capsule TAKE 1 CAPSULE BY MOUTH  DAILY BEFORE A MEAL   ONETOUCH ULTRA test strip USE AS DIRECTED TO CHECK  BLOOD SUGAR TWICE DAILY   pravastatin (PRAVACHOL) 40 MG tablet TAKE 1 TABLET BY MOUTH  DAILY   Semaglutide, 1 MG/DOSE, (OZEMPIC, 1 MG/DOSE,) 2 MG/1.5ML SOPN Inject 1 mg into the skin once a week.   telmisartan (MICARDIS) 80 MG tablet TAKE 1 TABLET BY MOUTH  DAILY   traZODone (DESYREL) 50 MG tablet Take 1 tablet (50 mg total) by mouth at bedtime as needed for sleep.   No facility-administered encounter medications on file as of 03/31/2021.    Allergies (verified) Sulfa antibiotics, Sulfamethoxazole-trimethoprim, and Other   History: Past Medical History:  Diagnosis Date   Depression    DM (diabetes mellitus) ([place])    HTN (hypertension)    Past Surgical History:  Procedure Laterality Date   ABDOMINAL HYSTERECTOMY     TONSILLECTOMY Bilateral 1967   Family History  Problem Relation Age of Onset   Diabetes Mother    Pancreatic cancer Mother    Hypertension Father    Heart attack Father    Social History   Socioeconomic History   Marital status: Married    Spouse name: Not on file   Number of children: Not on file   Years of education: Not on file   Highest education level: Not on file  Occupational History   Occupation: retired  Tobacco Use  Smoking status: Former    Packs/day: 0.50    Years: 15.00    Pack years: 7.50    Types: Cigarettes    Quit date: 2002    Years since quitting: 21.1    Passive exposure: Past   Smokeless tobacco: Never  Vaping Use   Vaping Use: Never used  Substance and Sexual Activity   Alcohol use: Yes    Comment: socially    Drug use: No   Sexual activity: Not Currently    Partners: Male    Birth control/protection: Surgical  Other Topics Concern   Not on file  Social History Narrative   Not on file   Social Determinants of Health   Financial Resource Strain: Low Risk    Difficulty of Paying Living Expenses:  Not hard at all  Food Insecurity: No Food Insecurity   Worried About Charity fundraiser in the Last Year: Never true   [place] in the Last Year: Never true  Transportation Needs: No Transportation Needs   Lack of Transportation (Medical): No   Lack of Transportation (Non-Medical): No  Physical Activity: Inactive   Days of Exercise per Week: 0 days   Minutes of Exercise per Session: 0 min  Stress: No Stress Concern Present   Feeling of Stress : Only a little  Social Connections: Not on file    Tobacco Counseling Counseling given: Not Answered   Clinical Intake:  Pre-visit preparation completed: Yes  Pain : 0-10 Pain Score: 3  Pain Type: Acute pain Pain Location: Back Pain Orientation: Lower Pain Descriptors / Indicators: Nagging Pain Onset: 1 to 4 weeks ago Pain Frequency: Intermittent     Nutritional Status: BMI > 30  Obese Nutritional Risks: Nausea/ vomitting/ diarrhea (diarrhea regularly takes linzess) Diabetes: Yes  How often do you need to have someone help you when you read instructions, pamphlets, or other written materials from your doctor or pharmacy?: 1 - Never What is the last grade level you completed in school?: 16 yrs  Diabetic? Yes Nutrition Risk Assessment:  Has the patient had any N/V/D within the last 2 months?  Yes  Does the patient have any non-healing wounds?  No  Has the patient had any unintentional weight loss or weight gain?  No   Diabetes:  Is the patient diabetic?  Yes  If diabetic, was a CBG obtained today?  No  Did the patient bring in their glucometer from home?  No  How often do you monitor your CBG's? daily.   Financial Strains and Diabetes Management:  Are you having any financial strains with the device, your supplies or your medication? No .  Does the patient want to be seen by Chronic Care Management for management of their diabetes?  No  Would the patient like to be referred to a Nutritionist or for Diabetic  Management?  No   Diabetic Exams:  Diabetic Eye Exam: Completed 01/11/2021 Diabetic Foot Exam: Completed 07/06/2020   Interpreter Needed?: No  Information entered by :: NAllen LPN   Activities of Daily Living In your present state of health, do you have any difficulty performing the following activities: 03/31/2021 03/28/2021  Hearing? N N  Vision? N N  Difficulty concentrating or making decisions? N N  Walking or climbing stairs? Y Y  Dressing or bathing? N N  Doing errands, shopping? N N  Preparing Food and eating ? N N  Using the Toilet? N N  In the past six months, have you  accidently leaked urine? Y Y  Do you have problems with loss of bowel control? N N  Managing your Medications? N N  Managing your Finances? N N  Housekeeping or managing your Housekeeping? N N  Some recent data might be hidden    Patient Care Team: Glendale Chard, MD as PCP - General (Internal Medicine) Mayford Knife, [facility] (Pharmacist)  Indicate any recent Medical Services you may have received from other than [facility] providers in the past year (date may be approximate).     Assessment:   This is a routine wellness examination for C[place].  Hearing/Vision screen Vision Screening - Comments:: Regular eye exams, [facility]  Dietary issues and exercise activities discussed: Current Exercise Habits: The patient does not participate in regular exercise at present   Goals Addressed             This Visit's Progress    Patient Stated       03/31/2021, get healthier and stay mobile       Depression Screen PHQ 2/9 Scores 03/31/2021 03/24/2020 07/10/2019 04/15/2019 09/10/2018 06/11/2018 02/28/2018  PHQ - 2 Score 0 0 0 0 0 0 0  PHQ- 9 Score - - 3 - - - -    Fall Risk Fall Risk  03/31/2021 03/28/2021 03/24/2020 07/10/2019 04/15/2019  Falls in the past year? 0 0 0 0 0  Number falls in past yr: - 0 - - 0  Injury with Fall? - 0 - - 0  Risk for fall due to : Medication side effect - Medication side  effect Medication side effect -  Follow up Falls evaluation completed;Education provided;Falls prevention discussed - Falls evaluation completed;Education provided;Falls prevention discussed Falls evaluation completed;Education provided;Falls prevention discussed -    FALL RISK PREVENTION PERTAINING TO THE HOME:  Any stairs in or around the home? Yes  If so, are there any without handrails? No  Home free of loose throw rugs in walkways, pet beds, electrical cords, etc? Yes  Adequate lighting in your home to reduce risk of falls? Yes   ASSISTIVE DEVICES UTILIZED TO PREVENT FALLS:  Life alert? No  Use of a cane, walker or w/c? No  Grab bars in the bathroom? Yes  Shower chair or bench in shower? No  Elevated toilet seat or a handicapped toilet? No   TIMED UP AND GO:  Was the test performed? No .     Gait steady and fast without use of assistive device  Cognitive Function:     6CIT Screen 03/31/2021 03/24/2020 07/10/2019  What Year? 0 points 0 points 0 points  What month? 0 points 0 points 0 points  What time? 0 points 0 points 0 points  Count back from 20 0 points 0 points 0 points  Months in reverse 0 points 0 points 0 points  Repeat phrase 2 points 4 points 0 points  Total Score 2 4 0    Immunizations Immunization History  Administered Date(s) Administered   DTaP 02/28/2012   Fluad Quad(high Dose 65+) 11/07/2019   Influenza, High Dose Seasonal PF 11/14/2017, 10/30/2018   Influenza-Unspecified 11/14/2017, 10/30/2018, 11/10/2020   Moderna Sars-Covid-2 Vaccination 03/28/2019, 04/25/2019, 02/03/2020, 07/04/2020, 11/10/2020   Pneumococcal Conjugate-13 04/12/2018   Pneumococcal Polysaccharide-23 12/30/2020   Pneumococcal-Unspecified 08/06/2013   Zoster Recombinat (Shingrix) 11/14/2018, 07/29/2019    TDAP status: Up to date  Flu Vaccine status: Up to date  Pneumococcal vaccine status: Up to date  Covid-19 vaccine status: Completed vaccines  Qualifies for Shingles  Vaccine?  Yes   Zostavax completed No   Shingrix Completed?: Yes  Screening Tests Health Maintenance  Topic Date Due   COVID-19 Vaccine (6 - Booster for Moderna series) 01/05/2021   HEMOGLOBIN A1C  06/29/2021   FOOT EXAM  07/06/2021   OPHTHALMOLOGY EXAM  01/11/2022   TETANUS/TDAP  02/27/2022   MAMMOGRAM  09/18/2022   COLONOSCOPY (Pts 45-27yrs Insurance coverage will need to be confirmed)  12/24/2029   Pneumonia Vaccine 61+ Years old  Completed   INFLUENZA VACCINE  Completed   DEXA SCAN  Completed   Hepatitis C Screening  Completed   Zoster Vaccines- Shingrix  Completed   HPV VACCINES  Aged Out    Health Maintenance  Health Maintenance Due  Topic Date Due   COVID-19 Vaccine (6 - Booster for Moderna series) 01/05/2021    Colorectal cancer screening: Type of screening: Colonoscopy. Completed 12/25/2019. Repeat every 5 years  Mammogram status: Completed 09/17/2020. Repeat every year  Bone Density status: Completed 08/01/2018.  Lung Cancer Screening: (Low Dose CT Chest recommended if Age 39-80 years, 30 pack-year currently smoking OR have quit w/in 15years.) does not qualify.   Lung Cancer Screening Referral: no  Additional Screening:  Hepatitis C Screening: does qualify; Completed 02/28/2012  Vision Screening: Recommended annual ophthalmology exams for early detection of glaucoma and other disorders of the eye. Is the patient up to date with their annual eye exam?  Yes  Who is the provider or what is the name of the office in which the patient attends annual eye exams? [facility] If pt is not established with a provider, would they like to be referred to a provider to establish care? No .   Dental Screening: Recommended annual dental exams for proper oral hygiene  Community Resource Referral / Chronic Care Management: CRR required this visit?  No   CCM required this visit?  No      Plan:     I have personally reviewed and noted the following in the  patients chart:   Medical and social history Use of alcohol, tobacco or illicit drugs  Current medications and supplements including opioid prescriptions.  Functional ability and status Nutritional status Physical activity Advanced directives List of other physicians Hospitalizations, surgeries, and ER visits in previous 12 months Vitals Screenings to include cognitive, depression, and falls Referrals and appointments  In addition, I have reviewed and discussed with patient certain preventive protocols, quality metrics, and best practice recommendations. A written personalized care plan for preventive services as well as general preventive health recommendations were provided to patient.     Kellie Simmering, LPN   [id number]   Nurse Notes: none

## 2021-04-01 ENCOUNTER — Telehealth: Payer: Self-pay

## 2021-04-01 LAB — CMP14+EGFR
ALT: 33 IU/L — ABNORMAL HIGH (ref 0–32)
AST: 34 IU/L (ref 0–40)
Albumin/Globulin Ratio: 1.7 (ref 1.2–2.2)
Albumin: 4.8 g/dL (ref 3.8–4.8)
Alkaline Phosphatase: 84 IU/L (ref 44–121)
BUN/Creatinine Ratio: 23 (ref 12–28)
BUN: 17 mg/dL (ref 8–27)
Bilirubin Total: 0.3 mg/dL (ref 0.0–1.2)
CO2: 23 mmol/L (ref 20–29)
Calcium: 9.9 mg/dL (ref 8.7–10.3)
Chloride: 100 mmol/L (ref 96–106)
Creatinine, Ser: 0.74 mg/dL (ref 0.57–1.00)
Globulin, Total: 2.8 g/dL (ref 1.5–4.5)
Glucose: 131 mg/dL — ABNORMAL HIGH (ref 70–99)
Potassium: 4.2 mmol/L (ref 3.5–5.2)
Sodium: 136 mmol/L (ref 134–144)
Total Protein: 7.6 g/dL (ref 6.0–8.5)
eGFR: 88 mL/min/1.73

## 2021-04-01 LAB — CK: Total CK: 674 U/L (ref 32–182)

## 2021-04-01 LAB — CBC
Hematocrit: 33.6 % — ABNORMAL LOW (ref 34.0–46.6)
Hemoglobin: 11.2 g/dL (ref 11.1–15.9)
MCH: 27.5 pg (ref 26.6–33.0)
MCHC: 33.3 g/dL (ref 31.5–35.7)
MCV: 82 fL (ref 79–97)
Platelets: 283 10*3/uL (ref 150–450)
RBC: 4.08 x10E6/uL (ref 3.77–5.28)
RDW: 14.5 % (ref 11.7–15.4)
WBC: 7.2 10*3/uL (ref 3.4–10.8)

## 2021-04-01 LAB — HEMOGLOBIN A1C
Est. average glucose Bld gHb Est-mCnc: 163 mg/dL
Hgb A1c MFr Bld: 7.3 % — ABNORMAL HIGH (ref 4.8–5.6)

## 2021-04-01 LAB — LIPID PANEL
Chol/HDL Ratio: 2.5 ratio (ref 0.0–4.4)
Cholesterol, Total: 159 mg/dL (ref 100–199)
HDL: 63 mg/dL
LDL Chol Calc (NIH): 78 mg/dL (ref 0–99)
Triglycerides: 96 mg/dL (ref 0–149)
VLDL Cholesterol Cal: 18 mg/dL (ref 5–40)

## 2021-04-01 NOTE — Telephone Encounter (Signed)
pls call [facility] and advise her cpk levels are quite elevated, should stop pravastatin over the weekend and restart mwf next week. If sob worsens over the weekend, she needs to go to ER   I called and left pt vm YL,RMA

## 2021-04-04 ENCOUNTER — Telehealth: Payer: Medicare Other

## 2021-04-04 ENCOUNTER — Ambulatory Visit (INDEPENDENT_AMBULATORY_CARE_PROVIDER_SITE_OTHER): Payer: Medicare Other

## 2021-04-04 DIAGNOSIS — I10 Essential (primary) hypertension: Secondary | ICD-10-CM

## 2021-04-04 DIAGNOSIS — E1165 Type 2 diabetes mellitus with hyperglycemia: Secondary | ICD-10-CM

## 2021-04-04 DIAGNOSIS — E782 Mixed hyperlipidemia: Secondary | ICD-10-CM

## 2021-04-04 NOTE — Chronic Care Management (AMB) (Signed)
Chronic Care Management   CCM RN Visit Note  04/04/2021 Name: Michelle Hicks MRN: [id number] DOB: 1950/12/01  Subjective: Michelle Hicks is a 71 y.o. year old female who is a primary care patient of Glendale Chard, MD. The care management team was consulted for assistance with disease management and care coordination needs.    Engaged with patient by telephone for follow up visit in response to provider referral for case management and/or care coordination services.   Consent to Services:  The patient was given information about Chronic Care Management services, agreed to services, and gave verbal consent prior to initiation of services.  Please see initial visit note for detailed documentation.   Patient agreed to services and verbal consent obtained.   Assessment: Review of patient past medical history, allergies, medications, health status, including review of consultants reports, laboratory and other test data, was performed as part of comprehensive evaluation and provision of chronic care management services.   SDOH (Social Determinants of Health) assessments and interventions performed:  Yes, no acute challenges   CCM Care Plan  Allergies  Allergen Reactions   Sulfa Antibiotics    Sulfamethoxazole-Trimethoprim     Other reaction(s): Unknown   Other Rash    Outpatient Encounter Medications as of 04/04/2021  Medication Sig   aspirin EC 81 MG tablet Take 81 mg by mouth daily. Some times   Cholecalciferol (VITAMIN D-3 PO) Take 2,000 Units by mouth daily.    doxycycline (VIBRA-TABS) 100 MG tablet Take 1 tablet (100 mg total) by mouth 2 (two) times daily.   hydrochlorothiazide (HYDRODIURIL) 25 MG tablet Take 1 tablet (25 mg total) by mouth daily.   Lancets Misc. (ONE TOUCH SURESOFT) MISC Use as directed to check blood sugars 2 times per day dx e11.65   levothyroxine (SYNTHROID) 50 MCG tablet Take 50 mcg by mouth daily.    LINZESS 290 MCG CAPS capsule Take 290 mcg by mouth  every morning.   meloxicam (MOBIC) 15 MG tablet TAKE 1 TABLET BY MOUTH  DAILY AS NEEDED   Multiple Vitamin (MULTIVITAMIN) tablet Take 1 tablet by mouth daily.   omeprazole (PRILOSEC) 40 MG capsule TAKE 1 CAPSULE BY MOUTH  DAILY BEFORE A MEAL   ONETOUCH ULTRA test strip USE AS DIRECTED TO CHECK  BLOOD SUGAR TWICE DAILY   pravastatin (PRAVACHOL) 40 MG tablet TAKE 1 TABLET BY MOUTH  DAILY   Semaglutide, 1 MG/DOSE, (OZEMPIC, 1 MG/DOSE,) 2 MG/1.5ML SOPN Inject 1 mg into the skin once a week.   telmisartan (MICARDIS) 80 MG tablet TAKE 1 TABLET BY MOUTH  DAILY   traZODone (DESYREL) 50 MG tablet Take 1 tablet (50 mg total) by mouth at bedtime as needed for sleep.   No facility-administered encounter medications on file as of 04/04/2021.    Patient Active Problem List   Diagnosis Date Noted   Attention deficit hyperactivity disorder, predominantly inattentive type 12/06/2020   History of anemia 12/06/2020   Hyperglycemia due to type 2 diabetes mellitus ([place]) 12/06/2020   Hypothyroidism 12/06/2020   Menopausal symptom 12/06/2020   Non-toxic goiter 12/06/2020   Pure hypercholesterolemia 12/06/2020   Positive ANA (antinuclear antibody) 12/06/2020   Pain in left knee 12/06/2020   Chronic insomnia 04/26/2020   Snoring 04/26/2020   Class 3 severe obesity due to excess calories with serious comorbidity and body mass index (BMI) of 40.0 to 44.9 in adult [facility]) 03/18/2019   Essential hypertension, benign 11/13/2017   Gastroesophageal reflux disease without esophagitis 11/13/2017   Hypertension 09/04/2017  Type II diabetes mellitus, uncontrolled 09/04/2017   S/P abdominal hysterectomy and left salpingo-oophorectomy 08/18/1994    Conditions to be addressed/monitored: HTN, DM II, HLD  Care Plan : RN Care Manager Plan of Care  Updates made by Lynne Logan, RN since 04/04/2021 12:00 AM     Problem: No plan of care established for management of chronic disease states (HTN, DM II, HLD)   Priority:  High     Long-Range Goal: Development of plan of care for chronic disease states (HTN, DM II, HLD)   Start Date: 01/25/2021  Expected End Date: 01/25/2022  Recent Progress: On track  Priority: High  Note:   Current Barriers:  Knowledge Deficits related to plan of care for management of HTN, DM II, HLD  Chronic Disease Management support and education needs related to HTN, DM II, HLD   RNCM Clinical Goal(s):  Patient will verbalize basic understanding of  HTN, DM II, HLD disease process and self health management plan as evidenced by patient will report having no disease exacerbations related to her chronic disease states listed above take all medications exactly as prescribed and will call provider for medication related questions as evidenced by patient will report having no misses doses of her prescribed medications demonstrate Ongoing health management independence as evidenced by patient will report 100% adherence to her prescribed treatment plan  continue to work with RN Care Manager to address care management and care coordination needs related to  HTN, DM II, HLD as evidenced by adherence to CM Team Scheduled appointments demonstrate ongoing self health care management ability   as evidenced by    through collaboration with RN Care manager, provider, and care team.   Interventions: 1:1 collaboration with primary care provider regarding development and update of comprehensive plan of care as evidenced by provider attestation and co-signature Inter-disciplinary care team collaboration (see longitudinal plan of care) Evaluation of current treatment plan related to  self management and patient's adherence to plan as established by provider  Diabetes Interventions:  (Status:  Goal on track:  Yes.) Long Term Goal Assessed patient's understanding of A1c goal: <6.5% Provided education to patient about basic DM disease process including complications and risks involved including heart  disease and delayed wound healing  Reviewed medications with patient and discussed importance of medication adherence Review of patient status, including review of consultants reports, relevant laboratory and other test results, and medications completed Educated patient on dietary and exercise recommendations Mailed printed educational materials related to Diabetes management  Mailed printed educational materials related to How to Perform Chair Exercises  Discussed plans with patient for ongoing care management follow up and provided patient with direct contact information for care management team Lab Results  Component Value Date   HGBA1C 7.3 (H) 03/31/2021    Hypertension Interventions:  (Status:  Condition stable.  Not addressed this visit.) Long Term Goal Last practice recorded BP readings:  BP Readings from Last 3 Encounters:  12/30/20 122/80  12/06/20 (!) 148/72  10/12/20 (!) 158/82  Most recent eGFR/CrCl:  Lab Results  Component Value Date   EGFR 77 12/30/2020    No components found for: CRCL Evaluation of current treatment plan related to hypertension self management and patient's adherence to plan as established by provider Reviewed medications with patient and discussed importance of compliance Counseled on the importance of exercise goals with target of 150 minutes per week Advised patient, providing education and rationale, to monitor blood pressure daily and record, calling PCP for findings  outside established parameters Provided education on prescribed diet low Sodium Discussed complications of poorly controlled blood pressure such as heart disease, stroke, circulatory complications, vision complications, kidney impairment, sexual dysfunction Discussed plans with patient for ongoing care management follow up and provided patient with direct contact information for care management team  Elevated CK enzyme:  (Status:  New goal.)  Short Term Goal Evaluation of current  treatment plan related to  Elevated CK enzyme , self-management and patient's adherence to plan as established by provider Review of patient status, including review of consultant's reports, relevant laboratory and other test results, and medications completed Educated patient on the basic disease process related to CK enzyme (Creatinine Kinase) Reviewed PCP recommendations for Cardiology referral, educated patient on referral process  Reviewed medications with patient and discussed importance of medication adherence including recommendations by PCP to stop pravastatin over the weekend (this past) and restart mwf next week Determined patient continues to experience shortness of breath with activity, encouraged patient to learn deep breathing exercises as needed to help slow breathing; Mailed printed educational materials related to pursed lip breathing  Discussed plans with patient for ongoing care management follow up and provided patient with direct contact information for care management team   Right Axilla abscess:  (Status:  New goal.)  Short Term Goal Evaluation of current treatment plan related to , self-management and patient's adherence to plan as established by provider Determined patient continues to have an early axilla abscess recently evaluated by PCP Reviewed and discussed PCP recommendations to start Doxycycline 100 mg bid x 10 days, instructed patient to complete full course of antibiotics, educated on sensitivity to sunlight, to use SPF sunscreen if outdoors while taking this medication Advised patient against using dry heat, instructed on how to apply moist heat Educated on signs/symptoms suggestive of worsening condition, educated on risk for delayed healing due to having diabetes, instructed patient to contact PCP if this abscess begins to drain and or if other symptoms discussed occur indicating the abscess is worsening  Discussed plans with patient for ongoing care management  follow up and provided patient with direct contact information for care management team     Patient Goals/Self-Care Activities: Take all medications as prescribed Attend all scheduled provider appointments Call pharmacy for medication refills 3-7 days in advance of running out of medications Perform all self care activities independently  Perform IADL's (shopping, preparing meals, housekeeping, managing finances) independently Call provider office for new concerns or questions  drink 6 to 8 glasses of water each day eat fish at least once per week fill half of plate with vegetables manage portion size call doctor for signs and symptoms of high blood pressure keep all doctor appointments take medications for blood pressure exactly as prescribed report new symptoms to your doctor Complete full course of Doxycycline Follow up with Cardiology as directed for evaluation of shortness of breath and elevated CK enzyme   Follow Up Plan:  Telephone follow up appointment with care management team member scheduled for:  04/18/21     Barb Merino, RN, BSN, CCM Care Management Coordinator [facility]  Direct Phone: [phone]

## 2021-04-04 NOTE — Patient Instructions (Signed)
Visit Information  Thank you for taking time to visit with me today. Please don't hesitate to contact me if I can be of assistance to you before our next scheduled telephone appointment.  Following are the goals we discussed today:  (Copy and paste patient goals from clinical care plan here)  Our next appointment is by telephone on 04/18/21 at 9:30 AM   Please call the care guide team at [phone] if you need to cancel or reschedule your appointment.   If you are experiencing a Mental Health or [place] or need someone to talk to, please call [phone]-TALK (toll free, 24 hour hotline)   Patient verbalizes understanding of instructions and care plan provided today and agrees to view in [place]. Active MyChart status confirmed with patient.    Barb Merino, RN, BSN, CCM Care Management Coordinator [place] [facility]  Direct Phone: [phone]

## 2021-04-08 ENCOUNTER — Other Ambulatory Visit: Payer: Self-pay

## 2021-04-08 DIAGNOSIS — M791 Myalgia, unspecified site: Secondary | ICD-10-CM

## 2021-04-12 ENCOUNTER — Telehealth (INDEPENDENT_AMBULATORY_CARE_PROVIDER_SITE_OTHER): Payer: Medicare Other | Admitting: Adult Health

## 2021-04-12 ENCOUNTER — Other Ambulatory Visit: Payer: Medicare Other

## 2021-04-12 ENCOUNTER — Other Ambulatory Visit: Payer: Self-pay

## 2021-04-12 DIAGNOSIS — G4733 Obstructive sleep apnea (adult) (pediatric): Secondary | ICD-10-CM | POA: Diagnosis not present

## 2021-04-12 DIAGNOSIS — I10 Essential (primary) hypertension: Secondary | ICD-10-CM

## 2021-04-12 DIAGNOSIS — E782 Mixed hyperlipidemia: Secondary | ICD-10-CM

## 2021-04-12 DIAGNOSIS — Z9989 Dependence on other enabling machines and devices: Secondary | ICD-10-CM

## 2021-04-12 DIAGNOSIS — M791 Myalgia, unspecified site: Secondary | ICD-10-CM | POA: Diagnosis not present

## 2021-04-12 DIAGNOSIS — E1165 Type 2 diabetes mellitus with hyperglycemia: Secondary | ICD-10-CM | POA: Diagnosis not present

## 2021-04-12 NOTE — Progress Notes (Signed)
PATIENT: Michelle Hicks DOB: 01-18-1951  REASON FOR VISIT: follow up HISTORY FROM: patient PRIMARY NEUROLOGIST:   Virtual Visit via Video Note  I connected with Michelle Hicks on 04/12/21 at  2:45 PM EST by a video enabled telemedicine application located remotely at [facility] and verified that I am speaking with the correct person using two identifiers who was located at their own home.   I discussed the limitations of evaluation and management by telemedicine and the availability of in person appointments. The patient expressed understanding and agreed to proceed.   PATIENT: Michelle Hicks DOB: 07-Jun-1950  REASON FOR VISIT: follow up HISTORY FROM: patient  HISTORY OF PRESENT ILLNESS: Today 04/12/21:  Michelle Hicks is a 71 year old female with a history of obstructive sleep apnea on CPAP.  She returns today for virtual visit.  Her download is below.  However the patient states that she has used it more than what the download is reflecting.  She states that she looks at her app daily.    REVIEW OF SYSTEMS: Out of a complete 14 system review of symptoms, the patient complains only of the following symptoms, and all other reviewed systems are negative.  ALLERGIES: Allergies  Allergen Reactions   Sulfa Antibiotics    Sulfamethoxazole-Trimethoprim     Other reaction(s): Unknown   Other Rash    HOME MEDICATIONS: Outpatient Medications Prior to Visit  Medication Sig Dispense Refill   aspirin EC 81 MG tablet Take 81 mg by mouth daily. Some times     Cholecalciferol (VITAMIN D-3 PO) Take 2,000 Units by mouth daily.      doxycycline (VIBRA-TABS) 100 MG tablet Take 1 tablet (100 mg total) by mouth 2 (two) times daily. 20 tablet 0   hydrochlorothiazide (HYDRODIURIL) 25 MG tablet Take 1 tablet (25 mg total) by mouth daily. 90 tablet 3   Lancets Misc. (ONE TOUCH SURESOFT) MISC Use as directed to check blood sugars 2 times per day dx e11.65 300 each 2    levothyroxine (SYNTHROID) 50 MCG tablet Take 50 mcg by mouth daily.      LINZESS 290 MCG CAPS capsule Take 290 mcg by mouth every morning.     meloxicam (MOBIC) 15 MG tablet TAKE 1 TABLET BY MOUTH  DAILY AS NEEDED 90 tablet 1   Multiple Vitamin (MULTIVITAMIN) tablet Take 1 tablet by mouth daily.     omeprazole (PRILOSEC) 40 MG capsule TAKE 1 CAPSULE BY MOUTH  DAILY BEFORE A MEAL 90 capsule 2   ONETOUCH ULTRA test strip USE AS DIRECTED TO CHECK  BLOOD SUGAR TWICE DAILY 200 strip 3   pravastatin (PRAVACHOL) 40 MG tablet TAKE 1 TABLET BY MOUTH  DAILY 90 tablet 3   Semaglutide, 1 MG/DOSE, (OZEMPIC, 1 MG/DOSE,) 2 MG/1.5ML SOPN Inject 1 mg into the skin once a week. 4.5 mL 3   telmisartan (MICARDIS) 80 MG tablet TAKE 1 TABLET BY MOUTH  DAILY 90 tablet 3   traZODone (DESYREL) 50 MG tablet Take 1 tablet (50 mg total) by mouth at bedtime as needed for sleep. 90 tablet 1   No facility-administered medications prior to visit.    PAST MEDICAL HISTORY: Past Medical History:  Diagnosis Date   Depression    DM (diabetes mellitus) ([place])    HTN (hypertension)     PAST SURGICAL HISTORY: Past Surgical History:  Procedure Laterality Date   ABDOMINAL HYSTERECTOMY     TONSILLECTOMY Bilateral 1967    FAMILY HISTORY: Family History  Problem Relation Age of Onset   Diabetes Mother    Pancreatic cancer Mother    Hypertension Father    Heart attack Father     SOCIAL HISTORY: Social History   Socioeconomic History   Marital status: Married    Spouse name: Not on file   Number of children: Not on file   Years of education: Not on file   Highest education level: Not on file  Occupational History   Occupation: retired  Tobacco Use   Smoking status: Former    Packs/day: 0.50    Years: 15.00    Pack years: 7.50    Types: Cigarettes    Quit date: 2002    Years since quitting: 21.1    Passive exposure: Past   Smokeless tobacco: Never  Vaping Use   Vaping Use: Never used  Substance and  Sexual Activity   Alcohol use: Yes    Comment: socially    Drug use: No   Sexual activity: Not Currently    Partners: Male    Birth control/protection: Surgical  Other Topics Concern   Not on file  Social History Narrative   Not on file   Social Determinants of Health   Financial Resource Strain: Low Risk    Difficulty of Paying Living Expenses: Not hard at all  Food Insecurity: No Food Insecurity   Worried About Charity fundraiser in the Last Year: Never true   [place] in the Last Year: Never true  Transportation Needs: No Transportation Needs   Lack of Transportation (Medical): No   Lack of Transportation (Non-Medical): No  Physical Activity: Inactive   Days of Exercise per Week: 0 days   Minutes of Exercise per Session: 0 min  Stress: No Stress Concern Present   Feeling of Stress : Only a little  Social Connections: Not on file  Intimate Partner Violence: Not on file      PHYSICAL EXAM Generalized: Well developed, in no acute distress   Neurological examination  Mentation: Alert oriented to time, place, history taking. Follows all commands speech and language fluent Cranial nerve II-XII:Extraocular movements were full. Facial symmetry noted. uvula  Head turning and shoulder shrug  were normal and symmetric.  DIAGNOSTIC DATA (LABS, IMAGING, TESTING) - I reviewed patient records, labs, notes, testing and imaging myself where available.  Lab Results  Component Value Date   WBC 7.2 03/31/2021   HGB 11.2 03/31/2021   HCT 33.6 (L) 03/31/2021   MCV 82 03/31/2021   PLT 283 03/31/2021      Component Value Date/Time   NA 136 03/31/2021 1055   K 4.2 03/31/2021 1055   CL 100 03/31/2021 1055   CO2 23 03/31/2021 1055   GLUCOSE 131 (H) 03/31/2021 1055   GLUCOSE 98 10/13/2009 1956   BUN 17 03/31/2021 1055   CREATININE 0.74 03/31/2021 1055   CALCIUM 9.9 03/31/2021 1055   PROT 7.6 03/31/2021 1055   ALBUMIN 4.8 03/31/2021 1055   AST 34 03/31/2021 1055   ALT  33 (H) 03/31/2021 1055   ALKPHOS 84 03/31/2021 1055   BILITOT 0.3 03/31/2021 1055   GFRNONAA 73 03/24/2020 1034   GFRAA 84 03/24/2020 1034   Lab Results  Component Value Date   CHOL 159 03/31/2021   HDL 63 03/31/2021   LDLCALC 78 03/31/2021   TRIG 96 03/31/2021   CHOLHDL 2.5 03/31/2021   Lab Results  Component Value Date   HGBA1C 7.3 (H) 03/31/2021   Lab Results  Component Value Date   VITAMINB12 1,502 (H) 12/30/2020   Lab Results  Component Value Date   TSH 1.020 10/06/2020      ASSESSMENT AND PLAN 71 y.o. year old female  has a past medical history of Depression, DM (diabetes mellitus) ([place]), and HTN (hypertension). here with:  OSA on CPAP  CPAP compliance suboptimal, patient states that she is using it more than the download is reflecting.  Advised that she should contact her DME company to make sure information is being recorded correctly Residual AHI is good Encouraged patient to continue using CPAP nightly and > 4 hours each night F/U in 1 year or sooner if needed   Ward Givens, MSN, NP-C 04/12/2021, 2:20 PM [facility] [address], [place], [place] [address] [phone]

## 2021-04-13 LAB — CK: Total CK: 795 U/L (ref 32–182)

## 2021-04-14 ENCOUNTER — Encounter: Payer: Self-pay | Admitting: Internal Medicine

## 2021-04-14 ENCOUNTER — Other Ambulatory Visit (INDEPENDENT_AMBULATORY_CARE_PROVIDER_SITE_OTHER): Payer: Medicare Other

## 2021-04-14 ENCOUNTER — Telehealth: Payer: Self-pay

## 2021-04-14 DIAGNOSIS — M791 Myalgia, unspecified site: Secondary | ICD-10-CM | POA: Diagnosis not present

## 2021-04-14 LAB — POCT URINALYSIS DIPSTICK
Bilirubin, UA: NEGATIVE
Blood, UA: NEGATIVE
Glucose, UA: NEGATIVE
Ketones, UA: NEGATIVE
Leukocytes, UA: NEGATIVE
Nitrite, UA: NEGATIVE
Protein, UA: NEGATIVE
Spec Grav, UA: <=1.005 — AB (ref 1.010–1.025)
Urobilinogen, UA: 0.2 E.U./dL
pH, UA: 5.5 (ref 5.0–8.0)

## 2021-04-14 NOTE — Telephone Encounter (Signed)
I called the pt to let her know that Dr. Baird Cancer wants to make sure that the pt stopped her cholesterol medication for now and is staying hydrated.  Dr Baird Cancer also wanted to know how the pt is doing? ?

## 2021-04-18 ENCOUNTER — Encounter: Payer: Self-pay | Admitting: Adult Health

## 2021-04-18 ENCOUNTER — Ambulatory Visit (INDEPENDENT_AMBULATORY_CARE_PROVIDER_SITE_OTHER): Payer: Medicare Other

## 2021-04-18 ENCOUNTER — Telehealth: Payer: Medicare Other

## 2021-04-18 DIAGNOSIS — E1165 Type 2 diabetes mellitus with hyperglycemia: Secondary | ICD-10-CM

## 2021-04-18 DIAGNOSIS — E782 Mixed hyperlipidemia: Secondary | ICD-10-CM

## 2021-04-18 DIAGNOSIS — I10 Essential (primary) hypertension: Secondary | ICD-10-CM

## 2021-04-18 NOTE — Telephone Encounter (Signed)
Pt stopped by office to provide SD card. SD card had another pt name. However, pt confirmed she put in machine today. Confirmed data from DL matched her data on her cell phone. She will contact DME to correct info/get new SD card w/ her info. Made pt aware I spoke w/ MM,NP. Data looked good.  ? ? ? ? ? ? ?

## 2021-04-19 NOTE — Patient Instructions (Signed)
Visit Information ? ?Thank you for taking time to visit with me today. Please don't hesitate to contact me if I can be of assistance to you before our next scheduled telephone appointment. ? ?Following are the goals we discussed today:  ?(Copy and paste patient goals from clinical care plan here) ? ?Our next appointment is by telephone on 05/31/21 at 10:30 AM  ? ?Please call the care guide team at [phone] if you need to cancel or reschedule your appointment.  ? ?If you are experiencing a Mental Health or [place] or need someone to talk to, please call [phone]-TALK (toll free, 24 hour hotline)  ? ?Patient verbalizes understanding of instructions and care plan provided today and agrees to view in [place]. Active MyChart status confirmed with patient.   ? ?Barb Merino, RN, BSN, CCM ?Care Management Coordinator ?[place] [facility]  ?Direct Phone: [phone] ? ? ?

## 2021-04-19 NOTE — Chronic Care Management (AMB) (Signed)
Chronic Care Management   CCM RN Visit Note  04/18/2021 Name: Michelle Hicks MRN: [id number] DOB: 10-14-1950  Subjective: Michelle Hicks is a 71 y.o. year old female who is a primary care patient of Glendale Chard, MD. The care management team was consulted for assistance with disease management and care coordination needs.    Engaged with patient by telephone for follow up visit in response to provider referral for case management and/or care coordination services.   Consent to Services:  The patient was given information about Chronic Care Management services, agreed to services, and gave verbal consent prior to initiation of services.  Please see initial visit note for detailed documentation.   Patient agreed to services and verbal consent obtained.   Assessment: Review of patient past medical history, allergies, medications, health status, including review of consultants reports, laboratory and other test data, was performed as part of comprehensive evaluation and provision of chronic care management services.   SDOH (Social Determinants of Health) assessments and interventions performed:  yes, no acute needs  CCM Care Plan  Allergies  Allergen Reactions   Sulfa Antibiotics    Sulfamethoxazole-Trimethoprim     Other reaction(s): Unknown   Other Rash    Outpatient Encounter Medications as of 04/18/2021  Medication Sig   aspirin EC 81 MG tablet Take 81 mg by mouth daily. Some times   Cholecalciferol (VITAMIN D-3 PO) Take 2,000 Units by mouth daily.    doxycycline (VIBRA-TABS) 100 MG tablet Take 1 tablet (100 mg total) by mouth 2 (two) times daily.   hydrochlorothiazide (HYDRODIURIL) 25 MG tablet Take 1 tablet (25 mg total) by mouth daily.   Lancets Misc. (ONE TOUCH SURESOFT) MISC Use as directed to check blood sugars 2 times per day dx e11.65   levothyroxine (SYNTHROID) 50 MCG tablet Take 50 mcg by mouth daily.    LINZESS 290 MCG CAPS capsule Take 290 mcg by mouth every  morning.   meloxicam (MOBIC) 15 MG tablet TAKE 1 TABLET BY MOUTH  DAILY AS NEEDED   Multiple Vitamin (MULTIVITAMIN) tablet Take 1 tablet by mouth daily.   omeprazole (PRILOSEC) 40 MG capsule TAKE 1 CAPSULE BY MOUTH  DAILY BEFORE A MEAL   ONETOUCH ULTRA test strip USE AS DIRECTED TO CHECK  BLOOD SUGAR TWICE DAILY   pravastatin (PRAVACHOL) 40 MG tablet TAKE 1 TABLET BY MOUTH  DAILY (Patient not taking: Reported on 04/19/2021)   Semaglutide, 1 MG/DOSE, (OZEMPIC, 1 MG/DOSE,) 2 MG/1.5ML SOPN Inject 1 mg into the skin once a week.   telmisartan (MICARDIS) 80 MG tablet TAKE 1 TABLET BY MOUTH  DAILY   traZODone (DESYREL) 50 MG tablet Take 1 tablet (50 mg total) by mouth at bedtime as needed for sleep.   No facility-administered encounter medications on file as of 04/18/2021.    Patient Active Problem List   Diagnosis Date Noted   Attention deficit hyperactivity disorder, predominantly inattentive type 12/06/2020   History of anemia 12/06/2020   Hyperglycemia due to type 2 diabetes mellitus ([place]) 12/06/2020   Hypothyroidism 12/06/2020   Menopausal symptom 12/06/2020   Non-toxic goiter 12/06/2020   Pure hypercholesterolemia 12/06/2020   Positive ANA (antinuclear antibody) 12/06/2020   Pain in left knee 12/06/2020   Chronic insomnia 04/26/2020   Snoring 04/26/2020   Class 3 severe obesity due to excess calories with serious comorbidity and body mass index (BMI) of 40.0 to 44.9 in adult [facility]) 03/18/2019   Essential hypertension, benign 11/13/2017   Gastroesophageal reflux disease without esophagitis  11/13/2017   Hypertension 09/04/2017   Type II diabetes mellitus, uncontrolled 09/04/2017   S/P abdominal hysterectomy and left salpingo-oophorectomy 08/18/1994    Conditions to be addressed/monitored: HTN, DM II, HLD  Care Plan : RN Care Manager Plan of Care  Updates made by Lynne Logan, RN since 04/18/2021 12:00 AM     Problem: No plan of care established for management of chronic disease  states (HTN, DM II, HLD)   Priority: High     Long-Range Goal: Development of plan of care for chronic disease states (HTN, DM II, HLD)   Start Date: 01/25/2021  Expected End Date: 01/25/2022  Recent Progress: On track  Priority: High  Note:   Current Barriers:  Knowledge Deficits related to plan of care for management of HTN, DM II, HLD  Chronic Disease Management support and education needs related to HTN, DM II, HLD   RNCM Clinical Goal(s):  Patient will verbalize basic understanding of  HTN, DM II, HLD disease process and self health management plan as evidenced by patient will report having no disease exacerbations related to her chronic disease states listed above take all medications exactly as prescribed and will call provider for medication related questions as evidenced by patient will report having no misses doses of her prescribed medications demonstrate Ongoing health management independence as evidenced by patient will report 100% adherence to her prescribed treatment plan  continue to work with RN Care Manager to address care management and care coordination needs related to  HTN, DM II, HLD as evidenced by adherence to CM Team Scheduled appointments demonstrate ongoing self health care management ability   as evidenced by    through collaboration with RN Care manager, provider, and care team.   Interventions: 1:1 collaboration with primary care provider regarding development and update of comprehensive plan of care as evidenced by provider attestation and co-signature Inter-disciplinary care team collaboration (see longitudinal plan of care) Evaluation of current treatment plan related to  self management and patient's adherence to plan as established by provider   Diabetes Interventions:  (Status:  Goal on track:  Yes.) Long Term Goal Assessed patient's understanding of A1c goal: <7% Reviewed medications with patient and discussed importance of medication  adherence Counseled on importance of regular laboratory monitoring as prescribed Advised patient, providing education and rationale, to check cbg daily before breakfast and at bedtime and record, calling PCP for findings outside established parameters Review of patient status, including review of consultants reports, relevant laboratory and other test results, and medications completed Educated patient on dietary and exercise recommendations  Mailed printed educational materials related Carb Choices, Meal Planning and Chair Exercises  Lab Results  Component Value Date   HGBA1C 7.3 (H) 03/31/2021   Hypertension Interventions:  (Status:  Goal on track:  Yes.) Long Term Goal Last practice recorded BP readings:  BP Readings from Last 3 Encounters:  03/31/21 120/82  03/31/21 130/60  12/30/20 122/80  Most recent eGFR/CrCl:  Lab Results  Component Value Date   EGFR 88 03/31/2021    No components found for: CRCL Evaluation of current treatment plan related to hypertension self management and patient's adherence to plan as established by provider Provided education to patient re: stroke prevention, s/s of heart attack and stroke Reviewed medications with patient and discussed importance of compliance Counseled on the importance of exercise goals with target of 150 minutes per week Advised patient, providing education and rationale, to monitor blood pressure daily and record, calling PCP for findings outside  established parameters Provided education on prescribed diet low Sodium  Elevated CK enzyme:  (Status:  Goal on track:  Yes.)  Short Term Goal Evaluation of current treatment plan related to  Elevated CK enzyme , self-management and patient's adherence to plan as established by provider Review of patient status, including review of consultant's reports, relevant laboratory and other test results, and medications completed Determined patient has not received a call from Cardiology for new  patient appointment Reviewed chart and noted referral was sent on 03/31/21 by PCP an authorized for Cardiology, sent to [facility] Advised patient to contact this provider to schedule a new patient appointment, provided patient with the provider name/contact and location Discussed plans with patient for ongoing care management follow up and provided patient with direct contact information for care management team   Hyperlipidemia Interventions:  (Status:  New goal.) Long Term Goal Review of patient status, including review of consultant's reports, relevant laboratory and other test results, and medications completed Reviewed medications with patient and discussed importance of medication adherence, determined patient reported to PCP having myalgia secondary to taking her statin, reviewed PCP recommendations to discontinue Pravastatin Provider established cholesterol goals reviewed Counseled on importance of regular laboratory monitoring as prescribed Provided HLD educational materials Reviewed role and benefits of statin for ASCVD risk reduction Reviewed importance of limiting foods high in cholesterol Reviewed exercise goals and target of 150 minutes per week Educated on increased risk for heart disease due to having diabetes Encouraged patient to discuss alternative treatment options for HLD with Cardiology during initial visit to establish ongoing treatment management   Lipid Panel     Component Value Date/Time   CHOL 159 03/31/2021 1055   TRIG 96 03/31/2021 1055   HDL 63 03/31/2021 1055   CHOLHDL 2.5 03/31/2021 1055   [place] 78 03/31/2021 1055   LABVLDL 18 03/31/2021 1055     Right Axilla abscess:  (Status:  Goal Met.)  Short Term Goal Evaluation of current treatment plan related to , self-management and patient's adherence to plan as established by provider Determined patient feels her right axilla abscess has resolved due to having no further drainage or  tenderness, she completed full course of antibiotics Educated on signs/symptoms suggestive of reoccurring infection/abscess, educated on risk for delayed healing due to having diabetes, instructed patient to contact PCP if symptoms suggestive of reoccurring abscess occur  Discussed plans with patient for ongoing care management follow up and provided patient with direct contact information for care management team   Patient Goals/Self-Care Activities: Take all medications as prescribed Attend all scheduled provider appointments Call pharmacy for medication refills 3-7 days in advance of running out of medications Perform all self care activities independently  Perform IADL's (shopping, preparing meals, housekeeping, managing finances) independently Call provider office for new concerns or questions  drink 6 to 8 glasses of water each day eat fish at least once per week fill half of plate with vegetables manage portion size call doctor for signs and symptoms of high blood pressure keep all doctor appointments take medications for blood pressure exactly as prescribed report new symptoms to your doctor Complete full course of Doxycycline Follow up with Cardiology as directed for evaluation of shortness of breath and elevated CK enzyme   Follow Up Plan:  Telephone follow up appointment with care management team member scheduled for:  05/31/21      Barb Merino, RN, BSN, CCM Care Management Coordinator [place] [facility]  Direct Phone:  [phone] ° ° ° ° ° ° ° ° °

## 2021-04-25 DIAGNOSIS — G4733 Obstructive sleep apnea (adult) (pediatric): Secondary | ICD-10-CM | POA: Diagnosis not present

## 2021-04-26 ENCOUNTER — Telehealth: Payer: Self-pay

## 2021-04-26 NOTE — Chronic Care Management (AMB) (Signed)
? ? ?  Chronic Care Management ?Pharmacy Assistant  ? ?Name: ARDENIA STINER  MRN: [id number] DOB: 12-Mar-1950 ? ?Reason for Encounter: Patient Assistance ?  ? ?Medications: ?Outpatient Encounter Medications as of 04/26/2021  ?Medication Sig  ? aspirin EC 81 MG tablet Take 81 mg by mouth daily. Some times  ? Cholecalciferol (VITAMIN D-3 PO) Take 2,000 Units by mouth daily.   ? doxycycline (VIBRA-TABS) 100 MG tablet Take 1 tablet (100 mg total) by mouth 2 (two) times daily.  ? hydrochlorothiazide (HYDRODIURIL) 25 MG tablet Take 1 tablet (25 mg total) by mouth daily.  ? Lancets Misc. (ONE TOUCH SURESOFT) MISC Use as directed to check blood sugars 2 times per day dx e11.65  ? levothyroxine (SYNTHROID) 50 MCG tablet Take 50 mcg by mouth daily.   ? LINZESS 290 MCG CAPS capsule Take 290 mcg by mouth every morning.  ? meloxicam (MOBIC) 15 MG tablet TAKE 1 TABLET BY MOUTH  DAILY AS NEEDED  ? Multiple Vitamin (MULTIVITAMIN) tablet Take 1 tablet by mouth daily.  ? omeprazole (PRILOSEC) 40 MG capsule TAKE 1 CAPSULE BY MOUTH  DAILY BEFORE A MEAL  ? ONETOUCH ULTRA test strip USE AS DIRECTED TO CHECK  BLOOD SUGAR TWICE DAILY  ? pravastatin (PRAVACHOL) 40 MG tablet TAKE 1 TABLET BY MOUTH  DAILY (Patient not taking: Reported on 04/19/2021)  ? Semaglutide, 1 MG/DOSE, (OZEMPIC, 1 MG/DOSE,) 2 MG/1.5ML SOPN Inject 1 mg into the skin once a week.  ? telmisartan (MICARDIS) 80 MG tablet TAKE 1 TABLET BY MOUTH  DAILY  ? traZODone (DESYREL) 50 MG tablet Take 1 tablet (50 mg total) by mouth at bedtime as needed for sleep.  ? ?No facility-administered encounter medications on file as of 04/26/2021.  ? ?04-19-2021: contacted [facility] for Ozempic follow up. Was informed medication is in the process of filling. Was provided a free voucher for patient. ? ?BIN [id number] ?PCN [id number] ?GP [id number] ?ID [id number] ? ?04-26-2021: contacted [facility] to provide free voucher. Was on hold for 12 minutes.Was informed that medication was filled on 03-31-2021 with a 47.00  copay. Free voucher went through. Left patient a VM. ? ?Jeannette How CMA ?Clinical Pharmacist Assistant ?[phone] ? ?

## 2021-04-29 ENCOUNTER — Telehealth: Payer: Self-pay

## 2021-04-29 NOTE — Telephone Encounter (Signed)
?  Patient Assistance Chronic Care Management  ? ? ?04/29/2021 ?Name: Michelle Hicks MRN: [id number] DOB: 10/14/1950 ? ?Referred by: Glendale Chard, MD ?Reason for referral : Patient Assistance follow up  ? ? ?Michelle Hicks is a 71 y.o. year old female who is a primary care patient of Glendale Chard, MD. The CCM team was consulted for assistance with chronic disease management and care coordination needs.   ? ?Review of patient status, including review of consultants reports, relevant laboratory and other test results, and collaboration with appropriate care team members and the patient's provider was performed as part of comprehensive patient evaluation and provision of chronic care management services.   ? ? ?Outpatient Encounter Medications as of 04/29/2021  ?Medication Sig  ? aspirin EC 81 MG tablet Take 81 mg by mouth daily. Some times  ? Cholecalciferol (VITAMIN D-3 PO) Take 2,000 Units by mouth daily.   ? doxycycline (VIBRA-TABS) 100 MG tablet Take 1 tablet (100 mg total) by mouth 2 (two) times daily.  ? hydrochlorothiazide (HYDRODIURIL) 25 MG tablet Take 1 tablet (25 mg total) by mouth daily.  ? Lancets Misc. (ONE TOUCH SURESOFT) MISC Use as directed to check blood sugars 2 times per day dx e11.65  ? levothyroxine (SYNTHROID) 50 MCG tablet Take 50 mcg by mouth daily.   ? LINZESS 290 MCG CAPS capsule Take 290 mcg by mouth every morning.  ? meloxicam (MOBIC) 15 MG tablet TAKE 1 TABLET BY MOUTH  DAILY AS NEEDED  ? Multiple Vitamin (MULTIVITAMIN) tablet Take 1 tablet by mouth daily.  ? omeprazole (PRILOSEC) 40 MG capsule TAKE 1 CAPSULE BY MOUTH  DAILY BEFORE A MEAL  ? ONETOUCH ULTRA test strip USE AS DIRECTED TO CHECK  BLOOD SUGAR TWICE DAILY  ? pravastatin (PRAVACHOL) 40 MG tablet TAKE 1 TABLET BY MOUTH  DAILY (Patient not taking: Reported on 04/19/2021)  ? Semaglutide, 1 MG/DOSE, (OZEMPIC, 1 MG/DOSE,) 2 MG/1.5ML SOPN Inject 1 mg into the skin once a week.  ? telmisartan (MICARDIS) 80 MG tablet TAKE 1 TABLET BY  MOUTH  DAILY  ? traZODone (DESYREL) 50 MG tablet Take 1 tablet (50 mg total) by mouth at bedtime as needed for sleep.  ? ?No facility-administered encounter medications on file as of 04/29/2021.  ?  ? ? ?Plan:  ?CARE PLAN ENTRY ?(see longitudinal plan of care for additional care plan information) ? ?Current Barriers:  ?Financial Barriers: patient has [employer]	[place] and reports copay for Ozempic 1 mg  is cost prohibitive at this time ? ?Pharmacist Clinical Goal(s):  ?Over the next 30 days, patient will work with PharmD and providers to relieve medication access concerns ? ?Interventions: ?Comprehensive medication review completed; medication list updated in electronic medical record.  ?Ozempic : Patient meets income/out of pocket spend criteria for this medication's patient assistance program. Reviewed application process. Patient will provide proof of income, out of pocket spend report, and will sign application. Will collaborate with primary care provider Dr. Baird Cancer for their portion of application. Once completed, Novo nordisk patient assistance program. ?Ozempic 1 mg came into [facility], called patient and let them know that Ozempic has come   ? ?Patient Self Care Activities:  ?Patient to pick up medication from [facility].  ? ?Orlando Penner, CPP, PharmD ?Clinical Pharmacist Practitioner ?[facility] ?[phone] ? ? ? ? ?

## 2021-05-12 ENCOUNTER — Encounter: Payer: Self-pay | Admitting: Adult Health

## 2021-05-13 DIAGNOSIS — E1165 Type 2 diabetes mellitus with hyperglycemia: Secondary | ICD-10-CM

## 2021-05-13 DIAGNOSIS — I10 Essential (primary) hypertension: Secondary | ICD-10-CM | POA: Diagnosis not present

## 2021-05-13 DIAGNOSIS — E782 Mixed hyperlipidemia: Secondary | ICD-10-CM

## 2021-05-13 NOTE — Telephone Encounter (Signed)
That is concerning-  ? ?this patient's data may be recorded under another patient's name and vice-versa.  ?Lets contact [facility] ASAP and demand correction. Also, the other patient is obviously not fully complaint but may have been declared complaint based on her data. If this is a [facility]/ [facility] sleep patient , we must be made aware.  ?Larey Seat, MD  ? ?CC; Michelle Hicks, for [facility].  ?

## 2021-05-26 ENCOUNTER — Ambulatory Visit: Payer: Medicare Other | Admitting: Neurology

## 2021-05-26 ENCOUNTER — Encounter: Payer: Self-pay | Admitting: Neurology

## 2021-05-26 VITALS — BP 154/86 | HR 89 | Ht 64.0 in | Wt 247.0 lb

## 2021-05-26 DIAGNOSIS — K219 Gastro-esophageal reflux disease without esophagitis: Secondary | ICD-10-CM

## 2021-05-26 DIAGNOSIS — R0681 Apnea, not elsewhere classified: Secondary | ICD-10-CM | POA: Diagnosis not present

## 2021-05-26 DIAGNOSIS — F5104 Psychophysiologic insomnia: Secondary | ICD-10-CM | POA: Diagnosis not present

## 2021-05-26 DIAGNOSIS — G4733 Obstructive sleep apnea (adult) (pediatric): Secondary | ICD-10-CM | POA: Insufficient documentation

## 2021-05-26 DIAGNOSIS — Z9989 Dependence on other enabling machines and devices: Secondary | ICD-10-CM | POA: Diagnosis not present

## 2021-05-26 DIAGNOSIS — Z6841 Body Mass Index (BMI) 40.0 and over, adult: Secondary | ICD-10-CM

## 2021-05-26 MED ORDER — TRAZODONE HCL 50 MG PO TABS: 50.0000 mg | ORAL_TABLET | Freq: Every evening | ORAL | 3 refills | Status: DC | PRN

## 2021-05-26 NOTE — Patient Instructions (Signed)
Quality Sleep Information, Adult ?Quality sleep is important for your mental and physical health. It also improves your quality of life. Quality sleep means you: ?Are asleep for most of the time you are in bed. ?Fall asleep within 30 minutes. ?Wake up no more than once a night.  ?Are awake for no longer than 20 minutes if you do wake up during the night. ?Most adults need 7-8 hours of quality sleep each night. ?How can poor sleep affect me? ?If you do not get enough quality sleep, you may have: ?Mood swings. ?Daytime sleepiness. ?Confusion. ?Decreased reaction time. ?Sleep disorders, such as insomnia and sleep apnea. ?Difficulty with: ?Solving problems. ?Coping with stress. ?Paying attention. ?These issues may affect your performance and productivity at work, school, and at home. Lack of sleep may also put you at higher risk for accidents, suicide, and risky behaviors. ?If you do not get quality sleep you may also be at higher risk for several health problems, including: ?Infections. ?Type 2 diabetes. ?Heart disease. ?High blood pressure. ?Obesity. ?Worsening of long-term conditions, like arthritis, kidney disease, depression, Parkinson's disease, and epilepsy. ?What actions can I take to get more quality sleep? ?  ?Stick to a sleep schedule. Go to sleep and wake up at about the same time each day. Do not try to sleep less on weekdays and make up for lost sleep on weekends. This does not work. ?Try to get about 30 minutes of exercise on most days. Do not exercise 2-3 hours before going to bed. ?Limit naps during the day to 30 minutes or less. ?Do not use any products that contain nicotine or tobacco, such as cigarettes or e-cigarettes. If you need help quitting, ask your health care provider. ?Do not drink caffeinated beverages for at least 8 hours before going to bed. Coffee, tea, and some sodas contain caffeine. ?Do not drink alcohol close to bedtime. ?Do not eat large meals close to bedtime. ?Do not take naps in  the late afternoon. ?Try to get at least 30 minutes of sunlight every day. Morning sunlight is best. ?Make time to relax before bed. Reading, listening to music, or taking a hot bath promotes quality sleep. ?Make your bedroom a place that promotes quality sleep. Keep your bedroom dark, quiet, and at a comfortable room temperature. Make sure your bed is comfortable. Take out sleep distractions like TV, a computer, smartphone, and bright lights. ?If you are lying awake in bed for longer than 20 minutes, get up and do a relaxing activity until you feel sleepy. ?Work with your health care provider to treat medical conditions that may affect sleeping, such as: ?Nasal obstruction. ?Snoring. ?Sleep apnea and other sleep disorders. ?Talk to your health care provider if you think any of your prescription medicines may cause you to have difficulty falling or staying asleep. ?If you have sleep problems, talk with a sleep consultant. If you think you have a sleep disorder, talk with your health care provider about getting evaluated by a specialist. ?Where to find more information ?[place] website: https://sleepfoundation.org ?National Heart, Lung, and [place] ([place]): http://www.saunders.info/.pdf ?Centers for Disease Control and Prevention (CDC): LearningDermatology.pl ?Contact a health care provider if you: ?Have trouble getting to sleep or staying asleep. ?Often wake up very early in the morning and cannot get back to sleep. ?Have daytime sleepiness. ?Have daytime sleep attacks of suddenly falling asleep and sudden muscle weakness (narcolepsy). ?Have a tingling sensation in your legs with a strong urge to move your legs (restless  legs syndrome). ?Stop breathing briefly during sleep (sleep apnea). ?Think you have a sleep disorder or are taking a medicine that is affecting your quality of sleep. ?Summary ?Most adults need 7-8 hours of quality sleep each  night. ?Getting enough quality sleep is an important part of health and well-being. ?Make your bedroom a place that promotes quality sleep and avoid things that may cause you to have poor sleep, such as alcohol, caffeine, smoking, and large meals. ?Talk to your health care provider if you have trouble falling asleep or staying asleep. ?This information is not intended to replace advice given to you by your health care provider. Make sure you discuss any questions you have with your health care provider. ?Document Revised: 05/09/2017 Document Reviewed: 05/09/2017 ?Elsevier Patient Education ? [place]. ?Insomnia ?Insomnia is a sleep disorder that makes it difficult to fall asleep or stay asleep. Insomnia can cause fatigue, low energy, difficulty concentrating, mood swings, and poor performance at work or school. ?There are three different ways to classify insomnia: ?Difficulty falling asleep. ?Difficulty staying asleep. ?Waking up too early in the morning. ?Any type of insomnia can be long-term (chronic) or short-term (acute). Both are common. Short-term insomnia usually lasts for three months or less. Chronic insomnia occurs at least three times a week for longer than three months. ?What are the causes? ?Insomnia may be caused by another condition, situation, or substance, such as: ?Anxiety. ?Certain medicines. ?Gastroesophageal reflux disease (GERD) or other gastrointestinal conditions. ?Asthma or other breathing conditions. ?Restless legs syndrome, sleep apnea, or other sleep disorders. ?Chronic pain. ?Menopause. ?Stroke. ?Abuse of alcohol, tobacco, or illegal drugs. ?Mental health conditions, such as depression. ?Caffeine. ?Neurological disorders, such as Alzheimer's disease. ?An overactive thyroid (hyperthyroidism). ?Sometimes, the cause of insomnia may not be known. ?What increases the risk? ?Risk factors for insomnia include: ?Gender. Women are affected more often than men. ?Age. Insomnia is more  common as you get older. ?Stress. ?Lack of exercise. ?Irregular work schedule or working night shifts. ?Traveling between different time zones. ?Certain medical and mental health conditions. ?What are the signs or symptoms? ?If you have insomnia, the main symptom is having trouble falling asleep or having trouble staying asleep. This may lead to other symptoms, such as: ?Feeling fatigued or having low energy. ?Feeling nervous about going to sleep. ?Not feeling rested in the morning. ?Having trouble concentrating. ?Feeling irritable, anxious, or depressed. ?How is this diagnosed? ?This condition may be diagnosed based on: ?Your symptoms and medical history. Your health care provider may ask about: ?Your sleep habits. ?Any medical conditions you have. ?Your mental health. ?A physical exam. ?How is this treated? ?Treatment for insomnia depends on the cause. Treatment may focus on treating an underlying condition that is causing insomnia. Treatment may also include: ?Medicines to help you sleep. ?Counseling or therapy. ?Lifestyle adjustments to help you sleep better. ?Follow these instructions at home: ?Eating and drinking ? ?Limit or avoid alcohol, caffeinated beverages, and cigarettes, especially close to bedtime. These can disrupt your sleep. ?Do not eat a large meal or eat spicy foods right before bedtime. This can lead to digestive discomfort that can make it hard for you to sleep. ?Sleep habits ? ?Keep a sleep diary to help you and your health care provider figure out what could be causing your insomnia. Write down: ?When you sleep. ?When you wake up during the night. ?How well you sleep. ?How rested you feel the next day. ?Any side effects of medicines you are taking. ?What you  eat and drink. ?Make your bedroom a dark, comfortable place where it is easy to fall asleep. ?Put up shades or blackout curtains to block light from outside. ?Use a white noise machine to block noise. ?Keep the temperature cool. ?Limit  screen use before bedtime. This includes: ?Watching TV. ?Using your smartphone, tablet, or computer. ?Stick to a routine that includes going to bed and waking up at the same times every day and night. This can help you

## 2021-05-26 NOTE — Progress Notes (Signed)
? ? ?PATIENT: Michelle Hicks ?DOB: 1950/02/17 ? ?REASON FOR VISIT: follow up ?HISTORY FROM: patient ?PRIMARY NEUROLOGIST: Dr. Brett Fairy ? ?HISTORY OF PRESENT ILLNESS: ?Today 05/26/21: ? ?Interval history : She is 1 day Michelle Hicks is a 71 year old African-American female patient was diagnosed with severe sleep apnea by an apnea [facility] home sleep test in 2022.  She received an auto titration CPAP by [employer] and is doing very well : high compliance and efficacy are documented. ?She is using a nasal pillow her residual AHI is 0.6, she is using a pressure between 6 and 16 cmH2O with 3 cm EPR.  Set up date was in summer 2022.  I do not have to change the settings or the interface.  The other concern has been insomnia the feeling of constantly having to go to the bathroom out of fear of enuresis.  This was not present when the patient used Benadryl but Benadryl of course has other side effects including memory interference that we try to eliminate.  She is taking trazodone now which is a nonaddictive medication that is not anticholinergic but since then she has slept somewhat better but her urinary urges of the feeling that she has to go and urinate have become more frequent.  So I like for her to continue trazodone and 50 mg but also to make sure that she eliminates all urine from her bladder before she goes to bed.  That she takes her diuretic hydrochlorothiazide in the morning not at night.  And that she restrict her fluid intake for the last 2 hours before bedtime.  I think that may help more.  We also discussed some sleep hygiene changes as she still struggles somewhat with insomnia and one of them is to eliminate screen nights and bluelight from the bedroom.  She has been working on it and I think she will make great progress.  She had been placed on Ozempic in order to prevent her previously uncontrolled diabetes  type 2 from affecting her  but also as a weight loss drug. ?She reports her last HbA1c was 6.8.  ?She  reports a lot of constipation and gastroparesis. Normal barium swallowing test reported. ? ? ?MM: Michelle Hicks is a 71 year old female with a history of obstructive sleep apnea on CPAP.  She reports that she is tolerating the CPAP well.  She states that she still wakes up approximately 1 time a night to urinate.  When she does get up she typically has trouble falling back asleep and will watch television.  She also watches television before bedtime.  She has trazodone but does not use it consistently.  She is unsure of the benefit.  She returns today for an evaluation. ? ? ? ?HISTORY (Copied from Dr.Eleisha Branscomb's note) 04/26/20: Michelle Hicks is a 71 year -old  African American female patient and is seen here upon referral  by dDr Baird Cancer on 04/26/2020 for a sleep consultation. Marland Kitchen  ?Chief concern according to patient :  Presents to address insomnia concerns. Last SS 10+ yrs ago and was negative for OSA. She states that she wakes up during the night a lot. Her husband told her she snores. She has woke up trying to catch breath but doesn't happen often. Avg 5-6 hrs of broken sleep, noy restoring. ? Michelle Hicks  has a past medical history of : Dysphagia due to GERD, hyperlipidemia, Hypothyroidsism, Depression, DM type 2 on Ozempic(diabetes mellitus) with Diabetic Neuropathy, Morbid Obesity ([place]), and HTN (hypertension). ?  ?  The patient had the first sleep study in the year 2010 or circe , she barely slept and felt is may not be valid result.   ?Sleep relevant medical history: Nocturia 1-2, yes to Tonsillectomy; frequent Sinusitis with headaches . ?Family medical /sleep history: No other family member on CPAP with OSA, insomnia, sleep walkers.  ? ? ?REVIEW OF SYSTEMS: Out of a complete 14 system review of symptoms, the patient complains only of the following symptoms, and all other reviewed systems are negative. ? ?ESS 6 ?FSS 15 ? ?ALLERGIES: ?Allergies  ?Allergen Reactions  ? Sulfa Antibiotics   ?  Sulfamethoxazole-Trimethoprim   ?  Other reaction(s): Unknown  ? Other Rash  ? ? ?HOME MEDICATIONS: ?Outpatient Medications Prior to Visit  ?Medication Sig Dispense Refill  ? aspirin EC 81 MG tablet Take 81 mg by mouth daily. Some times    ? Cholecalciferol (VITAMIN D-3 PO) Take 2,000 Units by mouth daily.     ? hydrochlorothiazide (HYDRODIURIL) 25 MG tablet Take 1 tablet (25 mg total) by mouth daily. 90 tablet 3  ? Lancets Misc. (ONE TOUCH SURESOFT) MISC Use as directed to check blood sugars 2 times per day dx e11.65 300 each 2  ? levothyroxine (SYNTHROID) 50 MCG tablet Take 50 mcg by mouth daily.     ? LINZESS 290 MCG CAPS capsule Take 290 mcg by mouth every morning.    ? meloxicam (MOBIC) 15 MG tablet TAKE 1 TABLET BY MOUTH  DAILY AS NEEDED 90 tablet 1  ? Multiple Vitamin (MULTIVITAMIN) tablet Take 1 tablet by mouth daily.    ? omeprazole (PRILOSEC) 40 MG capsule TAKE 1 CAPSULE BY MOUTH  DAILY BEFORE A MEAL 90 capsule 2  ? ONETOUCH ULTRA test strip USE AS DIRECTED TO CHECK  BLOOD SUGAR TWICE DAILY 200 strip 3  ? Semaglutide, 1 MG/DOSE, (OZEMPIC, 1 MG/DOSE,) 2 MG/1.5ML SOPN Inject 1 mg into the skin once a week. 4.5 mL 3  ? telmisartan (MICARDIS) 80 MG tablet TAKE 1 TABLET BY MOUTH  DAILY 90 tablet 3  ? traZODone (DESYREL) 50 MG tablet Take 1 tablet (50 mg total) by mouth at bedtime as needed for sleep. 90 tablet 1  ? doxycycline (VIBRA-TABS) 100 MG tablet Take 1 tablet (100 mg total) by mouth 2 (two) times daily. 20 tablet 0  ? pravastatin (PRAVACHOL) 40 MG tablet TAKE 1 TABLET BY MOUTH  DAILY (Patient not taking: Reported on 04/19/2021) 90 tablet 3  ? ?No facility-administered medications prior to visit.  ? ? ?PAST MEDICAL HISTORY: ?Past Medical History:  ?Diagnosis Date  ? Depression   ? DM (diabetes mellitus) ([place])   ? HTN (hypertension)   ? ? ?PAST SURGICAL HISTORY: ?Past Surgical History:  ?Procedure Laterality Date  ? ABDOMINAL HYSTERECTOMY    ? TONSILLECTOMY Bilateral 1967  ? ? ?FAMILY HISTORY: ?Family  History  ?Problem Relation Age of Onset  ? Diabetes Mother   ? Pancreatic cancer Mother   ? Hypertension Father   ? Heart attack Father   ? ? ?SOCIAL HISTORY: ?Social History  ? ?Socioeconomic History  ? Marital status: Married  ?  Spouse name: Not on file  ? Number of children: Not on file  ? Years of education: Not on file  ? Highest education level: Not on file  ?Occupational History  ? Occupation: retired  ?Tobacco Use  ? Smoking status: Former  ?  Packs/day: 0.50  ?  Years: 15.00  ?  Pack years: 7.50  ?  Types: Cigarettes  ?  Quit date: 2002  ?  Years since quitting: 21.2  ?  Passive exposure: Past  ? Smokeless tobacco: Never  ?Vaping Use  ? Vaping Use: Never used  ?Substance and Sexual Activity  ? Alcohol use: Yes  ?  Comment: socially   ? Drug use: No  ? Sexual activity: Not Currently  ?  Partners: Male  ?  Birth control/protection: Surgical  ?Other Topics Concern  ? Not on file  ?Social History Narrative  ? Not on file  ? ?Social Determinants of Health  ? ?Financial Resource Strain: High Risk  ? Difficulty of Paying Living Expenses: Very hard  ?Food Insecurity: No Food Insecurity  ? Worried About Charity fundraiser in the Last Year: Never true  ? Ran Out of Food in the Last Year: Never true  ?Transportation Needs: No Transportation Needs  ? Lack of Transportation (Medical): No  ? Lack of Transportation (Non-Medical): No  ?Physical Activity: Inactive  ? Days of Exercise per Week: 0 days  ? Minutes of Exercise per Session: 0 min  ?Stress: No Stress Concern Present  ? Feeling of Stress : Only a little  ?Social Connections: Not on file  ?Intimate Partner Violence: Not on file  ? ? ? ? ?PHYSICAL EXAM ? ?Vitals:  ? 05/26/21 1032  ?BP: (!) 154/86  ?Pulse: 89  ?Weight: 247 lb (112 kg)  ?Height: '5\' 4"'$  (1.626 m)  ? ?Body mass index is 42.4 kg/m?. ? ?General: The patient is awake, alert and appears not in acute distress. The patient is well groomed. ?Head: Normocephalic, atraumatic. Neck is supple. Mallampati 3 plus,  scalloped tongue, prognathia.   ?neck circumference:16. 5 inches . Nasal airflow patent.  ? ?Dental status: crowded ?Cardiovascular:  Regular rate and cardiac rhythm by pulse,  without distended neck veins. ?Respiratory: Lungs

## 2021-05-30 ENCOUNTER — Encounter (HOSPITAL_BASED_OUTPATIENT_CLINIC_OR_DEPARTMENT_OTHER): Payer: Self-pay | Admitting: Cardiovascular Disease

## 2021-05-30 ENCOUNTER — Ambulatory Visit (HOSPITAL_BASED_OUTPATIENT_CLINIC_OR_DEPARTMENT_OTHER): Payer: Medicare Other | Admitting: Cardiovascular Disease

## 2021-05-30 ENCOUNTER — Ambulatory Visit: Payer: Medicare Other | Admitting: Neurology

## 2021-05-30 DIAGNOSIS — R000 Tachycardia, unspecified: Secondary | ICD-10-CM | POA: Insufficient documentation

## 2021-05-30 DIAGNOSIS — G4733 Obstructive sleep apnea (adult) (pediatric): Secondary | ICD-10-CM

## 2021-05-30 DIAGNOSIS — Z9989 Dependence on other enabling machines and devices: Secondary | ICD-10-CM

## 2021-05-30 DIAGNOSIS — R0609 Other forms of dyspnea: Secondary | ICD-10-CM

## 2021-05-30 DIAGNOSIS — I10 Essential (primary) hypertension: Secondary | ICD-10-CM

## 2021-05-30 HISTORY — DX: Other forms of dyspnea: R06.09

## 2021-05-30 HISTORY — DX: Tachycardia, unspecified: R00.0

## 2021-05-30 MED ORDER — AMLODIPINE BESYLATE 5 MG PO TABS: 5.0000 mg | ORAL_TABLET | Freq: Every day | ORAL | 3 refills | Status: DC

## 2021-05-30 MED ORDER — METOPROLOL TARTRATE 100 MG PO TABS: ORAL_TABLET | ORAL | 0 refills | Status: DC

## 2021-05-30 NOTE — Assessment & Plan Note (Signed)
Uses CPAP faithfully. ?

## 2021-05-30 NOTE — Assessment & Plan Note (Addendum)
Blood pressure was above goal both initially and on repeat.  Recommended that she increase her exercise to at least 150 minutes weekly.  We also discussed limiting sodium intake to 2500 milligrams daily and limiting the amount of times that she eats out.  She will continue to limit caffeine intake.  She is going to add amlodipine 5 mg daily to her regimen.  Continue HCTZ and telmisartan.  Blood pressure goal is less than 130/80. ?

## 2021-05-30 NOTE — Assessment & Plan Note (Signed)
She has struggled with exertional dyspnea for the last 6 months.  I suspect this may be due to deconditioning and obesity.  However, especially given her family history and risk factors, we want to rule out obstructive coronary disease.  We will get a coronary CTA.  Continue aspirin for now.  She has not tolerated statins in the past.  If she has CAD will need to consider alternative agents.  We will also get an echo to evaluate for structural heart disease. ?

## 2021-05-30 NOTE — Patient Instructions (Addendum)
Medication Instructions:  ?START AMLODIPINE 5 MG DAILY  ? ?TAKE METOPROLOL 100 MG 2 HOURS PRIOR TO CARDIAC CT  ? ?Labwork: ?BMET 1 WEEK PRIOR TO CARDIAC CT ? ?Testing/Procedures: ?Your physician has requested that you have cardiac CT. Cardiac computed tomography (CT) is a painless test that uses an x-ray machine to take clear, detailed pictures of your heart. For further information please visit [email or web address]. Please follow instruction sheet as given. ?ONCE INSURANCE HAS BEEN REVIEWED THE OFFICE WILL CALL YOU TO SCHEDULE  ? ?Follow-Up: ?2 MONTHS WITH DR Oval Linsey OR CAITLIN NP  ? ?Any Other Special Instructions Will Be Listed Below (If Applicable). ? ? ? ?Your cardiac CT will be scheduled at one of the below locations:  ? ?[facility] ?[address] ?[place], [place] [address] ?[phone] ? ?OR ? ?[place] ?[address] ?[address] ?[place], [place] [address] ?[phone] ? ?If scheduled at [facility], please arrive at the [facility] (Entrance C2) of [facility] 30 minutes prior to test start time. ?You can use the FREE valet parking offered at entrance C (encouraged to control the heart rate for the test)  ?Proceed to the [facility] Radiology Department (first floor) to check-in and test prep. ? ?All radiology patients and guests should use entrance C2 at [facility], accessed from [facility], even though the hospital's physical address listed is [address] ? ? ? ?If scheduled at [facility], please arrive 15 mins early for check-in and test prep. ? ?Please follow these instructions carefully (unless otherwise directed): ? ?Hold all erectile dysfunction medications at least 3 days (72 hrs) prior to test. ? ?On the Night Before the Test: ?Be sure to Drink plenty of water. ?Do not consume any caffeinated/decaffeinated beverages or chocolate 12 hours prior to  your test. ?Do not take any antihistamines 12 hours prior to your test. ?If the patient has contrast allergy: ?Patient will need a prescription for Prednisone and very clear instructions (as follows): ?Prednisone 50 mg - take 13 hours prior to test ?Take another Prednisone 50 mg 7 hours prior to test ?Take another Prednisone 50 mg 1 hour prior to test ?Take Benadryl 50 mg 1 hour prior to test ?Patient must complete all four doses of above prophylactic medications. ?Patient will need a ride after test due to Benadryl. ? ?On the Day of the Test: ?Drink plenty of water until 1 hour prior to the test. ?Do not eat any food 4 hours prior to the test. ?You may take your regular medications prior to the test.  ?Take metoprolol (Lopressor) two hours prior to test. ?HOLD Furosemide/Hydrochlorothiazide morning of the test. ?FEMALES- please wear underwire-free bra if available, avoid dresses & tight clothing ?     ?After the Test: ?Drink plenty of water. ?After receiving IV contrast, you may experience a mild flushed feeling. This is normal. ?On occasion, you may experience a mild rash up to 24 hours after the test. This is not dangerous. If this occurs, you can take Benadryl 25 mg and increase your fluid intake. ?If you experience trouble breathing, this can be serious. If it is severe call 911 IMMEDIATELY. If it is mild, please call our office. ?If you take any of these medications: Glipizide/Metformin, Avandament, Glucavance, please do not take 48 hours after completing test unless otherwise instructed. ? ?We will call to schedule your test 2-4 weeks out understanding that some insurance companies will  need an authorization prior to the service being performed.  ? ?For non-scheduling related questions, please contact the cardiac imaging nurse navigator should you have any questions/concerns: ?Marchia Bond, Cardiac Imaging Nurse Navigator ?Gordy Clement, Cardiac Imaging Nurse Navigator ?[facility] ?Direct Office Dial: [phone]  ? ?For scheduling needs, including cancellations and rescheduling, please call [place], [phone]. ? ? ?Cardiac CT Angiogram ?A cardiac CT angiogram is a procedure to look at the heart and the area around the heart. It may be done to help find the cause of chest pains or other symptoms of heart disease. During this procedure, a substance called contrast dye is injected into the blood vessels in the area to be checked. A large X-ray machine, called a CT scanner, then takes detailed pictures of the heart and the surrounding area. The procedure is also sometimes called a coronary CT angiogram, coronary artery scanning, or CTA. ?A cardiac CT angiogram allows the health care provider to see how well blood is flowing to and from the heart. The health care provider will be able to see if there are any problems, such as: ?Blockage or narrowing of the coronary arteries in the heart. ?Fluid around the heart. ?Signs of weakness or disease in the muscles, valves, and tissues of the heart. ?Tell a health care provider about: ?Any allergies you have. This is especially important if you have had a previous allergic reaction to contrast dye. ?All medicines you are taking, including vitamins, herbs, eye drops, creams, and over-the-counter medicines. ?Any blood disorders you have. ?Any surgeries you have had. ?Any medical conditions you have. ?Whether you are pregnant or may be pregnant. ?Any anxiety disorders, chronic pain, or other conditions you have that may increase your stress or prevent you from lying still. ?What are the risks? ?Generally, this is a safe procedure. However, problems may occur, including: ?Bleeding. ?Infection. ?Allergic reactions to medicines or dyes. ?Damage to other structures or organs. ?Kidney damage from the contrast dye that is used. ?Increased risk of cancer from radiation exposure. This risk is low. Talk with your health care provider about: ?The risks  and benefits of testing. ?How you can receive the lowest dose of radiation. ?What happens before the procedure? ?Wear comfortable clothing and remove any jewelry, glasses, dentures, and hearing aids. ?Follow instructions from your health care provider about eating and drinking. This may include: ?For 12 hours before the procedure -- avoid caffeine. This includes tea, coffee, soda, energy drinks, and diet pills. Drink plenty of water or other fluids that do not have caffeine in them. Being well hydrated can prevent complications. ?For 4-6 hours before the procedure -- stop eating and drinking. The contrast dye can cause nausea, but this is less likely if your stomach is empty. ?Ask your health care provider about changing or stopping your regular medicines. This is especially important if you are taking diabetes medicines, blood thinners, or medicines to treat problems with erections (erectile dysfunction). ?What happens during the procedure? ? ?Hair on your chest may need to be removed so that small sticky patches called electrodes can be placed on your chest. These will transmit information that helps to monitor your heart during the procedure. ?An IV will be inserted into one of your veins. ?You might be given a medicine to control your heart rate during the procedure. This will help to ensure that good images are obtained. ?You will be asked to lie on an exam table. This table will slide in and out  of the CT machine during the procedure. ?Contrast dye will be injected into the IV. You might feel warm, or you may get a metallic taste in your mouth. ?You will be given a medicine called nitroglycerin. This will relax or dilate the arteries in your heart. ?The table that you are lying on will move into the CT machine tunnel for the scan. ?The person running the machine will give you instructions while the scans are being done. You may be asked to: ?Keep your arms above your head. ?Hold your breath. ?Stay very still,  even if the table is moving. ?When the scanning is complete, you will be moved out of the machine. ?The IV will be removed. ?The procedure may vary among health care providers and hospitals. ?What can I expect a

## 2021-05-30 NOTE — Progress Notes (Signed)
?Cardiology Office Note:   ? ?Date:  05/30/2021  ? ?ID:  Michelle Hicks, DOB [birth year], MRN [id number] ? ?PCP:  Glendale Chard, MD ?  ?[facility] Providers ?Cardiologist:  None    ? ?Referring MD: Glendale Chard, MD  ? ?No chief complaint on file. ? ? ?History of Present Illness:   ? ?Michelle Hicks is a 71 y.o. female with a hx of hypertension, diabetes mellitus, and depression, here for the evaluation of shortness of breath. She saw Dr. Baird Cancer 03/2021 and noted she was more short of breath with daily activities. She was not getting much exercise at the time. She was referred to cardiology and encouraged to increase her physical activity once cleared.  ? ?Today, she reports noticing worsening shortness of breath in the past 6 months. This is occurring with exertion or daily activities. While cleaning around the house she is "laboring for breath." For exercise she has been walking (7.5 k - 10 k steps) and performing stretches. She gets her steps at work, home, or walking in the park. Also, she endorses symptoms of acid reflux that occur at any time, but she does not believe this discomfort is related to her heart. At home her blood pressure will fluctuate. She may have readings in the 150s/60s-70s, which will improve to the 130s when she checks again later. Her blood pressure has been as high as 250 systolic at other clinic visits. She was on previous antihypertensives, but she is not sure which, possibly amlodipine. Additionally, she has only noticed 2 episodes of racing heart rates because they were detected by her smart watch. These episodes lasted about 15 minutes. Regarding her diet, she orders out "a lot." She recognizes she likely consumes too much salt. Each day she may have up to 1.5 cups of coffee. At night she usually sleeps elevated, and uses her CPAP routinely. Typically she does not lie on her back as this may cause her to wake up needing to catch her breath. Of note, her statins were  discontinued due to myalgias and arthralgias. She denies any chest pain, or peripheral edema. No lightheadedness, headaches, syncope, or PND. ? ? ?Past Medical History:  ?Diagnosis Date  ? Depression   ? DM (diabetes mellitus) ([place])   ? Exertional dyspnea 05/30/2021  ? HTN (hypertension)   ? Tachycardia 05/30/2021  ? ? ?Past Surgical History:  ?Procedure Laterality Date  ? ABDOMINAL HYSTERECTOMY    ? TONSILLECTOMY Bilateral 1967  ? ? ?Current Medications: ?Current Meds  ?Medication Sig  ? amLODipine (NORVASC) 5 MG tablet Take 1 tablet (5 mg total) by mouth daily.  ? aspirin EC 81 MG tablet Take 81 mg by mouth daily. Some times  ? Cholecalciferol (VITAMIN D-3 PO) Take 2,000 Units by mouth daily.   ? hydrochlorothiazide (HYDRODIURIL) 25 MG tablet Take 1 tablet (25 mg total) by mouth daily.  ? Lancets Misc. (ONE TOUCH SURESOFT) MISC Use as directed to check blood sugars 2 times per day dx e11.65  ? levothyroxine (SYNTHROID) 50 MCG tablet Take 50 mcg by mouth daily.   ? LINZESS 290 MCG CAPS capsule Take 290 mcg by mouth every morning.  ? meloxicam (MOBIC) 15 MG tablet TAKE 1 TABLET BY MOUTH  DAILY AS NEEDED  ? metoprolol tartrate (LOPRESSOR) 100 MG tablet TAKE 1 TABLET 2 HOURS PRIOR TO CT  ? Multiple Vitamin (MULTIVITAMIN) tablet Take 1 tablet by mouth daily.  ? omeprazole (PRILOSEC) 40 MG capsule TAKE 1 CAPSULE BY MOUTH  DAILY  BEFORE A MEAL  ? ONETOUCH ULTRA test strip USE AS DIRECTED TO CHECK  BLOOD SUGAR TWICE DAILY  ? Semaglutide, 1 MG/DOSE, (OZEMPIC, 1 MG/DOSE,) 2 MG/1.5ML SOPN Inject 1 mg into the skin once a week.  ? telmisartan (MICARDIS) 80 MG tablet TAKE 1 TABLET BY MOUTH  DAILY  ? traZODone (DESYREL) 50 MG tablet Take 1 tablet (50 mg total) by mouth at bedtime as needed for sleep.  ?  ? ?Allergies:   Sulfa antibiotics, Sulfamethoxazole-trimethoprim, and Other  ? ?Social History  ? ?Socioeconomic History  ? Marital status: Married  ?  Spouse name: Not on file  ? Number of children: Not on file  ? Years of  education: Not on file  ? Highest education level: Not on file  ?Occupational History  ? Occupation: retired  ?Tobacco Use  ? Smoking status: Former  ?  Packs/day: 0.50  ?  Years: 15.00  ?  Pack years: 7.50  ?  Types: Cigarettes  ?  Quit date: 2002  ?  Years since quitting: 21.3  ?  Passive exposure: Past  ? Smokeless tobacco: Never  ?Vaping Use  ? Vaping Use: Never used  ?Substance and Sexual Activity  ? Alcohol use: Yes  ?  Comment: socially   ? Drug use: No  ? Sexual activity: Not Currently  ?  Partners: Male  ?  Birth control/protection: Surgical  ?Other Topics Concern  ? Not on file  ?Social History Narrative  ? Not on file  ? ?Social Determinants of Health  ? ?Financial Resource Strain: High Risk  ? Difficulty of Paying Living Expenses: Very hard  ?Food Insecurity: No Food Insecurity  ? Worried About Charity fundraiser in the Last Year: Never true  ? Ran Out of Food in the Last Year: Never true  ?Transportation Needs: No Transportation Needs  ? Lack of Transportation (Medical): No  ? Lack of Transportation (Non-Medical): No  ?Physical Activity: Inactive  ? Days of Exercise per Week: 0 days  ? Minutes of Exercise per Session: 0 min  ?Stress: No Stress Concern Present  ? Feeling of Stress : Only a little  ?Social Connections: Not on file  ?  ? ?Family History: ?The patient's family history includes Diabetes in her mother; Heart attack (age of onset: 47) in her father; Hypertension in her father; Pancreatic cancer in her mother. ? ?ROS:   ?Please see the history of present illness.    ?(+) Shortness of breath ?(+) Acid reflux ?All other systems reviewed and are negative. ? ?EKGs/Labs/Other Studies Reviewed:   ? ?The following studies were reviewed today: ? ?Left LE Venous Doppler 07/30/2020: ?Summary:  ? ?LEFT:  ?- There is no evidence of deep vein thrombosis in the lower extremity.  ?- There is no evidence of superficial venous thrombosis.  ?   ?- No cystic structure found in the popliteal fossa. ? ? ?EKG:   EKG  is personally reviewed. ?05/30/2021: Sinus rhythm. Rate 98 bpm. ? ?Recent Labs: ?10/06/2020: TSH 1.020 ?03/31/2021: ALT 33; BUN 17; Creatinine, Ser 0.74; Hemoglobin 11.2; Platelets 283; Potassium 4.2; Sodium 136  ? ?Recent Lipid Panel ?   ?Component Value Date/Time  ? CHOL 159 03/31/2021 1055  ? TRIG 96 03/31/2021 1055  ? HDL 63 03/31/2021 1055  ? CHOLHDL 2.5 03/31/2021 1055  ? [place] 78 03/31/2021 1055  ? ? ? ?Risk Assessment/Calculations:   ?  ? ?    ? ?Physical Exam:   ? ?Wt Readings from Last 3  Encounters:  ?05/30/21 249 lb 14.4 oz (113.4 kg)  ?05/26/21 247 lb (112 kg)  ?03/31/21 244 lb 14.9 oz (111.1 kg)  ?  ? ?VS:  BP (!) 150/72 (BP Location: Left Arm, Patient Position: Sitting, Cuff Size: Large)   Pulse 98   Ht '5\' 4"'$  (1.626 m)   Wt 249 lb 14.4 oz (113.4 kg)   BMI 42.90 kg/m?  , BMI Body mass index is 42.9 kg/m?. ?GENERAL:  Well appearing ?HEENT: Pupils equal round and reactive, fundi not visualized, oral mucosa unremarkable ?NECK:  No jugular venous distention, waveform within normal limits, carotid upstroke brisk and symmetric, no bruits, no thyromegaly, R thyroid nodules ?LYMPHATICS:  No cervical adenopathy ?LUNGS:  Clear to auscultation bilaterally ?HEART:  RRR.  PMI not displaced or sustained,S1 and S2 within normal limits, no S3, no S4, no clicks, no rubs, no murmurs ?ABD:  Flat, positive bowel sounds normal in frequency in pitch, no bruits, no rebound, no guarding, no midline pulsatile mass, no hepatomegaly, no splenomegaly ?EXT:  2 plus pulses throughout, no edema, no cyanosis no clubbing ?SKIN:  No rashes no nodules ?NEURO:  Cranial nerves II through XII grossly intact, motor grossly intact throughout ?PSYCH:  Cognitively intact, oriented to person place and time ? ? ?ASSESSMENT:   ? ?1. OSA on CPAP   ?2. Tachycardia   ?3. Primary hypertension   ?4. Exertional dyspnea   ? ?PLAN:   ? ?OSA on CPAP ?Uses CPAP faithfully. ? ?Tachycardia ?Two episodes of tachycardia lasting 10-15 minutes.  The episodes  occurred when she was sitting.  She has a cardia mobile device and will capture it next time this occurs.  Thyroid function and blood counts have been within normal limits. ? ?Hypertension ?Blood pressure was above goal

## 2021-05-30 NOTE — Assessment & Plan Note (Signed)
Two episodes of tachycardia lasting 10-15 minutes.  The episodes occurred when she was sitting.  She has a cardia mobile device and will capture it next time this occurs.  Thyroid function and blood counts have been within normal limits. ?

## 2021-05-31 ENCOUNTER — Telehealth: Payer: Medicare Other

## 2021-05-31 ENCOUNTER — Ambulatory Visit: Payer: Medicare Other | Admitting: Internal Medicine

## 2021-05-31 ENCOUNTER — Ambulatory Visit (INDEPENDENT_AMBULATORY_CARE_PROVIDER_SITE_OTHER): Payer: Medicare Other

## 2021-05-31 DIAGNOSIS — I10 Essential (primary) hypertension: Secondary | ICD-10-CM

## 2021-05-31 DIAGNOSIS — E1165 Type 2 diabetes mellitus with hyperglycemia: Secondary | ICD-10-CM

## 2021-05-31 DIAGNOSIS — E782 Mixed hyperlipidemia: Secondary | ICD-10-CM

## 2021-05-31 NOTE — Chronic Care Management (AMB) (Signed)
?Chronic Care Management  ? ?CCM RN Visit Note ? ?05/31/2021 ?Name: Michelle Hicks MRN: [id number] DOB: 30-Jul-1950 ? ?Subjective: ?Michelle Hicks is a 71 y.o. year old female who is a primary care patient of Glendale Chard, MD. The care management team was consulted for assistance with disease management and care coordination needs.   ? ?Engaged with patient by telephone for follow up visit in response to provider referral for case management and/or care coordination services.  ? ?Consent to Services:  ?The patient was given information about Chronic Care Management services, agreed to services, and gave verbal consent prior to initiation of services.  Please see initial visit note for detailed documentation.  ? ?Patient agreed to services and verbal consent obtained.  ? ?Assessment: Review of patient past medical history, allergies, medications, health status, including review of consultants reports, laboratory and other test data, was performed as part of comprehensive evaluation and provision of chronic care management services.  ? ?SDOH (Social Determinants of Health) assessments and interventions performed:  Yes, no acute needs  ? ?CCM Care Plan ? ?Allergies  ?Allergen Reactions  ? Sulfa Antibiotics   ? Sulfamethoxazole-Trimethoprim   ?  Other reaction(s): Unknown  ? Other Rash  ? ? ?Outpatient Encounter Medications as of 05/31/2021  ?Medication Sig  ? amLODipine (NORVASC) 5 MG tablet Take 1 tablet (5 mg total) by mouth daily.  ? aspirin EC 81 MG tablet Take 81 mg by mouth daily. Some times  ? Cholecalciferol (VITAMIN D-3 PO) Take 2,000 Units by mouth daily.   ? hydrochlorothiazide (HYDRODIURIL) 25 MG tablet Take 1 tablet (25 mg total) by mouth daily.  ? Lancets Misc. (ONE TOUCH SURESOFT) MISC Use as directed to check blood sugars 2 times per day dx e11.65  ? levothyroxine (SYNTHROID) 50 MCG tablet Take 50 mcg by mouth daily.   ? LINZESS 290 MCG CAPS capsule Take 290 mcg by mouth every morning.  ? meloxicam  (MOBIC) 15 MG tablet TAKE 1 TABLET BY MOUTH  DAILY AS NEEDED  ? metoprolol tartrate (LOPRESSOR) 100 MG tablet TAKE 1 TABLET 2 HOURS PRIOR TO CT  ? Multiple Vitamin (MULTIVITAMIN) tablet Take 1 tablet by mouth daily.  ? omeprazole (PRILOSEC) 40 MG capsule TAKE 1 CAPSULE BY MOUTH  DAILY BEFORE A MEAL  ? ONETOUCH ULTRA test strip USE AS DIRECTED TO CHECK  BLOOD SUGAR TWICE DAILY  ? Semaglutide, 1 MG/DOSE, (OZEMPIC, 1 MG/DOSE,) 2 MG/1.5ML SOPN Inject 1 mg into the skin once a week.  ? telmisartan (MICARDIS) 80 MG tablet TAKE 1 TABLET BY MOUTH  DAILY  ? traZODone (DESYREL) 50 MG tablet Take 1 tablet (50 mg total) by mouth at bedtime as needed for sleep.  ? ?No facility-administered encounter medications on file as of 05/31/2021.  ? ? ?Patient Active Problem List  ? Diagnosis Date Noted  ? Tachycardia 05/30/2021  ? Exertional dyspnea 05/30/2021  ? OSA on CPAP 05/26/2021  ? GERD with apnea 05/26/2021  ? Attention deficit hyperactivity disorder, predominantly inattentive type 12/06/2020  ? History of anemia 12/06/2020  ? Hyperglycemia due to type 2 diabetes mellitus ([place]) 12/06/2020  ? Hypothyroidism 12/06/2020  ? Menopausal symptom 12/06/2020  ? Non-toxic goiter 12/06/2020  ? Pure hypercholesterolemia 12/06/2020  ? Positive ANA (antinuclear antibody) 12/06/2020  ? Pain in left knee 12/06/2020  ? Chronic insomnia 04/26/2020  ? Snoring 04/26/2020  ? Class 3 severe obesity due to excess calories with serious comorbidity and body mass index (BMI) of 40.0 to 44.9 in adult (  [place]) 03/18/2019  ? Essential hypertension, benign 11/13/2017  ? Gastroesophageal reflux disease without esophagitis 11/13/2017  ? Hypertension 09/04/2017  ? Type II diabetes mellitus, uncontrolled 09/04/2017  ? S/P abdominal hysterectomy and left salpingo-oophorectomy 08/18/1994  ? ? ?Conditions to be addressed/monitored: HTN, DM II, HLD ? ?Care Plan : RN Care Manager Plan of Care  ?Updates made by Lynne Logan, RN since 05/31/2021 12:00 AM  ?  ? ?Problem:  No plan of care established for management of chronic disease states (HTN, DM II, HLD)   ?Priority: High  ?  ? ?Long-Range Goal: Development of plan of care for chronic disease states (HTN, DM II, HLD)   ?Start Date: 01/25/2021  ?Expected End Date: 01/25/2022  ?Recent Progress: On track  ?Priority: High  ?Note:   ?Current Barriers:  ?Knowledge Deficits related to plan of care for management of HTN, DM II, HLD  ?Chronic Disease Management support and education needs related to HTN, DM II, HLD  ? ?RNCM Clinical Goal(s):  ?Patient will verbalize basic understanding of  HTN, DM II, HLD disease process and self health management plan as evidenced by patient will report having no disease exacerbations related to her chronic disease states listed above ?take all medications exactly as prescribed and will call provider for medication related questions as evidenced by patient will report having no misses doses of her prescribed medications ?demonstrate Ongoing health management independence as evidenced by patient will report 100% adherence to her prescribed treatment plan  ?continue to work with RN Care Manager to address care management and care coordination needs related to  HTN, DM II, HLD as evidenced by adherence to CM Team Scheduled appointments ?demonstrate ongoing self health care management ability   as evidenced by    through collaboration with RN Care manager, provider, and care team.  ? ?Interventions: ?1:1 collaboration with primary care provider regarding development and update of comprehensive plan of care as evidenced by provider attestation and co-signature ?Inter-disciplinary care team collaboration (see longitudinal plan of care) ?Evaluation of current treatment plan related to  self management and patient's adherence to plan as established by provider ? ?Diabetes Interventions:  (Status:  Condition stable.  Not addressed this visit.) Long Term Goal ?Assessed patient's understanding of A1c goal:  <7% ?Reviewed medications with patient and discussed importance of medication adherence ?Counseled on importance of regular laboratory monitoring as prescribed ?Advised patient, providing education and rationale, to check cbg daily before breakfast and at bedtime and record, calling PCP for findings outside established parameters ?Review of patient status, including review of consultants reports, relevant laboratory and other test results, and medications completed ?Educated patient on dietary and exercise recommendations  ?Technical sales engineer related Carb Choices, Meal Planning and Chair Exercises ?Lab Results  ?Component Value Date  ? HGBA1C 7.3 (H) 03/31/2021  ? ?Hypertension Interventions:  (Status:  Goal on track:  Yes.) Long Term Goal ?Last practice recorded BP readings:  ?BP Readings from Last 3 Encounters:  ?05/30/21 (!) 150/72  ?05/26/21 (!) 154/86  ?03/31/21 120/82  ?Most recent eGFR/CrCl:  ?Lab Results  ?Component Value Date  ? EGFR 88 03/31/2021  ?  No components found for: CRCL ?Evaluation of current treatment plan related to hypertension self management and patient's adherence to plan as established by provider ?Determined patient completed a follow up visit with Dr. Oval Linsey, Cardiologist on 05/30/21, with the following Assessment/Plan reviewed and discussed: ?ASSESSMENT:   ? ?1. OSA on CPAP   ?2. Tachycardia   ?3. Primary hypertension   ?  4. Exertional dyspnea   ? ?PLAN:   ?OSA on CPAP ?Uses CPAP faithfully. ?Tachycardia ?Two episodes of tachycardia lasting 10-15 minutes.  The episodes occurred when she was sitting.  She has a cardia mobile device and will capture it next time this occurs.  Thyroid function and blood counts have been within normal limits. ?Hypertension ?Blood pressure was above goal both initially and on repeat.  Recommended that she increase her exercise to at least 150 minutes weekly.  We also discussed limiting sodium intake to 2500 milligrams daily and limiting the  amount of times that she eats out.  She will continue to limit caffeine intake.  She is going to add amlodipine 5 mg daily to her regimen.  Continue HCTZ and telmisartan.  Blood pressure goal is less than 130/80

## 2021-05-31 NOTE — Patient Instructions (Signed)
Visit Information ? ?Thank you for taking time to visit with me today. Please don't hesitate to contact me if I can be of assistance to you before our next scheduled telephone appointment. ? ?Following are the goals we discussed today:  ?(Copy and paste patient goals from clinical care plan here) ? ?Our next appointment is by telephone on 08/02/21 at 10:30 AM ? ?Please call the care guide team at [phone] if you need to cancel or reschedule your appointment.  ? ?If you are experiencing a Mental Health or [place] or need someone to talk to, please call [phone]-TALK (toll free, 24 hour hotline)  ? ?Patient verbalizes understanding of instructions and care plan provided today and agrees to view in [place]. Active MyChart status confirmed with patient.   ? ?Barb Merino, RN, BSN, CCM ?Care Management Coordinator ?[facility]  ?Direct Phone: [phone] ? ? ?

## 2021-06-01 ENCOUNTER — Telehealth (HOSPITAL_BASED_OUTPATIENT_CLINIC_OR_DEPARTMENT_OTHER): Payer: Self-pay | Admitting: Cardiovascular Disease

## 2021-06-01 NOTE — Telephone Encounter (Signed)
Left message for patient to call and discuss scheduling the Echocardiogram ordered by Dr. [place]o [place] 

## 2021-06-03 DIAGNOSIS — R0609 Other forms of dyspnea: Secondary | ICD-10-CM | POA: Diagnosis not present

## 2021-06-04 LAB — BASIC METABOLIC PANEL WITH GFR
BUN/Creatinine Ratio: 14 (ref 12–28)
BUN: 11 mg/dL (ref 8–27)
CO2: 19 mmol/L — ABNORMAL LOW (ref 20–29)
Calcium: 9.7 mg/dL (ref 8.7–10.3)
Chloride: 100 mmol/L (ref 96–106)
Creatinine, Ser: 0.81 mg/dL (ref 0.57–1.00)
Glucose: 135 mg/dL — ABNORMAL HIGH (ref 70–99)
Potassium: 4.1 mmol/L (ref 3.5–5.2)
Sodium: 137 mmol/L (ref 134–144)
eGFR: 79 mL/min/1.73

## 2021-06-06 ENCOUNTER — Encounter: Payer: Self-pay | Admitting: Internal Medicine

## 2021-06-06 ENCOUNTER — Ambulatory Visit (INDEPENDENT_AMBULATORY_CARE_PROVIDER_SITE_OTHER): Payer: Medicare Other | Admitting: Internal Medicine

## 2021-06-06 VITALS — BP 138/80 | HR 102 | Temp 98.2°F | Ht 64.0 in | Wt 249.8 lb

## 2021-06-06 DIAGNOSIS — Z6841 Body Mass Index (BMI) 40.0 and over, adult: Secondary | ICD-10-CM

## 2021-06-06 DIAGNOSIS — N393 Stress incontinence (female) (male): Secondary | ICD-10-CM

## 2021-06-06 DIAGNOSIS — M791 Myalgia, unspecified site: Secondary | ICD-10-CM | POA: Diagnosis not present

## 2021-06-06 DIAGNOSIS — E039 Hypothyroidism, unspecified: Secondary | ICD-10-CM | POA: Diagnosis not present

## 2021-06-06 DIAGNOSIS — E782 Mixed hyperlipidemia: Secondary | ICD-10-CM | POA: Diagnosis not present

## 2021-06-06 NOTE — Patient Instructions (Addendum)
Pelvic Floor Dysfunction, Female ? ?Pelvic floor dysfunction (PFD) is a condition that results when the group of muscles and connective tissues that support the organs in the pelvis (pelvic floor muscles) do not work well. These muscles and their connections form a sling that supports the colon and bladder. In women, they also support the uterus. ?PFD causes pelvic floor muscles to be too weak, too tight, or both. In PFD, muscle movements are not coordinated. This may cause bowel or bladder problems. It may also cause pain. ?What are the causes? ?This condition may be caused by an injury to the pelvic area or by a weakening of pelvic muscles. This often results from pregnancy and childbirth or other types of strain. In many cases, the exact cause is not known. ?What increases the risk? ?The following factors may make you more likely to develop this condition: ?Having chronic bladder tissue inflammation (interstitial cystitis). ?Being an older person. ?Being overweight. ?History of radiation treatment for cancer in the pelvic region. ?Previous pelvic surgery, such as removal of the uterus (hysterectomy). ?What are the signs or symptoms? ?Symptoms of this condition vary and may include: ?Bladder symptoms, such as: ?Trouble starting urination and emptying the bladder. ?Frequent urinary tract infections. ?Leaking urine when coughing, laughing, or exercising (stress incontinence). ?Having to pass urine urgently or frequently. ?Pain when passing urine. ?Bowel symptoms, such as: ?Constipation. ?Urgent or frequent bowel movements. ?Incomplete bowel movements. ?Painful bowel movements. ?Leaking stool or gas. ?Unexplained genital or rectal pain. ?Genital or rectal muscle spasms. ?Low back pain. ?Other symptoms may include: ?A heavy, full, or aching feeling in the vagina. ?A bulge that protrudes into the vagina. ?Pain during or after sex. ?How is this diagnosed? ?This condition may be diagnosed based on: ?Your symptoms and  medical history. ?A physical exam. During the exam, your health care provider may check your pelvic muscles for tightness, spasm, pain, or weakness. This may include a rectal exam and a pelvic exam. ?In some cases, you may have diagnostic tests, such as: ?Electrical muscle function tests. ?Urine flow testing. ?X-ray tests of bowel function. ?Ultrasound of the pelvic organs. ?How is this treated? ?Treatment for this condition depends on the symptoms. Treatment options include: ?Physical therapy. This may include Kegel exercises to help relax or strengthen the pelvic floor muscles. ?Biofeedback. This type of therapy provides feedback on how tight your pelvic floor muscles are so that you can learn to control them. ?Internal or external massage therapy. ?A treatment that involves electrical stimulation of the pelvic floor muscles to help control pain (transcutaneous electrical nerve stimulation, or TENS). ?Sound wave therapy (ultrasound) to reduce muscle spasms. ?Medicines, such as: ?Muscle relaxants. ?Bladder control medicines. ?Surgery to reconstruct or support pelvic floor muscles may be an option if other treatments do not help. ?Follow these instructions at home: ?Activity ?Do your usual activities as told by your health care provider. Ask your health care provider if you should modify any activities. ?Do pelvic floor strengthening or relaxing exercises at home as told by your physical therapist. ?Lifestyle ?Maintain a healthy weight. ?Eat foods that are high in fiber, such as beans, whole grains, and fresh fruits and vegetables. ?Limit foods that are high in fat and processed sugars, such as fried or sweet foods. ?Manage stress with relaxation techniques such as yoga or meditation. ?General instructions ?If you have problems with leakage: ?Use absorbable pads or wear padded underwear. ?Wash frequently with mild soap. ?Keep your genital and anal area as clean and dry as  possible. ?Ask your health care provider if  you should try a barrier cream to prevent skin irritation. ?Take warm baths to relieve pelvic muscle tension or spasms. ?Take over-the-counter and prescription medicines only as told by your health care provider. ?Keep all follow-up visits. ?How is this prevented? ?The cause of PFD is not always known, but there are a few things you can do to reduce the risk of developing this condition, including: ?Staying at a healthy weight. ?Getting regular exercise. ?Managing stress. ?Contact a health care provider if: ?Your symptoms are not improving with home care. ?You have signs or symptoms of PFD that get worse at home. ?You develop new signs or symptoms. ?You have signs of a urinary tract infection, such as: ?Fever. ?Chills. ?Increased urinary frequency. ?A burning feeling when urinating. ?You have not had a bowel movement in 3 days (constipation). ?Summary ?Pelvic floor dysfunction results when the muscles and connective tissues in your pelvic floor do not work well. ?These muscles and their connections form a sling that supports your colon and bladder. In women, they also support the uterus. ?PFD may be caused by an injury to the pelvic area or by a weakening of pelvic muscles. ?PFD causes pelvic floor muscles to be too weak, too tight, or a combination of both. Symptoms may vary from person to person. ?In most cases, PFD can be treated with physical therapies and medicines. Surgery may be an option if other treatments do not help. ?This information is not intended to replace advice given to you by your health care provider. Make sure you discuss any questions you have with your health care provider. ?Document Revised: 06/09/2020 Document Reviewed: 06/09/2020 ?Elsevier Patient Education ? [place]. ? ? ?High Cholesterol ? ?High cholesterol is a condition in which the blood has high levels of a white, waxy substance similar to fat (cholesterol). The liver makes all the cholesterol that the body needs. The human  body needs small amounts of cholesterol to help build cells. A person gets extra or excess cholesterol from the food that he or she eats. ?The blood carries cholesterol from the liver to the rest of the body. If you have high cholesterol, deposits (plaques) may build up on the walls of your arteries. Arteries are the blood vessels that carry blood away from your heart. These plaques make the arteries narrow and stiff. ?Cholesterol plaques increase your risk for heart attack and stroke. Work with your health care provider to keep your cholesterol levels in a healthy range. ?What increases the risk? ?The following factors may make you more likely to develop this condition: ?Eating foods that are high in animal fat (saturated fat) or cholesterol. ?Being overweight. ?Not getting enough exercise. ?A family history of high cholesterol (familial hypercholesterolemia). ?Use of tobacco products. ?Having diabetes. ?What are the signs or symptoms? ?In most cases, high cholesterol does not usually cause any symptoms. ?In severe cases, very high cholesterol levels can cause: ?Fatty bumps under the skin (xanthomas). ?A white or gray ring around the black center (pupil) of the eye. ?How is this diagnosed? ?This condition may be diagnosed based on the results of a blood test. ?If you are older than 69 years of age, your health care provider may check your cholesterol levels every 4-6 years. ?You may be checked more often if you have high cholesterol or other risk factors for heart disease. ?The blood test for cholesterol measures: ?"Bad" cholesterol, or LDL cholesterol. This is the main type of cholesterol that  causes heart disease. The desired level is less than 100 mg/dL (2.59 mmol/L). ?"Good" cholesterol, or HDL cholesterol. HDL helps protect against heart disease by cleaning the arteries and carrying the LDL to the liver for processing. The desired level for HDL is 60 mg/dL (1.55 mmol/L) or higher. ?Triglycerides. These are  fats that your body can store or burn for energy. The desired level is less than 150 mg/dL (1.69 mmol/L). ?Total cholesterol. This measures the total amount of cholesterol in your blood and includes LDL, HD

## 2021-06-06 NOTE — Progress Notes (Signed)
?Rich Brave [place],acting as a Education administrator for [name], MD.,have documented all relevant documentation on the behalf of [name], MD,as directed by  [name], MD while in the presence of [name], MD.  ?This visit occurred during the SARS-CoV-2 public health emergency.  Safety protocols were in place, including screening questions prior to the visit, additional usage of staff PPE, and extensive cleaning of exam room while observing appropriate contact time as indicated for disinfecting solutions. ? ?Subjective:  ?  ? Patient ID: Michelle Hicks , female    DOB: 06/04/1950 , 71 y.o.   MRN: [id number] ? ? ?Chief Complaint  ?Patient presents with  ? Hyperlipidemia  ? ? ?HPI ? ?Pt presents today for a cholesterol f/u. She reports she was taking pravastatin but it was causing her joint stiffness and aches so she stopped taking it. She reports the muscle pain/stiffness has persisted despite stopping the medication. She has difficulty walking up/down steps and has pain with getting up from a seated position.  ?  ? ?Past Medical History:  ?Diagnosis Date  ? Depression   ? DM (diabetes mellitus) ([place])   ? Exertional dyspnea 05/30/2021  ? HTN (hypertension)   ? Tachycardia 05/30/2021  ?  ? ?Family History  ?Problem Relation Age of Onset  ? Diabetes Mother   ? Pancreatic cancer Mother   ? Hypertension Father   ? Heart attack Father 60  ? ? ? ?Current Outpatient Medications:  ?  amLODipine (NORVASC) 5 MG tablet, Take 1 tablet (5 mg total) by mouth daily., Disp: 90 tablet, Rfl: 3 ?  aspirin EC 81 MG tablet, Take 81 mg by mouth daily. Some times, Disp: , Rfl:  ?  Cholecalciferol (VITAMIN D-3 PO), Take 2,000 Units by mouth daily. , Disp: , Rfl:  ?  hydrochlorothiazide (HYDRODIURIL) 25 MG tablet, Take 1 tablet (25 mg total) by mouth daily., Disp: 90 tablet, Rfl: 3 ?  Lancets Misc. (ONE TOUCH SURESOFT) MISC, Use as directed to check blood sugars 2 times per day dx e11.65, Disp: 300 each, Rfl: 2 ?   levothyroxine (SYNTHROID) 50 MCG tablet, Take 50 mcg by mouth daily. , Disp: , Rfl:  ?  LINZESS 290 MCG CAPS capsule, Take 290 mcg by mouth every morning., Disp: , Rfl:  ?  meloxicam (MOBIC) 15 MG tablet, TAKE 1 TABLET BY MOUTH  DAILY AS NEEDED, Disp: 90 tablet, Rfl: 1 ?  metoprolol tartrate (LOPRESSOR) 100 MG tablet, TAKE 1 TABLET 2 HOURS PRIOR TO CT, Disp: 1 tablet, Rfl: 0 ?  Multiple Vitamin (MULTIVITAMIN) tablet, Take 1 tablet by mouth daily., Disp: , Rfl:  ?  omeprazole (PRILOSEC) 40 MG capsule, TAKE 1 CAPSULE BY MOUTH  DAILY BEFORE A MEAL, Disp: 90 capsule, Rfl: 2 ?  ONETOUCH ULTRA test strip, USE AS DIRECTED TO CHECK  BLOOD SUGAR TWICE DAILY, Disp: 200 strip, Rfl: 3 ?  Semaglutide, 1 MG/DOSE, (OZEMPIC, 1 MG/DOSE,) 2 MG/1.5ML SOPN, Inject 1 mg into the skin once a week., Disp: 4.5 mL, Rfl: 3 ?  telmisartan (MICARDIS) 80 MG tablet, TAKE 1 TABLET BY MOUTH  DAILY, Disp: 90 tablet, Rfl: 3 ?  traZODone (DESYREL) 50 MG tablet, Take 1 tablet (50 mg total) by mouth at bedtime as needed for sleep., Disp: 90 tablet, Rfl: 3  ? ?Allergies  ?Allergen Reactions  ? Sulfa Antibiotics   ? Sulfamethoxazole-Trimethoprim   ?  Other reaction(s): Unknown  ? Other Rash  ?  ? ?Review of Systems  ?  Constitutional: Negative.   ?Respiratory: Negative.    ?Cardiovascular: Negative.   ?Gastrointestinal: Negative.   ?Musculoskeletal:  Positive for myalgias.  ?Neurological: Negative.   ?Psychiatric/Behavioral: Negative.     ? ?Today's Vitals  ? 06/06/21 1444  ?BP: 138/80  ?Pulse: (!) 102  ?Temp: 98.2 ?F (36.8 ?C)  ?Weight: 249 lb 12.8 oz (113.3 kg)  ?Height: _0  (1.626 m)  ?PainSc: 0-No pain  ? ?Body mass index is 42.88 kg/m?.  ?Wt Readings from Last 3 Encounters:  ?06/06/21 249 lb 12.8 oz (113.3 kg)  ?05/30/21 249 lb 14.4 oz (113.4 kg)  ?05/26/21 247 lb (112 kg)  ?  ? ?Objective:  ?Physical Exam ?Vitals and nursing note reviewed.  ?Constitutional:   ?   Appearance: Normal appearance. She is obese.  ?HENT:  ?   Head: Normocephalic and  atraumatic.  ?Eyes:  ?   Extraocular Movements: Extraocular movements intact.  ?Cardiovascular:  ?   Rate and Rhythm: Normal rate and regular rhythm.  ?   Heart sounds: Normal heart sounds.  ?Pulmonary:  ?   Effort: Pulmonary effort is normal.  ?   Breath sounds: Normal breath sounds.  ?Musculoskeletal:  ?   Cervical back: Normal range of motion.  ?Skin: ?   General: Skin is warm.  ?Neurological:  ?   General: No focal deficit present.  ?   Mental Status: She is alert.  ?Psychiatric:     ?   Mood and Affect: Mood normal.     ?   Behavior: Behavior normal.  ?    ?Assessment And Plan:  ?   ?1. Myalgia ?Comments: Not relieved w/ cessation of statin use. I will check labs as below. Will consider Rheum eval for polymyalgia if her sx persist.  ?- Sed Rate (ESR) ?- ANA, IFA (with reflex) ?- CK, total ? ?2. Mixed hyperlipidemia ?Comments: She agrees to referral for lipid clinic, she is intolerant to 3 statins.  ? ?3. Stress incontinence, female ?Comments: She agrees to PT referral for eval of pelvic floor dysfunction.  ?- Ambulatory referral to Physical Therapy ? ?4. Primary hypothyroidism ?Comments: I will check a thyroid panel and adjust meds as needed.  ?- TSH + free T4 ? ?5. Class 3 severe obesity due to excess calories with serious comorbidity and body mass index (BMI) of 40.0 to 44.9 in adult [facility]) ?Comments: BMI 42. She is encouraged to aim for at least 150 minutes of exercise/week while striving for BMI<35 to decrease cardiac risk.  ?  ?Patient was given opportunity to ask questions. Patient verbalized understanding of the plan and was able to repeat key elements of the plan. All questions were answered to their satisfaction.  ? ?I, [name], MD, have reviewed all documentation for this visit. The documentation on 06/06/21 for the exam, diagnosis, procedures, and orders are all accurate and complete.  ? ?IF YOU HAVE BEEN REFERRED TO A SPECIALIST, IT MAY TAKE 1-2 WEEKS TO SCHEDULE/PROCESS THE REFERRAL. IF YOU  HAVE NOT HEARD FROM US/SPECIALIST IN TWO WEEKS, PLEASE GIVE [place] A CALL AT [phone] X [phone].  ? ?THE PATIENT IS ENCOURAGED TO PRACTICE SOCIAL DISTANCING DUE TO THE COVID-19 PANDEMIC.   ?

## 2021-06-08 ENCOUNTER — Encounter: Payer: Self-pay | Admitting: Internal Medicine

## 2021-06-08 ENCOUNTER — Other Ambulatory Visit: Payer: Self-pay | Admitting: Internal Medicine

## 2021-06-08 DIAGNOSIS — R748 Abnormal levels of other serum enzymes: Secondary | ICD-10-CM

## 2021-06-08 DIAGNOSIS — M791 Myalgia, unspecified site: Secondary | ICD-10-CM

## 2021-06-08 LAB — SEDIMENTATION RATE: Sed Rate: 34 mm/hr (ref 0–40)

## 2021-06-08 LAB — ANTINUCLEAR ANTIBODIES, IFA: ANA Titer 1: POSITIVE — AB

## 2021-06-08 LAB — TSH+FREE T4
Free T4: 1.18 ng/dL (ref 0.82–1.77)
TSH: 0.876 u[IU]/mL (ref 0.450–4.500)

## 2021-06-08 LAB — FANA STAINING PATTERNS: Speckled Pattern: 1:1280 {titer} — ABNORMAL HIGH

## 2021-06-08 LAB — CK: Total CK: 960 U/L (ref 32–182)

## 2021-06-09 ENCOUNTER — Ambulatory Visit (INDEPENDENT_AMBULATORY_CARE_PROVIDER_SITE_OTHER): Payer: Medicare Other

## 2021-06-09 ENCOUNTER — Telehealth (HOSPITAL_COMMUNITY): Payer: Self-pay | Admitting: *Deleted

## 2021-06-09 ENCOUNTER — Telehealth: Payer: Self-pay | Admitting: Internal Medicine

## 2021-06-09 DIAGNOSIS — I10 Essential (primary) hypertension: Secondary | ICD-10-CM | POA: Diagnosis not present

## 2021-06-09 DIAGNOSIS — R0609 Other forms of dyspnea: Secondary | ICD-10-CM | POA: Diagnosis not present

## 2021-06-09 DIAGNOSIS — R000 Tachycardia, unspecified: Secondary | ICD-10-CM | POA: Diagnosis not present

## 2021-06-09 LAB — ECHOCARDIOGRAM COMPLETE
AR max vel: 2.29 cm2
AV Area VTI: 2.2 cm2
AV Area mean vel: 2 cm2
AV Mean grad: 6 mmHg
AV Peak grad: 10 mmHg
Ao pk vel: 1.58 m/s
Area-P 1/2: 3.46 cm2
Calc EF: 71.8 %
S' Lateral: 1.9 cm
Single Plane A2C EF: 66.4 %
Single Plane A4C EF: 75.1 %

## 2021-06-09 NOTE — Telephone Encounter (Signed)
Reaching out to patient to offer assistance regarding upcoming cardiac imaging study; pt verbalizes understanding of appt date/time, parking situation and where to check in, pre-test NPO status and medications ordered, and verified current allergies; name and call back number provided for further questions should they arise  Trinitee Horgan RN Navigator Cardiac Imaging [facility] [phone] office [phone] cell  Patient to take 100mg metoprolol tartrate two hours prior to her cardiac CT scan. She is aware to arrive at 3:30pm. 

## 2021-06-09 NOTE — Telephone Encounter (Signed)
Tawanda the referral coordinator from [facility] called the office stating the patient had seen Dr. Benjamine Mola in October for a new patient appointment. Dr. Baird Cancer, referring provider, now would like the patient evaluated by Dr. Estanislado Pandy for other reasons. She states they have sent a new referral in Epic. Informed them that it may not be possible for the patient to switch providers.  ?

## 2021-06-09 NOTE — Telephone Encounter (Signed)
Please disregard last message. [facility] called the office again stating Dr. Baird Cancer did not realize the patient was already established with Dr. Benjamine Mola. They request a follow up appointment with Dr. Benjamine Mola due to patients increased symptoms. LMOM for patient to call to schedule follow up with Dr. Benjamine Mola. ?

## 2021-06-10 ENCOUNTER — Ambulatory Visit (HOSPITAL_COMMUNITY)
Admission: RE | Admit: 2021-06-10 | Discharge: 2021-06-10 | Disposition: A | Discharge status: Home / Self Care | Payer: Medicare Other | Source: Ambulatory Visit | Attending: Cardiovascular Disease | Admitting: Cardiovascular Disease

## 2021-06-10 DIAGNOSIS — I10 Essential (primary) hypertension: Secondary | ICD-10-CM

## 2021-06-10 DIAGNOSIS — R9430 Abnormal result of cardiovascular function study, unspecified: Secondary | ICD-10-CM | POA: Diagnosis not present

## 2021-06-10 DIAGNOSIS — R000 Tachycardia, unspecified: Secondary | ICD-10-CM

## 2021-06-10 DIAGNOSIS — I700 Atherosclerosis of aorta: Secondary | ICD-10-CM | POA: Diagnosis not present

## 2021-06-10 DIAGNOSIS — I517 Cardiomegaly: Secondary | ICD-10-CM | POA: Insufficient documentation

## 2021-06-10 DIAGNOSIS — K760 Fatty (change of) liver, not elsewhere classified: Secondary | ICD-10-CM | POA: Diagnosis not present

## 2021-06-10 DIAGNOSIS — R0609 Other forms of dyspnea: Secondary | ICD-10-CM | POA: Diagnosis not present

## 2021-06-10 MED ORDER — NITROGLYCERIN 0.4 MG SL SUBL
SUBLINGUAL_TABLET | SUBLINGUAL | Status: AC
  Filled 2021-06-10: qty 2

## 2021-06-10 MED ORDER — IOHEXOL 350 MG/ML SOLN
100.0000 mL | Freq: Once | INTRAVENOUS | Status: AC | PRN
  Administered 2021-06-10: 100 mL via INTRAVENOUS

## 2021-06-10 MED ORDER — NITROGLYCERIN 0.4 MG SL SUBL
0.8000 mg | SUBLINGUAL_TABLET | Freq: Once | SUBLINGUAL | Status: AC
  Administered 2021-06-10: 0.8 mg via SUBLINGUAL

## 2021-06-10 MED ORDER — METOPROLOL TARTRATE 5 MG/5ML IV SOLN
10.0000 mg | Freq: Once | INTRAVENOUS | Status: AC
Start: 2021-06-10 — End: 2021-06-10
  Administered 2021-06-10: 10 mg via INTRAVENOUS

## 2021-06-10 MED ORDER — METOPROLOL TARTRATE 5 MG/5ML IV SOLN: 10.0000 mg | Freq: Once | INTRAVENOUS | Status: AC

## 2021-06-10 MED ORDER — METOPROLOL TARTRATE 5 MG/5ML IV SOLN
INTRAVENOUS | Status: AC
  Administered 2021-06-10: 10 mg via INTRAVENOUS
  Filled 2021-06-10: qty 10

## 2021-06-10 MED ORDER — METOPROLOL TARTRATE 5 MG/5ML IV SOLN
10.0000 mg | Freq: Once | INTRAVENOUS | Status: AC
Start: 2021-06-10 — End: 2021-06-10

## 2021-06-10 MED ORDER — METOPROLOL TARTRATE 5 MG/5ML IV SOLN
INTRAVENOUS | Status: AC
  Filled 2021-06-10: qty 10

## 2021-06-12 DIAGNOSIS — E782 Mixed hyperlipidemia: Secondary | ICD-10-CM | POA: Diagnosis not present

## 2021-06-12 DIAGNOSIS — E1165 Type 2 diabetes mellitus with hyperglycemia: Secondary | ICD-10-CM | POA: Diagnosis not present

## 2021-06-12 DIAGNOSIS — I10 Essential (primary) hypertension: Secondary | ICD-10-CM | POA: Diagnosis not present

## 2021-06-17 ENCOUNTER — Other Ambulatory Visit: Payer: Self-pay | Admitting: Endocrinology

## 2021-06-17 DIAGNOSIS — E049 Nontoxic goiter, unspecified: Secondary | ICD-10-CM

## 2021-06-20 DIAGNOSIS — G4733 Obstructive sleep apnea (adult) (pediatric): Secondary | ICD-10-CM | POA: Diagnosis not present

## 2021-06-21 ENCOUNTER — Ambulatory Visit: Payer: Medicare Other | Admitting: Internal Medicine

## 2021-06-24 ENCOUNTER — Other Ambulatory Visit: Payer: Self-pay | Admitting: Internal Medicine

## 2021-06-27 DIAGNOSIS — R3915 Urgency of urination: Secondary | ICD-10-CM | POA: Diagnosis not present

## 2021-06-29 DIAGNOSIS — R768 Other specified abnormal immunological findings in serum: Secondary | ICD-10-CM | POA: Diagnosis not present

## 2021-06-29 DIAGNOSIS — M25559 Pain in unspecified hip: Secondary | ICD-10-CM | POA: Diagnosis not present

## 2021-06-29 DIAGNOSIS — M359 Systemic involvement of connective tissue, unspecified: Secondary | ICD-10-CM | POA: Diagnosis not present

## 2021-06-29 DIAGNOSIS — M25551 Pain in right hip: Secondary | ICD-10-CM | POA: Diagnosis not present

## 2021-06-29 DIAGNOSIS — M609 Myositis, unspecified: Secondary | ICD-10-CM | POA: Diagnosis not present

## 2021-06-29 DIAGNOSIS — M25552 Pain in left hip: Secondary | ICD-10-CM | POA: Diagnosis not present

## 2021-07-01 ENCOUNTER — Telehealth: Payer: Self-pay

## 2021-07-01 NOTE — Chronic Care Management (AMB) (Signed)
[employer] patient assistance program notification:  Patient is currently enrolled in auto-refill, a 120- day supply of Ozempic '1mg'$ /ml will be filled on 07/17/2021 and should arrive to the office in 10-14 business days. Patient  enrollment will expire on 01/12/2022.  Pattricia Boss, [place] Pharmacist Assistant [phone]

## 2021-07-07 DIAGNOSIS — R768 Other specified abnormal immunological findings in serum: Secondary | ICD-10-CM | POA: Diagnosis not present

## 2021-07-07 DIAGNOSIS — M609 Myositis, unspecified: Secondary | ICD-10-CM | POA: Diagnosis not present

## 2021-07-07 DIAGNOSIS — M359 Systemic involvement of connective tissue, unspecified: Secondary | ICD-10-CM | POA: Diagnosis not present

## 2021-07-07 DIAGNOSIS — M25559 Pain in unspecified hip: Secondary | ICD-10-CM | POA: Diagnosis not present

## 2021-07-12 ENCOUNTER — Encounter (HOSPITAL_BASED_OUTPATIENT_CLINIC_OR_DEPARTMENT_OTHER): Payer: Self-pay | Admitting: Cardiovascular Disease

## 2021-07-12 ENCOUNTER — Encounter (HOSPITAL_BASED_OUTPATIENT_CLINIC_OR_DEPARTMENT_OTHER): Payer: Self-pay | Admitting: *Deleted

## 2021-07-12 ENCOUNTER — Telehealth (INDEPENDENT_AMBULATORY_CARE_PROVIDER_SITE_OTHER): Payer: Medicare Other | Admitting: Cardiovascular Disease

## 2021-07-12 VITALS — BP 120/62 | HR 81 | Ht 64.0 in | Wt 240.0 lb

## 2021-07-12 DIAGNOSIS — E7800 Pure hypercholesterolemia, unspecified: Secondary | ICD-10-CM

## 2021-07-12 DIAGNOSIS — I318 Other specified diseases of pericardium: Secondary | ICD-10-CM | POA: Diagnosis not present

## 2021-07-12 DIAGNOSIS — Z01812 Encounter for preprocedural laboratory examination: Secondary | ICD-10-CM | POA: Diagnosis not present

## 2021-07-12 DIAGNOSIS — I10 Essential (primary) hypertension: Secondary | ICD-10-CM

## 2021-07-12 DIAGNOSIS — J328 Other chronic sinusitis: Secondary | ICD-10-CM

## 2021-07-12 DIAGNOSIS — Z9989 Dependence on other enabling machines and devices: Secondary | ICD-10-CM | POA: Diagnosis not present

## 2021-07-12 DIAGNOSIS — G4733 Obstructive sleep apnea (adult) (pediatric): Secondary | ICD-10-CM

## 2021-07-12 HISTORY — DX: Other specified diseases of pericardium: I31.8

## 2021-07-12 MED ORDER — SODIUM CHLORIDE 0.9% FLUSH: 3.0000 mL | Freq: Two times a day (BID) | INTRAVENOUS | Status: AC

## 2021-07-12 NOTE — Assessment & Plan Note (Signed)
Elevated CK and muscle aches.  She is in the midst of a rheumatologic work-up.  Her muscle aches did not improve after stopping her statin.  However she had no CAD on her coronary CT.  We do not need to be in any rush to resume her statin.  For now we will await her rheumatologic evaluation and consider resuming pravastatin at low-dose in the future.

## 2021-07-12 NOTE — Assessment & Plan Note (Signed)
Continue CPAP.  

## 2021-07-12 NOTE — H&P (View-Only) (Signed)
Virtual Visit via Video Note   Because of [name] co-morbid illnesses, she is at least at moderate risk for complications without adequate follow up.  This format is felt to be most appropriate for this patient at this time.  All issues noted in this document were discussed and addressed.  A limited physical exam was performed with this format.  Please refer to the patient's chart for her consent to telehealth for [facility].   The patient was identified using 2 identifiers.  Date:  07/12/2021   ID:  Michelle Hicks, DOB 04-21-1950, MRN [id number]  Patient Location: Home Provider Location: Office/Clinic  PCP:  Glendale Chard, MD  Cardiologist:  None  Electrophysiologist:  None   Evaluation Performed:  Follow-Up Visit  Chief Complaint:  shortness of breath  History of Present Illness:    Michelle Hicks is a 71 y.o. female with a hx of hypertension, diabetes mellitus, and depression, here for the evaluation of shortness of breath. She saw Dr. Baird Cancer 03/2021 and noted she was more short of breath with daily activities. She was not getting much exercise at the time. She was referred to cardiology and encouraged to increase her physical activity once cleared.  She reported noticing worsening shortness of breath in the preceding 6 months.  It occurred both with exertion or daily activities. While cleaning around the house she is "laboring for breath."  Blood pressures were labile but elevated as high as the 150s at home.  She was referred for an echocardiogram 05/2021 that revealed LVEF 60 to 65% with mild LVH and moderate hypertrophy of the basal septum.  She had normal diastolic function.  She had a coronary CTA/28/23 that revealed normal coronary.  However there is some thickening and calcification of her pericardium.  There was concern for elevated pulmonary pressures.  The study showed signs of GERD and a small pulmonary nodule.  She has had some issues with muscle stiffness  but did not improve after stopping her statin.  Dr. Baird Cancer read some autoimmune labs and found that she had a positive ANA with a speckled pattern at 1:1280.  ESR was within normal limits.  CK was 960.  She recommended repeat referral to rheumatology which is pending.  She has been able to get much exercise due to muscle discomfort.  She does get a lot of steps in but not any formal exercise.  She continues to have exertional dyspnea.  She denies lower extremity edema, orthopnea, or PND.  She has not had any chest pain or pressure.  She denies any prior radiation to her chest or known episodes of pericarditis.  She does report being told that her heart was thick when she was much younger.  At the last visit amlodipine was added.  Her blood pressures still continue to be somewhat labile.  When she first checks it is sometimes in the 140s.  However after resting it typically is in the 120s over 70s.  She has tolerated amlodipine well.   Past Medical History:  Diagnosis Date   Depression    DM (diabetes mellitus) ([place])    Exertional dyspnea 05/30/2021   HTN (hypertension)    Pericardial calcification 07/12/2021   Tachycardia 05/30/2021    Past Surgical History:  Procedure Laterality Date   ABDOMINAL HYSTERECTOMY     TONSILLECTOMY Bilateral 1967    Current Medications: Current Meds  Medication Sig   amLODipine (NORVASC) 5 MG tablet Take 1 tablet (5 mg total) by mouth  daily.   aspirin EC 81 MG tablet Take 81 mg by mouth daily. Some times   Cholecalciferol (VITAMIN D-3 PO) Take 2,000 Units by mouth daily.    hydrochlorothiazide (HYDRODIURIL) 25 MG tablet Take 1 tablet (25 mg total) by mouth daily.   hydroxychloroquine (PLAQUENIL) 200 MG tablet Take 200 mg by mouth daily.   Lancets Misc. (ONE TOUCH SURESOFT) MISC Use as directed to check blood sugars 2 times per day dx e11.65   levothyroxine (SYNTHROID) 50 MCG tablet Take 50 mcg by mouth daily.    meloxicam (MOBIC) 15 MG tablet TAKE 1 TABLET BY  MOUTH  DAILY AS NEEDED   Multiple Vitamin (MULTIVITAMIN) tablet Take 1 tablet by mouth daily.   omeprazole (PRILOSEC) 40 MG capsule TAKE 1 CAPSULE BY MOUTH  DAILY BEFORE A MEAL (Patient taking differently: Take 40 mg by mouth as needed.)   ONETOUCH ULTRA test strip USE AS DIRECTED TO CHECK  BLOOD SUGAR TWICE DAILY   predniSONE (DELTASONE) 10 MG tablet Take 20 mg by mouth daily.   Semaglutide, 1 MG/DOSE, (OZEMPIC, 1 MG/DOSE,) 2 MG/1.5ML SOPN Inject 1 mg into the skin once a week.   telmisartan (MICARDIS) 80 MG tablet TAKE 1 TABLET BY MOUTH  DAILY   traZODone (DESYREL) 50 MG tablet Take 1 tablet (50 mg total) by mouth at bedtime as needed for sleep.   [DISCONTINUED] metoprolol tartrate (LOPRESSOR) 100 MG tablet TAKE 1 TABLET 2 HOURS PRIOR TO CT   Current Facility-Administered Medications for the 07/12/21 encounter (Video Visit) with Skeet Latch, MD  Medication   sodium chloride flush (NS) 0.9 % injection 3 mL     Allergies:   Sulfa antibiotics, Sulfamethoxazole-trimethoprim, and Other   Social History   Socioeconomic History   Marital status: Married    Spouse name: Not on file   Number of children: Not on file   Years of education: Not on file   Highest education level: Not on file  Occupational History   Occupation: retired  Tobacco Use   Smoking status: Former    Packs/day: 0.50    Years: 15.00    Pack years: 7.50    Types: Cigarettes    Quit date: 2002    Years since quitting: 21.4    Passive exposure: Past   Smokeless tobacco: Never  Vaping Use   Vaping Use: Never used  Substance and Sexual Activity   Alcohol use: Yes    Comment: socially    Drug use: No   Sexual activity: Not Currently    Partners: Male    Birth control/protection: Surgical  Other Topics Concern   Not on file  Social History Narrative   Not on file   Social Determinants of Health   Financial Resource Strain: High Risk   Difficulty of Paying Living Expenses: Very hard  Food Insecurity:  No Food Insecurity   Worried About Charity fundraiser in the Last Year: Never true   [place] in the Last Year: Never true  Transportation Needs: No Transportation Needs   Lack of Transportation (Medical): No   Lack of Transportation (Non-Medical): No  Physical Activity: Inactive   Days of Exercise per Week: 0 days   Minutes of Exercise per Session: 0 min  Stress: No Stress Concern Present   Feeling of Stress : Only a little  Social Connections: Not on file     Family History: The patient's family history includes Diabetes in her mother; Heart attack (age of onset: 82) in  her father; Hypertension in her father; Pancreatic cancer in her mother.  ROS:   Please see the history of present illness.    (+) Shortness of breath (+) Acid reflux All other systems reviewed and are negative.  EKGs/Labs/Other Studies Reviewed:    The following studies were reviewed today:  Left LE Venous Doppler 07/30/2020: Summary:   LEFT:  - There is no evidence of deep vein thrombosis in the lower extremity.  - There is no evidence of superficial venous thrombosis.     - No cystic structure found in the popliteal fossa.  Echo 06/09/21: IMPRESSIONS     1. Left ventricular ejection fraction, by estimation, is 60 to 65%. Left  ventricular ejection fraction by 3D volume is 62 %. The left ventricle has  normal function. The left ventricle has no regional wall motion  abnormalities. There is mild concentric  left ventricular hypertrophy and moderate basal septal hypertrophy.   2. Right ventricular systolic function is normal. The right ventricular  size is mildly enlarged. Tricuspid regurgitation signal is inadequate for  assessing PA pressure.   3. Right atrial size was mildly dilated.   4. The mitral valve is normal in structure. No evidence of mitral valve  regurgitation. No evidence of mitral stenosis.   5. The aortic valve is normal in structure. There is moderate  calcification of the  aortic valve. Aortic valve regurgitation is not  visualized. Aortic valve sclerosis/calcification is present, without any  evidence of aortic stenosis. Aortic valve area,  by VTI measures 2.20 cm. Aortic valve mean gradient measures 6.0 mmHg.  Aortic valve Vmax measures 1.58 m/s.   6. The inferior vena cava is normal in size with greater than 50%  respiratory variability, suggesting right atrial pressure of 3 mmHg.   Coronary CTA/29/23: IMPRESSION: 1. Tortuous coronary arteries with no evidence of CAD, CADRADS = 0. Image quality degraded by reduced signal to noise ratio, no definite proximal or mid plaque or coronary calcifications seen.   2. Coronary calcium score of 0.   3. Normal coronary origins with left dominance.   4. Scattered pericardial calcifications seen circumferentially. For indication of dyspnea on exertion, consider evaluation for constrictive pericarditis.   5. Mild dilation of main pulmonary artery, 31 mm, may suggest increased pulmonary pressure.    EKG:   EKG is personally reviewed. 05/30/2021: Sinus rhythm. Rate 98 bpm.  Recent Labs: 03/31/2021: ALT 33; Hemoglobin 11.2; Platelets 283 06/03/2021: BUN 11; Creatinine, Ser 0.81; Potassium 4.1; Sodium 137 06/06/2021: TSH 0.876   Recent Lipid Panel    Component Value Date/Time   CHOL 159 03/31/2021 1055   TRIG 96 03/31/2021 1055   HDL 63 03/31/2021 1055   CHOLHDL 2.5 03/31/2021 1055   LDLCALC 78 03/31/2021 1055    Physical Exam:    BP 120/62 Comment: Taken two minutes after the first reading.  Pulse 81   Ht _0  (1.626 m)   Wt 240 lb (108.9 kg)   BMI 41.20 kg/m  GENERAL: Well-appearing.  No acute distress. HEENT: Pupils equal round.  Oral mucosa unremarkable NECK:  No jugular venous distention, no visible thyromegaly EXT:  No edema, no cyanosis no clubbing SKIN:  No rashes no nodules NEURO:  Speech fluent.  Cranial nerves grossly intact.  Moves all 4 extremities freely PSYCH:  Cognitively intact,  oriented to person place and time  ASSESSMENT:    1. Pre-procedure lab exam   2. Essential hypertension, benign   3. OSA on CPAP   4.  Pericardial calcification   5. Pure hypercholesterolemia     PLAN:    Essential hypertension, benign Blood pressure is better controlled.  Continue amlodipine, HCTZ, and telmisartan.  We discussed trying to do water therapy as this may be easier on her muscles and joints.  Continue Ozempic.  OSA on CPAP Continue CPAP.  Pericardial calcification Noted on coronary CTA.  It was also indicated that she has elevated pulmonary pressures on her CT scan.  I reviewed her echocardiogram.  She does have a a septal bounce consistent with constriction.  Right ventricular function is normal and the TR signal is insufficient for assessing right-sided pressures.  Right atrial pressure was 3. There is no significant pericardial effusion and medial e' velocity is less than the lateral e' velocity.  Based on these findings and her normally collapsing IVC, I suspect that constriction is not causing her dyspnea.  She is in the midst of a rheumatologic work up and is already on prednisone.  We will get LHC/RHC to make sure there is no evidence of pressure equalization.  Will ask her to hydrate the day prior to the study.  Shared Decision Making/Informed Consent  The risks [stroke (1 in 1000), death (1 in 1000), bleeding (1 in 200), allergic reaction [possibly serious] (1 in 200)], benefits (diagnostic support and management of coronary artery disease) and alternatives of a cardiac catheterization were discussed in detail with Ms. Cathren Harsh and she is willing to proceed.       Pure hypercholesterolemia Elevated CK and muscle aches.  She is in the midst of a rheumatologic work-up.  Her muscle aches did not improve after stopping her statin.  However she had no CAD on her coronary CT.  We do not need to be in any rush to resume her statin.  For now we will await her  rheumatologic evaluation and consider resuming pravastatin at low-dose in the future.    Disposition: FU with Chuong Casebeer C. Oval Linsey, MD, [facility] in 2 months.  Medication Adjustments/Labs and Tests Ordered: Current medicines are reviewed at length with the patient today.  Concerns regarding medicines are outlined above.   Orders Placed This Encounter  Procedures   CBC with Differential/Platelet   Basic metabolic panel   Meds ordered this encounter  Medications   sodium chloride flush (NS) 0.9 % injection 3 mL   Patient Instructions  Medication Instructions:  Your physician recommends that you continue on your current medications as directed. Please refer to the Current Medication list given to you today.   Labwork: BMET/CBC FEW DAYS PRIOR TO CATH   Testing/Procedures: Your physician has requested that you have a cardiac catheterization. Cardiac catheterization is used to diagnose and/or treat various heart conditions. Doctors may recommend this procedure for a number of different reasons. The most common reason is to evaluate chest pain. Chest pain can be a symptom of coronary artery disease (CAD), and cardiac catheterization can show whether plaque is narrowing or blocking your heart's arteries. This procedure is also used to evaluate the valves, as well as measure the blood flow and oxygen levels in different parts of your heart. For further information please visit Huge[facility]iesta[employer]tn. Please follow instruction sheet, as given.  Follow-Up: 07/27/2021 10:20 am MYCHART VISIT WITH DR [facility]   Any Other Special Instructions Will Be Listed Below (If Applicable).  DRINK  2.5 LITERS OF FLUID DAY BEFORE CATH   [place] [facility] CARDIOLOGY [place] [address] Dept: [place]  M Scholler  07/12/2021  You are scheduled for a Cardiac Catheterization on Friday, June 9 with Dr. Sherren Mocha.  1.  Please arrive at the Main Entrance A at [facility]: [place],  [id number] at 7:00 AM (This time is two hours before your procedure to ensure your preparation). Free valet parking service is available.   Special note: Every effort is made to have your procedure done on time. Please understand that emergencies sometimes delay scheduled procedures.  2. Diet: Do not eat solid foods after midnight.  You may have clear liquids until 5 AM upon the day of the procedure.  3. Labs: You will need to have blood drawn FEW DAYS PRIOR TO CATH You do not need to be fasting. YOU CAN GO TO [facility] [address]  Monday-Friday 8:00-4:30 WITH LUNCH 12-1. OR ANY [facility]   4. Medication instructions in preparation for your procedure:   Contrast Allergy: No  On the morning of your procedure, take Aspirin and any morning medicines NOT listed above.  You may use sips of water.  5. Plan to go home the same day, you will only stay overnight if medically necessary. 6. You MUST have a responsible adult to drive you home. 7. An adult MUST be with you the first 24 hours after you arrive home. 8. Bring a current list of your medications, and the last time and date medication taken. 9. Bring ID and current insurance cards. 10.Please wear clothes that are easy to get on and off and wear slip-on shoes.  Thank you for allowing [place] to care for you!   -- [facility]     COVID-19 Education: The signs and symptoms of COVID-19 were discussed with the patient and how to seek care for testing (follow up with PCP or arrange E-visit).  The importance of social distancing was discussed today.  Time:   Today, I have spent 45 minutes with the patient with telehealth technology discussing the above problems.    Signed, Skeet Latch, MD  07/12/2021 11:48 AM    [place]

## 2021-07-12 NOTE — Patient Instructions (Signed)
Medication Instructions:  Your physician recommends that you continue on your current medications as directed. Please refer to the Current Medication list given to you today.   Labwork: BMET/CBC FEW DAYS PRIOR TO CATH   Testing/Procedures: Your physician has requested that you have a cardiac catheterization. Cardiac catheterization is used to diagnose and/or treat various heart conditions. Doctors may recommend this procedure for a number of different reasons. The most common reason is to evaluate chest pain. Chest pain can be a symptom of coronary artery disease (CAD), and cardiac catheterization can show whether plaque is narrowing or blocking your heart's arteries. This procedure is also used to evaluate the valves, as well as measure the blood flow and oxygen levels in different parts of your heart. For further information please visit HugeFiesta[facility]tn. Please follow instruction sheet, as given.  Follow-Up: 07/27/2021 10:20 am MYCHART VISIT WITH DR [facility]   Any Other Special Instructions Will Be Listed Below (If Applicable).  DRINK  2.5 LITERS OF FLUID DAY BEFORE CATH   [facility] CARDIOLOGY [place] [address] Dept: [place]  07/12/2021  You are scheduled for a Cardiac Catheterization on Friday, June 9 with Dr. Sherren Mocha.  1. Please arrive at the Main Entrance A at [facility]: [place], [place] [address] at 7:00 AM (This time is two hours before your procedure to ensure your preparation). Free valet parking service is available.   Special note: Every effort is made to have your procedure done on time. Please understand that emergencies sometimes delay scheduled procedures.  2. Diet: Do not eat solid foods after midnight.  You may have clear liquids until 5 AM upon the day of the procedure.  3. Labs: You will need to have blood drawn FEW DAYS  PRIOR TO CATH You do not need to be fasting. YOU CAN GO TO [facility] [address]  Monday-Friday 8:00-4:30 WITH LUNCH 12-1. OR ANY [facility]   4. Medication instructions in preparation for your procedure:   Contrast Allergy: No  On the morning of your procedure, take Aspirin and any morning medicines NOT listed above.  You may use sips of water.  5. Plan to go home the same day, you will only stay overnight if medically necessary. 6. You MUST have a responsible adult to drive you home. 7. An adult MUST be with you the first 24 hours after you arrive home. 8. Bring a current list of your medications, and the last time and date medication taken. 9. Bring ID and current insurance cards. 10.Please wear clothes that are easy to get on and off and wear slip-on shoes.  Thank you for allowing [place] to care for you!   --  Invasive Cardiovascular services

## 2021-07-12 NOTE — Addendum Note (Signed)
Addended by: Alvina Filbert B on: 07/12/2021 12:28 PM   Modules accepted: Orders

## 2021-07-12 NOTE — Progress Notes (Signed)
Virtual Visit via Video Note   Because of Michelle Hicks co-morbid illnesses, she is at least at moderate risk for complications without adequate follow up.  This format is felt to be most appropriate for this patient at this time.  All issues noted in this document were discussed and addressed.  A limited physical exam was performed with this format.  Please refer to the patient's chart for her consent to telehealth for [facility].   The patient was identified using 2 identifiers.  Date:  07/12/2021   ID:  Michelle Hicks, DOB 04-21-1950, MRN [id number]  Patient Location: Home Provider Location: Office/Clinic  PCP:  Glendale Chard, MD  Cardiologist:  None  Electrophysiologist:  None   Evaluation Performed:  Follow-Up Visit  Chief Complaint:  shortness of breath  History of Present Illness:    Michelle Hicks is a 71 y.o. female with a hx of hypertension, diabetes mellitus, and depression, here for the evaluation of shortness of breath. She saw Dr. Baird Cancer 03/2021 and noted she was more short of breath with daily activities. She was not getting much exercise at the time. She was referred to cardiology and encouraged to increase her physical activity once cleared.  She reported noticing worsening shortness of breath in the preceding 6 months.  It occurred both with exertion or daily activities. While cleaning around the house she is "laboring for breath."  Blood pressures were labile but elevated as high as the 150s at home.  She was referred for an echocardiogram 05/2021 that revealed LVEF 60 to 65% with mild LVH and moderate hypertrophy of the basal septum.  She had normal diastolic function.  She had a coronary CTA/28/23 that revealed normal coronary.  However there is some thickening and calcification of her pericardium.  There was concern for elevated pulmonary pressures.  The study showed signs of GERD and a small pulmonary nodule.  She has had some issues with muscle stiffness  but did not improve after stopping her statin.  Dr. Baird Cancer read some autoimmune labs and found that she had a positive ANA with a speckled pattern at 1:1280.  ESR was within normal limits.  CK was 960.  She recommended repeat referral to rheumatology which is pending.  She has been able to get much exercise due to muscle discomfort.  She does get a lot of steps in but not any formal exercise.  She continues to have exertional dyspnea.  She denies lower extremity edema, orthopnea, or PND.  She has not had any chest pain or pressure.  She denies any prior radiation to her chest or known episodes of pericarditis.  She does report being told that her heart was thick when she was much younger.  At the last visit amlodipine was added.  Her blood pressures still continue to be somewhat labile.  When she first checks it is sometimes in the 140s.  However after resting it typically is in the 120s over 70s.  She has tolerated amlodipine well.   Past Medical History:  Diagnosis Date   Depression    DM (diabetes mellitus) ([place])    Exertional dyspnea 05/30/2021   HTN (hypertension)    Pericardial calcification 07/12/2021   Tachycardia 05/30/2021    Past Surgical History:  Procedure Laterality Date   ABDOMINAL HYSTERECTOMY     TONSILLECTOMY Bilateral 1967    Current Medications: Current Meds  Medication Sig   amLODipine (NORVASC) 5 MG tablet Take 1 tablet (5 mg total) by mouth  daily.   aspirin EC 81 MG tablet Take 81 mg by mouth daily. Some times   Cholecalciferol (VITAMIN D-3 PO) Take 2,000 Units by mouth daily.    hydrochlorothiazide (HYDRODIURIL) 25 MG tablet Take 1 tablet (25 mg total) by mouth daily.   hydroxychloroquine (PLAQUENIL) 200 MG tablet Take 200 mg by mouth daily.   Lancets Misc. (ONE TOUCH SURESOFT) MISC Use as directed to check blood sugars 2 times per day dx e11.65   levothyroxine (SYNTHROID) 50 MCG tablet Take 50 mcg by mouth daily.    meloxicam (MOBIC) 15 MG tablet TAKE 1 TABLET BY  MOUTH  DAILY AS NEEDED   Multiple Vitamin (MULTIVITAMIN) tablet Take 1 tablet by mouth daily.   omeprazole (PRILOSEC) 40 MG capsule TAKE 1 CAPSULE BY MOUTH  DAILY BEFORE A MEAL (Patient taking differently: Take 40 mg by mouth as needed.)   ONETOUCH ULTRA test strip USE AS DIRECTED TO CHECK  BLOOD SUGAR TWICE DAILY   predniSONE (DELTASONE) 10 MG tablet Take 20 mg by mouth daily.   Semaglutide, 1 MG/DOSE, (OZEMPIC, 1 MG/DOSE,) 2 MG/1.5ML SOPN Inject 1 mg into the skin once a week.   telmisartan (MICARDIS) 80 MG tablet TAKE 1 TABLET BY MOUTH  DAILY   traZODone (DESYREL) 50 MG tablet Take 1 tablet (50 mg total) by mouth at bedtime as needed for sleep.   [DISCONTINUED] metoprolol tartrate (LOPRESSOR) 100 MG tablet TAKE 1 TABLET 2 HOURS PRIOR TO CT   Current Facility-Administered Medications for the 07/12/21 encounter (Video Visit) with Skeet Latch, MD  Medication   sodium chloride flush (NS) 0.9 % injection 3 mL     Allergies:   Sulfa antibiotics, Sulfamethoxazole-trimethoprim, and Other   Social History   Socioeconomic History   Marital status: Married    Spouse name: Not on file   Number of children: Not on file   Years of education: Not on file   Highest education level: Not on file  Occupational History   Occupation: retired  Tobacco Use   Smoking status: Former    Packs/day: 0.50    Years: 15.00    Pack years: 7.50    Types: Cigarettes    Quit date: 2002    Years since quitting: 21.4    Passive exposure: Past   Smokeless tobacco: Never  Vaping Use   Vaping Use: Never used  Substance and Sexual Activity   Alcohol use: Yes    Comment: socially    Drug use: No   Sexual activity: Not Currently    Partners: Male    Birth control/protection: Surgical  Other Topics Concern   Not on file  Social History Narrative   Not on file   Social Determinants of Health   Financial Resource Strain: High Risk   Difficulty of Paying Living Expenses: Very hard  Food Insecurity:  No Food Insecurity   Worried About Charity fundraiser in the Last Year: Never true   [place] in the Last Year: Never true  Transportation Needs: No Transportation Needs   Lack of Transportation (Medical): No   Lack of Transportation (Non-Medical): No  Physical Activity: Inactive   Days of Exercise per Week: 0 days   Minutes of Exercise per Session: 0 min  Stress: No Stress Concern Present   Feeling of Stress : Only a little  Social Connections: Not on file     Family History: The patient's family history includes Diabetes in her mother; Heart attack (age of onset: 82) in  her father; Hypertension in her father; Pancreatic cancer in her mother.  ROS:   Please see the history of present illness.    (+) Shortness of breath (+) Acid reflux All other systems reviewed and are negative.  EKGs/Labs/Other Studies Reviewed:    The following studies were reviewed today:  Left LE Venous Doppler 07/30/2020: Summary:   LEFT:  - There is no evidence of deep vein thrombosis in the lower extremity.  - There is no evidence of superficial venous thrombosis.     - No cystic structure found in the popliteal fossa.  Echo 06/09/21: IMPRESSIONS     1. Left ventricular ejection fraction, by estimation, is 60 to 65%. Left  ventricular ejection fraction by 3D volume is 62 %. The left ventricle has  normal function. The left ventricle has no regional wall motion  abnormalities. There is mild concentric  left ventricular hypertrophy and moderate basal septal hypertrophy.   2. Right ventricular systolic function is normal. The right ventricular  size is mildly enlarged. Tricuspid regurgitation signal is inadequate for  assessing PA pressure.   3. Right atrial size was mildly dilated.   4. The mitral valve is normal in structure. No evidence of mitral valve  regurgitation. No evidence of mitral stenosis.   5. The aortic valve is normal in structure. There is moderate  calcification of the  aortic valve. Aortic valve regurgitation is not  visualized. Aortic valve sclerosis/calcification is present, without any  evidence of aortic stenosis. Aortic valve area,  by VTI measures 2.20 cm. Aortic valve mean gradient measures 6.0 mmHg.  Aortic valve Vmax measures 1.58 m/s.   6. The inferior vena cava is normal in size with greater than 50%  respiratory variability, suggesting right atrial pressure of 3 mmHg.   Coronary CTA/29/23: IMPRESSION: 1. Tortuous coronary arteries with no evidence of CAD, CADRADS = 0. Image quality degraded by reduced signal to noise ratio, no definite proximal or mid plaque or coronary calcifications seen.   2. Coronary calcium score of 0.   3. Normal coronary origins with left dominance.   4. Scattered pericardial calcifications seen circumferentially. For indication of dyspnea on exertion, consider evaluation for constrictive pericarditis.   5. Mild dilation of main pulmonary artery, 31 mm, may suggest increased pulmonary pressure.    EKG:   EKG is personally reviewed. 05/30/2021: Sinus rhythm. Rate 98 bpm.  Recent Labs: 03/31/2021: ALT 33; Hemoglobin 11.2; Platelets 283 06/03/2021: BUN 11; Creatinine, Ser 0.81; Potassium 4.1; Sodium 137 06/06/2021: TSH 0.876   Recent Lipid Panel    Component Value Date/Time   CHOL 159 03/31/2021 1055   TRIG 96 03/31/2021 1055   HDL 63 03/31/2021 1055   CHOLHDL 2.5 03/31/2021 1055   LDLCALC 78 03/31/2021 1055    Physical Exam:    BP 120/62 Comment: Taken two minutes after the first reading.  Pulse 81   Ht _0  (1.626 m)   Wt 240 lb (108.9 kg)   BMI 41.20 kg/m  GENERAL: Well-appearing.  No acute distress. HEENT: Pupils equal round.  Oral mucosa unremarkable NECK:  No jugular venous distention, no visible thyromegaly EXT:  No edema, no cyanosis no clubbing SKIN:  No rashes no nodules NEURO:  Speech fluent.  Cranial nerves grossly intact.  Moves all 4 extremities freely PSYCH:  Cognitively intact,  oriented to person place and time  ASSESSMENT:    1. Pre-procedure lab exam   2. Essential hypertension, benign   3. OSA on CPAP   4.  Pericardial calcification   5. Pure hypercholesterolemia     PLAN:    Essential hypertension, benign Blood pressure is better controlled.  Continue amlodipine, HCTZ, and telmisartan.  We discussed trying to do water therapy as this may be easier on her muscles and joints.  Continue Ozempic.  OSA on CPAP Continue CPAP.  Pericardial calcification Noted on coronary CTA.  It was also indicated that she has elevated pulmonary pressures on her CT scan.  I reviewed her echocardiogram.  She does have a a septal bounce consistent with constriction.  Right ventricular function is normal and the TR signal is insufficient for assessing right-sided pressures.  Right atrial pressure was 3. There is no significant pericardial effusion and medial e' velocity is less than the lateral e' velocity.  Based on these findings and her normally collapsing IVC, I suspect that constriction is not causing her dyspnea.  She is in the midst of a rheumatologic work up and is already on prednisone.  We will get LHC/RHC to make sure there is no evidence of pressure equalization.  Will ask her to hydrate the day prior to the study.  Shared Decision Making/Informed Consent  The risks [stroke (1 in 1000), death (1 in 1000), bleeding (1 in 200), allergic reaction [possibly serious] (1 in 200)], benefits (diagnostic support and management of coronary artery disease) and alternatives of a cardiac catheterization were discussed in detail with Michelle Hicks and she is willing to proceed.       Pure hypercholesterolemia Elevated CK and muscle aches.  She is in the midst of a rheumatologic work-up.  Her muscle aches did not improve after stopping her statin.  However she had no CAD on her coronary CT.  We do not need to be in any rush to resume her statin.  For now we will await her  rheumatologic evaluation and consider resuming pravastatin at low-dose in the future.    Disposition: FU with Arika Mainer C. Oval Linsey, MD, [facility] in 2 months.  Medication Adjustments/Labs and Tests Ordered: Current medicines are reviewed at length with the patient today.  Concerns regarding medicines are outlined above.   Orders Placed This Encounter  Procedures   CBC with Differential/Platelet   Basic metabolic panel   Meds ordered this encounter  Medications   sodium chloride flush (NS) 0.9 % injection 3 mL   Patient Instructions  Medication Instructions:  Your physician recommends that you continue on your current medications as directed. Please refer to the Current Medication list given to you today.   Labwork: BMET/CBC FEW DAYS PRIOR TO CATH   Testing/Procedures: Your physician has requested that you have a cardiac catheterization. Cardiac catheterization is used to diagnose and/or treat various heart conditions. Doctors may recommend this procedure for a number of different reasons. The most common reason is to evaluate chest pain. Chest pain can be a symptom of coronary artery disease (CAD), and cardiac catheterization can show whether plaque is narrowing or blocking your heart's arteries. This procedure is also used to evaluate the valves, as well as measure the blood flow and oxygen levels in different parts of your heart. For further information please visit [email or web address]. Please follow instruction sheet, as given.  Follow-Up: 07/27/2021 10:20 am MYCHART VISIT WITH DR [facility]   Any Other Special Instructions Will Be Listed Below (If Applicable).  DRINK  2.5 LITERS OF FLUID DAY BEFORE CATH   [facility] [place] CARDIOLOGY [place] [address] Dept: [place]  M Kobayashi  07/12/2021  You are scheduled for a Cardiac Catheterization on Friday, June 9 with Dr. Sherren Mocha.  1.  Please arrive at the Main Entrance A at [facility]: [place], [place] [address] at 7:00 AM (This time is two hours before your procedure to ensure your preparation). Free valet parking service is available.   Special note: Every effort is made to have your procedure done on time. Please understand that emergencies sometimes delay scheduled procedures.  2. Diet: Do not eat solid foods after midnight.  You may have clear liquids until 5 AM upon the day of the procedure.  3. Labs: You will need to have blood drawn FEW DAYS PRIOR TO CATH You do not need to be fasting. YOU CAN GO TO [facility] [address]  Monday-Friday 8:00-4:30 WITH LUNCH 12-1. OR ANY [facility]   4. Medication instructions in preparation for your procedure:   Contrast Allergy: No  On the morning of your procedure, take Aspirin and any morning medicines NOT listed above.  You may use sips of water.  5. Plan to go home the same day, you will only stay overnight if medically necessary. 6. You MUST have a responsible adult to drive you home. 7. An adult MUST be with you the first 24 hours after you arrive home. 8. Bring a current list of your medications, and the last time and date medication taken. 9. Bring ID and current insurance cards. 10.Please wear clothes that are easy to get on and off and wear slip-on shoes.  Thank you for allowing [place] to care for you!   -- [facility]     COVID-19 Education: The signs and symptoms of COVID-19 were discussed with the patient and how to seek care for testing (follow up with PCP or arrange E-visit).  The importance of social distancing was discussed today.  Time:   Today, I have spent 45 minutes with the patient with telehealth technology discussing the above problems.    Signed, Skeet Latch, MD  07/12/2021 11:48 AM    [place]

## 2021-07-12 NOTE — Assessment & Plan Note (Addendum)
Noted on coronary CTA.  It was also indicated that she has elevated pulmonary pressures on her CT scan.  I reviewed her echocardiogram.  She does have a a septal bounce consistent with constriction.  Right ventricular function is normal and the TR signal is insufficient for assessing right-sided pressures.  Right atrial pressure was 3. There is no significant pericardial effusion and medial e' velocity is less than the lateral e' velocity.  Based on these findings and her normally collapsing IVC, I suspect that constriction is not causing her dyspnea.  She is in the midst of a rheumatologic work up and is already on prednisone.  We will get LHC/RHC to make sure there is no evidence of pressure equalization.  Will ask her to hydrate the day prior to the study.  Shared Decision Making/Informed Consent  The risks [stroke (1 in 1000), death (1 in 1000), bleeding (1 in 200), allergic reaction [possibly serious] (1 in 200)], benefits (diagnostic support and management of coronary artery disease) and alternatives of a cardiac catheterization were discussed in detail with Michelle Hicks and she is willing to proceed.

## 2021-07-12 NOTE — Assessment & Plan Note (Signed)
Blood pressure is better controlled.  Continue amlodipine, HCTZ, and telmisartan.  We discussed trying to do water therapy as this may be easier on her muscles and joints.  Continue Ozempic.

## 2021-07-13 ENCOUNTER — Encounter: Payer: Self-pay | Admitting: Internal Medicine

## 2021-07-13 ENCOUNTER — Ambulatory Visit (INDEPENDENT_AMBULATORY_CARE_PROVIDER_SITE_OTHER): Payer: Medicare Other | Admitting: Internal Medicine

## 2021-07-13 VITALS — BP 122/70 | HR 85 | Temp 98.2°F | Ht 64.0 in | Wt 240.2 lb

## 2021-07-13 DIAGNOSIS — E1165 Type 2 diabetes mellitus with hyperglycemia: Secondary | ICD-10-CM

## 2021-07-13 DIAGNOSIS — R911 Solitary pulmonary nodule: Secondary | ICD-10-CM

## 2021-07-13 DIAGNOSIS — M609 Myositis, unspecified: Secondary | ICD-10-CM | POA: Diagnosis not present

## 2021-07-13 DIAGNOSIS — Z0000 Encounter for general adult medical examination without abnormal findings: Secondary | ICD-10-CM

## 2021-07-13 DIAGNOSIS — Z6841 Body Mass Index (BMI) 40.0 and over, adult: Secondary | ICD-10-CM

## 2021-07-13 DIAGNOSIS — E782 Mixed hyperlipidemia: Secondary | ICD-10-CM

## 2021-07-13 DIAGNOSIS — E1169 Type 2 diabetes mellitus with other specified complication: Secondary | ICD-10-CM

## 2021-07-13 DIAGNOSIS — I10 Essential (primary) hypertension: Secondary | ICD-10-CM | POA: Diagnosis not present

## 2021-07-13 DIAGNOSIS — E041 Nontoxic single thyroid nodule: Secondary | ICD-10-CM

## 2021-07-13 DIAGNOSIS — E785 Hyperlipidemia, unspecified: Secondary | ICD-10-CM | POA: Diagnosis not present

## 2021-07-13 DIAGNOSIS — E66813 Obesity, class 3: Secondary | ICD-10-CM

## 2021-07-13 NOTE — Progress Notes (Signed)
Rich Brave [place]leton,acting as a Education administrator for [name], MD.,have documented all relevant documentation on the behalf of [name], MD,as directed by  [name], MD while in the presence of [name], MD.  This visit occurred during the SARS-CoV-2 public health emergency.  Safety protocols were in place, including screening questions prior to the visit, additional usage of staff PPE, and extensive cleaning of exam room while observing appropriate contact time as indicated for disinfecting solutions.  Subjective:     Patient ID: Michelle Hicks , female    DOB: [birth year] , 71 y.o.   MRN: [id number]   Chief Complaint  Patient presents with   Annual Exam   Diabetes   Hypertension    HPI  She is here today for a full physical examination. She is followed by [facility] for her GYN exams. She reports compliance with meds. She has been evaluated by Rheum for elevated CPK levels. She states she has been diagnosed with myositis. She has been on prednisone w/ some improvement of her sx.   Diabetes She presents for her follow-up diabetic visit. She has type 2 diabetes mellitus. Her disease course has been stable. There are no hypoglycemic associated symptoms. Pertinent negatives for diabetes include no blurred vision. There are no hypoglycemic complications. Symptoms are stable. Risk factors for coronary artery disease include diabetes mellitus, dyslipidemia, hypertension, post-menopausal, sedentary lifestyle and obesity. She is compliant with treatment most of the time. She is following a generally healthy diet. She participates in exercise intermittently. An ACE inhibitor/angiotensin II receptor blocker is being taken.  Hypertension This is a chronic problem. The current episode started more than 1 year ago. The problem has been gradually improving since onset. The problem is controlled. Pertinent negatives include no blurred vision. The current treatment provides moderate  improvement.    Past Medical History:  Diagnosis Date   Depression    DM (diabetes mellitus) ([place])    Exertional dyspnea 05/30/2021   HTN (hypertension)    Pericardial calcification 07/12/2021   Tachycardia 05/30/2021     Family History  Problem Relation Age of Onset   Diabetes Mother    Pancreatic cancer Mother    Hypertension Father    Heart attack Father 62     Current Outpatient Medications:    amLODipine (NORVASC) 5 MG tablet, Take 1 tablet (5 mg total) by mouth daily., Disp: 90 tablet, Rfl: 3   aspirin EC 81 MG tablet, Take 81 mg by mouth daily. Some times, Disp: , Rfl:    Cholecalciferol (VITAMIN D-3 PO), Take 2,000 Units by mouth daily. , Disp: , Rfl:    hydrochlorothiazide (HYDRODIURIL) 25 MG tablet, Take 1 tablet (25 mg total) by mouth daily., Disp: 90 tablet, Rfl: 3   hydroxychloroquine (PLAQUENIL) 200 MG tablet, Take 200 mg by mouth daily., Disp: , Rfl:    Lancets Misc. (ONE TOUCH SURESOFT) MISC, Use as directed to check blood sugars 2 times per day dx e11.65, Disp: 300 each, Rfl: 2   levothyroxine (SYNTHROID) 50 MCG tablet, Take 50 mcg by mouth daily. , Disp: , Rfl:    Multiple Vitamin (MULTIVITAMIN) tablet, Take 1 tablet by mouth daily., Disp: , Rfl:    omeprazole (PRILOSEC) 40 MG capsule, TAKE 1 CAPSULE BY MOUTH  DAILY BEFORE A MEAL (Patient taking differently: Take 40 mg by mouth daily.), Disp: 90 capsule, Rfl: 2   ONETOUCH ULTRA test strip, USE AS DIRECTED TO CHECK  BLOOD SUGAR TWICE DAILY, Disp: 200 strip,  Rfl: 3   predniSONE (DELTASONE) 10 MG tablet, Take 10 mg by mouth 2 (two) times daily with a meal. Taper, Disp: , Rfl:    Semaglutide, 1 MG/DOSE, (OZEMPIC, 1 MG/DOSE,) 2 MG/1.5ML SOPN, Inject 1 mg into the skin once a week., Disp: 4.5 mL, Rfl: 3   telmisartan (MICARDIS) 80 MG tablet, TAKE 1 TABLET BY MOUTH  DAILY, Disp: 90 tablet, Rfl: 3   traZODone (DESYREL) 50 MG tablet, Take 1 tablet (50 mg total) by mouth at bedtime as needed for sleep., Disp: 90 tablet, Rfl:  3   fluticasone (FLONASE) 50 MCG/ACT nasal spray, Place 1 spray into both nostrils daily., Disp: , Rfl:    meloxicam (MOBIC) 15 MG tablet, TAKE 1 TABLET BY MOUTH  DAILY AS NEEDED (Patient not taking: Reported on 07/13/2021), Disp: 90 tablet, Rfl: 1  Current Facility-Administered Medications:    sodium chloride flush (NS) 0.9 % injection 3 mL, 3 mL, Intravenous, Q12H, Skeet Latch, MD   Allergies  Allergen Reactions   Sulfa Antibiotics Rash   Sulfamethoxazole-Trimethoprim Rash      The patient states she uses post menopausal status for birth control. Last LMP was No LMP recorded. Patient has had a hysterectomy.. Negative for Dysmenorrhea and Negative for Menorrhagia. Negative for: breast discharge, breast lump(s), breast pain and breast self exam. Associated symptoms include abnormal vaginal bleeding. Pertinent negatives include abnormal bleeding (hematology), anxiety, decreased libido, depression, difficulty falling sleep, dyspareunia, history of infertility, nocturia, sexual dysfunction, sleep disturbances, urinary incontinence, urinary urgency, vaginal discharge and vaginal itching. Diet regular.The patient states her exercise level is  intermittent.  . The patient's tobacco use is:  Social History   Tobacco Use  Smoking Status Former   Packs/day: 0.50   Years: 15.00   Pack years: 7.50   Types: Cigarettes   Quit date: 2002   Years since quitting: 21.4   Passive exposure: Past  Smokeless Tobacco Never  . She has been exposed to passive smoke. The patient's alcohol use is:  Social History   Substance and Sexual Activity  Alcohol Use Yes   Comment: socially     Review of Systems  Constitutional: Negative.   HENT: Negative.    Eyes: Negative.  Negative for blurred vision.  Respiratory: Negative.    Cardiovascular: Negative.   Gastrointestinal: Negative.   Endocrine: Negative.   Genitourinary: Negative.   Musculoskeletal:  Positive for back pain and myalgias.  Skin:  Negative.   Allergic/Immunologic: Negative.   Neurological: Negative.   Hematological: Negative.   Psychiatric/Behavioral: Negative.      Today's Vitals   07/13/21 0944  BP: 122/70  Pulse: 85  Temp: 98.2 F (36.8 C)  Weight: 240 lb 3.2 oz (109 kg)  Height: 5' 4"  (1.626 m)  PainSc: 5   PainLoc: Back   Body mass index is 41.23 kg/m.  Wt Readings from Last 3 Encounters:  07/13/21 240 lb 3.2 oz (109 kg)  07/12/21 240 lb (108.9 kg)  06/06/21 249 lb 12.8 oz (113.3 kg)     Objective:  Physical Exam Vitals and nursing note reviewed.  Constitutional:      Appearance: Normal appearance. She is obese.  HENT:     Head: Normocephalic and atraumatic.     Right Ear: Tympanic membrane, ear canal and external ear normal.     Left Ear: Tympanic membrane, ear canal and external ear normal.     Nose: Nose normal.     Mouth/Throat:     Mouth: Mucous membranes are moist.  Pharynx: Oropharynx is clear.  Eyes:     Extraocular Movements: Extraocular movements intact.     Conjunctiva/sclera: Conjunctivae normal.     Pupils: Pupils are equal, round, and reactive to light.  Neck:     Thyroid: Thyroid mass and thyromegaly present.     Comments: Thyroid nodule on right Cardiovascular:     Rate and Rhythm: Normal rate and regular rhythm.     Pulses: Normal pulses.          Dorsalis pedis pulses are 2+ on the right side and 2+ on the left side.     Heart sounds: Normal heart sounds.  Pulmonary:     Effort: Pulmonary effort is normal.     Breath sounds: Normal breath sounds.  Abdominal:     General: Bowel sounds are normal.     Palpations: Abdomen is soft.     Comments: Obese, soft. Difficult to assess organomegaly.   Genitourinary:    Comments: deferred Musculoskeletal:        General: Normal range of motion.     Cervical back: Normal range of motion and neck supple.  Feet:     Right foot:     Protective Sensation: 5 sites tested.  5 sites sensed.     Skin integrity: Dry skin  present.     Toenail Condition: Right toenails are normal.     Left foot:     Protective Sensation: 5 sites tested.  5 sites sensed.     Skin integrity: Dry skin present.     Toenail Condition: Left toenails are normal.  Skin:    General: Skin is warm and dry.  Neurological:     General: No focal deficit present.     Mental Status: She is alert and oriented to person, place, and time.  Psychiatric:        Mood and Affect: Mood normal.        Behavior: Behavior normal.     Assessment And Plan:     1. Encounter for general adult medical examination w/o abnormal findings Comments: A full exam was performed. Importance of monthly self breast exams was discussed with the patient. PATIENT IS ADVISED TO GET 30-45 MINUTES REGULAR EXERCISE NO LESS THAN FOUR TO FIVE DAYS PER WEEK - BOTH WEIGHTBEARING EXERCISES AND AEROBIC ARE RECOMMENDED.  PATIENT IS ADVISED TO FOLLOW A HEALTHY DIET WITH AT LEAST SIX FRUITS/VEGGIES PER DAY, DECREASE INTAKE OF RED MEAT, AND TO INCREASE FISH INTAKE TO TWO DAYS PER WEEK.  MEATS/FISH SHOULD NOT BE FRIED, BAKED OR BROILED IS PREFERABLE.  IT IS ALSO IMPORTANT TO CUT BACK ON YOUR SUGAR INTAKE. PLEASE AVOID ANYTHING WITH ADDED SUGAR, CORN SYRUP OR OTHER SWEETENERS. IF YOU MUST USE A SWEETENER, YOU CAN TRY STEVIA. IT IS ALSO IMPORTANT TO AVOID ARTIFICIALLY SWEETENERS AND DIET BEVERAGES. LASTLY, I SUGGEST WEARING SPF 50 SUNSCREEN ON EXPOSED PARTS AND ESPECIALLY WHEN IN THE DIRECT SUNLIGHT FOR AN EXTENDED PERIOD OF TIME.  PLEASE AVOID FAST FOOD RESTAURANTS AND INCREASE YOUR WATER INTAKE. - Urine microalbumin-creatinine with uACR - CBC - CMP14+EGFR - Hemoglobin A1c  2. Dyslipidemia associated with type 2 diabetes mellitus ([place]) Comments: Diabetic foot exam was performed. She will f/u in 3-4 months for re-evaluation. I DISCUSSED WITH THE PATIENT AT LENGTH REGARDING THE GOALS OF GLYCEMIC CONTROL AND POSSIBLE LONG-TERM COMPLICATIONS.  I  ALSO STRESSED THE IMPORTANCE OF COMPLIANCE  WITH HOME GLUCOSE MONITORING, DIETARY RESTRICTIONS INCLUDING AVOIDANCE OF SUGARY DRINKS/PROCESSED FOODS,  ALONG WITH REGULAR EXERCISE.  I  ALSO STRESSED THE IMPORTANCE OF ANNUAL EYE EXAMS, SELF FOOT CARE AND COMPLIANCE WITH OFFICE VISITS. - Urine microalbumin-creatinine with uACR - Hemoglobin A1c  3. Essential hypertension, benign Comments: Chronic, controlled. EKG not performed, previous one from April 2023 reviewed. She is encouraged to follow low sodium diet. She will rto in 4 months for re-evaluation.  - Urinalysis, Complete (81001)  4. Myositis, unspecified myositis type, unspecified site Comments: Rheum recommends EMG/NCS. She was referred to [facility], they declined referral. I will refer her for further testing.  - Ambulatory referral to Neurology  5. Thyroid nodule Comments: She is also followed by Endo, Dr. Chalmers Cater.  She has upcoming thyroid u/s scheduled on 08/08/21.   6. Pulmonary nodule Comments: Recent CT scan results reviewed in detail. She would like to have f/u CT chest for further evaluation. Repeat CT chest due May 2024.   7. Class 3 severe obesity due to excess calories with serious comorbidity and body mass index (BMI) of 40.0 to 44.9 in adult [facility]) Comments: BMI 41. She is encouraged to gradually increase her daily activity, once cleared by Rheum. Initial goal BMI<35 to decrease cardiac risk.   Patient was given opportunity to ask questions. Patient verbalized understanding of the plan and was able to repeat key elements of the plan. All questions were answered to their satisfaction.   I, [name], MD, have reviewed all documentation for this visit. The documentation on 07/13/21 for the exam, diagnosis, procedures, and orders are all accurate and complete.   THE PATIENT IS ENCOURAGED TO PRACTICE SOCIAL DISTANCING DUE TO THE COVID-19 PANDEMIC.

## 2021-07-13 NOTE — Patient Instructions (Signed)

## 2021-07-14 LAB — CMP14+EGFR
ALT: 44 IU/L — ABNORMAL HIGH (ref 0–32)
AST: 34 IU/L (ref 0–40)
Albumin/Globulin Ratio: 1.6 (ref 1.2–2.2)
Albumin: 4.9 g/dL — ABNORMAL HIGH (ref 3.8–4.8)
Alkaline Phosphatase: 81 IU/L (ref 44–121)
BUN/Creatinine Ratio: 17 (ref 12–28)
BUN: 13 mg/dL (ref 8–27)
Bilirubin Total: 0.4 mg/dL (ref 0.0–1.2)
CO2: 22 mmol/L (ref 20–29)
Calcium: 10 mg/dL (ref 8.7–10.3)
Chloride: 99 mmol/L (ref 96–106)
Creatinine, Ser: 0.75 mg/dL (ref 0.57–1.00)
Globulin, Total: 3.1 g/dL (ref 1.5–4.5)
Glucose: 106 mg/dL — ABNORMAL HIGH (ref 70–99)
Potassium: 4 mmol/L (ref 3.5–5.2)
Sodium: 140 mmol/L (ref 134–144)
Total Protein: 8 g/dL (ref 6.0–8.5)
eGFR: 87 mL/min/{1.73_m2} (ref >59)

## 2021-07-14 LAB — URINALYSIS, COMPLETE
Bilirubin, UA: NEGATIVE
Glucose, UA: NEGATIVE
Ketones, UA: NEGATIVE
Nitrite, UA: NEGATIVE
Protein,UA: NEGATIVE
RBC, UA: NEGATIVE
Specific Gravity, UA: 1.009 (ref 1.005–1.030)
Urobilinogen, Ur: 0.2 mg/dL (ref 0.2–1.0)
pH, UA: 6.5 (ref 5.0–7.5)

## 2021-07-14 LAB — MICROSCOPIC EXAMINATION
Bacteria, UA: NONE SEEN
Casts: NONE SEEN /lpf
RBC, Urine: NONE SEEN /hpf (ref 0–2)
WBC, UA: NONE SEEN /hpf (ref 0–5)

## 2021-07-14 LAB — CBC
Hematocrit: 35.2 % (ref 34.0–46.6)
Hemoglobin: 11.9 g/dL (ref 11.1–15.9)
MCH: 28.1 pg (ref 26.6–33.0)
MCHC: 33.8 g/dL (ref 31.5–35.7)
MCV: 83 fL (ref 79–97)
Platelets: 322 x10E3/uL (ref 150–450)
RBC: 4.23 x10E6/uL (ref 3.77–5.28)
RDW: 12.8 % (ref 11.7–15.4)
WBC: 9.2 x10E3/uL (ref 3.4–10.8)

## 2021-07-14 LAB — MICROALBUMIN / CREATININE URINE RATIO
Creatinine, Urine: 44.5 mg/dL
Microalb/Creat Ratio: <7 mg/g{creat} (ref 0–29)
Microalbumin, Urine: <3 ug/mL

## 2021-07-14 LAB — HEMOGLOBIN A1C
Est. average glucose Bld gHb Est-mCnc: 157 mg/dL
Hgb A1c MFr Bld: 7.1 % — ABNORMAL HIGH (ref 4.8–5.6)

## 2021-07-15 ENCOUNTER — Other Ambulatory Visit: Payer: Self-pay | Admitting: Internal Medicine

## 2021-07-17 ENCOUNTER — Encounter: Payer: Self-pay | Admitting: Internal Medicine

## 2021-07-17 DIAGNOSIS — E041 Nontoxic single thyroid nodule: Secondary | ICD-10-CM | POA: Insufficient documentation

## 2021-07-17 DIAGNOSIS — R911 Solitary pulmonary nodule: Secondary | ICD-10-CM | POA: Insufficient documentation

## 2021-07-17 DIAGNOSIS — E782 Mixed hyperlipidemia: Secondary | ICD-10-CM | POA: Insufficient documentation

## 2021-07-17 DIAGNOSIS — M609 Myositis, unspecified: Secondary | ICD-10-CM | POA: Insufficient documentation

## 2021-07-18 ENCOUNTER — Other Ambulatory Visit: Payer: Self-pay

## 2021-07-18 MED ORDER — ACCU-CHEK GUIDE W/DEVICE KIT: PACK | 1 refills | Status: AC

## 2021-07-18 MED ORDER — ACCU-CHEK GUIDE VI STRP
ORAL_STRIP | 2 refills | Status: DC
Start: 2021-07-18 — End: 2022-01-11

## 2021-07-18 MED ORDER — ACCU-CHEK SOFTCLIX LANCETS MISC
2 refills | Status: DC
Start: 2021-07-18 — End: 2022-01-12

## 2021-07-19 ENCOUNTER — Other Ambulatory Visit: Payer: Medicare Other

## 2021-07-20 ENCOUNTER — Telehealth: Payer: Self-pay

## 2021-07-20 NOTE — Chronic Care Management (AMB) (Signed)
Chronic Care Management Pharmacy Assistant   Name: Michelle Hicks  MRN: [id number] DOB: 12/10/1950  Reason for Encounter: Disease State/ Diabetes  Recent office visits:  07-13-2021 Glendale Chard, MD. Glucose= 106, Albumin= 4.9, ALT= 44. A1C= 7.1. Referral placed to neurology.  06-06-2021 Glendale Chard, MD. Total CK= 960. Positive ANA Titer 1. Referral to physical therapy placed.  05-31-2021 Little, Claudette Stapler, RN (CCM).  04-18-2021 Little, Claudette Stapler, RN (CCM).  04-04-2021 Little, Claudette Stapler, RN (CCM).  03-31-2021 Glendale Chard, MD. A1C= 7.3. Referral placed to cardiology. START doxycycline 100 mg twice daily.  03-31-2021 Kellie Simmering, LPN. Medicare annual wellness visit.  Recent consult visits:  07-12-2021 Skeet Latch, MD (Cardiology). Referral placed to ENT. STOP linzess.  05-30-2021 Skeet Latch, MD (Cardiology). Glucose= 135, CO2= 19. EKG completed. Echocardiogram and CT coronary MORPH W/CTA COR W/SCORE W/CA W/CM ordered. START amlodipine 5 mg daily and metoprolol 100 mg take 2 hours before CT.  05-26-2021 Dohmeier, Asencion Partridge, MD (Neurology). STOP doxycycline and pravastatin.  04-12-2021 Ward Givens, NP (Neurology). Follow up. No changes.  Hospital visits:  None in previous 6 months  Medications: Outpatient Encounter Medications as of 07/20/2021  Medication Sig   Accu-Chek Softclix Lancets lancets Use as instructed to check blood sugars twice daily E11.69   amLODipine (NORVASC) 5 MG tablet Take 1 tablet (5 mg total) by mouth daily.   aspirin EC 81 MG tablet Take 81 mg by mouth daily. Some times   Blood Glucose Monitoring Suppl (ACCU-CHEK GUIDE) w/Device KIT Use to check blood sugars twice daily E11.69   Cholecalciferol (VITAMIN D-3 PO) Take 2,000 Units by mouth daily.    fluticasone (FLONASE) 50 MCG/ACT nasal spray Place 1 spray into both nostrils daily.   glucose blood (ACCU-CHEK GUIDE) test strip Use as instructed to check blood sugars twice daily E11.69    hydrochlorothiazide (HYDRODIURIL) 25 MG tablet Take 1 tablet (25 mg total) by mouth daily.   hydroxychloroquine (PLAQUENIL) 200 MG tablet Take 200 mg by mouth daily.   Lancets Misc. (ONE TOUCH SURESOFT) MISC Use as directed to check blood sugars 2 times per day dx e11.65   levothyroxine (SYNTHROID) 50 MCG tablet Take 50 mcg by mouth daily.    meloxicam (MOBIC) 15 MG tablet TAKE 1 TABLET BY MOUTH  DAILY AS NEEDED (Patient not taking: Reported on 07/13/2021)   Multiple Vitamin (MULTIVITAMIN) tablet Take 1 tablet by mouth daily.   omeprazole (PRILOSEC) 40 MG capsule TAKE 1 CAPSULE BY MOUTH  DAILY BEFORE A MEAL (Patient taking differently: Take 40 mg by mouth daily.)   ONETOUCH ULTRA test strip USE AS DIRECTED TO CHECK  BLOOD SUGAR TWICE DAILY   predniSONE (DELTASONE) 10 MG tablet Take 10 mg by mouth 2 (two) times daily with a meal. Taper   Semaglutide, 1 MG/DOSE, (OZEMPIC, 1 MG/DOSE,) 2 MG/1.5ML SOPN Inject 1 mg into the skin once a week.   telmisartan (MICARDIS) 80 MG tablet TAKE 1 TABLET BY MOUTH  DAILY   traZODone (DESYREL) 50 MG tablet Take 1 tablet (50 mg total) by mouth at bedtime as needed for sleep.   Facility-Administered Encounter Medications as of 07/20/2021  Medication   sodium chloride flush (NS) 0.9 % injection 3 mL  Recent Relevant Labs: Lab Results  Component Value Date/Time   HGBA1C 7.1 (H) 07/13/2021 10:45 AM   HGBA1C 7.3 (H) 03/31/2021 10:55 AM   HGBA1C 7.3 09/04/2017 12:00 AM   MICROALBUR 10 07/06/2020 12:47 PM   MICROALBUR 30 03/24/2020 10:27 AM  Kidney Function Lab Results  Component Value Date/Time   CREATININE 0.75 07/13/2021 10:45 AM   CREATININE 0.81 06/03/2021 08:01 AM   GFRNONAA 73 03/24/2020 10:34 AM   GFRAA 84 03/24/2020 10:34 AM    Current antihyperglycemic regimen:  Ozempic 1 mg weekly  What recent interventions/DTPs have been made to improve glycemic control:  Educated on Complications of diabetes including kidney damage, retinal damage, and  cardiovascular disease; Exercise goal of 150 minutes per week; -Counseled to check feet dailA1c and blood sugar goals; Exercise goal of 150 minutes per week;y and get yearly eye exams -Recommended to continue current medication  Have there been any recent hospitalizations or ED visits since last visit with CPP? No  Patient denies hypoglycemic symptoms  Patient denies hyperglycemic symptoms  How often are you checking your blood sugar? twice daily  What are your blood sugars ranging?  Fasting: 06-05 113, 06-06 110, 06-07 119 Before meals: None After meals:  06-05 145, 06-06 183, 06-07 167 Bedtime:None  During the week, how often does your blood glucose drop below 70? Never  Are you checking your feet daily/regularly? Daily  Adherence Review: Is the patient currently on a STATIN medication? Yes Is the patient currently on ACE/ARB medication? Yes Does the patient have >5 day gap between last estimated fill dates? No   Care Gaps: Yearly foot exam overdue AWV 04-13-2022  Star Rating Drugs: Telmisartan 80 mg- Last filled 05-13-2021 90 DS [facility] Ozempic 1 mg- Patient assistance   Marianne Pharmacist Assistant [phone]

## 2021-07-21 ENCOUNTER — Telehealth: Payer: Self-pay | Admitting: *Deleted

## 2021-07-21 NOTE — Telephone Encounter (Signed)
Cardiac Catheterization scheduled at [facility] for: Friday July 22, 2021 9 AM Arrival time and place: [facility] Main Entrance A at: 7 AM   Nothing to eat after midnight prior to procedure, clear liquids until 5 AM day of procedure.  Medication instructions: -Hold:  HCTZ-AM of procedure  -Except hold medications usual morning medications can be taken with sips of water including aspirin 81 mg.  Confirmed patient has responsible adult to drive home post procedure and be with patient first 24 hours after arriving home.  Patient reports no new symptoms concerning for COVID-19/no exposure to COVID-19 in the past 10 days.  Reviewed procedure instructions with patient.

## 2021-07-22 ENCOUNTER — Ambulatory Visit (HOSPITAL_COMMUNITY)
Admission: RE | Admit: 2021-07-22 | Discharge: 2021-07-22 | Disposition: A | Discharge status: Home / Self Care | Payer: Medicare Other | Attending: Cardiovascular Disease | Admitting: Cardiovascular Disease

## 2021-07-22 ENCOUNTER — Other Ambulatory Visit: Payer: Self-pay

## 2021-07-22 ENCOUNTER — Encounter (HOSPITAL_COMMUNITY): Payer: Self-pay | Admitting: Cardiovascular Disease

## 2021-07-22 ENCOUNTER — Encounter (HOSPITAL_COMMUNITY)
Admission: RE | Disposition: A | Discharge status: Home / Self Care | Payer: Self-pay | Source: Home / Self Care | Attending: Cardiovascular Disease

## 2021-07-22 DIAGNOSIS — G4733 Obstructive sleep apnea (adult) (pediatric): Secondary | ICD-10-CM | POA: Insufficient documentation

## 2021-07-22 DIAGNOSIS — E119 Type 2 diabetes mellitus without complications: Secondary | ICD-10-CM | POA: Diagnosis not present

## 2021-07-22 DIAGNOSIS — E7800 Pure hypercholesterolemia, unspecified: Secondary | ICD-10-CM | POA: Diagnosis not present

## 2021-07-22 DIAGNOSIS — R0602 Shortness of breath: Secondary | ICD-10-CM | POA: Insufficient documentation

## 2021-07-22 DIAGNOSIS — I10 Essential (primary) hypertension: Secondary | ICD-10-CM | POA: Diagnosis not present

## 2021-07-22 DIAGNOSIS — Z87891 Personal history of nicotine dependence: Secondary | ICD-10-CM | POA: Diagnosis not present

## 2021-07-22 DIAGNOSIS — I318 Other specified diseases of pericardium: Secondary | ICD-10-CM | POA: Diagnosis not present

## 2021-07-22 DIAGNOSIS — F32A Depression, unspecified: Secondary | ICD-10-CM | POA: Insufficient documentation

## 2021-07-22 HISTORY — PX: RIGHT/LEFT HEART CATH AND CORONARY ANGIOGRAPHY: CATH118266

## 2021-07-22 LAB — POCT I-STAT EG7
Acid-base deficit: 2 mmol/L (ref 0.0–2.0)
Acid-base deficit: 2 mmol/L (ref 0.0–2.0)
Bicarbonate: 22.8 mmol/L (ref 20.0–28.0)
Bicarbonate: 23 mmol/L (ref 20.0–28.0)
Calcium, Ion: 1.23 mmol/L (ref 1.15–1.40)
Calcium, Ion: 1.25 mmol/L (ref 1.15–1.40)
HCT: 34 % — ABNORMAL LOW (ref 36.0–46.0)
HCT: 34 % — ABNORMAL LOW (ref 36.0–46.0)
Hemoglobin: 11.6 g/dL — ABNORMAL LOW (ref 12.0–15.0)
Hemoglobin: 11.6 g/dL — ABNORMAL LOW (ref 12.0–15.0)
O2 Saturation: 75 %
O2 Saturation: 77 %
Potassium: 3.7 mmol/L (ref 3.5–5.1)
Potassium: 3.7 mmol/L (ref 3.5–5.1)
Sodium: 139 mmol/L (ref 135–145)
Sodium: 140 mmol/L (ref 135–145)
TCO2: 24 mmol/L (ref 22–32)
TCO2: 24 mmol/L (ref 22–32)
pCO2, Ven: 36.9 mmHg — ABNORMAL LOW (ref 44–60)
pCO2, Ven: 37.2 mmHg — ABNORMAL LOW (ref 44–60)
pH, Ven: 7.398 (ref 7.25–7.43)
pH, Ven: 7.4 (ref 7.25–7.43)
pO2, Ven: 40 mmHg (ref 32–45)
pO2, Ven: 42 mmHg (ref 32–45)

## 2021-07-22 LAB — POCT I-STAT 7, (LYTES, BLD GAS, ICA,H+H)
Acid-base deficit: 1 mmol/L (ref 0.0–2.0)
Bicarbonate: 23.5 mmol/L (ref 20.0–28.0)
Calcium, Ion: 1.23 mmol/L (ref 1.15–1.40)
HCT: 34 % — ABNORMAL LOW (ref 36.0–46.0)
Hemoglobin: 11.6 g/dL — ABNORMAL LOW (ref 12.0–15.0)
O2 Saturation: 99 %
Potassium: 3.7 mmol/L (ref 3.5–5.1)
Sodium: 138 mmol/L (ref 135–145)
TCO2: 25 mmol/L (ref 22–32)
pCO2 arterial: 37.7 mmHg (ref 32–48)
pH, Arterial: 7.402 (ref 7.35–7.45)
pO2, Arterial: 144 mmHg — ABNORMAL HIGH (ref 83–108)

## 2021-07-22 LAB — GLUCOSE, CAPILLARY: Glucose-Capillary: 123 mg/dL — ABNORMAL HIGH (ref 70–99)

## 2021-07-22 SURGERY — RIGHT/LEFT HEART CATH AND CORONARY ANGIOGRAPHY
Anesthesia: LOCAL

## 2021-07-22 MED ORDER — FENTANYL CITRATE (PF) 100 MCG/2ML IJ SOLN
INTRAMUSCULAR | Status: AC
  Filled 2021-07-22: qty 2

## 2021-07-22 MED ORDER — ASPIRIN 81 MG PO CHEW: 81.0000 mg | CHEWABLE_TABLET | ORAL | Status: DC

## 2021-07-22 MED ORDER — HEPARIN SODIUM (PORCINE) 1000 UNIT/ML IJ SOLN
INTRAMUSCULAR | Status: AC
  Filled 2021-07-22: qty 10

## 2021-07-22 MED ORDER — ONDANSETRON HCL 4 MG/2ML IJ SOLN: 4.0000 mg | Freq: Four times a day (QID) | INTRAMUSCULAR | Status: DC | PRN

## 2021-07-22 MED ORDER — HEPARIN (PORCINE) IN NACL 1000-0.9 UT/500ML-% IV SOLN
INTRAVENOUS | Status: AC
  Filled 2021-07-22: qty 1000

## 2021-07-22 MED ORDER — VERAPAMIL HCL 2.5 MG/ML IV SOLN
INTRAVENOUS | Status: AC
  Filled 2021-07-22: qty 2

## 2021-07-22 MED ORDER — LIDOCAINE HCL (PF) 1 % IJ SOLN
INTRAMUSCULAR | Status: DC | PRN
  Administered 2021-07-22 (×2): 2 mL

## 2021-07-22 MED ORDER — LABETALOL HCL 5 MG/ML IV SOLN: 10.0000 mg | INTRAVENOUS | Status: DC | PRN

## 2021-07-22 MED ORDER — FENTANYL CITRATE (PF) 100 MCG/2ML IJ SOLN
INTRAMUSCULAR | Status: DC | PRN
Start: 2021-07-22 — End: 2021-07-22
  Administered 2021-07-22 (×2): 25 ug via INTRAVENOUS

## 2021-07-22 MED ORDER — HEPARIN (PORCINE) IN NACL 1000-0.9 UT/500ML-% IV SOLN
INTRAVENOUS | Status: DC | PRN
  Administered 2021-07-22 (×2): 500 mL

## 2021-07-22 MED ORDER — VERAPAMIL HCL 2.5 MG/ML IV SOLN
INTRAVENOUS | Status: DC | PRN
  Administered 2021-07-22: 10 mL via INTRA_ARTERIAL

## 2021-07-22 MED ORDER — ACETAMINOPHEN 325 MG PO TABS: 650.0000 mg | ORAL_TABLET | ORAL | Status: DC | PRN

## 2021-07-22 MED ORDER — SODIUM CHLORIDE 0.9 % IV SOLN: 250.0000 mL | INTRAVENOUS | Status: DC | PRN

## 2021-07-22 MED ORDER — SODIUM CHLORIDE 0.9% FLUSH: 3.0000 mL | Freq: Two times a day (BID) | INTRAVENOUS | Status: DC

## 2021-07-22 MED ORDER — SODIUM CHLORIDE 0.9% FLUSH: 3.0000 mL | INTRAVENOUS | Status: DC | PRN

## 2021-07-22 MED ORDER — SODIUM CHLORIDE 0.9 % WEIGHT BASED INFUSION
3.0000 mL/kg/h | INTRAVENOUS | Status: AC
  Administered 2021-07-22: 3 mL/kg/h via INTRAVENOUS

## 2021-07-22 MED ORDER — IOHEXOL 350 MG/ML SOLN
INTRAVENOUS | Status: DC | PRN
  Administered 2021-07-22: 30 mL

## 2021-07-22 MED ORDER — SODIUM CHLORIDE 0.9 % WEIGHT BASED INFUSION: 1.0000 mL/kg/h | INTRAVENOUS | Status: DC

## 2021-07-22 MED ORDER — MIDAZOLAM HCL 2 MG/2ML IJ SOLN
INTRAMUSCULAR | Status: AC
  Filled 2021-07-22: qty 2

## 2021-07-22 MED ORDER — HYDRALAZINE HCL 20 MG/ML IJ SOLN: 10.0000 mg | INTRAMUSCULAR | Status: DC | PRN

## 2021-07-22 MED ORDER — LIDOCAINE HCL (PF) 1 % IJ SOLN
INTRAMUSCULAR | Status: AC
  Filled 2021-07-22: qty 30

## 2021-07-22 MED ORDER — MIDAZOLAM HCL 2 MG/2ML IJ SOLN
INTRAMUSCULAR | Status: DC | PRN
  Administered 2021-07-22 (×2): 1 mg via INTRAVENOUS

## 2021-07-22 MED ORDER — HEPARIN SODIUM (PORCINE) 1000 UNIT/ML IJ SOLN
INTRAMUSCULAR | Status: DC | PRN
  Administered 2021-07-22: 5000 [IU] via INTRAVENOUS

## 2021-07-22 SURGICAL SUPPLY — 16 items
BAND CMPR LRG ZPHR (HEMOSTASIS) ×1
BAND ZEPHYR COMPRESS 30 LONG (HEMOSTASIS) ×1 IMPLANT
CATH 5FR JL3.5 JR4 ANG PIG MP (CATHETERS) ×1 IMPLANT
CATH BALLN WEDGE 5F 110CM (CATHETERS) ×1 IMPLANT
GLIDESHEATH SLEND SS 6F .021 (SHEATH) ×1 IMPLANT
GUIDEWIRE .025 260CM (WIRE) ×1 IMPLANT
GUIDEWIRE INQWIRE 1.5J.035X260 (WIRE) IMPLANT
INQWIRE 1.5J .035X260CM (WIRE) ×2
KIT HEART LEFT (KITS) ×2 IMPLANT
PACK CARDIAC CATHETERIZATION (CUSTOM PROCEDURE TRAY) ×2 IMPLANT
SHEATH GLIDE SLENDER 4/5FR (SHEATH) ×1 IMPLANT
SHEATH PROBE COVER 6X72 (BAG) ×1 IMPLANT
TRANSDUCER W/STOPCOCK (MISCELLANEOUS) ×3 IMPLANT
TUBING ART PRESS 72  MALE/FEM (TUBING) ×2
TUBING ART PRESS 72 MALE/FEM (TUBING) IMPLANT
TUBING CIL FLEX 10 FLL-RA (TUBING) ×2 IMPLANT

## 2021-07-22 NOTE — Interval H&P Note (Signed)
History and Physical Interval Note:  07/22/2021 8:15 AM  Michelle Hicks  has presented today for surgery, with the diagnosis of constriction.  The various methods of treatment have been discussed with the patient and family. After consideration of risks, benefits and other options for treatment, the patient has consented to  Procedure(s): RIGHT/LEFT HEART CATH AND CORONARY ANGIOGRAPHY (N/A) as a surgical intervention.  The patient's history has been reviewed, patient examined, no change in status, stable for surgery.  I have reviewed the patient's chart and labs.  Questions were answered to the patient's satisfaction.     Sherren Mocha

## 2021-07-22 NOTE — Progress Notes (Signed)
Patient and husband was given discharge instructions. Both verbalized understanding. 

## 2021-07-22 NOTE — Discharge Instructions (Signed)

## 2021-07-27 ENCOUNTER — Encounter (HOSPITAL_BASED_OUTPATIENT_CLINIC_OR_DEPARTMENT_OTHER): Payer: Self-pay | Admitting: Cardiovascular Disease

## 2021-07-27 ENCOUNTER — Ambulatory Visit (INDEPENDENT_AMBULATORY_CARE_PROVIDER_SITE_OTHER): Payer: Medicare Other | Admitting: Cardiovascular Disease

## 2021-07-27 ENCOUNTER — Telehealth: Payer: Self-pay

## 2021-07-27 VITALS — BP 130/75 | HR 94 | Ht 64.0 in | Wt 239.7 lb

## 2021-07-27 DIAGNOSIS — G4733 Obstructive sleep apnea (adult) (pediatric): Secondary | ICD-10-CM

## 2021-07-27 DIAGNOSIS — Z6841 Body Mass Index (BMI) 40.0 and over, adult: Secondary | ICD-10-CM

## 2021-07-27 DIAGNOSIS — I318 Other specified diseases of pericardium: Secondary | ICD-10-CM | POA: Diagnosis not present

## 2021-07-27 DIAGNOSIS — Z9989 Dependence on other enabling machines and devices: Secondary | ICD-10-CM

## 2021-07-27 NOTE — Assessment & Plan Note (Signed)
Blood pressure is controlled today.  She is going to work on increasing her exercise.  Continue hydrochlorothiazide, amlodipine, and telmisartan.

## 2021-07-27 NOTE — Telephone Encounter (Signed)
CARE PLAN ENTRY (see longitudinal plan of care for additional care plan information)  Current Barriers:  Financial Barriers: patient has [employer] insurance and reports copay for Ozempic is cost prohibitive at this time   Pharmacist Clinical Goal(s):  Over the next 30 days, patient will work with PharmD and providers to relieve medication access concerns  Interventions: Comprehensive medication review completed; medication list updated in electronic medical record.  Inter-disciplinary care team collaboration (see longitudinal plan of care)   Patient Self Care Activities:  Patients Ozempic ready for pick up from [employer]. Patient made aware.  Orlando Penner, CPP, PharmD Clinical Pharmacist Practitioner [facility] [phone]

## 2021-07-27 NOTE — Assessment & Plan Note (Signed)
Continue CPAP.  

## 2021-07-27 NOTE — Patient Instructions (Addendum)
Medication Instructions:  Your physician recommends that you continue on your current medications as directed. Please refer to the Current Medication list given to you today.  *If you need a refill on your cardiac medications before your next appointment, please call your pharmacy*  Lab Work: NONE  Testing/Procedures: Your physician has requested that you have an echocardiogram. Echocardiography is a painless test that uses sound waves to create images of your heart. It provides your doctor with information about the size and shape of your heart and how well your heart's chambers and valves are working. This procedure takes approximately one hour. There are no restrictions for this procedure.  TO BE DONE APRIL 2024  Follow-Up: At [facility], you and your health needs are our priority.  As part of our continuing mission to provide you with exceptional heart care, we have created designated Provider Care Teams.  These Care Teams include your primary Cardiologist (physician) and Advanced Practice Providers (APPs -  Physician Assistants and Nurse Practitioners) who all work together to provide you with the care you need, when you need it.  We recommend signing up for the patient portal called "MyChart".  Sign up information is provided on this After Visit Summary.  MyChart is used to connect with patients for Virtual Visits (Telemedicine).  Patients are able to view lab/test results, encounter notes, upcoming appointments, etc.  Non-urgent messages can be sent to your provider as well.   To learn more about what you can do with MyChart, go to [email or web address].    Your next appointment:    Provider:    AFTER ECHO IN APRIL 2024  LORA OR PAM FROM PREP PROGRAM WILL BE IN [facility]

## 2021-07-27 NOTE — Assessment & Plan Note (Signed)
Noted on imaging.  Given her shortness of breath she did undergo left and right heart catheterization.  There was no evidence of constriction.  I do not think this is contributing to her symptoms.  She is currently undergoing a work-up for rheumatological disease and it is possible this is related.  We will repeat her echo in a year to make sure it is stable.

## 2021-07-27 NOTE — Progress Notes (Signed)
Cardiology Office Note   Date:  07/27/2021   ID:  Michelle Hicks, DOB [birth year], MRN [id number]  PCP:  Glendale Chard, MD  Cardiologist:  None  Electrophysiologist:  None   Evaluation Performed:  Follow-Up Visit  Chief Complaint:  shortness of breath  History of Present Illness:    Michelle Hicks is a 71 y.o. female with a hx of hypertension, diabetes mellitus, OSA, and depression, here for follow-up. She saw Dr. Baird Cancer 03/2021 and noted she was more short of breath with daily activities. She was not getting much exercise at the time. She was referred to cardiology and encouraged to increase her physical activity once cleared.  She reported noticing worsening shortness of breath in the preceding 6 months.  It occurred both with exertion or daily activities. While cleaning around the house she is "laboring for breath."  Blood pressures were labile but elevated as high as the 150s at home.  She was referred for an echocardiogram 05/2021 that revealed LVEF 60 to 65% with mild LVH and moderate hypertrophy of the basal septum.  She had normal diastolic function.  She had a coronary CTA/28/23 that revealed normal coronary.  However there is some thickening and calcification of her pericardium.  There was concern for elevated pulmonary pressures.  The study showed signs of GERD and a small pulmonary nodule.  She has had some issues with muscle stiffness but did not improve after stopping her statin.  Dr. Baird Cancer read some autoimmune labs and found that she had a positive ANA with a speckled pattern at 1:1280.  ESR was within normal limits.  CK was 960.  She recommended repeat referral to rheumatology which is pending.   At her last appointment we discussed her pericardial calcification.  She was sent for a left and right heart cath which revealed normal coronary arteries and no evidence of ventricular interdependence. Today, she states her breathing lately has been stable. She is generally feeling  fine other than some acid reflux. Her blood pressures recently have been around 992-426 systolic. However, she also reports an episode of 97 systolic blood pressure a couple weeks ago. At that time she felt very lightheaded and dizzy, as well as near syncopal. This was the first time her symptoms were that severe, but there have been no recurring episodes. Of note, on that day she had also taken her Ozempic. She has not been getting a lot of exercise, but recently returned to trying some walking. She plans to work on increasing her exercise. She denies any chest pain/pressure or swelling in her feet.  She has successfully lost 10 lbs since the last visit. She continues to work on her weight loss. We reviewed the results of her heart catheterization in detail. She denies any palpitations, or peripheral edema. No headaches, orthopnea, or PND.   Past Medical History:  Diagnosis Date   Depression    DM (diabetes mellitus) ([place])    Exertional dyspnea 05/30/2021   HTN (hypertension)    Pericardial calcification 07/12/2021   Tachycardia 05/30/2021    Past Surgical History:  Procedure Laterality Date   ABDOMINAL HYSTERECTOMY     RIGHT/LEFT HEART CATH AND CORONARY ANGIOGRAPHY N/A 07/22/2021   Procedure: RIGHT/LEFT HEART CATH AND CORONARY ANGIOGRAPHY;  Surgeon: Sherren Mocha, MD;  Location: [facility];  Service: Cardiovascular;  Laterality: N/A;   TONSILLECTOMY Bilateral 1967    Current Medications: No outpatient medications have been marked as taking for the 07/27/21 encounter (Office Visit) with  Skeet Latch, MD.   Current Facility-Administered Medications for the 07/27/21 encounter (Office Visit) with Skeet Latch, MD  Medication   sodium chloride flush (NS) 0.9 % injection 3 mL     Allergies:   Sulfa antibiotics and Sulfamethoxazole-trimethoprim   Social History   Socioeconomic History   Marital status: Married    Spouse name: Not on file   Number of children: Not on file    Years of education: Not on file   Highest education level: Not on file  Occupational History   Occupation: retired  Tobacco Use   Smoking status: Former    Packs/day: 0.50    Years: 15.00    Total pack years: 7.50    Types: Cigarettes    Quit date: 2002    Years since quitting: 21.4    Passive exposure: Past   Smokeless tobacco: Never  Vaping Use   Vaping Use: Never used  Substance and Sexual Activity   Alcohol use: Yes    Comment: socially    Drug use: No   Sexual activity: Not Currently    Partners: Male    Birth control/protection: Surgical  Other Topics Concern   Not on file  Social History Narrative   Not on file   Social Determinants of Health   Financial Resource Strain: High Risk (04/29/2021)   Overall Financial Resource Strain (CARDIA)    Difficulty of Paying Living Expenses: Very hard  Food Insecurity: No Food Insecurity (03/31/2021)   Hunger Vital Sign    Worried About Running Out of Food in the Last Year: Never true    [place] in the Last Year: Never true  Transportation Needs: No Transportation Needs (03/31/2021)   PRAPARE - Hydrologist (Medical): No    Lack of Transportation (Non-Medical): No  Physical Activity: Inactive (03/31/2021)   Exercise Vital Sign    Days of Exercise per Week: 0 days    Minutes of Exercise per Session: 0 min  Stress: No Stress Concern Present (03/31/2021)   [place]    Feeling of Stress : Only a little  Social Connections: Not on file     Family History: The patient's family history includes Diabetes in her mother; Heart attack (age of onset: 25) in her father; Hypertension in her father; Pancreatic cancer in her mother.  ROS:   Please see the history of present illness.    (+) Acid reflux (+) Isolated episode of near syncope with dizziness All other systems reviewed and are negative.  EKGs/Labs/Other Studies  Reviewed:    The following studies were reviewed today:  Left LE Venous Doppler 07/30/2020: Summary:   LEFT:  - There is no evidence of deep vein thrombosis in the lower extremity.  - There is no evidence of superficial venous thrombosis.     - No cystic structure found in the popliteal fossa.  Echo 06/09/21: IMPRESSIONS    1. Left ventricular ejection fraction, by estimation, is 60 to 65%. Left  ventricular ejection fraction by 3D volume is 62 %. The left ventricle has  normal function. The left ventricle has no regional wall motion  abnormalities. There is mild concentric  left ventricular hypertrophy and moderate basal septal hypertrophy.   2. Right ventricular systolic function is normal. The right ventricular  size is mildly enlarged. Tricuspid regurgitation signal is inadequate for  assessing PA pressure.   3. Right atrial size was mildly dilated.  4. The mitral valve is normal in structure. No evidence of mitral valve  regurgitation. No evidence of mitral stenosis.   5. The aortic valve is normal in structure. There is moderate  calcification of the aortic valve. Aortic valve regurgitation is not  visualized. Aortic valve sclerosis/calcification is present, without any  evidence of aortic stenosis. Aortic valve area,  by VTI measures 2.20 cm. Aortic valve mean gradient measures 6.0 mmHg.  Aortic valve Vmax measures 1.58 m/s.   6. The inferior vena cava is normal in size with greater than 50%  respiratory variability, suggesting right atrial pressure of 3 mmHg.   Coronary CTA  06/11/21: IMPRESSION: 1. Tortuous coronary arteries with no evidence of CAD, CADRADS = 0. Image quality degraded by reduced signal to noise ratio, no definite proximal or mid plaque or coronary calcifications seen.   2. Coronary calcium score of 0.   3. Normal coronary origins with left dominance.   4. Scattered pericardial calcifications seen circumferentially. For indication of dyspnea on  exertion, consider evaluation for constrictive pericarditis.   5. Mild dilation of main pulmonary artery, 31 mm, may suggest increased pulmonary pressure.   LHC/RHC 07/2021: Angiographically normal coronary arteries (left dominant) Normal/low right and left heart pressures No ventricular interdependence with simultaneous LV/RV pressure recording   Normal study - no hemodynamic evidence for constrictive pericarditis     EKG:   EKG is personally reviewed. 07/27/2021: EKG was not ordered.  05/30/2021: Sinus rhythm. Rate 98 bpm.  Recent Labs: 06/06/2021: TSH 0.876 07/13/2021: ALT 44; BUN 13; Creatinine, Ser 0.75; Platelets 322 07/22/2021: Hemoglobin 11.6; Potassium 3.7; Sodium 139   Recent Lipid Panel    Component Value Date/Time   CHOL 159 03/31/2021 1055   TRIG 96 03/31/2021 1055   HDL 63 03/31/2021 1055   CHOLHDL 2.5 03/31/2021 1055   LDLCALC 78 03/31/2021 1055    Physical Exam:    VS:  BP 130/75 (BP Location: Left Arm, Patient Position: Sitting, Cuff Size: Large)   Pulse 94   Ht _0  (1.626 m)   Wt 239 lb 11.2 oz (108.7 kg)   SpO2 100%   BMI 41.14 kg/m  , BMI Body mass index is 41.14 kg/m. GENERAL:  Well appearing HEENT: Pupils equal round and reactive, fundi not visualized, oral mucosa unremarkable NECK:  No jugular venous distention, waveform within normal limits, carotid upstroke brisk and symmetric, no bruits, no thyromegaly LUNGS:  Clear to auscultation bilaterally HEART:  RRR.  PMI not displaced or sustained,S1 and S2 within normal limits, no S3, no S4, no clicks, no rubs, no murmurs ABD:  Flat, positive bowel sounds normal in frequency in pitch, no bruits, no rebound, no guarding, no midline pulsatile mass, no hepatomegaly, no splenomegaly EXT:  2 plus pulses throughout, no edema, no cyanosis no clubbing SKIN:  No rashes no nodules NEURO:  Cranial nerves II through XII grossly intact, motor grossly intact throughout PSYCH:  Cognitively intact, oriented to person  place and time  ASSESSMENT:    1. OSA on CPAP   2. Class 3 severe obesity due to excess calories with serious comorbidity and body mass index (BMI) of 40.0 to 44.9 in adult ([place])   3. Pericardial calcification      PLAN:    Essential hypertension, benign Blood pressure is controlled today.  She is going to work on increasing her exercise.  Continue hydrochlorothiazide, amlodipine, and telmisartan.  OSA on CPAP Continue CPAP.  Class 3 severe obesity due to excess calories with  serious comorbidity and body mass index (BMI) of 40.0 to 44.9 in adult [facility]) She is doing a good job of losing weight.  She lost 10 pounds in the last couple months.  Continue Ozempic.  We will also refer her to the PREP program at the [facility].  Pericardial calcification Noted on imaging.  Given her shortness of breath she did undergo left and right heart catheterization.  There was no evidence of constriction.  I do not think this is contributing to her symptoms.  She is currently undergoing a work-up for rheumatological disease and it is possible this is related.  We will repeat her echo in a year to make sure it is stable.  Disposition: FU with Lynsey Ange C. Oval Linsey, MD, [facility] in one year following her repeat echo.   Medication Adjustments/Labs and Tests Ordered: Current medicines are reviewed at length with the patient today.  Concerns regarding medicines are outlined above.   Orders Placed This Encounter  Procedures   Amb Referral To Provider Referral Exercise Program (P.R.E.P)   ECHOCARDIOGRAM COMPLETE   No orders of the defined types were placed in this encounter.  Patient Instructions  Medication Instructions:  Your physician recommends that you continue on your current medications as directed. Please refer to the Current Medication list given to you today.  *If you need a refill on your cardiac medications before your next appointment, please call your pharmacy*  Lab  Work: NONE  Testing/Procedures: Your physician has requested that you have an echocardiogram. Echocardiography is a painless test that uses sound waves to create images of your heart. It provides your doctor with information about the size and shape of your heart and how well your heart's chambers and valves are working. This procedure takes approximately one hour. There are no restrictions for this procedure.  TO BE DONE APRIL 2024  Follow-Up: At [facility], you and your health needs are our priority.  As part of our continuing mission to provide you with exceptional heart care, we have created designated Provider Care Teams.  These Care Teams include your primary Cardiologist (physician) and Advanced Practice Providers (APPs -  Physician Assistants and Nurse Practitioners) who all work together to provide you with the care you need, when you need it.  We recommend signing up for the patient portal called "MyChart".  Sign up information is provided on this After Visit Summary.  MyChart is used to connect with patients for Virtual Visits (Telemedicine).  Patients are able to view lab/test results, encounter notes, upcoming appointments, etc.  Non-urgent messages can be sent to your provider as well.   To learn more about what you can do with MyChart, go to [email or web address].    Your next appointment:    Provider:    AFTER ECHO IN APRIL 2024  LORA OR PAM FROM PREP PROGRAM WILL BE IN TOUCH      I,Jenifer Velasquez,acting as a scribe for Skeet Latch, MD.,have documented all relevant documentation on the behalf of Skeet Latch, MD,as directed by  Skeet Latch, MD while in the presence of Skeet Latch, MD.   I, [place] Oval Linsey, MD have reviewed all documentation for this visit.  The documentation of the exam, diagnosis, procedures, and orders on 07/27/2021 are all accurate and complete.    Signed, Skeet Latch, MD  07/27/2021 11:05 AM    [place]

## 2021-07-27 NOTE — Progress Notes (Signed)
Cardiology Office Note   Date:  07/27/2021   ID:  Michelle Hicks, DOB Jun 22, 1950, MRN [id number]  PCP:  Glendale Chard, MD  Cardiologist:  None  Electrophysiologist:  None   Evaluation Performed:  Follow-Up Visit  Chief Complaint:  shortness of breath  History of Present Illness:    Michelle Hicks is a 71 y.o. female with a hx of hypertension, diabetes mellitus, and depression, here for follow-up. She saw Dr. Baird Cancer 03/2021 and noted she was more short of breath with daily activities. She was not getting much exercise at the time. She was referred to cardiology and encouraged to increase her physical activity once cleared.  She reported noticing worsening shortness of breath in the preceding 6 months.  It occurred both with exertion or daily activities. While cleaning around the house she is "laboring for breath."  Blood pressures were labile but elevated as high as the 150s at home.  She was referred for an echocardiogram 05/2021 that revealed LVEF 60 to 65% with mild LVH and moderate hypertrophy of the basal septum.  She had normal diastolic function.  She had a coronary CTA/28/23 that revealed normal coronary.  However there is some thickening and calcification of her pericardium.  There was concern for elevated pulmonary pressures.  The study showed signs of GERD and a small pulmonary nodule.  She has had some issues with muscle stiffness but did not improve after stopping her statin.  Dr. Baird Cancer read some autoimmune labs and found that she had a positive ANA with a speckled pattern at 1:1280.  ESR was within normal limits.  CK was 960.  She recommended repeat referral to rheumatology which is pending.   At her last appointment we discussed her pericardial calcification.  She was sent for a left and right heart cath which revealed normal coronary arteries and no evidence of ventricular interdependence.   Past Medical History:  Diagnosis Date   Depression    DM (diabetes mellitus)  ([place])    Exertional dyspnea 05/30/2021   HTN (hypertension)    Pericardial calcification 07/12/2021   Tachycardia 05/30/2021    Past Surgical History:  Procedure Laterality Date   ABDOMINAL HYSTERECTOMY     RIGHT/LEFT HEART CATH AND CORONARY ANGIOGRAPHY N/A 07/22/2021   Procedure: RIGHT/LEFT HEART CATH AND CORONARY ANGIOGRAPHY;  Surgeon: Sherren Mocha, MD;  Location: [facility];  Service: Cardiovascular;  Laterality: N/A;   TONSILLECTOMY Bilateral 1967    Current Medications: No outpatient medications have been marked as taking for the 07/27/21 encounter (Office Visit) with Skeet Latch, MD.   Current Facility-Administered Medications for the 07/27/21 encounter (Office Visit) with Skeet Latch, MD  Medication   sodium chloride flush (NS) 0.9 % injection 3 mL     Allergies:   Sulfa antibiotics and Sulfamethoxazole-trimethoprim   Social History   Socioeconomic History   Marital status: Married    Spouse name: Not on file   Number of children: Not on file   Years of education: Not on file   Highest education level: Not on file  Occupational History   Occupation: retired  Tobacco Use   Smoking status: Former    Packs/day: 0.50    Years: 15.00    Total pack years: 7.50    Types: Cigarettes    Quit date: 2002    Years since quitting: 21.4    Passive exposure: Past   Smokeless tobacco: Never  Vaping Use   Vaping Use: Never used  Substance and Sexual  Activity   Alcohol use: Yes    Comment: socially    Drug use: No   Sexual activity: Not Currently    Partners: Male    Birth control/protection: Surgical  Other Topics Concern   Not on file  Social History Narrative   Not on file   Social Determinants of Health   Financial Resource Strain: High Risk (04/29/2021)   Overall Financial Resource Strain (CARDIA)    Difficulty of Paying Living Expenses: Very hard  Food Insecurity: No Food Insecurity (03/31/2021)   Hunger Vital Sign    Worried About Running  Out of Food in the Last Year: Never true    Ran Out of Food in the Last Year: Never true  Transportation Needs: No Transportation Needs (03/31/2021)   PRAPARE - Hydrologist (Medical): No    Lack of Transportation (Non-Medical): No  Physical Activity: Inactive (03/31/2021)   Exercise Vital Sign    Days of Exercise per Week: 0 days    Minutes of Exercise per Session: 0 min  Stress: No Stress Concern Present (03/31/2021)   [place]    Feeling of Stress : Only a little  Social Connections: Not on file     Family History: The patient's family history includes Diabetes in her mother; Heart attack (age of onset: 56) in her father; Hypertension in her father; Pancreatic cancer in her mother.  ROS:   Please see the history of present illness.    (+) Shortness of breath (+) Acid reflux All other systems reviewed and are negative.  EKGs/Labs/Other Studies Reviewed:    The following studies were reviewed today:  Left LE Venous Doppler 07/30/2020: Summary:   LEFT:  - There is no evidence of deep vein thrombosis in the lower extremity.  - There is no evidence of superficial venous thrombosis.     - No cystic structure found in the popliteal fossa.  Echo 06/09/21: IMPRESSIONS     1. Left ventricular ejection fraction, by estimation, is 60 to 65%. Left  ventricular ejection fraction by 3D volume is 62 %. The left ventricle has  normal function. The left ventricle has no regional wall motion  abnormalities. There is mild concentric  left ventricular hypertrophy and moderate basal septal hypertrophy.   2. Right ventricular systolic function is normal. The right ventricular  size is mildly enlarged. Tricuspid regurgitation signal is inadequate for  assessing PA pressure.   3. Right atrial size was mildly dilated.   4. The mitral valve is normal in structure. No evidence of mitral valve   regurgitation. No evidence of mitral stenosis.   5. The aortic valve is normal in structure. There is moderate  calcification of the aortic valve. Aortic valve regurgitation is not  visualized. Aortic valve sclerosis/calcification is present, without any  evidence of aortic stenosis. Aortic valve area,  by VTI measures 2.20 cm. Aortic valve mean gradient measures 6.0 mmHg.  Aortic valve Vmax measures 1.58 m/s.   6. The inferior vena cava is normal in size with greater than 50%  respiratory variability, suggesting right atrial pressure of 3 mmHg.   Coronary CTA/29/23: IMPRESSION: 1. Tortuous coronary arteries with no evidence of CAD, CADRADS = 0. Image quality degraded by reduced signal to noise ratio, no definite proximal or mid plaque or coronary calcifications seen.   2. Coronary calcium score of 0.   3. Normal coronary origins with left dominance.   4. Scattered pericardial  calcifications seen circumferentially. For indication of dyspnea on exertion, consider evaluation for constrictive pericarditis.   5. Mild dilation of main pulmonary artery, 31 mm, may suggest increased pulmonary pressure.   LHC/RHC 07/2021: Angiographically normal coronary arteries (left dominant) Normal/low right and left heart pressures No ventricular interdependence with simultaneous LV/RV pressure recording   Normal study - no hemodynamic evidence for constrictive pericarditis    EKG:   EKG is personally reviewed. 05/30/2021: Sinus rhythm. Rate 98 bpm.  Recent Labs: 06/06/2021: TSH 0.876 07/13/2021: ALT 44; BUN 13; Creatinine, Ser 0.75; Platelets 322 07/22/2021: Hemoglobin 11.6; Potassium 3.7; Sodium 139   Recent Lipid Panel    Component Value Date/Time   CHOL 159 03/31/2021 1055   TRIG 96 03/31/2021 1055   HDL 63 03/31/2021 1055   CHOLHDL 2.5 03/31/2021 1055   LDLCALC 78 03/31/2021 1055    Physical Exam:    VS:  BP 130/75 (BP Location: Left Arm, Patient Position: Sitting, Cuff Size:  Large)   Pulse 94   Ht 5' 4"  (1.626 m)   Wt 239 lb 11.2 oz (108.7 kg)   SpO2 100%   BMI 41.14 kg/m  , BMI Body mass index is 41.14 kg/m. GENERAL:  Well appearing HEENT: Pupils equal round and reactive, fundi not visualized, oral mucosa unremarkable NECK:  No jugular venous distention, waveform within normal limits, carotid upstroke brisk and symmetric, no bruits, no thyromegaly LUNGS:  Clear to auscultation bilaterally HEART:  RRR.  PMI not displaced or sustained,S1 and S2 within normal limits, no S3, no S4, no clicks, no rubs, no murmurs ABD:  Flat, positive bowel sounds normal in frequency in pitch, no bruits, no rebound, no guarding, no midline pulsatile mass, no hepatomegaly, no splenomegaly EXT:  2 plus pulses throughout, no edema, no cyanosis no clubbing SKIN:  No rashes no nodules NEURO:  Cranial nerves II through XII grossly intact, motor grossly intact throughout PSYCH:  Cognitively intact, oriented to person place and time  ASSESSMENT:    1. OSA on CPAP   2. Class 3 severe obesity due to excess calories with serious comorbidity and body mass index (BMI) of 40.0 to 44.9 in adult ([place])   3. Pericardial calcification      PLAN:    Essential hypertension, benign Blood pressure is controlled today.  She is going to work on increasing her exercise.  Continue hydrochlorothiazide, amlodipine, and telmisartan.  OSA on CPAP Continue CPAP.  Class 3 severe obesity due to excess calories with serious comorbidity and body mass index (BMI) of 40.0 to 44.9 in adult [facility]) She is doing a good job of losing weight.  She lost 10 pounds in the last couple months.  Continue Ozempic.  We will also refer her to the PREP program at the [facility].  Pericardial calcification Noted on imaging.  Given her shortness of breath she did undergo left and right heart catheterization.  There was no evidence of constriction.  I do not think this is contributing to her symptoms.  She is currently undergoing a  work-up for rheumatological disease and it is possible this is related.  We will repeat her echo in a year to make sure it is stable.  Disposition: FU with Viviane Semidey C. Oval Linsey, MD, [facility] in 2 months.  Medication Adjustments/Labs and Tests Ordered: Current medicines are reviewed at length with the patient today.  Concerns regarding medicines are outlined above.   Orders Placed This Encounter  Procedures   Amb Referral To Provider Referral Exercise Program (P.R.E.P)  ECHOCARDIOGRAM COMPLETE   No orders of the defined types were placed in this encounter.  Patient Instructions  Medication Instructions:  Your physician recommends that you continue on your current medications as directed. Please refer to the Current Medication list given to you today.  *If you need a refill on your cardiac medications before your next appointment, please call your pharmacy*  Lab Work: NONE  Testing/Procedures: Your physician has requested that you have an echocardiogram. Echocardiography is a painless test that uses sound waves to create images of your heart. It provides your doctor with information about the size and shape of your heart and how well your heart's chambers and valves are working. This procedure takes approximately one hour. There are no restrictions for this procedure.  TO BE DONE APRIL 2024  Follow-Up: At [facility], you and your health needs are our priority.  As part of our continuing mission to provide you with exceptional heart care, we have created designated Provider Care Teams.  These Care Teams include your primary Cardiologist (physician) and Advanced Practice Providers (APPs -  Physician Assistants and Nurse Practitioners) who all work together to provide you with the care you need, when you need it.  We recommend signing up for the patient portal called "MyChart".  Sign up information is provided on this After Visit Summary.  MyChart is used to connect with patients for  Virtual Visits (Telemedicine).  Patients are able to view lab/test results, encounter notes, upcoming appointments, etc.  Non-urgent messages can be sent to your provider as well.   To learn more about what you can do with MyChart, go to [email or web address].    Your next appointment:    Provider:    AFTER ECHO IN APRIL 2024  [facility]ORA OR PAM FROM PREP PROGRAM WILL BE IN [place]         Signed, Skeet Latch, MD  07/27/2021 11:04 AM    [place]

## 2021-07-27 NOTE — Assessment & Plan Note (Signed)
She is doing a good job of losing weight.  She lost 10 pounds in the last couple months.  Continue Ozempic.  We will also refer her to the PREP program at the [facility].

## 2021-07-28 ENCOUNTER — Telehealth: Payer: Self-pay

## 2021-07-28 NOTE — Telephone Encounter (Signed)
Called to discuss PREP program referral; left voicemail. 

## 2021-07-29 ENCOUNTER — Telehealth: Payer: Self-pay

## 2021-07-29 ENCOUNTER — Other Ambulatory Visit: Payer: Self-pay | Admitting: Internal Medicine

## 2021-07-29 NOTE — Chronic Care Management (AMB) (Cosign Needed)
[employer] patient assistance program notification:  120- day supply of Ozempic '1mg'$   should arrive to the office in 10-14 business days. Patient has 1  refill remaining and enrollment will expire on 01/12/2022.  The next refill for patient will be fulfilled on 10/05/2021.  Pattricia Boss, [place] Pharmacist Assistant [phone]

## 2021-08-01 ENCOUNTER — Ambulatory Visit (HOSPITAL_BASED_OUTPATIENT_CLINIC_OR_DEPARTMENT_OTHER): Payer: Medicare Other | Admitting: Family

## 2021-08-01 ENCOUNTER — Other Ambulatory Visit: Payer: Self-pay | Admitting: Neurology

## 2021-08-01 DIAGNOSIS — R3915 Urgency of urination: Secondary | ICD-10-CM | POA: Diagnosis not present

## 2021-08-01 NOTE — Telephone Encounter (Signed)
Patient is changing pharmacies for trazodone. Patient is up to date on her appointments.

## 2021-08-02 ENCOUNTER — Telehealth: Payer: Medicare Other

## 2021-08-02 ENCOUNTER — Ambulatory Visit (INDEPENDENT_AMBULATORY_CARE_PROVIDER_SITE_OTHER): Payer: Medicare Other

## 2021-08-02 DIAGNOSIS — E782 Mixed hyperlipidemia: Secondary | ICD-10-CM

## 2021-08-02 DIAGNOSIS — E1165 Type 2 diabetes mellitus with hyperglycemia: Secondary | ICD-10-CM

## 2021-08-02 DIAGNOSIS — E039 Hypothyroidism, unspecified: Secondary | ICD-10-CM

## 2021-08-02 DIAGNOSIS — I10 Essential (primary) hypertension: Secondary | ICD-10-CM

## 2021-08-02 NOTE — Chronic Care Management (AMB) (Signed)
Chronic Care Management   CCM RN Visit Note  08/02/2021 Name: Michelle Hicks MRN: [id number] DOB: 29-Feb-1950  Subjective: Michelle Hicks is a 71 y.o. year old female who is a primary care patient of Glendale Chard, MD. The care management team was consulted for assistance with disease management and care coordination needs.    Engaged with patient by telephone for follow up visit in response to provider referral for case management and/or care coordination services.   Consent to Services:  The patient was given information about Chronic Care Management services, agreed to services, and gave verbal consent prior to initiation of services.  Please see initial visit note for detailed documentation.   Patient agreed to services and verbal consent obtained.   Assessment: Review of patient past medical history, allergies, medications, health status, including review of consultants reports, laboratory and other test data, was performed as part of comprehensive evaluation and provision of chronic care management services.   SDOH (Social Determinants of Health) assessments and interventions performed:  Yes, no acute changes   CCM Care Plan  Allergies  Allergen Reactions   Sulfa Antibiotics Rash   Sulfamethoxazole-Trimethoprim Rash    Outpatient Encounter Medications as of 08/02/2021  Medication Sig   Accu-Chek Softclix Lancets lancets Use as instructed to check blood sugars twice daily E11.69   amLODipine (NORVASC) 5 MG tablet Take 1 tablet (5 mg total) by mouth daily.   aspirin EC 81 MG tablet Take 81 mg by mouth daily. Some times   Blood Glucose Monitoring Suppl (ACCU-CHEK GUIDE) w/Device KIT Use to check blood sugars twice daily E11.69   Cholecalciferol (VITAMIN D-3 PO) Take 2,000 Units by mouth daily.    fluticasone (FLONASE) 50 MCG/ACT nasal spray Place 1 spray into both nostrils daily.   glucose blood (ACCU-CHEK GUIDE) test strip Use as instructed to check blood sugars twice daily  E11.69   hydrochlorothiazide (HYDRODIURIL) 25 MG tablet TAKE 1 TABLET BY MOUTH  DAILY   hydroxychloroquine (PLAQUENIL) 200 MG tablet Take 200 mg by mouth daily.   levothyroxine (SYNTHROID) 50 MCG tablet Take 50 mcg by mouth daily.    meloxicam (MOBIC) 15 MG tablet TAKE 1 TABLET BY MOUTH  DAILY AS NEEDED (Patient not taking: Reported on 07/13/2021)   Multiple Vitamin (MULTIVITAMIN) tablet Take 1 tablet by mouth daily.   omeprazole (PRILOSEC) 40 MG capsule TAKE 1 CAPSULE BY MOUTH  DAILY BEFORE A MEAL (Patient taking differently: Take 40 mg by mouth daily.)   predniSONE (DELTASONE) 10 MG tablet Take 10 mg by mouth 2 (two) times daily with a meal. Taper   Semaglutide, 1 MG/DOSE, (OZEMPIC, 1 MG/DOSE,) 2 MG/1.5ML SOPN Inject 1 mg into the skin once a week.   telmisartan (MICARDIS) 80 MG tablet TAKE 1 TABLET BY MOUTH  DAILY   traZODone (DESYREL) 50 MG tablet TAKE 1 TABLET BY MOUTH AT  BEDTIME AS NEEDED FOR SLEEP   Facility-Administered Encounter Medications as of 08/02/2021  Medication   sodium chloride flush (NS) 0.9 % injection 3 mL    Patient Active Problem List   Diagnosis Date Noted   Mixed hyperlipidemia 07/17/2021   Thyroid nodule 07/17/2021   Pulmonary nodule 07/17/2021   Myositis 07/17/2021   Pericardial calcification 07/12/2021   Tachycardia 05/30/2021   Exertional dyspnea 05/30/2021   OSA on CPAP 05/26/2021   GERD with apnea 05/26/2021   Attention deficit hyperactivity disorder, predominantly inattentive type 12/06/2020   History of anemia 12/06/2020   Hyperglycemia due to type 2 diabetes mellitus (  [place]) 12/06/2020   Hypothyroidism 12/06/2020   Menopausal symptom 12/06/2020   Non-toxic goiter 12/06/2020   Pure hypercholesterolemia 12/06/2020   Positive ANA (antinuclear antibody) 12/06/2020   Pain in left knee 12/06/2020   Chronic insomnia 04/26/2020   Snoring 04/26/2020   Class 3 severe obesity due to excess calories with serious comorbidity and body mass index (BMI) of 40.0  to 44.9 in adult [facility]) 03/18/2019   Essential hypertension, benign 11/13/2017   Gastroesophageal reflux disease without esophagitis 11/13/2017   Hypertension 09/04/2017   Type II diabetes mellitus, uncontrolled 09/04/2017   S/P abdominal hysterectomy and left salpingo-oophorectomy 08/18/1994    Conditions to be addressed/monitored: HTN, DM II, HLD, Primary hypothyroidism  Care Plan : RN Care Manager Plan of Care  Updates made by Lynne Logan, RN since 08/02/2021 12:00 AM     Problem: No plan of care established for management of chronic disease states (HTN, DM II, HLD)   Priority: High     Long-Range Goal: Development of plan of care for chronic disease states (HTN, DM II, HLD)   Start Date: 01/25/2021  Expected End Date: 01/25/2022  Recent Progress: On track  Priority: High  Note:   Current Barriers:  Knowledge Deficits related to plan of care for management of HTN, DM II, HLD  Chronic Disease Management support and education needs related to HTN, DM II, HLD   RNCM Clinical Goal(s):  Patient will verbalize basic understanding of  HTN, DM II, HLD disease process and self health management plan as evidenced by patient will report having no disease exacerbations related to her chronic disease states listed above take all medications exactly as prescribed and will call provider for medication related questions as evidenced by patient will report having no misses doses of her prescribed medications demonstrate Ongoing health management independence as evidenced by patient will report 100% adherence to her prescribed treatment plan  continue to work with RN Care Manager to address care management and care coordination needs related to  HTN, DM II, HLD as evidenced by adherence to CM Team Scheduled appointments demonstrate ongoing self health care management ability   as evidenced by    through collaboration with RN Care manager, provider, and care team.   Interventions: 1:1  collaboration with primary care provider regarding development and update of comprehensive plan of care as evidenced by provider attestation and co-signature Inter-disciplinary care team collaboration (see longitudinal plan of care) Evaluation of current treatment plan related to  self management and patient's adherence to plan as established by provider   Diabetes Interventions:  (Status:  Goal on track:  Yes.) Long Term Goal Assessed patient's understanding of A1c goal: <6.5% Provided education to patient about basic DM disease process Counseled on importance of regular laboratory monitoring as prescribed Advised patient, providing education and rationale, to check cbg daily before breakfast and at bedtime and record, calling PCP for findings outside established parameters Review of patient status, including review of consultants reports, relevant laboratory and other test results, and medications completed Reinforced the importance for ongoing adherence to dietary and exercise recommendations  Lab Results  Component Value Date   HGBA1C 7.1 (H) 07/13/2021   Hypertension Interventions:  (Status:  Goal on track:  Yes.) Long Term Goal Last practice recorded BP readings:  BP Readings from Last 3 Encounters:  07/27/21 130/75  07/22/21 (!) 106/52  07/13/21 122/70  Most recent eGFR/CrCl:  Lab Results  Component Value Date   EGFR 87 07/13/2021    No components  found for: "CRCL" Evaluation of current treatment plan related to hypertension self management and patient's adherence to plan as established by provider Reviewed and discussed the following Assessment/Plan per Dr. Oval Linsey, Cardiologist;  ASSESSMENT:     1. OSA on CPAP   2. Class 3 severe obesity due to excess calories with serious comorbidity and body mass index (BMI) of 40.0 to 44.9 in adult ([place])   3. Pericardial calcification    PLAN:   Essential hypertension, benign Blood pressure is controlled today.  She is going to work  on increasing her exercise.  Continue hydrochlorothiazide, amlodipine, and telmisartan. OSA on CPAP Continue CPAP. Class 3 severe obesity due to excess calories with serious comorbidity and body mass index (BMI) of 40.0 to 44.9 in adult [facility]) She is doing a good job of losing weight.  She lost 10 pounds in the last couple months.  Continue Ozempic.  We will also refer her to the PREP program at the [facility]. Pericardial calcification Noted on imaging.  Given her shortness of breath she did undergo left and right heart catheterization.  There was no evidence of constriction.  I do not think this is contributing to her symptoms.  She is currently undergoing a work-up for rheumatological disease and it is possible this is related.  We will repeat her echo in a year to make sure it is stable. Disposition: FU with Tiffany C. Oval Linsey, MD, [facility] in one year following her repeat echo.  Medication Adjustments/Labs and Tests Ordered: Current medicines are reviewed at length with the patient today.  Concerns regarding medicines are outlined above.    Orders Placed This Encounter  Procedures   Amb Referral To Provider Referral Exercise Program (P.R.E.P)   ECHOCARDIOGRAM COMPLETE   Determined patient verbalizes understanding of her prescribed treatment plan  Discussed plans with patient for ongoing care management follow up and provided patient with direct contact information for care management team  Elevated CK enzyme:  (Status:  Goal on track.)  Short Term Goal Evaluation of current treatment plan related to  Elevated CK enzyme , self-management and patient's adherence to plan as established by provider Review of patient status, including review of consultant's reports, relevant laboratory and other test results Reviewed and discussed patient follow up with Dr. Dossie Der, Rheumatologist for further evaluation of elevated CK enzymes and positive ANA Discussed plans with patient for ongoing care management follow  up and provided patient with direct contact information for care management team  Hyperlipidemia Interventions:  (Status:  Condition stable.  Not addressed this visit.) Long Term Goal Review of patient status, including review of consultant's reports, relevant laboratory and other test results, and medications completed Reviewed medications with patient and discussed importance of medication adherence, determined patient reported to PCP having myalgia secondary to taking her statin, reviewed PCP recommendations to discontinue Pravastatin Provider established cholesterol goals reviewed Counseled on importance of regular laboratory monitoring as prescribed Provided HLD educational materials Reviewed role and benefits of statin for ASCVD risk reduction Reviewed importance of limiting foods high in cholesterol Reviewed exercise goals and target of 150 minutes per week Educated on increased risk for heart disease due to having diabetes Encouraged patient to discuss alternative treatment options for HLD with Cardiology during initial visit to establish ongoing treatment management   Lipid Panel     Component Value Date/Time   CHOL 159 03/31/2021 1055   TRIG 96 03/31/2021 1055   HDL 63 03/31/2021 1055   CHOLHDL 2.5 03/31/2021 1055   LDLCALC 78  03/31/2021 1055   LABVLDL 18 03/31/2021 1055    Right Axilla abscess:  (Status:  Condition stable.  Not addressed this visit.)  Short Term Goal Evaluation of current treatment plan related to , self-management and patient's adherence to plan as established by provider Determined patient feels her right axilla abscess has resolved due to having no further drainage or tenderness, she completed full course of antibiotics Educated on signs/symptoms suggestive of reoccurring infection/abscess, educated on risk for delayed healing due to having diabetes, instructed patient to contact PCP if symptoms suggestive of reoccurring abscess occur  Discussed plans with  patient for ongoing care management follow up and provided patient with direct contact information for care management team  Pain Interventions:  (Status:  Condition stable.  Not addressed this visit.) Long Term Goal Pain assessment performed Medications reviewed Reviewed provider established plan for pain management Discussed importance of adherence to all scheduled medical appointments Counseled on the importance of reporting any/all new or changed pain symptoms or management strategies to pain management provider Advised patient to report to care team affect of pain on daily activities Discussed use of relaxation techniques and/or diversional activities to assist with pain reduction (distraction, imagery, relaxation, massage, acupressure, TENS, heat, and cold application Reviewed with patient prescribed pharmacological and nonpharmacological pain relief strategies Advised patient to discuss referral for PT with provider   Hypothyroidism Interventions:  (Status:  New goal.)  Long Term Goal Evaluation of current treatment plan related to  Hypothyroidism , self-management and patient's adherence to plan as established by provider Educated patient regarding basic disease process related to hypothyroidism Determined patient is being followed by Dr. Chalmers Cater for this condition Determined patient is experiencing difficulty swallowing, she reported her symptoms to Dr. Chalmers Cater, a thyroid US was recommended  Educated patient on signs/symptoms of thyroiditis including potential triggers and ways to manage this condition  Educated patient on Vitamin D deficiency secondary to thyroid disease Instructed patient to ask her doctor about checking her Vitamin D at next scheduled visit  Discussed patient is currently taking a maintenance dose of Vitamin D3 2000 iu daily  Reviewed scheduled/upcoming provider appointments including: Rheumatology follow up with Dr. Dossie Der, thyroid US, Endocrinology follow up with Dr.  Chalmers Cater   Discussed plans with patient for ongoing care management follow up and provided patient with direct contact information for care management team   Patient Goals/Self-Care Activities: Take all medications as prescribed Attend all scheduled provider appointments Call pharmacy for medication refills 3-7 days in advance of running out of medications Perform all self care activities independently  Perform IADL's (shopping, preparing meals, housekeeping, managing finances) independently Call provider office for new concerns or questions  drink 6 to 8 glasses of water each day eat fish at least once per week fill half of plate with vegetables manage portion size call doctor for signs and symptoms of high blood pressure keep all doctor appointments take medications for blood pressure exactly as prescribed report new symptoms to your doctor  Follow Up Plan:  Telephone follow up appointment with care management team member scheduled for:  09/27/21     Barb Merino, RN, BSN, CCM Care Management Coordinator [place] [facility]  Direct Phone: [phone]

## 2021-08-02 NOTE — Patient Instructions (Signed)
Visit Information  Thank you for taking time to visit with me today. Please don't hesitate to contact me if I can be of assistance to you before our next scheduled telephone appointment.  Following are the goals we discussed today:  (Copy and paste patient goals from clinical care plan here)  Our next appointment is by telephone on 09/27/21 at 10:30 AM   Please call the care guide team at [phone] if you need to cancel or reschedule your appointment.   If you are experiencing a Mental Health or [place] or need someone to talk to, please call 1-800-273-TALK (toll free, 24 hour hotline)   Patient verbalizes understanding of instructions and care plan provided today and agrees to view in [place]. Active MyChart status and patient understanding of how to access instructions and care plan via MyChart confirmed with patient.     Barb Merino, RN, BSN, CCM Care Management Coordinator [place] [facility]  Direct Phone: [phone]

## 2021-08-04 ENCOUNTER — Telehealth: Payer: Self-pay

## 2021-08-04 NOTE — Telephone Encounter (Signed)
Returned her call, explained PREP she prefers [facility], next July class; will ask [facility] to contact her with times/dates.

## 2021-08-08 ENCOUNTER — Ambulatory Visit
Admission: RE | Admit: 2021-08-08 | Discharge: 2021-08-08 | Disposition: A | Discharge status: Home / Self Care | Payer: Medicare Other | Source: Ambulatory Visit | Attending: Endocrinology | Admitting: Endocrinology

## 2021-08-08 ENCOUNTER — Telehealth: Payer: Self-pay

## 2021-08-08 DIAGNOSIS — E049 Nontoxic goiter, unspecified: Secondary | ICD-10-CM

## 2021-08-11 ENCOUNTER — Ambulatory Visit: Payer: Medicare Other | Admitting: Neurology

## 2021-08-11 DIAGNOSIS — E7800 Pure hypercholesterolemia, unspecified: Secondary | ICD-10-CM | POA: Diagnosis not present

## 2021-08-11 DIAGNOSIS — M609 Myositis, unspecified: Secondary | ICD-10-CM | POA: Diagnosis not present

## 2021-08-11 DIAGNOSIS — R768 Other specified abnormal immunological findings in serum: Secondary | ICD-10-CM | POA: Diagnosis not present

## 2021-08-11 DIAGNOSIS — M359 Systemic involvement of connective tissue, unspecified: Secondary | ICD-10-CM | POA: Diagnosis not present

## 2021-08-11 DIAGNOSIS — M25559 Pain in unspecified hip: Secondary | ICD-10-CM | POA: Diagnosis not present

## 2021-08-11 DIAGNOSIS — M549 Dorsalgia, unspecified: Secondary | ICD-10-CM | POA: Diagnosis not present

## 2021-08-11 NOTE — Progress Notes (Signed)
[facility] PREP Evaluation  Patient Details  Name: Michelle Hicks MRN: [id number] Date of Birth: [birth year] Age: 71 y.o. PCP: Glendale Chard, MD  Vitals:   08/10/21 1616  BP: 130/70  Pulse: 88  SpO2: 97%  Weight: 240 lb (108.9 kg)     [facility] Eval - 08/11/21 1600       Referral    Referring Provider Oval Linsey    Reason for referral Hypertension;Inactivity;Diabetes    Program Start Date 08/10/21      Measurement   Waist Circumference 49 inches    Hip Circumference 50 inches    Body fat 50 percent      Information for Trainer   Goals learn better nutrition, develop exercise habit, get wt under 200    Current Exercise Wii at home    Orthopedic Concerns Left shoulder stiff, left knee torn meniscus    Pertinent Medical History Joint pain/stiffness, HTN, DM, OSA, thyroid nodules    Current Barriers none   none   Restrictions/Precautions Diabetic snack before exercise    Medications that affect exercise Medication causing dizziness/drowsiness      Timed Up and Go (TUGS)   Timed Up and Go High risk >13 seconds   no falls in last 6 months. Gait altered     Mobility and Daily Activities   I find it easy to walk up or down two or more flights of stairs. 3    I have no trouble taking out the trash. 4    I do housework such as vacuuming and dusting on my own without difficulty. 4    I can easily lift a gallon of milk (8lbs). 4    I can easily walk a mile. 1    I have no trouble reaching into high cupboards or reaching down to pick up something from the floor. 4    I do not have trouble doing out-door work such as Armed forces logistics/support/administrative officer, raking leaves, or gardening. 3      Mobility and Daily Activities   I feel younger than my age. 3    I feel independent. 4    I feel energetic. 2    I live an active life.  1    I feel strong. 4    I feel healthy. 2    I feel active as other people my age. 1      How fit and strong are you.   Fit and Strong Total Score 40            Past Medical  History:  Diagnosis Date   Depression    DM (diabetes mellitus) ([place])    Exertional dyspnea 05/30/2021   HTN (hypertension)    Pericardial calcification 07/12/2021   Tachycardia 05/30/2021   Past Surgical History:  Procedure Laterality Date   ABDOMINAL HYSTERECTOMY     RIGHT/LEFT HEART CATH AND CORONARY ANGIOGRAPHY N/A 07/22/2021   Procedure: RIGHT/LEFT HEART CATH AND CORONARY ANGIOGRAPHY;  Surgeon: Sherren Mocha, MD;  Location: [place] [facility];  Service: Cardiovascular;  Laterality: N/A;   TONSILLECTOMY Bilateral 1967   Social History   Tobacco Use  Smoking Status Former   Packs/day: 0.50   Years: 15.00   Total pack years: 7.50   Types: Cigarettes   Quit date: 2002   Years since quitting: 21.5   Passive exposure: Past  Smokeless Tobacco Never    Barnett Hatter 08/11/2021, 4:21 PM

## 2021-08-12 DIAGNOSIS — E1159 Type 2 diabetes mellitus with other circulatory complications: Secondary | ICD-10-CM

## 2021-08-12 DIAGNOSIS — E785 Hyperlipidemia, unspecified: Secondary | ICD-10-CM

## 2021-08-12 DIAGNOSIS — E039 Hypothyroidism, unspecified: Secondary | ICD-10-CM | POA: Diagnosis not present

## 2021-08-12 DIAGNOSIS — I10 Essential (primary) hypertension: Secondary | ICD-10-CM | POA: Diagnosis not present

## 2021-08-18 ENCOUNTER — Ambulatory Visit (INDEPENDENT_AMBULATORY_CARE_PROVIDER_SITE_OTHER): Payer: Medicare Other | Admitting: Internal Medicine

## 2021-08-18 ENCOUNTER — Encounter: Payer: Self-pay | Admitting: Internal Medicine

## 2021-08-18 VITALS — BP 132/80 | HR 94 | Temp 98.6°F | Ht 64.0 in | Wt 239.8 lb

## 2021-08-18 DIAGNOSIS — D649 Anemia, unspecified: Secondary | ICD-10-CM

## 2021-08-18 DIAGNOSIS — M545 Low back pain, unspecified: Secondary | ICD-10-CM | POA: Diagnosis not present

## 2021-08-18 DIAGNOSIS — R109 Unspecified abdominal pain: Secondary | ICD-10-CM | POA: Diagnosis not present

## 2021-08-18 DIAGNOSIS — R8290 Unspecified abnormal findings in urine: Secondary | ICD-10-CM

## 2021-08-18 DIAGNOSIS — R1031 Right lower quadrant pain: Secondary | ICD-10-CM

## 2021-08-18 DIAGNOSIS — Z6841 Body Mass Index (BMI) 40.0 and over, adult: Secondary | ICD-10-CM

## 2021-08-18 LAB — POCT URINALYSIS DIPSTICK
Bilirubin, UA: NEGATIVE
Blood, UA: NEGATIVE
Glucose, UA: NEGATIVE
Ketones, UA: NEGATIVE
Nitrite, UA: NEGATIVE
Protein, UA: NEGATIVE
Spec Grav, UA: 1.02 (ref 1.010–1.025)
Urobilinogen, UA: 0.2 E.U./dL
pH, UA: 5.5 (ref 5.0–8.0)

## 2021-08-18 MED ORDER — TIZANIDINE HCL 4 MG PO TABS: 4.0000 mg | ORAL_TABLET | Freq: Every evening | ORAL | 1 refills | Status: DC | PRN

## 2021-08-18 NOTE — Progress Notes (Signed)
Rich Brave [place],acting as a Education administrator for [name], MD.,have documented all relevant documentation on the behalf of [name], MD,as directed by  [name], MD while in the presence of [name], MD.  This visit occurred during the SARS-CoV-2 public health emergency.  Safety protocols were in place, including screening questions prior to the visit, additional usage of staff PPE, and extensive cleaning of exam room while observing appropriate contact time as indicated for disinfecting solutions.  Subjective:     Patient ID: Michelle Hicks , female    DOB: 09-Feb-1951 , 71 y.o.   MRN: [id number]   Chief Complaint  Patient presents with   Abdominal Pain   Back Pain    HPI  Patient presents today for abdominal pain and back pain. She reports the pain started about a month or 2 ago. She reports the pain is worse now.  Described as a throbbing pain with occasional sharpness. She is unable to state what triggers her sx. Denies worsening constipation. Denies n/v.   Abdominal Pain This is a recurrent problem. The current episode started more than 1 month ago. The onset quality is gradual. The problem occurs intermittently.  Back Pain Associated symptoms include abdominal pain.     Past Medical History:  Diagnosis Date   Depression    DM (diabetes mellitus) ([place])    Exertional dyspnea 05/30/2021   HTN (hypertension)    Pericardial calcification 07/12/2021   Tachycardia 05/30/2021     Family History  Problem Relation Age of Onset   Diabetes Mother    Pancreatic cancer Mother    Hypertension Father    Heart attack Father 68     Current Outpatient Medications:    Accu-Chek Softclix Lancets lancets, Use as instructed to check blood sugars twice daily E11.69, Disp: 100 each, Rfl: 2   amLODipine (NORVASC) 5 MG tablet, Take 1 tablet (5 mg total) by mouth daily., Disp: 90 tablet, Rfl: 3   aspirin EC 81 MG tablet, Take 81 mg by mouth daily. Some times, Disp: ,  Rfl:    Blood Glucose Monitoring Suppl (ACCU-CHEK GUIDE) w/Device KIT, Use to check blood sugars twice daily E11.69, Disp: 1 kit, Rfl: 1   Cholecalciferol (VITAMIN D-3 PO), Take 2,000 Units by mouth daily. , Disp: , Rfl:    fluticasone (FLONASE) 50 MCG/ACT nasal spray, Place 1 spray into both nostrils daily., Disp: , Rfl:    glucose blood (ACCU-CHEK GUIDE) test strip, Use as instructed to check blood sugars twice daily E11.69, Disp: 100 each, Rfl: 2   hydrochlorothiazide (HYDRODIURIL) 25 MG tablet, TAKE 1 TABLET BY MOUTH  DAILY, Disp: 100 tablet, Rfl: 2   hydroxychloroquine (PLAQUENIL) 200 MG tablet, Take 200 mg by mouth daily., Disp: , Rfl:    levothyroxine (SYNTHROID) 50 MCG tablet, Take 50 mcg by mouth daily. , Disp: , Rfl:    Multiple Vitamin (MULTIVITAMIN) tablet, Take 1 tablet by mouth daily., Disp: , Rfl:    omeprazole (PRILOSEC) 40 MG capsule, TAKE 1 CAPSULE BY MOUTH  DAILY BEFORE A MEAL (Patient taking differently: Take 40 mg by mouth daily.), Disp: 90 capsule, Rfl: 2   predniSONE (DELTASONE) 10 MG tablet, Take 10 mg by mouth 2 (two) times daily with a meal. Taper, Disp: , Rfl:    Semaglutide, 1 MG/DOSE, (OZEMPIC, 1 MG/DOSE,) 2 MG/1.5ML SOPN, Inject 1 mg into the skin once a week., Disp: 4.5 mL, Rfl: 3   telmisartan (MICARDIS) 80 MG tablet, TAKE 1 TABLET  BY MOUTH  DAILY, Disp: 90 tablet, Rfl: 3   tiZANidine (ZANAFLEX) 4 MG tablet, Take 1 tablet (4 mg total) by mouth at bedtime as needed for muscle spasms., Disp: 30 tablet, Rfl: 1   traZODone (DESYREL) 50 MG tablet, TAKE 1 TABLET BY MOUTH AT  BEDTIME AS NEEDED FOR SLEEP, Disp: 90 tablet, Rfl: 0   meloxicam (MOBIC) 15 MG tablet, TAKE 1 TABLET BY MOUTH  DAILY AS NEEDED (Patient not taking: Reported on 07/13/2021), Disp: 90 tablet, Rfl: 1   penicillin v potassium (VEETID) 500 MG tablet, Take 1 tablet (500 mg total) by mouth in the morning and at bedtime., Disp: 10 tablet, Rfl: 0  Current Facility-Administered Medications:    sodium chloride  flush (NS) 0.9 % injection 3 mL, 3 mL, Intravenous, Q12H, Skeet Latch, MD   Allergies  Allergen Reactions   Sulfa Antibiotics Rash   Sulfamethoxazole-Trimethoprim Rash     Review of Systems  Constitutional: Negative.   Respiratory: Negative.    Cardiovascular: Negative.   Gastrointestinal:  Positive for abdominal pain.  Musculoskeletal:  Positive for back pain.  Neurological: Negative.   Psychiatric/Behavioral: Negative.       Today's Vitals   08/18/21 0848  BP: 132/80  Pulse: 94  Temp: 98.6 F (37 C)  Weight: 239 lb 12.8 oz (108.8 kg)  Height: 5' 4"  (1.626 m)  PainSc: 4   PainLoc: Back   Body mass index is 41.16 kg/m.  Wt Readings from Last 3 Encounters:  08/18/21 239 lb 12.8 oz (108.8 kg)  08/10/21 240 lb (108.9 kg)  07/27/21 239 lb 11.2 oz (108.7 kg)     Objective:  Physical Exam Vitals and nursing note reviewed.  Constitutional:      Appearance: Normal appearance. She is well-developed. She is obese.  HENT:     Head: Normocephalic and atraumatic.  Cardiovascular:     Rate and Rhythm: Normal rate and regular rhythm.     Heart sounds: Normal heart sounds.  Pulmonary:     Effort: Pulmonary effort is normal.     Breath sounds: Normal breath sounds.  Abdominal:     General: Bowel sounds are normal. There is no distension.     Palpations: Abdomen is soft.     Tenderness: There is no abdominal tenderness. There is right CVA tenderness.     Hernia: There is no hernia in the umbilical area.  Skin:    General: Skin is warm.  Neurological:     General: No focal deficit present.     Mental Status: She is alert.  Psychiatric:        Mood and Affect: Mood normal.        Behavior: Behavior normal.       Assessment And Plan:     1. Right flank pain Comments: I will refer her for Renal u/s. She agrees to treatment plan. I will also check urinalysis.  - POCT Urinalysis Dipstick (81002) - US RENAL; Future  2. Acute right-sided low back pain, unspecified  whether sciatica present Comments: She was given rx tizanidine to use nightly prn. She was also given some stretching exercises to perform daily.   3. Normocytic anemia Comments: I will check SPEP today. I will make further recommendations once her labs are available for relief.  - Protein electrophoresis, serum  4. Abnormal urine - Urine Culture  5. Class 3 severe obesity due to excess calories with serious comorbidity and body mass index (BMI) of 40.0 to 44.9 in adult [facility])  Comments: She is encouraged to incorporate more exercise into her daily routine.    Patient was given opportunity to ask questions. Patient verbalized understanding of the plan and was able to repeat key elements of the plan. All questions were answered to their satisfaction.   I, [name], MD, have reviewed all documentation for this visit. The documentation on 08/18/21 for the exam, diagnosis, procedures, and orders are all accurate and complete.   IF YOU HAVE BEEN REFERRED TO A SPECIALIST, IT MAY TAKE 1-2 WEEKS TO SCHEDULE/PROCESS THE REFERRAL. IF YOU HAVE NOT HEARD FROM US/SPECIALIST IN TWO WEEKS, PLEASE GIVE [place] A CALL AT [phone] X [phone].   THE PATIENT IS ENCOURAGED TO PRACTICE SOCIAL DISTANCING DUE TO THE COVID-19 PANDEMIC.

## 2021-08-18 NOTE — Patient Instructions (Signed)
Flank Pain, Adult ?Flank pain is pain in your side. The flank is the area on your side between your upper belly (abdomen) and your spine. The pain may occur over a short time (acute), or it may be long-term or come back often (chronic). It may be mild or very bad. Pain in this area can be caused by many different things. ?Follow these instructions at home: ? ?Drink enough fluid to keep your pee (urine) pale yellow. ?Rest as told by your doctor. ?Take over-the-counter and prescription medicines only as told by your doctor. ?Keep a journal to keep track of: ?What has caused your flank pain. ?What has made your flank pain feel better. ?Keep all follow-up visits. ?Contact a doctor if: ?Medicine does not help your pain. ?You have new symptoms. ?Your pain gets worse. ?Your symptoms last longer than 2-3 days. ?You have trouble peeing. ?You are peeing more often than normal. ?Get help right away if: ?You have trouble breathing. ?You are short of breath. ?Your belly hurts, or it is swollen or red. ?You feel like you may vomit (nauseous). ?You vomit. ?You feel faint, or you faint. ?You have blood in your pee. ?You have flank pain and a fever. ?These symptoms may be an emergency. Get help right away. Call your local emergency services (911 in the [place] ?Do not wait to see if the symptoms will go away. ?Do not drive yourself to the hospital. ?Summary ?Flank pain is pain in your side. The flank is the area of your side between your upper belly (abdomen) and your spine. ?Flank pain may occur over a short time (acute), or it may be long-term or come back often (chronic). It may be mild or very bad. ?Pain in this area can be caused by many different things. ?Contact your doctor if your symptoms get worse or last longer than 2-3 days. ?This information is not intended to replace advice given to you by your health care provider. Make sure you discuss any questions you have with your health care provider. ?Document Revised:  04/12/2020 Document Reviewed: 04/12/2020 ?Elsevier Patient Education ? 2023 Elsevier Inc. ? ?

## 2021-08-20 LAB — URINE CULTURE

## 2021-08-22 ENCOUNTER — Telehealth: Payer: Self-pay

## 2021-08-22 DIAGNOSIS — G629 Polyneuropathy, unspecified: Secondary | ICD-10-CM | POA: Diagnosis not present

## 2021-08-22 LAB — PROTEIN ELECTROPHORESIS, SERUM
A/G Ratio: 1.1 (ref 0.7–1.7)
Albumin ELP: 3.9 g/dL (ref 2.9–4.4)
Alpha 1: 0.2 g/dL (ref 0.0–0.4)
Alpha 2: 0.8 g/dL (ref 0.4–1.0)
Beta: 1.3 g/dL (ref 0.7–1.3)
Gamma Globulin: 1.3 g/dL (ref 0.4–1.8)
Globulin, Total: 3.7 g/dL (ref 2.2–3.9)
M-Spike, %: 0.4 g/dL — ABNORMAL HIGH
Total Protein: 7.6 g/dL (ref 6.0–8.5)

## 2021-08-22 NOTE — Chronic Care Management (AMB) (Addendum)
    Called Michelle Hicks, No answer, left message of appointment on 08-24-2021 at 2:00 via telephone visit with Orlando Penner, Pharm D. Notified to have all medications, supplements, blood pressure and/or blood sugar logs available during appointment and to return call if need to reschedule.  08-23-2021: Patient called back to reschedule 08-24-2021 visit with Orlando Penner due to appointment conflict. Patient was rescheduled to September.  [place] Pharmacist Assistant [phone]

## 2021-08-23 ENCOUNTER — Other Ambulatory Visit: Payer: Self-pay | Admitting: Internal Medicine

## 2021-08-23 ENCOUNTER — Other Ambulatory Visit: Payer: Medicare Other

## 2021-08-23 DIAGNOSIS — R778 Other specified abnormalities of plasma proteins: Secondary | ICD-10-CM

## 2021-08-23 NOTE — Progress Notes (Signed)
[facility] PREP Weekly Session  Patient Details  Name: Michelle Hicks MRN: [id number] Date of Birth: 1950-08-18 Age: 71 y.o. PCP: Glendale Chard, MD  There were no vitals filed for this visit.   [facility] Weekly seesion - 08/23/21 1700       [facility] "PREP" Location   [facility] "PREP" [facility]      Weekly Session   Topic Discussed Goal setting and welcome to the program   scale of perceived exertion   Classes attended to date [place] 08/23/2021, 5:33 PM

## 2021-08-24 ENCOUNTER — Telehealth: Payer: Medicare Other

## 2021-08-24 ENCOUNTER — Encounter: Payer: Self-pay | Admitting: Internal Medicine

## 2021-08-25 ENCOUNTER — Other Ambulatory Visit: Payer: Medicare Other

## 2021-08-25 ENCOUNTER — Other Ambulatory Visit: Payer: Self-pay

## 2021-08-25 ENCOUNTER — Encounter: Payer: Self-pay | Admitting: Internal Medicine

## 2021-08-25 DIAGNOSIS — R778 Other specified abnormalities of plasma proteins: Secondary | ICD-10-CM

## 2021-08-25 MED ORDER — PENICILLIN V POTASSIUM 500 MG PO TABS: 500.0000 mg | ORAL_TABLET | Freq: Two times a day (BID) | ORAL | 0 refills | Status: DC

## 2021-08-29 ENCOUNTER — Encounter: Payer: Self-pay | Admitting: Internal Medicine

## 2021-08-29 LAB — PE AND FLC, SERUM
A/G Ratio: 1.1 (ref 0.7–1.7)
Albumin ELP: 3.9 g/dL (ref 2.9–4.4)
Alpha 1: 0.2 g/dL (ref 0.0–0.4)
Alpha 2: 0.8 g/dL (ref 0.4–1.0)
Beta: 1.2 g/dL (ref 0.7–1.3)
Gamma Globulin: 1.3 g/dL (ref 0.4–1.8)
Globulin, Total: 3.5 g/dL (ref 2.2–3.9)
Ig Kappa Free Light Chain: 20.5 mg/L — ABNORMAL HIGH (ref 3.3–19.4)
Ig Lambda Free Light Chain: 10.5 mg/L (ref 5.7–26.3)
KAPPA/LAMBDA RATIO: 1.95 — ABNORMAL HIGH (ref 0.26–1.65)
M-Spike, %: 0.3 g/dL — ABNORMAL HIGH
Total Protein: 7.4 g/dL (ref 6.0–8.5)

## 2021-08-30 ENCOUNTER — Other Ambulatory Visit: Payer: Self-pay

## 2021-08-30 ENCOUNTER — Other Ambulatory Visit: Payer: Self-pay | Admitting: Internal Medicine

## 2021-08-30 ENCOUNTER — Ambulatory Visit
Admission: RE | Admit: 2021-08-30 | Discharge: 2021-08-30 | Disposition: A | Discharge status: Home / Self Care | Payer: Medicare Other | Source: Ambulatory Visit | Attending: Internal Medicine | Admitting: Internal Medicine

## 2021-08-30 ENCOUNTER — Encounter: Payer: Self-pay | Admitting: Internal Medicine

## 2021-08-30 ENCOUNTER — Telehealth: Payer: Self-pay | Admitting: Internal Medicine

## 2021-08-30 DIAGNOSIS — M545 Low back pain, unspecified: Secondary | ICD-10-CM | POA: Insufficient documentation

## 2021-08-30 DIAGNOSIS — R109 Unspecified abdominal pain: Secondary | ICD-10-CM

## 2021-08-30 DIAGNOSIS — D649 Anemia, unspecified: Secondary | ICD-10-CM | POA: Insufficient documentation

## 2021-08-30 DIAGNOSIS — N281 Cyst of kidney, acquired: Secondary | ICD-10-CM | POA: Diagnosis not present

## 2021-08-30 DIAGNOSIS — R778 Other specified abnormalities of plasma proteins: Secondary | ICD-10-CM

## 2021-08-30 NOTE — Telephone Encounter (Signed)
Scheduled appt per 7/18 referral. Pt is aware of appt date and time. Pt is aware to arrive 15 mins prior to appt time and to bring and updated insurance card. Pt is aware of appt location.   

## 2021-09-01 NOTE — Progress Notes (Signed)
[facility] PREP Weekly Session  Patient Details  Name: Michelle Hicks MRN: [id number] Date of Birth: August 01, 1950 Age: 71 y.o. PCP: Glendale Chard, MD  Vitals:   08/29/21 1430  Weight: 239 lb 11.2 oz (108.7 kg)     [facility] Weekly seesion - 09/01/21 1000       [facility] "PREP" Location   [facility] "PREP" Location [facility]      Weekly Session   Topic Discussed Importance of resistance training;Other ways to be active    Minutes exercised this week 35 minutes    Classes attended to date [place] 09/01/2021, 10:22 AM

## 2021-09-06 NOTE — Progress Notes (Signed)
[facility] PREP Weekly Session  Patient Details  Name: Michelle Hicks MRN: [id number] Date of Birth: 05/08/1950 Age: 71 y.o. PCP: Glendale Chard, MD  Vitals:   09/05/21 1430  Weight: 238 lb (108 kg)     [facility] Weekly seesion - 09/06/21 1700       [facility] "PREP" Location   [facility]      Weekly Session   Topic Discussed Healthy eating tips    Minutes exercised this week 135 minutes    Classes attended to date [place] 09/06/2021, 5:28 PM

## 2021-09-08 DIAGNOSIS — M359 Systemic involvement of connective tissue, unspecified: Secondary | ICD-10-CM | POA: Diagnosis not present

## 2021-09-08 DIAGNOSIS — R768 Other specified abnormal immunological findings in serum: Secondary | ICD-10-CM | POA: Diagnosis not present

## 2021-09-08 DIAGNOSIS — M549 Dorsalgia, unspecified: Secondary | ICD-10-CM | POA: Diagnosis not present

## 2021-09-08 DIAGNOSIS — M609 Myositis, unspecified: Secondary | ICD-10-CM | POA: Diagnosis not present

## 2021-09-14 NOTE — Progress Notes (Signed)
[facility] PREP Weekly Session  Patient Details  Name: Michelle Hicks MRN: [id number] Date of Birth: Oct 14, 1950 Age: 71 y.o. PCP: Glendale Chard, MD  Vitals:   09/12/21 1430  Weight: 238 lb (108 kg)     [facility] Weekly seesion - 09/14/21 1200       [facility] "PREP" Location   [facility]      Weekly Session   Topic Discussed Health habits    Minutes exercised this week 144 minutes    Classes attended to date [place] 09/14/2021, 12:21 PM

## 2021-09-15 ENCOUNTER — Telehealth: Payer: Self-pay

## 2021-09-15 NOTE — Chronic Care Management (AMB) (Signed)
Chronic Care Management Pharmacy Assistant   Name: Michelle Hicks  MRN: [id number] DOB: 05-13-1950  Reason for Encounter: Disease State/ Diabetes  Recent office visits:  08-18-2021 Glendale Chard, MD. Visit for abdominal pain. Small leukocytes UA. M-Spike, %= 0.4. Abnormal culture. US renal ordered.START tizanidine 4 mg at bedtime PRN.  08-02-2021 Little, Claudette Stapler, RN (CCM)  Recent consult visits:  09-12-2021 Germain Osgood, RN. Prep class.  09-08-2021 Sabino Niemann, MD (Rheumatology). Follow up visit.   09-06-2021 Germain Osgood, RN. Prep class.  08-29-2021 Germain Osgood, RN. Prep class.  08-22-2021 Germain Osgood, RN. Prep class.  08-11-2021 Sabino Niemann, MD (Rheumatology). Follow up visit.  08-10-2021 Germain Osgood, RN. Prep class.  07-27-2021 Skeet Latch, MD (Cardiology). Referral placed to Prep class. Echocardiogram complete ordered.  07-27-2021 Sherren Mocha, MD (Cardiology). RIGHT/LEFT HEART CATH AND CORONARY ANGIOGRAPHY procedure  Hospital visits:  None in previous 6 months  Medications: Outpatient Encounter Medications as of 09/15/2021  Medication Sig   Accu-Chek Softclix Lancets lancets Use as instructed to check blood sugars twice daily E11.69   amLODipine (NORVASC) 5 MG tablet Take 1 tablet (5 mg total) by mouth daily.   aspirin EC 81 MG tablet Take 81 mg by mouth daily. Some times   Blood Glucose Monitoring Suppl (ACCU-CHEK GUIDE) w/Device KIT Use to check blood sugars twice daily E11.69   Cholecalciferol (VITAMIN D-3 PO) Take 2,000 Units by mouth daily.    fluticasone (FLONASE) 50 MCG/ACT nasal spray Place 1 spray into both nostrils daily.   glucose blood (ACCU-CHEK GUIDE) test strip Use as instructed to check blood sugars twice daily E11.69   hydrochlorothiazide (HYDRODIURIL) 25 MG tablet TAKE 1 TABLET BY MOUTH  DAILY   hydroxychloroquine (PLAQUENIL) 200 MG tablet Take 200 mg by mouth daily.   levothyroxine (SYNTHROID) 50 MCG  tablet Take 50 mcg by mouth daily.    meloxicam (MOBIC) 15 MG tablet TAKE 1 TABLET BY MOUTH  DAILY AS NEEDED (Patient not taking: Reported on 07/13/2021)   Multiple Vitamin (MULTIVITAMIN) tablet Take 1 tablet by mouth daily.   omeprazole (PRILOSEC) 40 MG capsule TAKE 1 CAPSULE BY MOUTH  DAILY BEFORE A MEAL (Patient taking differently: Take 40 mg by mouth daily.)   penicillin v potassium (VEETID) 500 MG tablet Take 1 tablet (500 mg total) by mouth in the morning and at bedtime.   predniSONE (DELTASONE) 10 MG tablet Take 10 mg by mouth 2 (two) times daily with a meal. Taper   Semaglutide, 1 MG/DOSE, (OZEMPIC, 1 MG/DOSE,) 2 MG/1.5ML SOPN Inject 1 mg into the skin once a week.   telmisartan (MICARDIS) 80 MG tablet TAKE 1 TABLET BY MOUTH  DAILY   tiZANidine (ZANAFLEX) 4 MG tablet Take 1 tablet (4 mg total) by mouth at bedtime as needed for muscle spasms.   traZODone (DESYREL) 50 MG tablet TAKE 1 TABLET BY MOUTH AT  BEDTIME AS NEEDED FOR SLEEP   Facility-Administered Encounter Medications as of 09/15/2021  Medication   sodium chloride flush (NS) 0.9 % injection 3 mL  Recent Relevant Labs: Lab Results  Component Value Date/Time   HGBA1C 7.1 (H) 07/13/2021 10:45 AM   HGBA1C 7.3 (H) 03/31/2021 10:55 AM   HGBA1C 7.3 09/04/2017 12:00 AM   MICROALBUR 10 07/06/2020 12:47 PM   MICROALBUR 30 03/24/2020 10:27 AM    Kidney Function Lab Results  Component Value Date/Time   CREATININE 0.75 07/13/2021 10:45 AM   CREATININE 0.81 06/03/2021 08:01 AM   GFRNONAA 73  03/24/2020 10:34 AM   GFRAA 84 03/24/2020 10:34 AM    Current antihyperglycemic regimen:  Ozempic 1 mg weekly  What recent interventions/DTPs have been made to improve glycemic control:  Educated on Complications of diabetes including kidney damage, retinal damage, and cardiovascular disease; Exercise goal of 150 minutes per week; -Counseled to check feet dailA1c and blood sugar goals; Exercise goal of 150 minutes per week;y and get yearly eye  exams -Recommended to continue current medication  Have there been any recent hospitalizations or ED visits since last visit with CPP? No  Patient denies hypoglycemic symptoms  Patient denies hyperglycemic symptoms  How often are you checking your blood sugar? once daily  What are your blood sugars ranging?  Fasting: 08-03 104 Before meals: None After meals: 142 08-06, 08-05 217, 08-03 135 Bedtime: None  During the week, how often does your blood glucose drop below 70? Never  Are you checking your feet daily/regularly? Daily  Adherence Review: Is the patient currently on a STATIN medication? No Is the patient currently on ACE/ARB medication? Yes Does the patient have >5 day gap between last estimated fill dates? No  Care Gaps: Flu vaccine overdue AWV 04-13-2022  Star Rating Drugs: Telmisartan 80 mg- Last filled 07-29-2021 100 DS [facility] Ozempic 1 mg- Patient assistance    [place] Pharmacist Assistant [phone]

## 2021-09-19 ENCOUNTER — Other Ambulatory Visit: Payer: Self-pay | Admitting: Internal Medicine

## 2021-09-20 ENCOUNTER — Telehealth: Payer: Self-pay | Admitting: Internal Medicine

## 2021-09-20 NOTE — Progress Notes (Signed)
[facility] PREP Weekly Session  Patient Details  Name: Michelle Hicks MRN: [id number] Date of Birth: [birth year] Age: 71 y.o. PCP: Glendale Chard, MD  Vitals:   09/19/21 1430  Weight: 239 lb (108.4 kg)     [facility] Weekly seesion - 09/20/21 1700       [facility] "PREP" Location   [facility]PREP" [facility]      Weekly Session   Topic Discussed Restaurant Eating   Salt and sugar demo   Minutes exercised this week 180 minutes    Classes attended to date [place] 09/20/2021, 5:08 PM

## 2021-09-20 NOTE — Telephone Encounter (Signed)
Cancelled pt's new hem appt per 8/7 staff msg from Dr. Julien Nordmann. Spoke to pt and explained that appt is being cancelled per provider recommendation. Pt verbalized understanding. Will also send a msg to referring provider.

## 2021-09-21 ENCOUNTER — Encounter: Payer: Medicare Other | Admitting: Internal Medicine

## 2021-09-21 ENCOUNTER — Other Ambulatory Visit: Payer: Medicare Other

## 2021-09-23 DIAGNOSIS — E041 Nontoxic single thyroid nodule: Secondary | ICD-10-CM | POA: Diagnosis not present

## 2021-09-23 DIAGNOSIS — R350 Frequency of micturition: Secondary | ICD-10-CM | POA: Diagnosis not present

## 2021-09-26 DIAGNOSIS — R748 Abnormal levels of other serum enzymes: Secondary | ICD-10-CM | POA: Diagnosis not present

## 2021-09-27 ENCOUNTER — Ambulatory Visit: Payer: Self-pay

## 2021-09-27 NOTE — Patient Outreach (Signed)
  Care Coordination   Follow Up Visit Note   09/27/2021 Name: Michelle Hicks MRN: [id number] DOB: 04/12/1950  Michelle Hicks is a 71 y.o. year old female who sees Glendale Chard, MD for primary care. I spoke with  Michelle Hicks by phone today  What matters to the patients health and wellness today?  Patient would like to have her abnormal labs evaluated.     Goals Addressed       Patient Stated     I would like to have my abnormal labs evaluated (pt-stated)        Care Coordination Interventions: Review of patient status, including review of consultant's reports, relevant laboratory and other test results Counseled on importance of regular laboratory monitoring as directed by provider Assessed social determinant of health barriers  Determined PCP sent Hematology referral to Dr. Julien Nordmann Determined Dr. Julien Nordmann referred patient to her Rheumatologist for evaluation of abnormal SPEP Reviewed scheduled/upcoming provider appointments including: follow up with Dr. Dossie Der, Rheumatologist scheduled for 10/18/21 '@11'$ :58 AM Determined PCP is aware of hematology recommendation Instructed patient to notify her doctor of new symptoms or concerns           Component Ref Range & Units 1 mo ago (08/25/21) 1 mo ago (08/18/21) 2 mo ago (07/13/21) 6 mo ago (03/31/21) 9 mo ago (12/30/20) 1 yr ago (07/06/20) 1 yr ago (12/03/19)  Total Protein 6.0 - 8.5 g/dL 7.4  7.6  8.0  7.6  7.9  7.8  7.6   Albumin ELP 2.9 - 4.4 g/dL 3.9  3.9        Alpha 1 0.0 - 0.4 g/dL 0.2  0.2        Alpha 2 0.4 - 1.0 g/dL 0.8  0.8        Beta 0.7 - 1.3 g/dL 1.2  1.3        Gamma Globulin 0.4 - 1.8 g/dL 1.3  1.3        M-Spike, % Not Observed g/dL 0.3 High   0.4 High         Globulin, Total 2.2 - 3.9 g/dL 3.5  3.7  3.1 R  2.8 R  3.0 R  3.3 R  3.3 R   A/G Ratio 0.7 - 1.7 1.1  1.1        Please Note:  Comment  Comment CM        Comment: Protein electrophoresis scan will follow via computer, mail, or  courier delivery.   Ig Kappa  Free Light Chain 3.3 - 19.4 mg/L 20.5 High          Ig Lambda Free Light Chain 5.7 - 26.3 mg/L 10.5         KAPPA/LAMBDA RATIO 0.26 - 1.65 1.95 High                 SDOH assessments and interventions completed:  Yes     Care Coordination Interventions Activated:  Yes  Care Coordination Interventions:  Yes, provided   Follow up plan: Referral made to 10/20/21 '@11'$ :30 AM     Encounter Outcome:  Pt. Visit Completed

## 2021-09-27 NOTE — Progress Notes (Signed)
This encounter was created in error - please disregard.

## 2021-09-27 NOTE — Patient Instructions (Signed)
Visit Information  Thank you for taking time to visit with me today. Please don't hesitate to contact me if I can be of assistance to you.   Following are the goals we discussed today:   Goals Addressed       Patient Stated     I would like to have my abnormal labs evaluated (pt-stated)        Care Coordination Interventions: Review of patient status, including review of consultant's reports, relevant laboratory and other test results Counseled on importance of regular laboratory monitoring as directed by provider Assessed social determinant of health barriers  Determined PCP sent Hematology referral to Dr. Julien Nordmann Determined Dr. Julien Nordmann referred patient to her Rheumatologist for evaluation of abnormal SPEP Reviewed scheduled/upcoming provider appointments including: follow up with Dr. Dossie Der, Rheumatologist scheduled for 10/18/21 '@11'$ :43 AM Determined PCP is aware of hematology recommendation Instructed patient to notify her doctor of new symptoms or concerns           Component Ref Range & Units 1 mo ago (08/25/21) 1 mo ago (08/18/21) 2 mo ago (07/13/21) 6 mo ago (03/31/21) 9 mo ago (12/30/20) 1 yr ago (07/06/20) 1 yr ago (12/03/19)  Total Protein 6.0 - 8.5 g/dL 7.4  7.6  8.0  7.6  7.9  7.8  7.6   Albumin ELP 2.9 - 4.4 g/dL 3.9  3.9        Alpha 1 0.0 - 0.4 g/dL 0.2  0.2        Alpha 2 0.4 - 1.0 g/dL 0.8  0.8        Beta 0.7 - 1.3 g/dL 1.2  1.3        Gamma Globulin 0.4 - 1.8 g/dL 1.3  1.3        M-Spike, % Not Observed g/dL 0.3 High   0.4 High         Globulin, Total 2.2 - 3.9 g/dL 3.5  3.7  3.1 R  2.8 R  3.0 R  3.3 R  3.3 R   A/G Ratio 0.7 - 1.7 1.1  1.1        Please Note:  Comment  Comment CM        Comment: Protein electrophoresis scan will follow via computer, mail, or  courier delivery.   Ig Kappa Free Light Chain 3.3 - 19.4 mg/L 20.5 High          Ig Lambda Free Light Chain 5.7 - 26.3 mg/L 10.5         KAPPA/LAMBDA RATIO 0.26 - 1.65 1.95 High                 Our next  appointment is by telephone on 10/20/21 at 11:30 AM   Please call the care guide team at [phone] if you need to cancel or reschedule your appointment.   If you are experiencing a Mental Health or [place] or need someone to talk to, please call 1-800-273-TALK (toll free, 24 hour hotline)  Patient verbalizes understanding of instructions and care plan provided today and agrees to view in [place]. Active MyChart status and patient understanding of how to access instructions and care plan via MyChart confirmed with patient.     Barb Merino, RN, BSN, CCM Care Management Coordinator [facility] Care Management Direct Phone: [phone]

## 2021-10-10 DIAGNOSIS — G4733 Obstructive sleep apnea (adult) (pediatric): Secondary | ICD-10-CM | POA: Diagnosis not present

## 2021-10-12 DIAGNOSIS — R109 Unspecified abdominal pain: Secondary | ICD-10-CM | POA: Diagnosis not present

## 2021-10-18 ENCOUNTER — Telehealth: Payer: Self-pay

## 2021-10-18 ENCOUNTER — Encounter: Payer: Self-pay | Admitting: Internal Medicine

## 2021-10-18 DIAGNOSIS — R768 Other specified abnormal immunological findings in serum: Secondary | ICD-10-CM | POA: Diagnosis not present

## 2021-10-18 DIAGNOSIS — M549 Dorsalgia, unspecified: Secondary | ICD-10-CM | POA: Diagnosis not present

## 2021-10-18 DIAGNOSIS — M609 Myositis, unspecified: Secondary | ICD-10-CM | POA: Diagnosis not present

## 2021-10-18 DIAGNOSIS — M359 Systemic involvement of connective tissue, unspecified: Secondary | ICD-10-CM | POA: Diagnosis not present

## 2021-10-18 LAB — BASIC METABOLIC PANEL
BUN: 12 (ref 4–21)
CO2: 27 — AB (ref 13–22)
Chloride: 100 (ref 99–108)
Creatinine: 0.9 (ref 0.5–1.1)
Glucose: 90
Potassium: 3.9 mEq/L (ref 3.5–5.1)
Sodium: 27 — AB (ref 137–147)

## 2021-10-18 LAB — CBC: RBC: 45.1 — AB (ref 3.87–5.11)

## 2021-10-18 LAB — CBC AND DIFFERENTIAL
HCT: 37 (ref 36–46)
Hemoglobin: 12.1 (ref 12.0–16.0)
Platelets: 299 10*3/uL (ref 150–400)
WBC: 9.2

## 2021-10-18 LAB — COMPREHENSIVE METABOLIC PANEL
Albumin: 4.1 (ref 3.5–5.0)
Calcium: 9.8 (ref 8.7–10.7)
eGFR: 79

## 2021-10-18 LAB — HEPATIC FUNCTION PANEL
ALT: 43 U/L — AB (ref 7–35)
AST: 29 (ref 13–35)
Alkaline Phosphatase: 62 (ref 25–125)
Bilirubin, Total: 0.3

## 2021-10-18 NOTE — Chronic Care Management (AMB) (Signed)
[employer] patient assistance program notification:  120- day supply of Ozempic 1 mg will be filled on 10/14/2021 and should arrive to the office in 10-14 business days.    Pattricia Boss, [place] Pharmacist Assistant [phone]

## 2021-10-20 ENCOUNTER — Ambulatory Visit: Payer: Self-pay

## 2021-10-20 ENCOUNTER — Other Ambulatory Visit: Payer: Self-pay | Admitting: Internal Medicine

## 2021-10-20 DIAGNOSIS — Z1231 Encounter for screening mammogram for malignant neoplasm of breast: Secondary | ICD-10-CM

## 2021-10-20 NOTE — Patient Instructions (Signed)
Visit Information  Thank you for taking time to visit with me today. Please don't hesitate to contact me if I can be of assistance to you.   Following are the goals we discussed today:   Goals Addressed       Patient Stated     I have an infection that I am being treated for (pt-stated)        Care Coordination Interventions: Evaluation of current treatment plan related to UTI and patient's adherence to plan as established by provider Determined patient visited an urgent care for evaluation of symptoms suggestive of a UTI, her urine culture was positive for UTI, she is being treated with an antibiotic Instructed patient to increase her water intake to 48-64 oz daily and to complete her full course of antibiotics Instructed patient to ask her pharmacist about taking Azo cranberry tablets and or to drink diet cranberry juice to help with preventive for reoccurring UTI Instructed patient to report new symptoms or concerns to her doctor       I would like to have my abnormal labs evaluated (pt-stated)        Care Coordination Interventions: Review of patient status, including review of consultant's reports, relevant laboratory and other test results Determined patient followed up with Rheumatology on 10/18/21 for evaluation of her Myositis and abnormal labs Discussed Dr. Dossie Der does not believe her abnormal labs are related to her autoimmune connective tissue condition  Reviewed upcoming appointment with PCP Dr. Baird Cancer scheduled for 12/08/21 '@9'$ :9 AM Instructed patient to continue to follow her treatment recommendations per Dr. Dossie Der and to continue living a healthy lifestyle Instructed patient to report new symptoms or concerns to her doctor     Our next appointment is by telephone on 12/12/21 at [place] AM  Please call the care guide team at [phone] if you need to cancel or reschedule your appointment.   If you are experiencing a Mental Health or [place] or need someone to  talk to, please call 1-800-273-TALK (toll free, 24 hour hotline)  Patient verbalizes understanding of instructions and care plan provided today and agrees to view in [place]. Active MyChart status and patient understanding of how to access instructions and care plan via MyChart confirmed with patient.     Barb Merino, RN, BSN, CCM Care Management Coordinator [facility] Care Management Direct Phone: [phone]

## 2021-10-20 NOTE — Patient Outreach (Signed)
  Care Coordination   Follow Up Visit Note   10/20/2021 Name: Michelle Hicks MRN: [id number] DOB: 09/21/1950  Michelle Hicks is a 71 y.o. year old female who sees Glendale Chard, MD for primary care. I spoke with  Michelle Hicks by phone today.  What matters to the patients health and wellness today?  Patient would like to discuss her abnormal labs with her PCP. She is being treated for an infection.     Goals Addressed     Patient Stated     I have an infection that I am being treated for (pt-stated)        Care Coordination Interventions: Evaluation of current treatment plan related to UTI and patient's adherence to plan as established by provider Determined patient visited an urgent care for evaluation of symptoms suggestive of a UTI, her urine culture was positive for UTI, she is being treated with an antibiotic Instructed patient to increase her water intake to 48-64 oz daily and to complete her full course of antibiotics Instructed patient to ask her pharmacist about taking Azo cranberry tablets and or to drink diet cranberry juice to help with preventive for reoccurring UTI Instructed patient to report new symptoms or concerns to her doctor       I would like to have my abnormal labs evaluated (pt-stated)        Care Coordination Interventions: Review of patient status, including review of consultant's reports, relevant laboratory and other test results Determined patient followed up with Rheumatology on 10/18/21 for evaluation of her Myositis and abnormal labs Discussed Dr. Dossie Der does not believe her abnormal labs are related to her autoimmune connective tissue condition  Reviewed upcoming appointment with PCP Dr. Baird Cancer scheduled for 12/08/21 '@9'$ :52 AM Instructed patient to continue to follow her treatment recommendations per Dr. Dossie Der and to continue living a healthy lifestyle Instructed patient to report new symptoms or concerns to her doctor     SDOH assessments and  interventions completed:  No     Care Coordination Interventions Activated:  Yes  Care Coordination Interventions:  Yes, provided   Follow up plan: Follow up call scheduled for 12/12/21 '@0945'$  AM    Encounter Outcome:  Pt. Visit Completed

## 2021-10-23 ENCOUNTER — Other Ambulatory Visit: Payer: Self-pay | Admitting: Internal Medicine

## 2021-10-25 NOTE — Progress Notes (Signed)
[facility] PRE[facility] Weekly Session  Patient Details  Name: KIERRA JEZEWSKI MRN: [id number] Date of Birth: 1950/06/07 Age: 71 y.o. PCP: Glendale Chard, MD  Vitals:   10/24/21 1300  Weight: 240 lb (108.9 kg)     [facility] Weekly seesion - 10/25/21 1100       [facility] "PREP" Location   [facility]" [facility]      Weekly Session   Topic Discussed --   portions, label reading, importance of protein in weight loss and building muscle mass   Minutes exercised this week 128 minutes    Classes attended to date 17             Barnett Hatter 10/25/2021, 11:53 AM

## 2021-10-26 ENCOUNTER — Other Ambulatory Visit: Payer: Self-pay | Admitting: Neurology

## 2021-10-26 ENCOUNTER — Telehealth: Payer: Self-pay

## 2021-10-26 NOTE — Chronic Care Management (AMB) (Addendum)
[employer]ovo [employer]isk patient assistance program notification:  120- day supply of Ozempic 1 mg will be filled on 10/19/2021 and should arrive to the office in 10-14 business days. Patient has 0  refill remaining and enrollment will expire on 01/12/2022.  The next refill for patient will be fulfilled on 01/05/2022,  Reorder form filled out to receive refills before 2023 enrollment year.   Received provider signature and faxed reorder form.  Pattricia Boss, [place] Pharmacist Assistant [phone]

## 2021-10-27 ENCOUNTER — Telehealth: Payer: Self-pay

## 2021-10-27 NOTE — Chronic Care Management (AMB) (Signed)
    Called Michelle Hicks, No answer, left message of appointment on 10-28-2021 at 9:00 via telephone visit with Orlando Penner, Pharm D. Notified to have all medications, supplements, blood pressure and/or blood sugar logs available during appointment and to return call if need to reschedule.   Care Gaps:  Star Rating Drug: Telmisartan 80 mg- Last filled 10-07-2021 100 DS [facility] Ozempic 1 mg- Patient assistance   Any gaps in medications fill history? No  [place] Pharmacist Assistant [phone]

## 2021-10-28 ENCOUNTER — Ambulatory Visit (INDEPENDENT_AMBULATORY_CARE_PROVIDER_SITE_OTHER): Payer: Medicare Other

## 2021-10-28 DIAGNOSIS — E782 Mixed hyperlipidemia: Secondary | ICD-10-CM

## 2021-10-28 DIAGNOSIS — E1165 Type 2 diabetes mellitus with hyperglycemia: Secondary | ICD-10-CM

## 2021-10-28 NOTE — Progress Notes (Signed)
Chronic Care Management Pharmacy Note  11/07/2021 Name:  Michelle Hicks MRN:  [id number] DOB:  12-01-1950  Summary: Patient reports that she is doing well. She was worried that her BS was low.   Recommendations/Changes made from today's visit: Recommend adding statin to intolerance to patients chart.  Recommended having a quick acting glucose source on her person   Plan: Collaborate with PCP to place statin intolerance in patients chart.   Subjective: Michelle Hicks is an 71 y.o. year old female who is a primary patient of Glendale Chard, MD.  The CCM team was consulted for assistance with disease management and care coordination needs.    Engaged with patient by telephone for follow up visit in response to provider referral for pharmacy case management and/or care coordination services.   Consent to Services:  The patient was given information about Chronic Care Management services, agreed to services, and gave verbal consent prior to initiation of services.  Please see initial visit note for detailed documentation.   Patient Care Team: Glendale Chard, MD as PCP - General (Internal Medicine) Mayford Knife, [facility] (Pharmacist) Lynne Logan, RN as [place] Management  Recent office visits: 08/18/2021 PCP OV  Recent consult visits: 07/27/2021 Cardiology [facility] visits: None in previous 6 months   Objective: Lab Results  Component Value Date   CREATININE 0.75 07/13/2021   BUN 13 07/13/2021   EGFR 87 07/13/2021   GFRNONAA 73 03/24/2020   GFRAA 84 03/24/2020   NA 139 07/22/2021   K 3.7 07/22/2021   CALCIUM 10.0 07/13/2021   CO2 22 07/13/2021   GLUCOSE 106 (H) 07/13/2021    Lab Results  Component Value Date/Time   HGBA1C 7.1 (H) 07/13/2021 10:45 AM   HGBA1C 7.3 (H) 03/31/2021 10:55 AM   HGBA1C 7.3 09/04/2017 12:00 AM   MICROALBUR 10 07/06/2020 12:47 PM   MICROALBUR 30 03/24/2020 10:27 AM    Last diabetic Eye exam:  Lab Results   Component Value Date/Time   HMDIABEYEEXA No Retinopathy 01/11/2021 12:00 AM    Last diabetic Foot exam: No results found for: "HMDIABFOOTEX"   Lab Results  Component Value Date   CHOL 159 03/31/2021   HDL 63 03/31/2021   LDLCALC 78 03/31/2021   TRIG 96 03/31/2021   CHOLHDL 2.5 03/31/2021       Latest Ref Rng & Units 08/25/2021    8:50 AM 08/18/2021    9:56 AM 07/13/2021   10:45 AM  Hepatic Function  Total Protein 6.0 - 8.5 g/dL 7.4  7.6  8.0   Albumin 3.8 - 4.8 g/dL   4.9   AST 0 - 40 IU/L   34   ALT 0 - 32 IU/L   44   Alk Phosphatase 44 - 121 IU/L   81   Total Bilirubin 0.0 - 1.2 mg/dL   0.4     Lab Results  Component Value Date/Time   TSH 0.876 06/06/2021 03:32 PM   TSH 1.020 10/06/2020 11:17 AM   FREET4 1.18 06/06/2021 03:32 PM   FREET4 1.12 10/06/2020 11:17 AM       Latest Ref Rng & Units 07/22/2021    9:10 AM 07/22/2021    9:06 AM 07/13/2021   10:45 AM  CBC  WBC 3.4 - 10.8 x10E3/uL   9.2   Hemoglobin 12.0 - 15.0 g/dL 11.6  11.6    11.6  11.9   Hematocrit 36.0 - 46.0 % 34.0  34.0  34.0  35.2   Platelets 150 - 450 x10E3/uL   322     No results found for: "VD25OH"  Clinical ASCVD: No  The 10-year ASCVD risk score (Arnett DK, et al., 2019) is: 20.9%   Values used to calculate the score:     Age: 71 years     Sex: Female     Is Non-Hispanic African American: Yes     Diabetic: Yes     Tobacco smoker: No     Systolic Blood Pressure: 440 mmHg     Is BP treated: Yes     HDL Cholesterol: 63 mg/dL     Total Cholesterol: 159 mg/dL       03/31/2021    9:56 AM 03/24/2020   10:01 AM 07/10/2019   10:14 AM  Depression screen PHQ 2/9  Decreased Interest 0 0 0  Down, Depressed, Hopeless 0 0 0  PHQ - 2 Score 0 0 0  Altered sleeping   3  Tired, decreased energy   0  Change in appetite   0  Feeling bad or failure about yourself    0  Trouble concentrating   0  Moving slowly or fidgety/restless   0  Suicidal thoughts   0  PHQ-9 Score   3  Difficult doing  work/chores   Not difficult at all     Social History   Tobacco Use  Smoking Status Former   Packs/day: 0.50   Years: 15.00   Total pack years: 7.50   Types: Cigarettes   Quit date: 2002   Years since quitting: 21.7   Passive exposure: Past  Smokeless Tobacco Never   BP Readings from Last 3 Encounters:  11/02/21 132/60  08/18/21 132/80  08/10/21 130/70   Pulse Readings from Last 3 Encounters:  11/02/21 98  08/18/21 94  08/10/21 88   Wt Readings from Last 3 Encounters:  11/02/21 243 lb 9.6 oz (110.5 kg)  10/31/21 238 lb (108 kg)  10/24/21 240 lb (108.9 kg)   BMI Readings from Last 3 Encounters:  11/02/21 41.81 kg/m  10/31/21 40.85 kg/m  10/24/21 41.20 kg/m    Assessment/Interventions: Review of patient past medical history, allergies, medications, health status, including review of consultants reports, laboratory and other test data, was performed as part of comprehensive evaluation and provision of chronic care management services.   SDOH:  (Social Determinants of Health) assessments and interventions performed: Yes SDOH Interventions    Flowsheet Row Chronic Care Management from 08/18/2020 in [facility] Chronic Care Management from 08/06/2019 in [facility] Chronic Care Management from 07/25/2019 in [facility] Chronic Care Management from 07/24/2019 in [facility] Clinical Support from 07/10/2019 in [facility]  SDOH Interventions       Food Insecurity Interventions -- -- -- Intervention Not Indicated --  Housing Interventions -- -- -- Intervention Not Indicated --  Transportation Interventions -- -- -- Intervention Not Indicated --  Depression Interventions/Treatment  -- -- -- -- PHQ2-9 Score <4 Follow-up Not Indicated  Financial Strain Interventions Other (Comment)  [assistance completed for Ozempic] Other (Comment)  [Assisted patient with completing  patient portion of Ozempic PAP application] Other (Comment)  [Will help patient with patient assistance application for Ozempic] -- --      SDOH Screenings   Food Insecurity: No Food Insecurity (03/31/2021)  Housing: Low Risk  (07/24/2019)  Transportation Needs: No Transportation Needs (03/31/2021)  Depression (PHQ2-9): Low Risk  (03/31/2021)  Financial Resource Strain: High Risk (04/29/2021)  Physical Activity: Inactive (03/31/2021)  Stress: No Stress Concern Present (03/31/2021)  Tobacco Use: Medium Risk (11/02/2021)    CCM Care Plan  Allergies  Allergen Reactions   Sulfa Antibiotics Rash   Sulfamethoxazole-Trimethoprim Rash    Medications Reviewed Today     Reviewed by Glendale Chard, MD (Physician) on 11/02/21 at 1034  Med List Status: <None>   Medication Order Taking? Sig Documenting Provider Last Dose Status Informant  Accu-Chek Softclix Lancets lancets [id number] Yes Use as instructed to check blood sugars twice daily E11.69 Glendale Chard, MD Taking Active   amLODipine (NORVASC) 5 MG tablet [id number] Yes Take 1 tablet (5 mg total) by mouth daily. Skeet Latch, MD Taking Active Self  aspirin EC 81 MG tablet [id number] Yes Take 81 mg by mouth daily. Some times [provider] Taking Active Self  Blood Glucose Monitoring Suppl (ACCU-CHEK GUIDE) w/Device KIT [id number] Yes Use to check blood sugars twice daily E11.69 Glendale Chard, MD Taking Active   Cholecalciferol (VITAMIN D-3 PO) [id number] Yes Take 2,000 Units by mouth daily.  [provider] Taking Active Self  fluticasone (FLONASE) 50 MCG/ACT nasal spray [id number] Yes Place 1 spray into both nostrils daily. [provider] Taking Active Self  glucose blood (ACCU-CHEK GUIDE) test strip [id number] Yes Use as instructed to check blood sugars twice daily E11.69 Glendale Chard, MD Taking Active   hydrochlorothiazide (HYDRODIURIL) 25 MG tablet [id number] Yes TAKE 1 TABLET BY MOUTH  DAILY Glendale Chard, MD  Taking Active   hydroxychloroquine (PLAQUENIL) 200 MG tablet [id number] Yes Take 200 mg by mouth daily. [provider] Taking Active Self  levothyroxine (SYNTHROID) 50 MCG tablet [id number] Yes Take 50 mcg by mouth daily.  [provider] Taking Active Self  meloxicam (MOBIC) 15 MG tablet [id number] No TAKE 1 TABLET BY MOUTH  DAILY AS NEEDED  Patient not taking: Reported on 11/02/2021   Glendale Chard, MD Not Taking Active Self  Multiple Vitamin (MULTIVITAMIN) tablet [id number] Yes Take 1 tablet by mouth daily. [provider] Taking Active Self  omeprazole (PRILOSEC) 40 MG capsule [id number] Yes TAKE 1 CAPSULE BY MOUTH  DAILY BEFORE A MEAL  Patient taking differently: Take 40 mg by mouth daily.   Glendale Chard, MD Taking Active Self  predniSONE (DELTASONE) 10 MG tablet [id number] Yes Take 10 mg by mouth daily with breakfast. Taper [provider] Taking Active Self  Semaglutide, 1 MG/DOSE, (OZEMPIC, 1 MG/DOSE,) 2 MG/1.5ML SOPN [id number] Yes Inject 1 mg into the skin once a week. Glendale Chard, MD Taking Active Self  sodium chloride flush (NS) 0.9 % injection 3 mL [id number]   Skeet Latch, MD  Active   telmisartan (MICARDIS) 80 MG tablet [id number] Yes TAKE 1 TABLET BY MOUTH  DAILY Glendale Chard, MD Taking Active   tiZANidine (ZANAFLEX) 4 MG tablet [id number] No TAKE 1 TABLET(4 MG) BY MOUTH AT BEDTIME AS NEEDED FOR MUSCLE SPASMS  Patient not taking: Reported on 11/02/2021   Glendale Chard, MD Not Taking Active   traZODone (DESYREL) 50 MG tablet [id number] Yes TAKE 1 TABLET BY MOUTH AT  BEDTIME AS NEEDED FOR SLEEP Dohmeier, Asencion Partridge, MD Taking Active             Patient Active Problem List   Diagnosis Date Noted   Right flank pain 08/30/2021   Acute right-sided low back pain 08/30/2021   Normocytic anemia 08/30/2021   Mixed hyperlipidemia 07/17/2021   Thyroid nodule 07/17/2021   Pulmonary nodule 07/17/2021  Myositis 07/17/2021   Pericardial  calcification 07/12/2021   Tachycardia 05/30/2021   Exertional dyspnea 05/30/2021   OSA on CPAP 05/26/2021   GERD with apnea 05/26/2021   Attention deficit hyperactivity disorder, predominantly inattentive type 12/06/2020   History of anemia 12/06/2020   Hyperglycemia due to type 2 diabetes mellitus ([place]) 12/06/2020   Hypothyroidism 12/06/2020   Menopausal symptom 12/06/2020   Non-toxic goiter 12/06/2020   Pure hypercholesterolemia 12/06/2020   Positive ANA (antinuclear antibody) 12/06/2020   Pain in left knee 12/06/2020   Chronic insomnia 04/26/2020   Snoring 04/26/2020   Class 3 severe obesity due to excess calories with serious comorbidity and body mass index (BMI) of 40.0 to 44.9 in adult ([place]) 03/18/2019   Essential hypertension, benign 11/13/2017   Gastroesophageal reflux disease without esophagitis 11/13/2017   Hypertension 09/04/2017   Type II diabetes mellitus, uncontrolled 09/04/2017   S/P abdominal hysterectomy and left salpingo-oophorectomy 08/18/1994    Immunization History  Administered Date(s) Administered   DTaP 02/28/2012   Fluad Quad(high Dose 65+) 11/07/2019, 11/02/2021   Influenza, High Dose Seasonal PF 11/14/2017, 10/30/2018   Influenza-Unspecified 11/14/2017, 10/30/2018, 11/10/2020   Moderna Covid-19 Vaccine Bivalent Booster 42yr & up 11/10/2020   Moderna Sars-Covid-2 Vaccination 03/28/2019, 04/25/2019, 02/03/2020, 07/04/2020   Pneumococcal Conjugate-13 04/12/2018   Pneumococcal Polysaccharide-23 12/30/2020   Pneumococcal-Unspecified 08/06/2013   Zoster Recombinat (Shingrix) 11/14/2018, 07/29/2019    Conditions to be addressed/monitored:  Hyperlipidemia and Diabetes  Care Plan : [place] Updates made by PMayford Knife RManningsince 11/07/2021 12:00 AM     Problem: DM II, HLD   Priority: High     Long-Range Goal: Disease Management   Recent Progress: On track  Priority: High  Note:   Current Barriers:  Unable to independently  monitor therapeutic efficacy  Pharmacist Clinical Goal(s):  Patient will achieve adherence to monitoring guidelines and medication adherence to achieve therapeutic efficacy through collaboration with PharmD and provider.   Interventions: 1:1 collaboration with SGlendale Chard MD regarding development and update of comprehensive plan of care as evidenced by provider attestation and co-signature Inter-disciplinary care team collaboration (see longitudinal plan of care) Comprehensive medication review performed; medication list updated in electronic medical record  Hyperlipidemia: (LDL goal < 70) -Uncontrolled -Current treatment: Not currently taking any medication -Medications previously tried: Pravastatin  -Current dietary patterns: will discuss during next office visit  -Current exercise habits: [facility] at [facility] -Educated on Cholesterol goals;  -Collaborated with PCP to determine next steps for treatment of hyperlipidemia   Diabetes (A1c goal <7%) -Not ideally controlled -Current medications: Ozempic 1 mg once per week. Appropriate, Effective, Safe, Accessible -Current home glucose readings: she is checking her BS twice per day fasting glucose: 91 post prandial glucose: she has had some high BS readings 9/11-206, 9/10-190 -Denies hypoglycemic/hyperglycemic symptoms -Current meal patterns: will discuss during next office visit  -Current exercise: she is exercising[facility]  -Congratulate patient on drinking more water  -Educated on Exercise goal of 150 minutes per week; -Counseled to check feet daily and get yearly eye exams -Recommended to continue current medication  Patient Goals/Self-Care Activities Patient will:  - take medications as prescribed as evidenced by patient report and record review  Follow Up Plan: The patient has been provided with contact information for the care management team and  has been advised to call with any health related questions or concerns.  Medication Assistance: None required.  Patient affirms current coverage meets needs.  Compliance/Adherence/Medication fill history: Care Gaps: COVID-19 Vaccine  Star-Rating Drugs: Semaglutide 1 mg/ml Telmisartan 80 mg tablet  Patient's preferred pharmacy is:  [facility] [place], [address] AT [address] [place] [address] Phone: [phone] Fax: [phone]  [facility] - [place], [address] [place] [place] [address] Phone: [phone] Fax: [phone]  Uses pill box? Yes Pt endorses 90% compliance  We discussed: Benefits of medication synchronization, packaging and delivery as well as enhanced pharmacist oversight with [employer]. Patient decided to: Continue current medication management strategy  Care Plan and Follow Up Patient Decision:  Patient agrees to Care Plan and Follow-up.  Plan: The patient has been provided with contact information for the care management team and has been advised to call with any health related questions or concerns.   Orlando Penner, CPP, PharmD Clinical Pharmacist Practitioner [facility] [phone]

## 2021-11-01 NOTE — Patient Instructions (Addendum)
Visit Information It was great speaking with you today!  Please let me know if you have any questions about our visit.   Goals Addressed             This Visit's Progress    Manage My Medicine       Timeframe:  Long-Range Goal Priority:  High Start Date:                             Expected End Date:                       Follow Up Date will determine based on collaboration with patient and PCP   In Progress:   - call for medicine refill 2 or 3 days before it runs out - call if I am sick and can't take my medicine - keep a list of all the medicines I take; vitamins and herbals too - use an alarm clock or phone to remind me to take my medicine    Why is this important?   These steps will help you keep on track with your medicines.   Notes:  Please call if you have any questions         Patient Care Plan: CCM Pharmacy Care Plan     Problem Identified: DM II, HLD   Priority: High     Long-Range Goal: Disease Management   Recent Progress: On track  Priority: High  Note:   Current Barriers:  Unable to independently monitor therapeutic efficacy  Pharmacist Clinical Goal(s):  Patient will achieve adherence to monitoring guidelines and medication adherence to achieve therapeutic efficacy through collaboration with PharmD and provider.   Interventions: 1:1 collaboration with Glendale Chard, MD regarding development and update of comprehensive plan of care as evidenced by provider attestation and co-signature Inter-disciplinary care team collaboration (see longitudinal plan of care) Comprehensive medication review performed; medication list updated in electronic medical record  Hyperlipidemia: (LDL goal < 70) -Uncontrolled -Current treatment: Not currently taking any medication -Medications previously tried: Pravastatin  -Current dietary patterns: will discuss during next office visit  -Current exercise habits: [facility] at [facility] -Educated on Cholesterol goals;  -Collaborated with PCP to determine next steps for   Diabetes (A1c goal <7%) -Not ideally controlled -Current medications: Ozempic 1 mg once per week. Appropriate, Effective, Safe, Accessible -Current home glucose readings: she is checking her BS twice per day fasting glucose: 91 post prandial glucose: she has had some high BS readings 9/11-206, 9/10-190 -Denies hypoglycemic/hyperglycemic symptoms -Current meal patterns: will discuss during next office visit  -Current exercise: she is exercising[facility]  -Congratulate patient on drinking more water  -Educated on Exercise goal of 150 minutes per week; -Counseled to check feet daily and get yearly eye exams -Recommended to continue current medication  Patient Goals/Self-Care Activities Patient will:  - take medications as prescribed as evidenced by patient report and record review  Follow Up Plan: The patient has been provided with contact information for the care management team and has been advised to call with any health related questions or concerns.        Patient agreed to services and verbal consent obtained.   The patient verbalized understanding of instructions, educational materials, and care plan provided today and agreed to receive a mailed copy of patient  instructions, educational materials, and care plan.   Orlando Penner, PharmD Clinical Pharmacist [facility] [phone]

## 2021-11-02 ENCOUNTER — Encounter: Payer: Self-pay | Admitting: Internal Medicine

## 2021-11-02 ENCOUNTER — Ambulatory Visit (INDEPENDENT_AMBULATORY_CARE_PROVIDER_SITE_OTHER): Payer: Medicare Other | Admitting: Internal Medicine

## 2021-11-02 ENCOUNTER — Ambulatory Visit
Admission: RE | Admit: 2021-11-02 | Discharge: 2021-11-02 | Disposition: A | Discharge status: Home / Self Care | Payer: Medicare Other | Source: Ambulatory Visit | Attending: Internal Medicine | Admitting: Internal Medicine

## 2021-11-02 VITALS — BP 132/60 | HR 98 | Temp 98.1°F | Ht 64.0 in | Wt 243.6 lb

## 2021-11-02 DIAGNOSIS — G720 Drug-induced myopathy: Secondary | ICD-10-CM

## 2021-11-02 DIAGNOSIS — G8929 Other chronic pain: Secondary | ICD-10-CM | POA: Diagnosis not present

## 2021-11-02 DIAGNOSIS — E7800 Pure hypercholesterolemia, unspecified: Secondary | ICD-10-CM | POA: Diagnosis not present

## 2021-11-02 DIAGNOSIS — M609 Myositis, unspecified: Secondary | ICD-10-CM | POA: Diagnosis not present

## 2021-11-02 DIAGNOSIS — R778 Other specified abnormalities of plasma proteins: Secondary | ICD-10-CM | POA: Diagnosis not present

## 2021-11-02 DIAGNOSIS — Z23 Encounter for immunization: Secondary | ICD-10-CM | POA: Diagnosis not present

## 2021-11-02 DIAGNOSIS — M545 Low back pain, unspecified: Secondary | ICD-10-CM

## 2021-11-02 DIAGNOSIS — Z6841 Body Mass Index (BMI) 40.0 and over, adult: Secondary | ICD-10-CM

## 2021-11-02 DIAGNOSIS — M5136 Other intervertebral disc degeneration, lumbar region: Secondary | ICD-10-CM | POA: Diagnosis not present

## 2021-11-02 DIAGNOSIS — T466X5A Adverse effect of antihyperlipidemic and antiarteriosclerotic drugs, initial encounter: Secondary | ICD-10-CM

## 2021-11-02 DIAGNOSIS — M3320 Polymyositis, organ involvement unspecified: Secondary | ICD-10-CM

## 2021-11-02 MED ORDER — HYDROCODONE-ACETAMINOPHEN 5-325 MG PO TABS: 1.0000 | ORAL_TABLET | Freq: Four times a day (QID) | ORAL | 0 refills | Status: DC | PRN

## 2021-11-02 NOTE — Progress Notes (Signed)
[facility] PREP Weekly Session  Patient Details  Name: Michelle Hicks MRN: [id number] Date of Birth: 1950-12-29 Age: 71 y.o. PCP: Glendale Chard, MD  Vitals:   10/31/21 1430  Weight: 238 lb (108 kg)     [facility] Weekly seesion - 11/02/21 1300       [facility] "PREP" Location   [facility]      Weekly Session   Topic Discussed Finding support    Minutes exercised this week 90 minutes    Classes attended to date 17             [place] 11/02/2021, 1:27 PM

## 2021-11-02 NOTE — Progress Notes (Signed)
Michelle Hicks,acting as a Education administrator for Michelle Greenland, MD.,have documented all relevant documentation on the behalf of Michelle Greenland, MD,as directed by  Michelle Greenland, MD while in the presence of Michelle Greenland, MD.    Subjective:     Patient ID: Michelle Hicks , female    DOB: 04/11/1950 , 71 y.o.   MRN: [id number]   Chief Complaint  Patient presents with   Back Pain    HPI  Patient presents today for back pain that has worsened over the past couple of months. She is not sure what precipitated her pain, no falls/trauma. However, she did have a lot of muscle cramping when on chol meds. Unfortunately, her sx did not improve when taken off of the meds. She does state her mobility has improved since stopping the chol medication. Denies urinary/fecal incontinence. She has been eval by Rheum, unfortunately, prednisone did not improve her sx.   Back Pain This is a chronic problem. The current episode started more than 1 month ago. The problem has been gradually worsening since onset.     Past Medical History:  Diagnosis Date   Depression    DM (diabetes mellitus) ([place])    Exertional dyspnea 05/30/2021   HTN (hypertension)    Pericardial calcification 07/12/2021   Tachycardia 05/30/2021     Family History  Problem Relation Age of Onset   Diabetes Mother    Pancreatic cancer Mother    Hypertension Father    Heart attack Father 65     Current Outpatient Medications:    Accu-Chek Softclix Lancets lancets, Use as instructed to check blood sugars twice daily E11.69, Disp: 100 each, Rfl: 2   amLODipine (NORVASC) 5 MG tablet, Take 1 tablet (5 mg total) by mouth daily., Disp: 90 tablet, Rfl: 3   aspirin EC 81 MG tablet, Take 81 mg by mouth daily. Some times, Disp: , Rfl:    Blood Glucose Monitoring Suppl (ACCU-CHEK GUIDE) w/Device KIT, Use to check blood sugars twice daily E11.69, Disp: 1 kit, Rfl: 1   Cholecalciferol (VITAMIN D-3 PO), Take 2,000 Units by mouth daily. , Disp: , Rfl:     fluticasone (FLONASE) 50 MCG/ACT nasal spray, Place 1 spray into both nostrils daily., Disp: , Rfl:    glucose blood (ACCU-CHEK GUIDE) test strip, Use as instructed to check blood sugars twice daily E11.69, Disp: 100 each, Rfl: 2   hydrochlorothiazide (HYDRODIURIL) 25 MG tablet, TAKE 1 TABLET BY MOUTH  DAILY, Disp: 100 tablet, Rfl: 2   HYDROcodone-acetaminophen (NORCO/VICODIN) 5-325 MG tablet, Take 1 tablet by mouth every 6 (six) hours as needed for moderate pain., Disp: 20 tablet, Rfl: 0   hydroxychloroquine (PLAQUENIL) 200 MG tablet, Take 200 mg by mouth daily., Disp: , Rfl:    levothyroxine (SYNTHROID) 50 MCG tablet, Take 50 mcg by mouth daily. , Disp: , Rfl:    Multiple Vitamin (MULTIVITAMIN) tablet, Take 1 tablet by mouth daily., Disp: , Rfl:    omeprazole (PRILOSEC) 40 MG capsule, TAKE 1 CAPSULE BY MOUTH  DAILY BEFORE A MEAL (Patient taking differently: Take 40 mg by mouth daily.), Disp: 90 capsule, Rfl: 2   predniSONE (DELTASONE) 10 MG tablet, Take 10 mg by mouth daily with breakfast. Taper, Disp: , Rfl:    Semaglutide, 1 MG/DOSE, (OZEMPIC, 1 MG/DOSE,) 2 MG/1.5ML SOPN, Inject 1 mg into the skin once a week., Disp: 4.5 mL, Rfl: 3   telmisartan (MICARDIS) 80 MG tablet, TAKE 1 TABLET BY MOUTH  DAILY, Disp:  90 tablet, Rfl: 3   traZODone (DESYREL) 50 MG tablet, TAKE 1 TABLET BY MOUTH AT  BEDTIME AS NEEDED FOR SLEEP, Disp: 90 tablet, Rfl: 1   meloxicam (MOBIC) 15 MG tablet, TAKE 1 TABLET BY MOUTH  DAILY AS NEEDED (Patient not taking: Reported on 11/02/2021), Disp: 90 tablet, Rfl: 1   tiZANidine (ZANAFLEX) 4 MG tablet, TAKE 1 TABLET(4 MG) BY MOUTH AT BEDTIME AS NEEDED FOR MUSCLE SPASMS (Patient not taking: Reported on 11/02/2021), Disp: 30 tablet, Rfl: 1  Current Facility-Administered Medications:    sodium chloride flush (NS) 0.9 % injection 3 mL, 3 mL, Intravenous, Q12H, Skeet Latch, MD   Allergies  Allergen Reactions   Sulfa Antibiotics Rash   Sulfamethoxazole-Trimethoprim Rash      Review of Systems  Constitutional: Negative.   HENT: Negative.    Respiratory: Negative.    Cardiovascular: Negative.   Gastrointestinal: Negative.   Musculoskeletal:  Positive for back pain.  Neurological: Negative.   Psychiatric/Behavioral: Negative.       Today's Vitals   11/02/21 1008  BP: 132/60  Pulse: 98  Temp: 98.1 F (36.7 C)  Weight: 243 lb 9.6 oz (110.5 kg)  Height: 5' 4"  (1.626 m)  PainSc: 3   PainLoc: Back   Body mass index is 41.81 kg/m.  Wt Readings from Last 3 Encounters:  11/02/21 243 lb 9.6 oz (110.5 kg)  10/31/21 238 lb (108 kg)  10/24/21 240 lb (108.9 kg)     Objective:  Physical Exam Vitals and nursing note reviewed.  Constitutional:      Appearance: Normal appearance. She is obese.  HENT:     Head: Normocephalic and atraumatic.  Eyes:     Extraocular Movements: Extraocular movements intact.  Cardiovascular:     Rate and Rhythm: Regular rhythm.     Heart sounds: Normal heart sounds.  Pulmonary:     Effort: Pulmonary effort is normal.     Breath sounds: Normal breath sounds.  Musculoskeletal:        General: Tenderness present.     Cervical back: Normal range of motion.     Comments: No point tenderness  Skin:    General: Skin is warm.  Neurological:     General: No focal deficit present.     Mental Status: She is alert.  Psychiatric:        Mood and Affect: Mood normal.        Behavior: Behavior normal.      Assessment And Plan:     1. Chronic right-sided low back pain without sciatica Comments: She agrees to get lumbar films. I will make further recommendations once her results are available. She would likely benefit from Ortho eval.  - DG Lumbar Spine 2-3 Views; Future - Ambulatory referral to Orthopedic Surgery  2. Abnormal SPEP Comments: She agrees to come in for repeat M-spike. She has been referred to Oncology, referral was declined and it was suggested she see Rheum.  - Protein electrophoresis, serum; Future  3.  Pure hypercholesterolemia Comments: She is intolerant of statins. She agrees to eval by Lipid clinic for other optins.  - AMB Referral to [facility]  4. Statin myopathy Comments: She agrees to referral to Lipid clinic for Repatha evaluation.  - Ambulatory referral to Rheumatology - CK, total - AMB Referral to [facility]  5. Myositis, unspecified myositis type, unspecified site Comments: She would like second opinion regarding her sx.  - Ambulatory referral to Rheumatology  6. Class 3  severe obesity due to excess calories with serious comorbidity and body mass index (BMI) of 40.0 to 44.9 in adult [facility]) Comments: BMI 41. She is encouraged to avoid processed foods including packaged foods, sugary dirnks and processed meats.   7. Immunization due - Flu Vaccine QUAD High Dose(Fluad)   Patient was given opportunity to ask questions. Patient verbalized understanding of the plan and was able to repeat key elements of the plan. All questions were answered to their satisfaction.   I, Michelle Greenland, MD, have reviewed all documentation for this visit. The documentation on 11/02/21 for the exam, diagnosis, procedures, and orders are all accurate and complete.   IF YOU HAVE BEEN REFERRED TO A SPECIALIST, IT MAY TAKE 1-2 WEEKS TO SCHEDULE/PROCESS THE REFERRAL. IF YOU HAVE NOT HEARD FROM US/SPECIALIST IN TWO WEEKS, PLEASE GIVE [place] A CALL AT [phone].   THE PATIENT IS ENCOURAGED TO PRACTICE SOCIAL DISTANCING DUE TO THE COVID-19 PANDEMIC.

## 2021-11-03 ENCOUNTER — Telehealth: Payer: Self-pay

## 2021-11-03 LAB — CK: Total CK: 669 U/L (ref 32–182)

## 2021-11-03 NOTE — Chronic Care Management (AMB) (Signed)
11-03-2021: Left VM that ozempic is ready for pickup any day but Friday.   [place] Pharmacist Assistant [phone]

## 2021-11-04 ENCOUNTER — Encounter: Payer: Self-pay | Admitting: Internal Medicine

## 2021-11-08 NOTE — Progress Notes (Signed)
[facility] PREP Weekly Session  Patient Details  Name: Michelle Hicks MRN: [id number] Date of Birth: 30-Sep-1950 Age: 71 y.o. PCP: Glendale Chard, MD  Vitals:   11/07/21 1430  Weight: 241 lb (109.3 kg)     [facility] Weekly seesion - 11/08/21 1200       [facility] "PREP" Location   [facility]      Weekly Session   Topic Discussed --   carbs, proteins and fats   Minutes exercised this week 240 minutes    Classes attended to date 19             Barnett Hatter 11/08/2021, 12:43 PM

## 2021-11-11 ENCOUNTER — Ambulatory Visit: Payer: Medicare Other | Admitting: Orthopedic Surgery

## 2021-11-12 DIAGNOSIS — Z7985 Long-term (current) use of injectable non-insulin antidiabetic drugs: Secondary | ICD-10-CM

## 2021-11-12 DIAGNOSIS — E785 Hyperlipidemia, unspecified: Secondary | ICD-10-CM

## 2021-11-12 DIAGNOSIS — E1165 Type 2 diabetes mellitus with hyperglycemia: Secondary | ICD-10-CM | POA: Diagnosis not present

## 2021-11-21 ENCOUNTER — Other Ambulatory Visit: Payer: Medicare Other

## 2021-11-21 ENCOUNTER — Ambulatory Visit: Payer: Medicare Other

## 2021-11-21 ENCOUNTER — Ambulatory Visit: Payer: Medicare Other | Admitting: Internal Medicine

## 2021-11-21 DIAGNOSIS — R778 Other specified abnormalities of plasma proteins: Secondary | ICD-10-CM

## 2021-11-24 LAB — PROTEIN ELECTROPHORESIS, SERUM
A/G Ratio: 1.1 (ref 0.7–1.7)
Albumin ELP: 3.8 g/dL (ref 2.9–4.4)
Alpha 1: 0.3 g/dL (ref 0.0–0.4)
Alpha 2: 0.8 g/dL (ref 0.4–1.0)
Beta: 1.3 g/dL (ref 0.7–1.3)
Gamma Globulin: 1.2 g/dL (ref 0.4–1.8)
Globulin, Total: 3.6 g/dL (ref 2.2–3.9)
M-Spike, %: 0.3 g/dL — ABNORMAL HIGH
Total Protein: 7.4 g/dL (ref 6.0–8.5)

## 2021-11-24 NOTE — Progress Notes (Signed)
[facility] PREP Evaluation  Patient Details  Name: Michelle Hicks MRN: [id number] Date of Birth: [birth year] Age: 71 y.o. PCP: Glendale Chard, MD  Vitals:   11/23/21 1354  BP: (!) 118/58  Pulse: 98  SpO2: 99%  Weight: 242 lb 12.8 oz (110.1 kg)     [facility] Eval - 11/24/21 1300       [facility] "PREP" Location   [facility]PREP" Location [facility]      Referral    Referring Provider [employer] Start Date --   final class date 11/21/21     Measurement   Waist Circumference 50 inches    Hip Circumference 49.5 inches    Body fat 48.7 percent      Information for Trainer   Goals Encouraged to keep exercising      Mobility and Daily Activities   I find it easy to walk up or down two or more flights of stairs. 3    I have no trouble taking out the trash. 3    I do housework such as vacuuming and dusting on my own without difficulty. 4    I can easily lift a gallon of milk (8lbs). 3    I can easily walk a mile. 3    I have no trouble reaching into high cupboards or reaching down to pick up something from the floor. 2    I do not have trouble doing out-door work such as Armed forces logistics/support/administrative officer, raking leaves, or gardening. 2      Mobility and Daily Activities   I feel younger than my age. 3    I feel independent. 4    I feel energetic. 3    I live an active life.  2    I feel strong. 3    I feel healthy. 3    I feel active as other people my age. 3      How fit and strong are you.   Fit and Strong Total Score 41            Past Medical History:  Diagnosis Date   Depression    DM (diabetes mellitus) ([place])    Exertional dyspnea 05/30/2021   HTN (hypertension)    Pericardial calcification 07/12/2021   Tachycardia 05/30/2021   Past Surgical History:  Procedure Laterality Date   ABDOMINAL HYSTERECTOMY     RIGHT/LEFT HEART CATH AND CORONARY ANGIOGRAPHY N/A 07/22/2021   Procedure: RIGHT/LEFT HEART CATH AND CORONARY ANGIOGRAPHY;  Surgeon: Sherren Mocha, MD;  Location: [facility];  Service: Cardiovascular;  Laterality: N/A;   TONSILLECTOMY Bilateral 1967   Social History   Tobacco Use  Smoking Status Former   Packs/day: 0.50   Years: 15.00   Total pack years: 7.50   Types: Cigarettes   Quit date: 2002   Years since quitting: 21.7   Passive exposure: Past  Smokeless Tobacco Never  Attendance: > 21 workouts, 9 of 12 educational sessions  Cardio march: 225 to 277  Sit to stand: 15 to 13 Bicep curls: 13 to 18 Encouraged continued work on balance    [employer] 11/24/2021, 1:56 PM

## 2021-11-25 DIAGNOSIS — M609 Myositis, unspecified: Secondary | ICD-10-CM | POA: Diagnosis not present

## 2021-11-25 DIAGNOSIS — M359 Systemic involvement of connective tissue, unspecified: Secondary | ICD-10-CM | POA: Diagnosis not present

## 2021-11-25 DIAGNOSIS — R768 Other specified abnormal immunological findings in serum: Secondary | ICD-10-CM | POA: Diagnosis not present

## 2021-11-25 DIAGNOSIS — M549 Dorsalgia, unspecified: Secondary | ICD-10-CM | POA: Diagnosis not present

## 2021-12-06 ENCOUNTER — Ambulatory Visit (INDEPENDENT_AMBULATORY_CARE_PROVIDER_SITE_OTHER): Payer: Medicare Other | Admitting: Orthopaedic Surgery

## 2021-12-06 DIAGNOSIS — M48062 Spinal stenosis, lumbar region with neurogenic claudication: Secondary | ICD-10-CM

## 2021-12-06 DIAGNOSIS — M48061 Spinal stenosis, lumbar region without neurogenic claudication: Secondary | ICD-10-CM | POA: Insufficient documentation

## 2021-12-06 NOTE — Addendum Note (Signed)
Addended by: Meyer Cory on: 12/06/2021 09:30 AM   Modules accepted: Orders

## 2021-12-06 NOTE — Progress Notes (Signed)
Office Visit Note   Patient: Michelle Hicks           Date of Birth: Aug 05, 1950           MRN: [id number] Visit Date: 12/06/2021              Requested by: Glendale Chard, [address] [place],  [place] [address] PCP: Glendale Chard, MD   Assessment & Plan: Visit Diagnoses:  1. Spinal stenosis of lumbar region with neurogenic claudication     Plan: Patient has some degenerative anterolisthesis grade 1 L4-5 with intact pars.  BMI greater than 40 may be aggravating some of her symptoms.  She is working on a walking program and trying to work on weight loss.  We will set her up for some physical therapy recheck in 6 weeks.  We discussed degenerative anterolisthesis and likely some mild stenosis with mild claudication symptoms not severe enough to consider decompression/fusion at this point.  Hopefully should get some improvement with therapy recheck 6 weeks.  Follow-Up Instructions: No follow-ups on file.   Orders:  No orders of the defined types were placed in this encounter.  No orders of the defined types were placed in this encounter.     Procedures: No procedures performed   Clinical Data: No additional findings.   Subjective: Chief Complaint  Patient presents with   Lower Back - Pain    HPI 71 year old new patient visit with low back pain radiates to the right side.  She has increased pain with standing and can make it about a quarter to half mile has to stop sit and then can repeat.  Recently started methotrexate states she is better.  She has been on Ozempic for couple years with BMI greater than 40, type 2 diabetes.  No bowel bladder associated symptoms.  Relief with sitting.  Review of Systems all the systems noncontributory to HPI.   Objective: Vital Signs: BP 113/71   Pulse 96   Ht '5\' 3"'$  (1.6 m)   Wt 238 lb 14.4 oz (108.4 kg)   BMI 42.32 kg/m   Physical Exam Constitutional:      Appearance: She is well-developed.  HENT:     Head:  Normocephalic.     Right Ear: External ear normal.     Left Ear: External ear normal. There is no impacted cerumen.  Eyes:     Pupils: Pupils are equal, round, and reactive to light.  Neck:     Thyroid: No thyromegaly.     Trachea: No tracheal deviation.  Cardiovascular:     Rate and Rhythm: Normal rate.  Pulmonary:     Effort: Pulmonary effort is normal.  Abdominal:     Palpations: Abdomen is soft.  Musculoskeletal:     Cervical back: No rigidity.  Skin:    General: Skin is warm and dry.  Neurological:     Mental Status: She is alert and oriented to person, place, and time.  Psychiatric:        Behavior: Behavior normal.     Ortho Exam patient get from sitting standing walking across the exam room back-and-forth without limp anterior tib gastrocsoleus is strong.  Some tenderness lumbosacral junction radiates over the right SI joint slightly over the sciatic notch.  No trochanteric bursa tenderness.  Negative logroll the hips.  Specialty Comments:  No specialty comments available.  Imaging: Narrative & Impression  CLINICAL DATA:  Chronic low back pain.   EXAM: LUMBAR SPINE - 2-3  VIEW   COMPARISON:  None Available.   FINDINGS: Five non-rib-bearing lumbar vertebrae. Mild-to-moderate anterior and lateral spur formation throughout the lumbar and lower thoracic spine. Facet degenerative changes throughout the lumbar spine. Associated grade 1 anterolisthesis at the L4-5 level. No fractures or pars defects. Prominent stool throughout the colon.   IMPRESSION: Degenerative changes, as above.     Electronically Signed   By: Claudie Revering M.D.   On: 11/04/2021 09:42     PMFS History: Patient Active Problem List   Diagnosis Date Noted   Spinal stenosis of lumbar region 12/06/2021   Right flank pain 08/30/2021   Acute right-sided low back pain 08/30/2021   Normocytic anemia 08/30/2021   Mixed hyperlipidemia 07/17/2021   Thyroid nodule 07/17/2021   Pulmonary nodule  07/17/2021   Myositis 07/17/2021   Pericardial calcification 07/12/2021   Tachycardia 05/30/2021   Exertional dyspnea 05/30/2021   OSA on CPAP 05/26/2021   GERD with apnea 05/26/2021   Attention deficit hyperactivity disorder, predominantly inattentive type 12/06/2020   History of anemia 12/06/2020   Hyperglycemia due to type 2 diabetes mellitus ([place]) 12/06/2020   Hypothyroidism 12/06/2020   Menopausal symptom 12/06/2020   Non-toxic goiter 12/06/2020   Pure hypercholesterolemia 12/06/2020   Positive ANA (antinuclear antibody) 12/06/2020   Pain in left knee 12/06/2020   Chronic insomnia 04/26/2020   Snoring 04/26/2020   Class 3 severe obesity due to excess calories with serious comorbidity and body mass index (BMI) of 40.0 to 44.9 in adult ([place]) 03/18/2019   Essential hypertension, benign 11/13/2017   Gastroesophageal reflux disease without esophagitis 11/13/2017   Hypertension 09/04/2017   Type II diabetes mellitus, uncontrolled 09/04/2017   S/P abdominal hysterectomy and left salpingo-oophorectomy 08/18/1994   Past Medical History:  Diagnosis Date   Depression    DM (diabetes mellitus) ([place])    Exertional dyspnea 05/30/2021   HTN (hypertension)    Pericardial calcification 07/12/2021   Tachycardia 05/30/2021    Family History  Problem Relation Age of Onset   Diabetes Mother    Pancreatic cancer Mother    Hypertension Father    Heart attack Father 73    Past Surgical History:  Procedure Laterality Date   ABDOMINAL HYSTERECTOMY     RIGHT/LEFT HEART CATH AND CORONARY ANGIOGRAPHY N/A 07/22/2021   Procedure: RIGHT/LEFT HEART CATH AND CORONARY ANGIOGRAPHY;  Surgeon: Sherren Mocha, MD;  Location: [facility];  Service: Cardiovascular;  Laterality: N/A;   TONSILLECTOMY Bilateral 1967   Social History   Occupational History   Occupation: retired  Tobacco Use   Smoking status: Former    Packs/day: 0.50    Years: 15.00    Total pack years: 7.50    Types: Cigarettes     Quit date: 2002    Years since quitting: 21.8    Passive exposure: Past   Smokeless tobacco: Never  Vaping Use   Vaping Use: Never used  Substance and Sexual Activity   Alcohol use: Yes    Comment: socially    Drug use: No   Sexual activity: Not Currently    Partners: Male    Birth control/protection: Surgical

## 2021-12-08 ENCOUNTER — Ambulatory Visit (INDEPENDENT_AMBULATORY_CARE_PROVIDER_SITE_OTHER): Payer: Medicare Other | Admitting: Internal Medicine

## 2021-12-08 ENCOUNTER — Ambulatory Visit: Payer: Medicare Other | Attending: Cardiology | Admitting: Pharmacist

## 2021-12-08 ENCOUNTER — Encounter: Payer: Self-pay | Admitting: Internal Medicine

## 2021-12-08 VITALS — BP 112/70 | HR 93 | Temp 98.1°F | Ht 63.2 in | Wt 238.2 lb

## 2021-12-08 DIAGNOSIS — E7800 Pure hypercholesterolemia, unspecified: Secondary | ICD-10-CM

## 2021-12-08 DIAGNOSIS — E1169 Type 2 diabetes mellitus with other specified complication: Secondary | ICD-10-CM | POA: Diagnosis not present

## 2021-12-08 DIAGNOSIS — E041 Nontoxic single thyroid nodule: Secondary | ICD-10-CM

## 2021-12-08 DIAGNOSIS — M609 Myositis, unspecified: Secondary | ICD-10-CM

## 2021-12-08 DIAGNOSIS — I10 Essential (primary) hypertension: Secondary | ICD-10-CM

## 2021-12-08 DIAGNOSIS — E119 Type 2 diabetes mellitus without complications: Secondary | ICD-10-CM | POA: Insufficient documentation

## 2021-12-08 DIAGNOSIS — E785 Hyperlipidemia, unspecified: Secondary | ICD-10-CM | POA: Diagnosis not present

## 2021-12-08 DIAGNOSIS — Z6841 Body Mass Index (BMI) 40.0 and over, adult: Secondary | ICD-10-CM

## 2021-12-08 DIAGNOSIS — E1165 Type 2 diabetes mellitus with hyperglycemia: Secondary | ICD-10-CM | POA: Diagnosis not present

## 2021-12-08 NOTE — Progress Notes (Signed)
Patient ID: Michelle Hicks                 DOB: 02/12/1951                    MRN: [id number]     HPI: Michelle Hicks is a 71 y.o. female patient referred to lipid clinic by Robyn Sanders.  PMH is significant for HTN, OSA, GERD, T2DM, and obesity.  Lipids formerly better controlled on statins however patient's CK increased 8 months ago, statin d/c, yet CK remains elevated.  Patient presents today in good spirits. Had visit with Dr Sanders this morning. A1c and lipid panel repeated. Results not back yet.  CK levels: 2/16: 674 ( pravastatin d/c) 2/28: 795 4/24: 960 9/20: 669  Patient was started on prednisone and has been referred to rheumatology.  Last A1c 7.1. Pt on Ozempic 1mg weekly.  Dr [place] ordered cardiac CT in April 2023.  No evidence of coronary artery disease.  Updated lipid panel pending  Current Medications: N/A  Intolerances:  Statins?  Risk Factors:  T2DM  Labs: TC 228, 159, Trigs 96, HDL 63, LDL 78 (03/31/21 while on pravastatin)  Past Medical History:  Diagnosis Date   Depression    DM (diabetes mellitus) (HCC)    Exertional dyspnea 05/30/2021   HTN (hypertension)    Pericardial calcification 07/12/2021   Tachycardia 05/30/2021    Current Outpatient Medications on File Prior to Visit  Medication Sig Dispense Refill   Accu-Chek Softclix Lancets lancets Use as instructed to check blood sugars twice daily E11.69 100 each 2   amLODipine (NORVASC) 5 MG tablet Take 1 tablet (5 mg total) by mouth daily. 90 tablet 3   aspirin EC 81 MG tablet Take 81 mg by mouth daily. Some times     Blood Glucose Monitoring Suppl (ACCU-CHEK GUIDE) w/Device KIT Use to check blood sugars twice daily E11.69 1 kit 1   fluticasone (FLONASE) 50 MCG/ACT nasal spray Place 1 spray into both nostrils daily.     folic acid (FOLVITE) 1 MG tablet Take 1 mg by mouth daily.     glucose blood (ACCU-CHEK GUIDE) test strip Use as instructed to check blood sugars twice daily E11.69 100 each 2    hydrochlorothiazide (HYDRODIURIL) 25 MG tablet TAKE 1 TABLET BY MOUTH  DAILY 100 tablet 2   hydroxychloroquine (PLAQUENIL) 200 MG tablet Take 200 mg by mouth daily.     levothyroxine (SYNTHROID) 50 MCG tablet Take 50 mcg by mouth daily.      methotrexate (RHEUMATREX) 2.5 MG tablet Take 10 mg by mouth once a week.     Multiple Vitamin (MULTIVITAMIN) tablet Take 1 tablet by mouth daily.     omeprazole (PRILOSEC) 40 MG capsule TAKE 1 CAPSULE BY MOUTH  DAILY BEFORE A MEAL 90 capsule 2   predniSONE (DELTASONE) 10 MG tablet Take 10 mg by mouth daily with breakfast. Taper     Semaglutide, 1 MG/DOSE, (OZEMPIC, 1 MG/DOSE,) 2 MG/1.5ML SOPN Inject 1 mg into the skin once a week. 4.5 mL 3   telmisartan (MICARDIS) 80 MG tablet TAKE 1 TABLET BY MOUTH  DAILY 90 tablet 3   traZODone (DESYREL) 50 MG tablet TAKE 1 TABLET BY MOUTH AT  BEDTIME AS NEEDED FOR SLEEP 90 tablet 1   vortioxetine HBr (TRINTELLIX) 10 MG TABS tablet      Current Facility-Administered Medications on File Prior to Visit  Medication Dose Route Frequency Provider Last Rate Last Admin     sodium chloride flush (NS) 0.9 % injection 3 mL  3 mL Intravenous Q12H Skeet Latch, MD        Allergies  Allergen Reactions   Sulfa Antibiotics Rash   Sulfamethoxazole-Trimethoprim Rash    Assessment/Plan:  1. Hyperlipidemia - LDL  on 03/31/21: 78  Addendum after lipid panel updated.  LDL 121 on 12/08/21. Off of statins.   Patient LDL has increased to 121 off of statins. Updated ASCVD 10 yr risk score: 22.1%. Thankfully no evidence of CAD or other risk factors other than DM.  Will aim for LDL goal of <55 due to DM and elevated risk score despite no CAD evidence. Continue to avoid statins. Nexletol not metabolized in muscle tissue however will avoid as this will likely not reach goal. Awaiting rheumatology consult.  Next therapy options include PCSK9i or inclisiran. Insurance will provide better coverage for Repatha.  Using demo pen, educated  patient on mechanism of action, storage, site selection, administration, and possible adverse effects. 2.8% of patients in [facility] 3 trial experienced CK elevation on repatha. Will complete prior authorization and contact patient when approved. Recheck lipid panel and CK 2-3 months after starting medication. Will await results of rheumatology referral.  Continue to hold statins Start Repatha 17m q 2 weeks Recheck lipid/ total CK in 2-3 months  CKarren Cobble PharmD, [place] [address] [place] [address]Phone: [phone] Fax: [phone]

## 2021-12-08 NOTE — Patient Instructions (Signed)

## 2021-12-08 NOTE — Progress Notes (Signed)
Rich Brave [place]ittleton,acting as a Education administrator for [name], MD.,have documented all relevant documentation on the behalf of [name], MD,as directed by  [name], MD while in the presence of [name], MD.    Subjective:     Patient ID: Michelle Hicks , female    DOB: 04-Nov-1950 , 71 y.o.   MRN: [id number]   Chief Complaint  Patient presents with   Diabetes   Hypertension    HPI  The patient is here today for a follow-up on her diabetes and blood pressure.  She reports compliance with meds.  She denies headaches, chest pain and shortness of breath.    She is followed by Dr. Dossie Der, recently diagnosed with myositis. States she is now on MTX, she has had improvement in her sx, but they are not fully resolved. She is concerned about the possible side effects, wants to know how long she needs to be on the medication.   Diabetes She presents for her follow-up diabetic visit. She has type 2 diabetes mellitus. Her disease course has been stable. There are no hypoglycemic associated symptoms. There are no diabetic associated symptoms. Pertinent negatives for diabetes include no blurred vision, no polydipsia, no polyphagia and no polyuria. There are no hypoglycemic complications. Symptoms are stable. Risk factors for coronary artery disease include diabetes mellitus, obesity, sedentary lifestyle and post-menopausal. She is compliant with treatment most of the time. She is following a generally healthy diet. She participates in exercise intermittently. An ACE inhibitor/angiotensin II receptor blocker is being taken. Eye exam is current.  Hypertension This is a chronic problem. The current episode started more than 1 year ago. The problem has been gradually improving since onset. The problem is controlled. Pertinent negatives include no blurred vision. The current treatment provides moderate improvement.     Past Medical History:  Diagnosis Date   Depression    DM (diabetes  mellitus) ([place])    Exertional dyspnea 05/30/2021   HTN (hypertension)    Pericardial calcification 07/12/2021   Tachycardia 05/30/2021     Family History  Problem Relation Age of Onset   Diabetes Mother    Pancreatic cancer Mother    Hypertension Father    Heart attack Father 42     Current Outpatient Medications:    Accu-Chek Softclix Lancets lancets, Use as instructed to check blood sugars twice daily E11.69, Disp: 100 each, Rfl: 2   amLODipine (NORVASC) 5 MG tablet, Take 1 tablet (5 mg total) by mouth daily., Disp: 90 tablet, Rfl: 3   aspirin EC 81 MG tablet, Take 81 mg by mouth daily. Some times, Disp: , Rfl:    Blood Glucose Monitoring Suppl (ACCU-CHEK GUIDE) w/Device KIT, Use to check blood sugars twice daily E11.69, Disp: 1 kit, Rfl: 1   fluticasone (FLONASE) 50 MCG/ACT nasal spray, Place 1 spray into both nostrils daily., Disp: , Rfl:    folic acid (FOLVITE) 1 MG tablet, Take 1 mg by mouth daily., Disp: , Rfl:    glucose blood (ACCU-CHEK GUIDE) test strip, Use as instructed to check blood sugars twice daily E11.69, Disp: 100 each, Rfl: 2   hydrochlorothiazide (HYDRODIURIL) 25 MG tablet, TAKE 1 TABLET BY MOUTH  DAILY, Disp: 100 tablet, Rfl: 2   hydroxychloroquine (PLAQUENIL) 200 MG tablet, Take 200 mg by mouth daily., Disp: , Rfl:    levothyroxine (SYNTHROID) 50 MCG tablet, Take 50 mcg by mouth daily. , Disp: , Rfl:    methotrexate (RHEUMATREX) 2.5 MG tablet,  Take 10 mg by mouth once a week., Disp: , Rfl:    Multiple Vitamin (MULTIVITAMIN) tablet, Take 1 tablet by mouth daily., Disp: , Rfl:    omeprazole (PRILOSEC) 40 MG capsule, TAKE 1 CAPSULE BY MOUTH  DAILY BEFORE A MEAL, Disp: 90 capsule, Rfl: 2   predniSONE (DELTASONE) 10 MG tablet, Take 10 mg by mouth daily with breakfast. Taper, Disp: , Rfl:    Semaglutide, 1 MG/DOSE, (OZEMPIC, 1 MG/DOSE,) 2 MG/1.5ML SOPN, Inject 1 mg into the skin once a week., Disp: 4.5 mL, Rfl: 3   telmisartan (MICARDIS) 80 MG tablet, TAKE 1 TABLET BY  MOUTH  DAILY, Disp: 90 tablet, Rfl: 3   traZODone (DESYREL) 50 MG tablet, TAKE 1 TABLET BY MOUTH AT  BEDTIME AS NEEDED FOR SLEEP, Disp: 90 tablet, Rfl: 1  Current Facility-Administered Medications:    sodium chloride flush (NS) 0.9 % injection 3 mL, 3 mL, Intravenous, Q12H, Skeet Latch, MD   Allergies  Allergen Reactions   Statins     CK elevation   Sulfa Antibiotics Rash   Sulfamethoxazole-Trimethoprim Rash     Review of Systems  Constitutional: Negative.   Eyes: Negative.  Negative for blurred vision.  Respiratory: Negative.    Cardiovascular: Negative.   Gastrointestinal: Negative.   Endocrine: Negative for polydipsia, polyphagia and polyuria.  Musculoskeletal:  Positive for myalgias.  Skin: Negative.   Neurological: Negative.   Psychiatric/Behavioral: Negative.       Today's Vitals   12/08/21 0935  BP: 112/70  Pulse: 93  Temp: 98.1 F (36.7 C)  Weight: 238 lb 3.2 oz (108 kg)  Height: 5' 3.2" (1.605 m)  PainSc: 4   PainLoc: Back   Body mass index is 41.93 kg/m.  Wt Readings from Last 3 Encounters:  12/08/21 238 lb 3.2 oz (108 kg)  12/06/21 238 lb 14.4 oz (108.4 kg)  11/23/21 242 lb 12.8 oz (110.1 kg)     Objective:  Physical Exam Vitals and nursing note reviewed.  Constitutional:      Appearance: Normal appearance.  HENT:     Head: Normocephalic and atraumatic.     Nose:     Comments: Masked     Mouth/Throat:     Comments: Masked  Eyes:     Extraocular Movements: Extraocular movements intact.  Cardiovascular:     Rate and Rhythm: Normal rate and regular rhythm.     Heart sounds: Normal heart sounds.  Pulmonary:     Effort: Pulmonary effort is normal.     Breath sounds: Normal breath sounds.  Skin:    General: Skin is warm.  Neurological:     General: No focal deficit present.     Mental Status: She is alert.  Psychiatric:        Mood and Affect: Mood normal.        Behavior: Behavior normal.       Assessment And Plan:     1.  Dyslipidemia associated with type 2 diabetes mellitus ([place]) Comments: Chronic, I will check labs as below. Goal LDL <70, unfortunately she is unable to tolerate statins. Has upcoming appt w/ Lipid clinic.  - Lipid panel - Hemoglobin A1c  2. Myositis, unspecified myositis type, unspecified site Comments: Recent diagnosis, now on MTX. I do appreciate Rheum input, will request recent notes from Dr. Dossie Der.  - CMP14+EGFR; Future - CK, total; Future  3. Essential hypertension, benign Comments: Chronic, well controlled. She is encouraged to follow low sodium diet.   4. Thyroid nodule Comments:  June 2023 u/s reviewed, needs f/u of enlarging left thyroid nodule in June 2024. She is also followed by Endocrinology.  - US THYROID; Future  5. Class 3 severe obesity due to excess calories with serious comorbidity and body mass index (BMI) of 40.0 to 44.9 in adult [facility]) Comments: BMI 41. She is now exercising regularly at the [facility], she has lost 4 lbs in the past month. She is encouraged to keep up the great work.    Patient was given opportunity to ask questions. Patient verbalized understanding of the plan and was able to repeat key elements of the plan. All questions were answered to their satisfaction.   I, [name], MD, have reviewed all documentation for this visit. The documentation on 12/08/21 for the exam, diagnosis, procedures, and orders are all accurate and complete.   IF YOU HAVE BEEN REFERRED TO A SPECIALIST, IT MAY TAKE 1-2 WEEKS TO SCHEDULE/PROCESS THE REFERRAL. IF YOU HAVE NOT HEARD FROM US/SPECIALIST IN TWO WEEKS, PLEASE GIVE [place] A CALL AT [phone] X [phone].   THE PATIENT IS ENCOURAGED TO PRACTICE SOCIAL DISTANCING DUE TO THE COVID-19 PANDEMIC.

## 2021-12-08 NOTE — Patient Instructions (Signed)
It was nice meeting you today  We would like your LDL (bad cholesterol) to be less than 55  I will wait for your lab work to come back  The next treatment step is called Repatha which you would inject once every 2 weeks  I will complete the prior authorization for you and contact you when it is approved  If you start the medication we will recheck your cholesterol in about 2-3 months   Please let me know if you have any questions  Karren Cobble, PharmD, [place], [place], [address], [place], [place], [address] Phone: [phone], Fax: [phone]

## 2021-12-09 LAB — HEMOGLOBIN A1C
Est. average glucose Bld gHb Est-mCnc: 160 mg/dL
Hgb A1c MFr Bld: 7.2 % — ABNORMAL HIGH (ref 4.8–5.6)

## 2021-12-09 LAB — LIPID PANEL
Chol/HDL Ratio: 2.5 ratio (ref 0.0–4.4)
Cholesterol, Total: 228 mg/dL — ABNORMAL HIGH (ref 100–199)
HDL: 93 mg/dL (ref >39)
LDL Chol Calc (NIH): 121 mg/dL — ABNORMAL HIGH (ref 0–99)
Triglycerides: 81 mg/dL (ref 0–149)
VLDL Cholesterol Cal: 14 mg/dL (ref 5–40)

## 2021-12-12 ENCOUNTER — Ambulatory Visit: Payer: Self-pay

## 2021-12-12 NOTE — Patient Outreach (Signed)
  Care Coordination   Follow Up Visit Note   12/12/2021 Name: Michelle Hicks MRN: [id number] DOB: Nov 06, 1950  Michelle Hicks is a 71 y.o. year old female who sees Glendale Chard, MD for primary care. I spoke with  Michelle Hicks by phone today.  What matters to the patients health and wellness today?  Patient has a new Myositis diagnosis. She is taking a new medication to help manage this condition.     Goals Addressed               This Visit's Progress     Patient Stated     COMPLETED: I have an infection that I am being treated for (pt-stated)        Care Coordination Interventions: Evaluation of current treatment plan related to UTI and patient's adherence to plan as established by provider Symptoms have resolved and UTI has cleared      I would like to have my abnormal labs evaluated (pt-stated)        Care Coordination Interventions: Evaluation of current treatment plan related to Myositis and patient's adherence to plan as established by provider Determined patient received a diagnosis of Myositis per Dr. Dossie Der Review of patient status, including review of consultant's reports, relevant laboratory and other test results Reviewed medications with patient and discussed importance of medication adherence Educated patient regarding when to call her doctor if she develops symptoms of infection Educated patient regarding the importance of infection control and staying healthy overall by eating a well balanced meal, exercising on a regular basis, staying well hydrated and getting enough sleep to help avoid a myositis flare Instructed patient to report new symptoms or concerns to her doctor promptly         SDOH assessments and interventions completed:  No     Care Coordination Interventions Activated:  Yes  Care Coordination Interventions:  Yes, provided   Follow up plan: Follow up call scheduled for 02/14/22 '@0930'$  AM    Encounter Outcome:  Pt. Visit Completed

## 2021-12-12 NOTE — Patient Instructions (Signed)
Visit Information  Thank you for taking time to visit with me today. Please don't hesitate to contact me if I can be of assistance to you.   Following are the goals we discussed today:   Goals Addressed               This Visit's Progress     Patient Stated     COMPLETED: I have an infection that I am being treated for (pt-stated)        Care Coordination Interventions: Evaluation of current treatment plan related to UTI and patient's adherence to plan as established by provider Symptoms have resolved and UTI has cleared      I would like to have my abnormal labs evaluated (pt-stated)        Care Coordination Interventions: Evaluation of current treatment plan related to Myositis and patient's adherence to plan as established by provider Determined patient received a diagnosis of Myositis per Dr. Dossie Der Review of patient status, including review of consultant's reports, relevant laboratory and other test results Reviewed medications with patient and discussed importance of medication adherence Educated patient regarding when to call her doctor if she develops symptoms of infection Educated patient regarding the importance of infection control and staying healthy overall by eating a well balanced meal, exercising on a regular basis, staying well hydrated and getting enough sleep to help avoid a myositis flare Instructed patient to report new symptoms or concerns to her doctor promptly         Our next appointment is by telephone on 02/14/22 at 0930 AM  Please call the care guide team at [phone] if you need to cancel or reschedule your appointment.   If you are experiencing a Mental Health or [place] or need someone to talk to, please call 1-800-273-TALK (toll free, 24 hour hotline) go to [facility] [address] [place] [phone])  Patient verbalizes understanding of instructions and care plan provided today and  agrees to view in [place]. Active MyChart status and patient understanding of how to access instructions and care plan via MyChart confirmed with patient.     Barb Merino, RN, BSN, CCM Care Management Coordinator [facility] Care Management  Direct Phone: [phone]

## 2021-12-13 ENCOUNTER — Ambulatory Visit: Payer: Medicare Other | Admitting: Physical Therapy

## 2021-12-13 ENCOUNTER — Other Ambulatory Visit: Payer: Self-pay

## 2021-12-13 ENCOUNTER — Encounter: Payer: Self-pay | Admitting: Physical Therapy

## 2021-12-13 DIAGNOSIS — M6281 Muscle weakness (generalized): Secondary | ICD-10-CM | POA: Diagnosis not present

## 2021-12-13 DIAGNOSIS — R29898 Other symptoms and signs involving the musculoskeletal system: Secondary | ICD-10-CM | POA: Diagnosis not present

## 2021-12-13 DIAGNOSIS — M5459 Other low back pain: Secondary | ICD-10-CM

## 2021-12-13 NOTE — Therapy (Addendum)
OUTPATIENT PHYSICAL THERAPY THORACOLUMBAR EVALUATION DISCHARGE SUMMARY   Patient Name: Michelle Hicks MRN: [id number] DOB:October 08, 1950, 71 y.o., female Today's Date: 12/13/2021   PT End of Session - 12/13/21 1157     Visit Number 1    Number of Visits 8    Date for PT Re-Evaluation 02/07/22    Authorization Type [employer] Medicare $20 copay    Progress Note Due on Visit 10    PT Start Time 1104    PT Stop Time 1140    PT Time Calculation (min) 36 min    Activity Tolerance Patient tolerated treatment well    Behavior During Therapy [facility] for tasks assessed/performed             Past Medical History:  Diagnosis Date   Depression    DM (diabetes mellitus) ([place])    Exertional dyspnea 05/30/2021   HTN (hypertension)    Pericardial calcification 07/12/2021   Tachycardia 05/30/2021   Past Surgical History:  Procedure Laterality Date   ABDOMINAL HYSTERECTOMY     RIGHT/LEFT HEART CATH AND CORONARY ANGIOGRAPHY N/A 07/22/2021   Procedure: RIGHT/LEFT HEART CATH AND CORONARY ANGIOGRAPHY;  Surgeon: Sherren Mocha, MD;  Location: [place] [facility];  Service: Cardiovascular;  Laterality: N/A;   TONSILLECTOMY Bilateral 1967   Patient Active Problem List   Diagnosis Date Noted   Diabetes mellitus ([place]) 12/08/2021   Spinal stenosis of lumbar region 12/06/2021   Right flank pain 08/30/2021   Acute right-sided low back pain 08/30/2021   Normocytic anemia 08/30/2021   Mixed hyperlipidemia 07/17/2021   Thyroid nodule 07/17/2021   Pulmonary nodule 07/17/2021   Myositis 07/17/2021   Pericardial calcification 07/12/2021   Tachycardia 05/30/2021   Exertional dyspnea 05/30/2021   OSA on CPAP 05/26/2021   GERD with apnea 05/26/2021   Attention deficit hyperactivity disorder, predominantly inattentive type 12/06/2020   History of anemia 12/06/2020   Hyperglycemia due to type 2 diabetes mellitus ([place]) 12/06/2020   Hypothyroidism 12/06/2020   Menopausal symptom 12/06/2020   Non-toxic goiter  12/06/2020   Pure hypercholesterolemia 12/06/2020   Positive ANA (antinuclear antibody) 12/06/2020   Pain in left knee 12/06/2020   Chronic insomnia 04/26/2020   Snoring 04/26/2020   Class 3 severe obesity due to excess calories with serious comorbidity and body mass index (BMI) of 40.0 to 44.9 in adult [facility]) 03/18/2019   Essential hypertension, benign 11/13/2017   Gastroesophageal reflux disease without esophagitis 11/13/2017   Hypertension 09/04/2017   Type II diabetes mellitus, uncontrolled 09/04/2017   S/P abdominal hysterectomy and left salpingo-oophorectomy 08/18/1994    PCP: Glendale Chard, MD  REFERRING PROVIDER: Marybelle Killings, MD   REFERRING DIAG: [phone] (ICD-10-CM) - Spinal stenosis of lumbar region with neurogenic claudication   Rationale for Evaluation and Treatment: Rehabilitation  THERAPY DIAG:  Other low back pain - Plan: PT plan of care cert/re-cert  Muscle weakness (generalized) - Plan: PT plan of care cert/re-cert  Other symptoms and signs involving the musculoskeletal system - Plan: PT plan of care cert/re-cert  ONSET DATE: 6 months ago  SUBJECTIVE:  SUBJECTIVE STATEMENT: Pt is a 71 year old female with low back pain radiates to the right side x about 6 months without known injury.  Pain wraps around Rt side abdomen.  PERTINENT HISTORY:  Depression, DM, HTN, myositis  PAIN:  Are you having pain? Yes: NPRS scale: 4 currently, up to 7-8, at best 0-1/10 Pain location: Rt low back, around to abdomen Pain description: was sharp (none recently), dull, radiating Aggravating factors: twisting, increased activity in general Relieving factors: tylenol  PRECAUTIONS: None  WEIGHT BEARING RESTRICTIONS: No  FALLS:  Has patient fallen in last 6 months? 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)  PLOF: Independent and Leisure: [employer] 2-3 days/wk (seated cardio, upper body)  PATIENT GOALS: improve pain, improve walking distance   OBJECTIVE:   DIAGNOSTIC FINDINGS:  degenerative anterolisthesis grade 1 L4-5 with intact pars   PATIENT SURVEYS:  12/13/21: FOTO 50 (predicted 60)  SCREENING FOR RED FLAGS: Bowel or bladder incontinence: No Spinal tumors: No  COGNITION: Overall cognitive status: Within functional limits for tasks assessed     SENSATION: WFL  MUSCLE LENGTH: Hamstrings: Tightness Rt > Lt  POSTURE: rounded shoulders and forward head  LUMBAR ROM:   AROM eval  Flexion Limited 25%  Extension WNL  Right lateral flexion WNL  Left lateral flexion WNL  Right rotation WNL  Left rotation WNL   (Blank rows = not tested)  LOWER EXTREMITY MMT:    MMT Right eval Left eval  Hip flexion 3/5 4/5  Hip extension 3-/5 3/5  Hip abduction 3/5 4/5  Hip adduction    Hip internal rotation    Hip external rotation    Knee flexion 4/5 4/5  Knee extension 5/5 5/5  Ankle dorsiflexion 5/5 5/5  Ankle plantarflexion    Ankle inversion    Ankle eversion     (Blank rows = not tested)  GAIT: Distance walked: 100' Assistive device utilized: None Level of assistance: Complete Independence Comments: decreased speed, no significant abnormalities noted  TODAY'S TREATMENT:                                                                                                                              DATE: 12/13/21 See HEP - performed and demonstrated exercises with pt.  Also discussed gym exercises/machines appropriate for pt.      PATIENT EDUCATION:  Education details: HEP Person educated: Patient Education method: Consulting civil engineer, Media planner, and Handouts Education  comprehension: verbalized understanding, returned demonstration, and needs further education  HOME EXERCISE PROGRAM: Access Code: [email or web address] URL: [email or web address] Date: 12/13/2021 Prepared by: Faustino Congress  Exercises - Supine Piriformis Stretch with Foot on Ground  - 2 x daily - 7 x weekly - 1 sets - 3 reps - 30 sec hold - Hooklying Single Knee to Chest  - 2 x daily - 7  x weekly - 1 sets - 3 reps - 30 sec hold - Seated Hamstring Stretch  - 2 x daily - 7 x weekly - 1 sets - 3 reps - 30 sec hold - Supine Posterior Pelvic Tilt  - 2 x daily - 7 x weekly - 1 sets - 10 reps - 5 sec hold - Supine Bridge  - 2 x daily - 7 x weekly - 1 sets - 10 reps - 5 sec hold - Seated Scapular Retraction  - 2 x daily - 7 x weekly - 1 sets - 10 reps - 5 sec hold - Sidelying Hip Circles  - 1 x daily - 7 x weekly - 3 sets - 10 reps  ASSESSMENT:  CLINICAL IMPRESSION: Patient is a 71 y.o. female who was seen today for physical therapy evaluation and treatment for chronic back pain.  She demonstrates decreased ROM and flexibility as well as decreased strength and continued pain affecting functional mobility.  She will benefit from PT to address deficits listed.   OBJECTIVE IMPAIRMENTS: decreased activity tolerance, decreased mobility, difficulty walking, decreased ROM, decreased strength, postural dysfunction, and pain.   ACTIVITY LIMITATIONS: carrying, lifting, bending, sitting, standing, squatting, and locomotion level  PARTICIPATION LIMITATIONS: meal prep, cleaning, laundry, shopping, and community activity  PERSONAL FACTORS: 3+ comorbidities: Depression, DM, HTN, myositis  are also affecting patient's functional outcome.   REHAB POTENTIAL: Good  CLINICAL DECISION MAKING: Evolving/moderate complexity  EVALUATION COMPLEXITY: Moderate   GOALS:  Goals reviewed with patient? Yes  SHORT TERM GOALS: Target date: 01/10/2022  Independent with initial HEP Goal status:  INITIAL   LONG TERM GOALS: Target date: 02/07/2022  Independent with final HEP Goal status: INITIAL  2.  FOTO score improved to 60 Goal status: INITIAL  3.  Bil hip strength improved to 4/5 for improved function and mobiltiy Goal status: INIITAL  4.  Report pain < 4/10 with amb > 10 min for improved function Goal status: INITIAL   PLAN:  PT FREQUENCY: 1x/week; plan to see every other week, but will see 1x/wk if needed  PT DURATION: 8 weeks  PLANNED INTERVENTIONS: Therapeutic exercises, Therapeutic activity, Neuromuscular re-education, Gait training, Patient/Family education, Self Care, Joint mobilization, Stair training, Aquatic Therapy, Dry Needling, Electrical stimulation, Spinal manipulation, Spinal mobilization, Cryotherapy, Moist heat, Taping, Traction, Manual therapy, and Re-evaluation.  PLAN FOR NEXT SESSION: review HEP, core/hip strengthening   Laureen Abrahams, PT, DPT 12/13/21 11:59 AM     PHYSICAL THERAPY DISCHARGE SUMMARY  Visits from Start of Care: 1  Current functional level related to goals / functional outcomes: See above   Remaining deficits: See above   Education / Equipment: HEP   Patient agrees to discharge. Patient goals were not met. Patient is being discharged due to not returning since the last visit.   Laureen Abrahams, PT, DPT 01/31/22 1:51 PM  [facility] [address] [place], [place], [address] Phone: [phone]   Fax:  [phone]

## 2021-12-19 ENCOUNTER — Ambulatory Visit
Admission: RE | Admit: 2021-12-19 | Discharge: 2021-12-19 | Disposition: A | Discharge status: Home / Self Care | Payer: Medicare Other | Source: Ambulatory Visit | Attending: Internal Medicine | Admitting: Internal Medicine

## 2021-12-19 DIAGNOSIS — Z1231 Encounter for screening mammogram for malignant neoplasm of breast: Secondary | ICD-10-CM | POA: Diagnosis not present

## 2021-12-21 ENCOUNTER — Telehealth: Payer: Self-pay

## 2021-12-21 NOTE — Chronic Care Management (AMB) (Signed)
    Called Michelle Hicks, No answer, left message of appointment on 12-23-2021 at 10:00 via telephone visit with Orlando Penner, Pharm D. Notified to have all medications, supplements, blood pressure and/or blood sugar logs available during appointment and to return call if need to reschedule.  Care Gaps: None  Star Rating Drug: Ozempic 1 mg- PAP Telmisartan 80 mg- Last filled 10-07-2021 100 DS [facility]  Any gaps in medications fill history? No  [place] Pharmacist Assistant [phone]

## 2021-12-23 ENCOUNTER — Ambulatory Visit (INDEPENDENT_AMBULATORY_CARE_PROVIDER_SITE_OTHER): Payer: Medicare Other

## 2021-12-23 DIAGNOSIS — E1169 Type 2 diabetes mellitus with other specified complication: Secondary | ICD-10-CM

## 2021-12-23 DIAGNOSIS — I10 Essential (primary) hypertension: Secondary | ICD-10-CM

## 2021-12-23 DIAGNOSIS — E1165 Type 2 diabetes mellitus with hyperglycemia: Secondary | ICD-10-CM

## 2021-12-23 NOTE — Progress Notes (Unsigned)
Chronic Care Management Pharmacy Note  12/27/2021 Name:  Michelle Hicks MRN:  [id number] DOB:  05-08-1950  Summary: Patient reports that she is not doing as well as she hoped at this time.  She is worried that a new medication prescribed by a specialist is causing her side effects.   Recommendations/Changes made from today's visit: Recommend Michelle Hicks begin to count carbs in her meals to determine her blood sugar changes  Recommend she voice her concerns about her medications to her specialist.   Plan: HC to call Michelle Hicks after 9:30 am once per week to review BS, and Meal log Discussed using a meal log to review what she is eating through out the day with the [facility].    Subjective: Michelle Hicks is an 71 y.o. year old female who is a primary patient of Glendale Chard, MD.  The CCM team was consulted for assistance with disease management and care coordination needs.    Engaged with patient by telephone for follow up visit in response to provider referral for pharmacy case management and/or care coordination services.   Consent to Services:  The patient was given information about Chronic Care Management services, agreed to services, and gave verbal consent prior to initiation of services.  Please see initial visit note for detailed documentation.   Patient Care Team: Glendale Chard, MD as PCP - General (Internal Medicine) Mayford Knife, [facility] (Pharmacist) Lynne Logan, RN as [place] Management  Recent office visits: 12/08/2021 PCP OV 11/02/2021 PCP OV   Recent consult visits: 12/08/2021 PharmD Cardiology OV 12/06/2021 [facility] visits: None in previous 6 months   Objective:  Lab Results  Component Value Date   CREATININE 0.75 07/13/2021   BUN 13 07/13/2021   EGFR 87 07/13/2021   GFRNONAA 73 03/24/2020   GFRAA 84 03/24/2020   NA 139 07/22/2021   K 3.7 07/22/2021   CALCIUM 10.0 07/13/2021   CO2 22 07/13/2021    GLUCOSE 106 (H) 07/13/2021    Lab Results  Component Value Date/Time   HGBA1C 7.2 (H) 12/08/2021 10:33 AM   HGBA1C 7.1 (H) 07/13/2021 10:45 AM   HGBA1C 7.3 09/04/2017 12:00 AM   MICROALBUR 10 07/06/2020 12:47 PM   MICROALBUR 30 03/24/2020 10:27 AM    Last diabetic Eye exam:  Lab Results  Component Value Date/Time   HMDIABEYEEXA No Retinopathy 01/11/2021 12:00 AM    Last diabetic Foot exam: No results found for: "HMDIABFOOTEX"   Lab Results  Component Value Date   CHOL 228 (H) 12/08/2021   HDL 93 12/08/2021   LDLCALC 121 (H) 12/08/2021   TRIG 81 12/08/2021   CHOLHDL 2.5 12/08/2021       Latest Ref Rng & Units 11/21/2021    9:05 AM 08/25/2021    8:50 AM 08/18/2021    9:56 AM  Hepatic Function  Total Protein 6.0 - 8.5 g/dL 7.4  7.4  7.6     Lab Results  Component Value Date/Time   TSH 0.876 06/06/2021 03:32 PM   TSH 1.020 10/06/2020 11:17 AM   FREET4 1.18 06/06/2021 03:32 PM   FREET4 1.12 10/06/2020 11:17 AM       Latest Ref Rng & Units 07/22/2021    9:10 AM 07/22/2021    9:06 AM 07/13/2021   10:45 AM  CBC  WBC 3.4 - 10.8 x10E3/uL   9.2   Hemoglobin 12.0 - 15.0 g/dL 11.6  11.6    11.6  11.9  Hematocrit 36.0 - 46.0 % 34.0  34.0    34.0  35.2   Platelets 150 - 450 x10E3/uL   322     No results found for: "VD25OH"  Clinical ASCVD: No  The 10-year ASCVD risk score (Arnett DK, et al., 2019) is: 22.3%   Values used to calculate the score:     Age: 41 years     Sex: Female     Is Non-Hispanic African American: Yes     Diabetic: Yes     Tobacco smoker: No     Systolic Blood Pressure: 158 mmHg     Is BP treated: Yes     HDL Cholesterol: 93 mg/dL     Total Cholesterol: 228 mg/dL       03/31/2021    9:56 AM 03/24/2020   10:01 AM 07/10/2019   10:14 AM  Depression screen PHQ 2/9  Decreased Interest 0 0 0  Down, Depressed, Hopeless 0 0 0  PHQ - 2 Score 0 0 0  Altered sleeping   3  Tired, decreased energy   0  Change in appetite   0  Feeling bad or failure about  yourself    0  Trouble concentrating   0  Moving slowly or fidgety/restless   0  Suicidal thoughts   0  PHQ-9 Score   3  Difficult doing work/chores   Not difficult at all     Social History   Tobacco Use  Smoking Status Former   Packs/day: 0.75   Years: 20.00   Total pack years: 15.00   Types: Cigarettes   Quit date: 2002   Years since quitting: 21.8   Passive exposure: Past  Smokeless Tobacco Never   BP Readings from Last 3 Encounters:  12/08/21 112/70  12/06/21 113/71  11/23/21 (!) 118/58   Pulse Readings from Last 3 Encounters:  12/08/21 93  12/06/21 96  11/23/21 98   Wt Readings from Last 3 Encounters:  12/08/21 238 lb 3.2 oz (108 kg)  12/06/21 238 lb 14.4 oz (108.4 kg)  11/23/21 242 lb 12.8 oz (110.1 kg)   BMI Readings from Last 3 Encounters:  12/08/21 41.93 kg/m  12/06/21 42.32 kg/m  11/23/21 41.68 kg/m    Assessment/Interventions: Review of patient past medical history, allergies, medications, health status, including review of consultants reports, laboratory and other test data, was performed as part of comprehensive evaluation and provision of chronic care management services.   SDOH:  (Social Determinants of Health) assessments and interventions performed: No SDOH Interventions    Flowsheet Row Chronic Care Management from 08/18/2020 in [facility] Chronic Care Management from 08/06/2019 in [facility] Chronic Care Management from 07/25/2019 in [facility] Chronic Care Management from 07/24/2019 in [facility] Clinical Support from 07/10/2019 in [facility]  SDOH Interventions       Food Insecurity Interventions -- -- -- Intervention Not Indicated --  Housing Interventions -- -- -- Intervention Not Indicated --  Transportation Interventions -- -- -- Intervention Not Indicated --  Depression Interventions/Treatment  -- -- -- -- PHQ2-9 Score <4  Follow-up Not Indicated  Financial Strain Interventions Other (Comment)  [assistance completed for Ozempic] Other (Comment)  [Assisted patient with completing patient portion of Ozempic PAP application] Other (Comment)  [Will help patient with patient assistance application for Ozempic] -- --      SDOH Screenings   Food Insecurity: No Food Insecurity (03/31/2021)  Housing: Low Risk  (  07/24/2019)  Transportation Needs: No Transportation Needs (03/31/2021)  Depression (PHQ2-9): Low Risk  (03/31/2021)  Financial Resource Strain: High Risk (04/29/2021)  Physical Activity: Inactive (03/31/2021)  Stress: No Stress Concern Present (03/31/2021)  Tobacco Use: Medium Risk (12/19/2021)    CCM Care Plan  Allergies  Allergen Reactions   Statins     CK elevation   Sulfa Antibiotics Rash   Sulfamethoxazole-Trimethoprim Rash    Medications Reviewed Today     Reviewed by Mayford Knife, RPH (Pharmacist) on 12/27/21 at [facility]  Med List Status: <None>   Medication Order Taking? Sig Documenting Provider Last Dose Status Informant  Accu-Chek Softclix Lancets lancets [id number] No Use as instructed to check blood sugars twice daily E11.69 Glendale Chard, MD Taking Active   amLODipine (NORVASC) 5 MG tablet [id number] No Take 1 tablet (5 mg total) by mouth daily. Skeet Latch, MD Taking Active Self  aspirin EC 81 MG tablet [id number] No Take 81 mg by mouth daily. Some times [provider] Taking Active Self  Blood Glucose Monitoring Suppl (ACCU-CHEK GUIDE) w/Device KIT [id number] No Use to check blood sugars twice daily E11.69 Glendale Chard, MD Taking Active   fluticasone [facility]) 50 MCG/ACT nasal spray [id number] No Place 1 spray into both nostrils daily. [provider] Taking Active Self  folic acid (FOLVITE) 1 MG tablet [id number] No Take 1 mg by mouth daily. [provider] Taking Active   glucose blood (ACCU-CHEK GUIDE) test strip [id number] No Use as instructed to check  blood sugars twice daily E11.69 Glendale Chard, MD Taking Active   hydrochlorothiazide (HYDRODIURIL) 25 MG tablet [id number] No TAKE 1 TABLET BY MOUTH  DAILY Glendale Chard, MD Taking Active   hydroxychloroquine (PLAQUENIL) 200 MG tablet [id number] No Take 200 mg by mouth daily. [provider] Taking Active Self  levothyroxine (SYNTHROID) 50 MCG tablet [id number] No Take 50 mcg by mouth daily.  [provider] Taking Active Self  methotrexate (RHEUMATREX) 2.5 MG tablet [id number] No Take 10 mg by mouth once a week. [provider] Taking Active   Multiple Vitamin (MULTIVITAMIN) tablet [id number] No Take 1 tablet by mouth daily. [provider] Taking Active Self  omeprazole (PRILOSEC) 40 MG capsule [id number] No TAKE 1 CAPSULE BY MOUTH  DAILY BEFORE A MEAL Glendale Chard, MD Taking Active Self  predniSONE (DELTASONE) 10 MG tablet [id number] No Take 10 mg by mouth daily with breakfast. Taper [provider] Taking Active Self  Semaglutide, 1 MG/DOSE, (OZEMPIC, 1 MG/DOSE,) 2 MG/1.5ML SOPN [id number] No Inject 1 mg into the skin once a week. Glendale Chard, MD Taking Active Self  sodium chloride flush (NS) 0.9 % injection 3 mL [id number]   Skeet Latch, MD  Active   telmisartan (MICARDIS) 80 MG tablet [id number] No TAKE 1 TABLET BY MOUTH  DAILY Glendale Chard, MD Taking Active   traZODone (DESYREL) 50 MG tablet [id number] No TAKE 1 TABLET BY MOUTH AT  BEDTIME AS NEEDED FOR SLEEP Dohmeier, Asencion Partridge, MD Taking Active             Patient Active Problem List   Diagnosis Date Noted   Diabetes mellitus ([place]) 12/08/2021   Spinal stenosis of lumbar region 12/06/2021   Right flank pain 08/30/2021   Acute right-sided low back pain 08/30/2021   Normocytic anemia 08/30/2021   Mixed hyperlipidemia 07/17/2021   Thyroid nodule 07/17/2021   Pulmonary nodule 07/17/2021   Myositis 07/17/2021   Pericardial calcification 07/12/2021   Tachycardia 05/30/2021   Exertional  dyspnea 05/30/2021  OSA on CPAP 05/26/2021   GERD with apnea 05/26/2021   Attention deficit hyperactivity disorder, predominantly inattentive type 12/06/2020   History of anemia 12/06/2020   Hyperglycemia due to type 2 diabetes mellitus ([place]) 12/06/2020   Hypothyroidism 12/06/2020   Menopausal symptom 12/06/2020   Non-toxic goiter 12/06/2020   Pure hypercholesterolemia 12/06/2020   Positive ANA (antinuclear antibody) 12/06/2020   Pain in left knee 12/06/2020   Chronic insomnia 04/26/2020   Snoring 04/26/2020   Class 3 severe obesity due to excess calories with serious comorbidity and body mass index (BMI) of 40.0 to 44.9 in adult ([place]) 03/18/2019   Essential hypertension, benign 11/13/2017   Gastroesophageal reflux disease without esophagitis 11/13/2017   Hypertension 09/04/2017   Type II diabetes mellitus, uncontrolled 09/04/2017   S/P abdominal hysterectomy and left salpingo-oophorectomy 08/18/1994    Immunization History  Administered Date(s) Administered   DTaP 02/28/2012   Fluad Quad(high Dose 65+) 11/07/2019, 11/02/2021   Influenza, High Dose Seasonal PF 11/14/2017, 10/30/2018   Influenza-Unspecified 11/14/2017, 10/30/2018, 11/10/2020   Moderna Covid-19 Vaccine Bivalent Booster 110yr & up 11/10/2020   Moderna Sars-Covid-2 Vaccination 03/28/2019, 04/25/2019, 02/03/2020, 07/04/2020, 11/08/2021   Pneumococcal Conjugate-13 04/12/2018   Pneumococcal Polysaccharide-23 12/30/2020   Pneumococcal-Unspecified 08/06/2013   Zoster Recombinat (Shingrix) 11/14/2018, 07/29/2019    Conditions to be addressed/monitored:  Hypertension, Hyperlipidemia, and Diabetes  Care Plan : [place] Updates made by [place]ayford Knife [place] 12/27/2021 12:00 AM     Problem: HTN, DM II, HLD   Priority: High     Long-Range Goal: Disease Management   Recent Progress: On track  Priority: High  Note:   Current Barriers:  Unable to independently monitor therapeutic  efficacy Unable to achieve control of hyperlipidemia   Pharmacist Clinical Goal(s):  Patient will achieve adherence to monitoring guidelines and medication adherence to achieve therapeutic efficacy through collaboration with PharmD and provider.   Interventions: 1:1 collaboration with SGlendale Chard MD regarding development and update of comprehensive plan of care as evidenced by provider attestation and co-signature Inter-disciplinary care team collaboration (see longitudinal plan of care) Comprehensive medication review performed; medication list updated in electronic medical record  Hypertension (BP goal <130/80) -Controlled -Current treatment: Amlodipine 5 mg tablet once per day Appropriate, Effective, Safe, Accessible Telmisartan 80 mg tablet once per day Appropriate, Effective, Safe, Accessible Hydrochlorothiazide 25 mg tablet once per day Appropriate, Effective, Safe, Accessible -Current home readings: 111/56 -Current dietary habits: she is limiting the amount of salt she is eating  -Current exercise habits: patient has been having pain issues will need to discuss this during the next visit  -Denies hypotensive/hypertensive symptoms -Educated on Symptoms of hypotension and importance of maintaining adequate hydration; -Counseled to monitor BP at home at least twice per week, document, and provide log at future appointments -Recommended to continue current medication  Hyperlipidemia: (LDL goal < 55) -Uncontrolled -Current treatment: No current treatment at this time  Repatha - patient to be started on this through the lipid clinic, pending prior authorization She has not received any follow up yet from the clinic  -Educated on the steps that are needed to complete paper work for certain medications, we reviewed the timeline for approval can be long  -Recommended to continue current medication  Diabetes (A1c goal <7%) -Not ideally controlled -Current medications: Ozempic 1  mg once per week Appropriate, Query effective  -Current home glucose readings: Ms. MLothhas been checking her BS more frequently in the evening  fasting glucose: 103  post prandial glucose: 185, 150  -Denies hypoglycemic/hyperglycemic symptoms -Current meal patterns:  breakfast: BLT  - Natures Own Honey Wheat Bread, hellman's mayonnaise lunch: can of tuna packed in water, saltine crackers, lettuce and tomato with cheese  dinner: smoked sausage - 2 ounces, with white potatoes with onion, green pepper and red pepper, with pinto beans and potato rolls snacks: lemon cream yogurt with 4 vanilla wafers  drinks: crystal light lemonade, 64 ounces  -Current exercise: will discuss during next visit  -Educated on A1c and blood sugar goals; Complications of diabetes including kidney damage, retinal damage, and cardiovascular disease; Carbohydrate counting and/or plate method -Counseled to check feet daily and get yearly eye exams -We discussed possibly increasing her Ozempic dose, at this time she does not feel comfortable with that type of change because she is concerned about side effects  -Counseled on diet and exercise extensively Recommended to continue current medication  Patient Goals/Self-Care Activities Patient will:  - take medications as prescribed as evidenced by patient report and record review collaborate with provider on medication access solutions  Follow Up Plan: The patient has been provided with contact information for the care management team and has been advised to call with any health related questions or concerns.       Medication Assistance: None required.  Patient affirms current coverage meets needs.  Compliance/Adherence/Medication fill history: Care Gaps: No care gaps noted   Star-Rating Drugs: Ozempic 1 mg once a week  Telmisartan 80 mg tablet   Patient's preferred pharmacy is:  [facility] [place], [address] [place] [address] Phone: [phone] Fax: [phone]  [place], [address] [place] [address] Phone: [phone] Fax: [phone]  Uses pill box? Yes Pt endorses 90% compliance  We discussed: Benefits of medication synchronization, packaging and delivery as well as enhanced pharmacist oversight with [employer]. Patient decided to: Continue current medication management strategy  Care Plan and Follow Up Patient Decision:  Patient agrees to Care Plan and Follow-up.  Plan: The patient has been provided with contact information for the care management team and has been advised to call with any health related questions or concerns.   Orlando Penner, CPP, PharmD Clinical Pharmacist Practitioner [facility] [phone]

## 2021-12-27 ENCOUNTER — Encounter: Payer: Medicare Other | Admitting: Physical Therapy

## 2021-12-27 ENCOUNTER — Telehealth: Payer: Self-pay | Admitting: Pharmacist

## 2021-12-27 ENCOUNTER — Encounter: Payer: Self-pay | Admitting: Pharmacist

## 2021-12-27 DIAGNOSIS — I318 Other specified diseases of pericardium: Secondary | ICD-10-CM

## 2021-12-27 DIAGNOSIS — E782 Mixed hyperlipidemia: Secondary | ICD-10-CM

## 2021-12-27 MED ORDER — REPATHA SURECLICK 140 MG/ML ~~LOC~~ SOAJ: 1.0000 mL | SUBCUTANEOUS | 3 refills | Status: DC

## 2021-12-27 NOTE — Telephone Encounter (Signed)
PA for Repatha submitted. Key: [id number].  PA approved until 06/27/22.

## 2021-12-27 NOTE — Patient Instructions (Addendum)
Visit Information It was great speaking with you today!  Please let me know if you have any questions about our visit.   Goals Addressed             This Visit's Progress    Manage My Medicine       Timeframe:  Long-Range Goal Priority:  High Start Date:                             Expected End Date:                       Follow Up Date: 01/27/2022    In Progress:   - call for medicine refill 2 or 3 days before it runs out - call if I am sick and can't take my medicine - keep a list of all the medicines I take; vitamins and herbals too - use an alarm clock or phone to remind me to take my medicine    Why is this important?   These steps will help you keep on track with your medicines.   Notes:  Please call if you have any questions         Patient Care Plan: CCM Pharmacy Care Plan     Problem Identified: HTN, DM II, HLD   Priority: High     Long-Range Goal: Disease Management   Recent Progress: On track  Priority: High  Note:   Current Barriers:  Unable to independently monitor therapeutic efficacy Unable to achieve control of hyperlipidemia   Pharmacist Clinical Goal(s):  Patient will achieve adherence to monitoring guidelines and medication adherence to achieve therapeutic efficacy through collaboration with PharmD and provider.   Interventions: 1:1 collaboration with Glendale Chard, MD regarding development and update of comprehensive plan of care as evidenced by provider attestation and co-signature Inter-disciplinary care team collaboration (see longitudinal plan of care) Comprehensive medication review performed; medication list updated in electronic medical record  Hypertension (BP goal <130/80) -Controlled -Current treatment: Amlodipine 5 mg tablet once per day Appropriate, Effective, Safe, Accessible Telmisartan 80 mg tablet once per day Appropriate, Effective, Safe, Accessible Hydrochlorothiazide 25 mg tablet once per day Appropriate,  Effective, Safe, Accessible -Current home readings: 111/56 -Current dietary habits: she is limiting the amount of salt she is eating  -Current exercise habits: patient has been having pain issues will need to discuss this during the next visit  -Denies hypotensive/hypertensive symptoms -Educated on Symptoms of hypotension and importance of maintaining adequate hydration; -Counseled to monitor BP at home at least twice per week, document, and provide log at future appointments -Recommended to continue current medication  Hyperlipidemia: (LDL goal < 55) -Uncontrolled -Current treatment: No current treatment at this time  Repatha - patient to be started on this through the lipid clinic, pending prior authorization She has not received any follow up yet from the clinic  -Educated on the steps that are needed to complete paper work for certain medications, we reviewed the timeline for approval can be long  -Recommended to continue current medication  Diabetes (A1c goal <7%) -Not ideally controlled -Current medications: Ozempic 1 mg once per week Appropriate, Query effective  -Current home glucose readings: Ms. Santino has been checking her BS more frequently in the evening  fasting glucose: 103  post prandial glucose: 185, 150  -Denies hypoglycemic/hyperglycemic symptoms -Current meal patterns:  breakfast: BLT  - [employer] Own Honey Wheat Bread, hellman's mayonnaise lunch: can  of tuna packed in water, saltine crackers, lettuce and tomato with cheese  dinner: smoked sausage - 2 ounces, with white potatoes with onion, green pepper and red pepper, with pinto beans and potato rolls snacks: lemon cream yogurt with 4 vanilla wafers  drinks: crystal light lemonade, 64 ounces  -Current exercise: will discuss during next visit  -Educated on A1c and blood sugar goals; Complications of diabetes including kidney damage, retinal damage, and cardiovascular disease; Carbohydrate counting and/or plate  method -Counseled to check feet daily and get yearly eye exams -We discussed possibly increasing her Ozempic dose, at this time she does not feel comfortable with that type of change because she is concerned about side effects  -Counseled on diet and exercise extensively Recommended to continue current medication  Patient Goals/Self-Care Activities Patient will:  - take medications as prescribed as evidenced by patient report and record review collaborate with provider on medication access solutions  Follow Up Plan: The patient has been provided with contact information for the care management team and has been advised to call with any health related questions or concerns.        Patient agreed to services and verbal consent obtained.   The patient verbalized understanding of instructions, educational materials, and care plan provided today and agreed to receive a mailed copy of patient instructions, educational materials, and care plan.   Orlando Penner, PharmD Clinical Pharmacist [facility] [phone]

## 2021-12-29 ENCOUNTER — Telehealth: Payer: Self-pay

## 2021-12-29 NOTE — Chronic Care Management (AMB) (Addendum)
    Chronic Care Management Pharmacy Assistant   Name: Michelle Hicks  MRN: [id number] DOB: November 28, 1950  Reason for Encounter: Blood sugar readings   12-29-2021: Left VM for readings. 11-16 fasting 85 after breakfast 165 ate 2 eggs 1 pancake. 11-15 Fasting 98 after lunch 109 after eating a fish sandwich. 11-14 after breakfast 150 no fasting reading and doesn't remember what she ate. No readings for 11-13. 11-12 after eating a slice of sweet potato pie 226 no fasting reading. 11-11 Fasting 87 after eating 151 doesn't remember. Informed patient that I will contact her weekly for sugar readings and to log fasting/ non fasting and what she eats daily.  01-04-2022: Left VM for blood sugar readings  01-10-2022: Left VM for BS readings  01-11-2022: Patient called back with readings. 11-22 fasting 104 didn't document in meal log. 11-23 Fasting 93. After eating [place] bacon, eggs and toast 104 01-07-2022 Fasting 95 after eating fried shrimp, hush puppies and slaw 199. At bedtime 129. 11-26 fasting 115 after eating sweet meatballs and celery 186. 11-27 fasting 97 after eating oatmeal/blueberries 163 11-28 fasting 107 after eating a mall slice of sausage pizza and tea 157 11-29 Fasting 93 cereal for breakfast.   Medications: Outpatient Encounter Medications as of 12/29/2021  Medication Sig   Evolocumab (REPATHA SURECLICK) 245 MG/ML SOAJ Inject 140 mg into the skin every 14 (fourteen) days.   Accu-Chek Softclix Lancets lancets Use as instructed to check blood sugars twice daily E11.69   amLODipine (NORVASC) 5 MG tablet Take 1 tablet (5 mg total) by mouth daily.   aspirin EC 81 MG tablet Take 81 mg by mouth daily. Some times   Blood Glucose Monitoring Suppl (ACCU-CHEK GUIDE) w/Device KIT Use to check blood sugars twice daily E11.69   fluticasone (FLONASE) 50 MCG/ACT nasal spray Place 1 spray into both nostrils daily.   folic acid (FOLVITE) 1 MG tablet Take 1 mg by mouth daily.   glucose blood  (ACCU-CHEK GUIDE) test strip Use as instructed to check blood sugars twice daily E11.69   hydrochlorothiazide (HYDRODIURIL) 25 MG tablet TAKE 1 TABLET BY MOUTH  DAILY   hydroxychloroquine (PLAQUENIL) 200 MG tablet Take 200 mg by mouth daily.   levothyroxine (SYNTHROID) 50 MCG tablet Take 50 mcg by mouth daily.    methotrexate (RHEUMATREX) 2.5 MG tablet Take 10 mg by mouth once a week.   Multiple Vitamin (MULTIVITAMIN) tablet Take 1 tablet by mouth daily.   omeprazole (PRILOSEC) 40 MG capsule TAKE 1 CAPSULE BY MOUTH  DAILY BEFORE A MEAL   predniSONE (DELTASONE) 10 MG tablet Take 10 mg by mouth daily with breakfast. Taper   Semaglutide, 1 MG/DOSE, (OZEMPIC, 1 MG/DOSE,) 2 MG/1.5ML SOPN Inject 1 mg into the skin once a week.   telmisartan (MICARDIS) 80 MG tablet TAKE 1 TABLET BY MOUTH  DAILY   traZODone (DESYREL) 50 MG tablet TAKE 1 TABLET BY MOUTH AT  BEDTIME AS NEEDED FOR SLEEP   Facility-Administered Encounter Medications as of 12/29/2021  Medication   sodium chloride flush (NS) 0.9 % injection 3 mL    [place] Clinical Pharmacist Assistant [phone]

## 2022-01-02 ENCOUNTER — Encounter: Payer: Medicare Other | Admitting: Physical Therapy

## 2022-01-03 ENCOUNTER — Encounter: Payer: Self-pay | Admitting: Internal Medicine

## 2022-01-03 ENCOUNTER — Other Ambulatory Visit: Payer: Medicare Other

## 2022-01-03 DIAGNOSIS — E782 Mixed hyperlipidemia: Secondary | ICD-10-CM | POA: Diagnosis not present

## 2022-01-03 DIAGNOSIS — M609 Myositis, unspecified: Secondary | ICD-10-CM

## 2022-01-03 DIAGNOSIS — I318 Other specified diseases of pericardium: Secondary | ICD-10-CM

## 2022-01-04 ENCOUNTER — Other Ambulatory Visit: Payer: Self-pay | Admitting: Endocrinology

## 2022-01-04 ENCOUNTER — Ambulatory Visit (INDEPENDENT_AMBULATORY_CARE_PROVIDER_SITE_OTHER): Payer: Medicare Other | Admitting: Internal Medicine

## 2022-01-04 ENCOUNTER — Encounter: Payer: Self-pay | Admitting: Internal Medicine

## 2022-01-04 ENCOUNTER — Ambulatory Visit: Payer: Medicare Other | Admitting: Internal Medicine

## 2022-01-04 VITALS — BP 120/60 | HR 104 | Temp 98.5°F | Ht 63.2 in | Wt 243.0 lb

## 2022-01-04 DIAGNOSIS — L02419 Cutaneous abscess of limb, unspecified: Secondary | ICD-10-CM | POA: Diagnosis not present

## 2022-01-04 DIAGNOSIS — M359 Systemic involvement of connective tissue, unspecified: Secondary | ICD-10-CM | POA: Diagnosis not present

## 2022-01-04 DIAGNOSIS — E049 Nontoxic goiter, unspecified: Secondary | ICD-10-CM | POA: Diagnosis not present

## 2022-01-04 DIAGNOSIS — I10 Essential (primary) hypertension: Secondary | ICD-10-CM | POA: Diagnosis not present

## 2022-01-04 DIAGNOSIS — E1165 Type 2 diabetes mellitus with hyperglycemia: Secondary | ICD-10-CM

## 2022-01-04 DIAGNOSIS — E039 Hypothyroidism, unspecified: Secondary | ICD-10-CM | POA: Diagnosis not present

## 2022-01-04 DIAGNOSIS — Z6841 Body Mass Index (BMI) 40.0 and over, adult: Secondary | ICD-10-CM

## 2022-01-04 LAB — LIPID PANEL
Chol/HDL Ratio: 2.5 ratio (ref 0.0–4.4)
Cholesterol, Total: 201 mg/dL — ABNORMAL HIGH (ref 100–199)
HDL: 81 mg/dL (ref >39)
LDL Chol Calc (NIH): 108 mg/dL — ABNORMAL HIGH (ref 0–99)
Triglycerides: 67 mg/dL (ref 0–149)
VLDL Cholesterol Cal: 12 mg/dL (ref 5–40)

## 2022-01-04 LAB — CMP14+EGFR
ALT: 27 IU/L (ref 0–32)
AST: 27 IU/L (ref 0–40)
Albumin/Globulin Ratio: 1.7 (ref 1.2–2.2)
Albumin: 4.4 g/dL (ref 3.9–4.9)
Alkaline Phosphatase: 62 IU/L (ref 44–121)
BUN/Creatinine Ratio: 16 (ref 12–28)
BUN: 13 mg/dL (ref 8–27)
Bilirubin Total: 0.3 mg/dL (ref 0.0–1.2)
CO2: 20 mmol/L (ref 20–29)
Calcium: 9.8 mg/dL (ref 8.7–10.3)
Chloride: 100 mmol/L (ref 96–106)
Creatinine, Ser: 0.82 mg/dL (ref 0.57–1.00)
Globulin, Total: 2.6 g/dL (ref 1.5–4.5)
Glucose: 93 mg/dL (ref 70–99)
Potassium: 4.1 mmol/L (ref 3.5–5.2)
Sodium: 136 mmol/L (ref 134–144)
Total Protein: 7 g/dL (ref 6.0–8.5)
eGFR: 77 mL/min/{1.73_m2} (ref >59)

## 2022-01-04 LAB — CK: Total CK: 424 U/L — ABNORMAL HIGH (ref 32–182)

## 2022-01-04 NOTE — Progress Notes (Signed)
Rich Brave [place]leton,acting as a Education administrator for [name], MD.,have documented all relevant documentation on the behalf of [name], MD,as directed by  [name], MD while in the presence of [name], MD.    Subjective:     Patient ID: Michelle Hicks , female    DOB: 1951/02/11 , 71 y.o.   MRN: [id number]   Chief Complaint  Patient presents with   Mass    HPI  Patient presents today for an abscess under the armpit. Patient reports the boil has burst and is now draining. First noticed about ten days agp, there was pain with washing her armpits. She had similar sx about a month ago.      Past Medical History:  Diagnosis Date   Depression    DM (diabetes mellitus) ([place])    Exertional dyspnea 05/30/2021   HTN (hypertension)    Pericardial calcification 07/12/2021   Tachycardia 05/30/2021     Family History  Problem Relation Age of Onset   Diabetes Mother    Pancreatic cancer Mother    Hypertension Father    Heart attack Father 53   Breast cancer Neg Hx      Current Outpatient Medications:    Accu-Chek Softclix Lancets lancets, Use as instructed to check blood sugars twice daily E11.69, Disp: 100 each, Rfl: 2   amLODipine (NORVASC) 5 MG tablet, Take 1 tablet (5 mg total) by mouth daily., Disp: 90 tablet, Rfl: 3   aspirin EC 81 MG tablet, Take 81 mg by mouth daily. Some times, Disp: , Rfl:    Blood Glucose Monitoring Suppl (ACCU-CHEK GUIDE) w/Device KIT, Use to check blood sugars twice daily E11.69, Disp: 1 kit, Rfl: 1   Evolocumab (REPATHA SURECLICK) 034 MG/ML SOAJ, Inject 140 mg into the skin every 14 (fourteen) days., Disp: 6 mL, Rfl: 3   fluticasone (FLONASE) 50 MCG/ACT nasal spray, Place 1 spray into both nostrils daily., Disp: , Rfl:    folic acid (FOLVITE) 1 MG tablet, Take 1 mg by mouth daily., Disp: , Rfl:    glucose blood (ACCU-CHEK GUIDE) test strip, Use as instructed to check blood sugars twice daily E11.69, Disp: 100 each, Rfl: 2    hydrochlorothiazide (HYDRODIURIL) 25 MG tablet, TAKE 1 TABLET BY MOUTH  DAILY, Disp: 100 tablet, Rfl: 2   hydroxychloroquine (PLAQUENIL) 200 MG tablet, Take 200 mg by mouth daily., Disp: , Rfl:    levothyroxine (SYNTHROID) 50 MCG tablet, Take 50 mcg by mouth daily. , Disp: , Rfl:    methotrexate (RHEUMATREX) 2.5 MG tablet, Take 10 mg by mouth once a week., Disp: , Rfl:    Multiple Vitamin (MULTIVITAMIN) tablet, Take 1 tablet by mouth daily., Disp: , Rfl:    omeprazole (PRILOSEC) 40 MG capsule, TAKE 1 CAPSULE BY MOUTH  DAILY BEFORE A MEAL, Disp: 90 capsule, Rfl: 2   predniSONE (DELTASONE) 10 MG tablet, Take 10 mg by mouth daily with breakfast. Taper, Disp: , Rfl:    Semaglutide, 1 MG/DOSE, (OZEMPIC, 1 MG/DOSE,) 2 MG/1.5ML SOPN, Inject 1 mg into the skin once a week., Disp: 4.5 mL, Rfl: 3   telmisartan (MICARDIS) 80 MG tablet, TAKE 1 TABLET BY MOUTH  DAILY, Disp: 90 tablet, Rfl: 3   traZODone (DESYREL) 50 MG tablet, TAKE 1 TABLET BY MOUTH AT  BEDTIME AS NEEDED FOR SLEEP, Disp: 90 tablet, Rfl: 1   doxycycline (VIBRA-TABS) 100 MG tablet, Take 1 tablet (100 mg total) by mouth 2 (two) times daily., Disp: 20  tablet, Rfl: 0  Current Facility-Administered Medications:    sodium chloride flush (NS) 0.9 % injection 3 mL, 3 mL, Intravenous, Q12H, Skeet Latch, MD   Allergies  Allergen Reactions   Statins     CK elevation   Sulfa Antibiotics Rash   Sulfamethoxazole-Trimethoprim Rash     Review of Systems  Constitutional: Negative.   HENT: Negative.    Eyes: Negative.   Respiratory: Negative.    Cardiovascular: Negative.   Gastrointestinal: Negative.   Skin:  Positive for rash.     Today's Vitals   01/04/22 1314  BP: 120/60  Pulse: (!) 104  Temp: 98.5 F (36.9 C)  Weight: 243 lb (110.2 kg)  Height: 5' 3.2" (1.605 m)  PainSc: 0-No pain   Body mass index is 42.77 kg/m.  Wt Readings from Last 3 Encounters:  01/04/22 243 lb (110.2 kg)  12/08/21 238 lb 3.2 oz (108 kg)  12/06/21 238  lb 14.4 oz (108.4 kg)     Objective:  Physical Exam Vitals and nursing note reviewed.  Constitutional:      Appearance: Normal appearance.  HENT:     Head: Normocephalic and atraumatic.     Nose:     Comments: Masked     Mouth/Throat:     Comments: Masked  Eyes:     Extraocular Movements: Extraocular movements intact.  Cardiovascular:     Rate and Rhythm: Normal rate and regular rhythm.     Heart sounds: Normal heart sounds.  Pulmonary:     Effort: Pulmonary effort is normal.     Breath sounds: Normal breath sounds.  Skin:    General: Skin is warm.     Comments: Abscess, posterior right axilla. Overlying erythema, tender, warm to touch  Neurological:     General: No focal deficit present.     Mental Status: She is alert.  Psychiatric:        Mood and Affect: Mood normal.        Behavior: Behavior normal.       Assessment And Plan:     1. Axillary abscess Comments: I will send rx doxycycline, advised to take as prescribed and to complete the full course. If recurs, may need surgical evaluation.  2. Class 3 severe obesity due to excess calories with serious comorbidity and body mass index (BMI) of 40.0 to 44.9 in adult [facility]) Comments: BMI 42. She is encouraged to gradually increase her daily activity since her myalgias have improved with use of MTX.    Patient was given opportunity to ask questions. Patient verbalized understanding of the plan and was able to repeat key elements of the plan. All questions were answered to their satisfaction.   I, [name], MD, have reviewed all documentation for this visit. The documentation on 01/04/22 for the exam, diagnosis, procedures, and orders are all accurate and complete.   IF YOU HAVE BEEN REFERRED TO A SPECIALIST, IT MAY TAKE 1-2 WEEKS TO SCHEDULE/PROCESS THE REFERRAL. IF YOU HAVE NOT HEARD FROM US/SPECIALIST IN TWO WEEKS, PLEASE GIVE [place] A CALL AT [phone].   THE PATIENT IS ENCOURAGED TO PRACTICE SOCIAL  DISTANCING DUE TO THE COVID-19 PANDEMIC.

## 2022-01-04 NOTE — Patient Instructions (Signed)
Skin Abscess  A skin abscess is an infected area of your skin that contains pus and other material. An abscess can happen in any part of your body. Some abscesses break open (rupture) on their own. Most continue to get worse unless they are treated. The infection can spread deeper into the body and into your blood, which can make you feel sick. A skin abscess is caused by germs that enter the skin through a cut or scrape. It can also be caused by blocked oil and sweat glands or infected hair follicles. This condition is usually treated by: Draining the pus. Taking antibiotic medicines. Placing a warm, wet washcloth over the abscess. Follow these instructions at home: Medicines  Take over-the-counter and prescription medicines only as told by your doctor. If you were prescribed an antibiotic medicine, take it as told by your doctor. Do not stop taking the antibiotic even if you start to feel better. Abscess care  If you have an abscess that has not drained, place a warm, clean, wet washcloth over the abscess several times a day. Do this as told by your doctor. Follow instructions from your doctor about how to take care of your abscess. Make sure you: Cover the abscess with a bandage (dressing). Change your bandage or gauze as told by your doctor. Wash your hands with soap and water before you change the bandage or gauze. If you cannot use soap and water, use hand sanitizer. Check your abscess every day for signs that the infection is getting worse. Check for: More redness, swelling, or pain. More fluid or blood. Warmth. More pus or a bad smell. General instructions To avoid spreading the infection: Do not share personal care items, towels, or hot tubs with others. Avoid making skin-to-skin contact with other people. Keep all follow-up visits as told by your doctor. This is important. Contact a doctor if: You have more redness, swelling, or pain around your abscess. You have more fluid  or blood coming from your abscess. Your abscess feels warm when you touch it. You have more pus or a bad smell coming from your abscess. Your muscles ache. You feel sick. Get help right away if: You have very bad (severe) pain. You see red streaks on your skin spreading away from the abscess. You see redness that spreads quickly. You have a fever or chills. Summary A skin abscess is an infected area of your skin that contains pus and other material. The abscess is caused by germs that enter the skin through a cut or scrape. It can also be caused by blocked oil and sweat glands or infected hair follicles. Follow your doctor's instructions on caring for your abscess, taking medicines, preventing infections, and keeping follow-up visits. This information is not intended to replace advice given to you by your health care provider. Make sure you discuss any questions you have with your health care provider. Document Revised: 05/05/2021 Document Reviewed: 11/08/2020 Elsevier Patient Education  2023 Elsevier Inc.  

## 2022-01-07 ENCOUNTER — Other Ambulatory Visit: Payer: Self-pay | Admitting: Internal Medicine

## 2022-01-07 MED ORDER — DOXYCYCLINE HYCLATE 100 MG PO TABS: 100.0000 mg | ORAL_TABLET | Freq: Two times a day (BID) | ORAL | 0 refills | Status: DC

## 2022-01-10 ENCOUNTER — Other Ambulatory Visit: Payer: Self-pay | Admitting: Internal Medicine

## 2022-01-10 ENCOUNTER — Encounter: Payer: Medicare Other | Admitting: Physical Therapy

## 2022-01-11 ENCOUNTER — Other Ambulatory Visit: Payer: Self-pay

## 2022-01-11 DIAGNOSIS — G4733 Obstructive sleep apnea (adult) (pediatric): Secondary | ICD-10-CM | POA: Diagnosis not present

## 2022-01-11 MED ORDER — ACCU-CHEK GUIDE VI STRP: ORAL_STRIP | 3 refills | Status: DC

## 2022-01-12 ENCOUNTER — Other Ambulatory Visit: Payer: Self-pay

## 2022-01-12 DIAGNOSIS — I10 Essential (primary) hypertension: Secondary | ICD-10-CM

## 2022-01-12 DIAGNOSIS — H47323 Drusen of optic disc, bilateral: Secondary | ICD-10-CM | POA: Diagnosis not present

## 2022-01-12 DIAGNOSIS — E119 Type 2 diabetes mellitus without complications: Secondary | ICD-10-CM | POA: Diagnosis not present

## 2022-01-12 DIAGNOSIS — E1165 Type 2 diabetes mellitus with hyperglycemia: Secondary | ICD-10-CM

## 2022-01-12 DIAGNOSIS — E1169 Type 2 diabetes mellitus with other specified complication: Secondary | ICD-10-CM

## 2022-01-12 DIAGNOSIS — Z79899 Other long term (current) drug therapy: Secondary | ICD-10-CM | POA: Diagnosis not present

## 2022-01-12 DIAGNOSIS — E785 Hyperlipidemia, unspecified: Secondary | ICD-10-CM

## 2022-01-12 DIAGNOSIS — H2513 Age-related nuclear cataract, bilateral: Secondary | ICD-10-CM | POA: Diagnosis not present

## 2022-01-12 LAB — HM DIABETES EYE EXAM

## 2022-01-12 MED ORDER — ACCU-CHEK SOFTCLIX LANCETS MISC: 5 refills | Status: DC

## 2022-01-17 ENCOUNTER — Telehealth: Payer: Self-pay

## 2022-01-17 ENCOUNTER — Encounter: Payer: Self-pay | Admitting: Orthopaedic Surgery

## 2022-01-17 ENCOUNTER — Ambulatory Visit: Payer: Medicare Other | Admitting: Orthopaedic Surgery

## 2022-01-17 VITALS — BP 116/68 | HR 97 | Ht 63.2 in | Wt 237.2 lb

## 2022-01-17 DIAGNOSIS — M359 Systemic involvement of connective tissue, unspecified: Secondary | ICD-10-CM | POA: Diagnosis not present

## 2022-01-17 DIAGNOSIS — M549 Dorsalgia, unspecified: Secondary | ICD-10-CM | POA: Diagnosis not present

## 2022-01-17 DIAGNOSIS — M48062 Spinal stenosis, lumbar region with neurogenic claudication: Secondary | ICD-10-CM

## 2022-01-17 DIAGNOSIS — M609 Myositis, unspecified: Secondary | ICD-10-CM | POA: Diagnosis not present

## 2022-01-17 DIAGNOSIS — Z79899 Other long term (current) drug therapy: Secondary | ICD-10-CM | POA: Diagnosis not present

## 2022-01-17 DIAGNOSIS — R768 Other specified abnormal immunological findings in serum: Secondary | ICD-10-CM | POA: Diagnosis not present

## 2022-01-17 NOTE — Progress Notes (Signed)
Office Visit Note   Patient: Michelle Hicks           Date of Birth: 1950-04-13           MRN: [id number] Visit Date: 01/17/2022              Requested by: Glendale Chard, [address] [place],  [place] [address] PCP: Glendale Chard, MD   Assessment & Plan: Visit Diagnoses:  1. Spinal stenosis of lumbar region with neurogenic claudication     Plan: Recheck 3 months.  When she reaches her target weight then we can proceed with a lumbar MRI scanning imaging.  Her A1c is less than 7.5 we discussed requirements to consider spine fusion.  Questions elicited and answered.  On return visit we will weigh patient and obtain the lateral flexion-extension lumbar images.  Follow-Up Instructions: Return in about 3 months (around 04/18/2022).   Orders:  No orders of the defined types were placed in this encounter.  No orders of the defined types were placed in this encounter.     Procedures: No procedures performed   Clinical Data: No additional findings.   Subjective: Chief Complaint  Patient presents with   Lower Back - Pain, Follow-up    HPI 71 year old female with ongoing problems with lumbar spinal stenosis and degenerative anterolisthesis at L4-5 with intact pars.  She has spinal stenosis and can walk 1 time around the track which is 500 feet and has to stop and sit and then she can repeat the distance.  Pain with standing after 5 to 10 minutes.  She has been working on trying to lose some weight and her goal weight is 226 we will get her BMI below 40.  Review of Systems no fever chills all other systems noncontributory she does have type 2 diabetes with elevated A1c in the past but her last A1c was improved at 7.2.   Objective: Vital Signs: BP 116/68   Pulse 97   Ht 5' 3.2" (1.605 m)   Wt 237 lb 3.2 oz (107.6 kg)   BMI 41.75 kg/m   Physical Exam Constitutional:      Appearance: She is well-developed.  HENT:     Head: Normocephalic.     Right Ear:  External ear normal.     Left Ear: External ear normal. There is no impacted cerumen.  Eyes:     Pupils: Pupils are equal, round, and reactive to light.  Neck:     Thyroid: No thyromegaly.     Trachea: No tracheal deviation.  Cardiovascular:     Rate and Rhythm: Normal rate.  Pulmonary:     Effort: Pulmonary effort is normal.  Abdominal:     Palpations: Abdomen is soft.  Musculoskeletal:     Cervical back: No rigidity.  Skin:    General: Skin is warm and dry.  Neurological:     Mental Status: She is alert and oriented to person, place, and time.  Psychiatric:        Behavior: Behavior normal.     Ortho Exam patient is able to heel and toe walk she has some sciatic notch tenderness skin over back is normal.  Anterior tib gastrocsoleus is intact she is able to heel and toe walk.  Specialty Comments:  No specialty comments available.  Imaging: No results found.   PMFS History: Patient Active Problem List   Diagnosis Date Noted   Diabetes mellitus ([place]) 12/08/2021   Spinal stenosis of lumbar region  12/06/2021   Right flank pain 08/30/2021   Acute right-sided low back pain 08/30/2021   Normocytic anemia 08/30/2021   Mixed hyperlipidemia 07/17/2021   Thyroid nodule 07/17/2021   Pulmonary nodule 07/17/2021   Myositis 07/17/2021   Pericardial calcification 07/12/2021   Tachycardia 05/30/2021   Exertional dyspnea 05/30/2021   OSA on CPAP 05/26/2021   GERD with apnea 05/26/2021   Attention deficit hyperactivity disorder, predominantly inattentive type 12/06/2020   History of anemia 12/06/2020   Hyperglycemia due to type 2 diabetes mellitus ([place]) 12/06/2020   Hypothyroidism 12/06/2020   Menopausal symptom 12/06/2020   Non-toxic goiter 12/06/2020   Pure hypercholesterolemia 12/06/2020   Positive ANA (antinuclear antibody) 12/06/2020   Pain in left knee 12/06/2020   Chronic insomnia 04/26/2020   Snoring 04/26/2020   Class 3 severe obesity due to excess calories with  serious comorbidity and body mass index (BMI) of 40.0 to 44.9 in adult ([place]) 03/18/2019   Essential hypertension, benign 11/13/2017   Gastroesophageal reflux disease without esophagitis 11/13/2017   Hypertension 09/04/2017   Type II diabetes mellitus, uncontrolled 09/04/2017   S/P abdominal hysterectomy and left salpingo-oophorectomy 08/18/1994   Past Medical History:  Diagnosis Date   Depression    DM (diabetes mellitus) ([place])    Exertional dyspnea 05/30/2021   HTN (hypertension)    Pericardial calcification 07/12/2021   Tachycardia 05/30/2021    Family History  Problem Relation Age of Onset   Diabetes Mother    Pancreatic cancer Mother    Hypertension Father    Heart attack Father 69   Breast cancer Neg Hx     Past Surgical History:  Procedure Laterality Date   ABDOMINAL HYSTERECTOMY     RIGHT/LEFT HEART CATH AND CORONARY ANGIOGRAPHY N/A 07/22/2021   Procedure: RIGHT/LEFT HEART CATH AND CORONARY ANGIOGRAPHY;  Surgeon: Sherren Mocha, MD;  Location: [facility];  Service: Cardiovascular;  Laterality: N/A;   TONSILLECTOMY Bilateral 1967   Social History   Occupational History   Occupation: retired  Tobacco Use   Smoking status: Former    Packs/day: 0.75    Years: 20.00    Total pack years: 15.00    Types: Cigarettes    Quit date: 2002    Years since quitting: 21.9    Passive exposure: Past   Smokeless tobacco: Never  Vaping Use   Vaping Use: Never used  Substance and Sexual Activity   Alcohol use: Yes    Comment: socially    Drug use: No   Sexual activity: Not Currently    Partners: Male    Birth control/protection: Surgical

## 2022-01-17 NOTE — Chronic Care Management (AMB) (Signed)
Faxed 2024 re-enrollment application to [employer] patient assistance for Ozempic.   Pattricia Boss, [place] Pharmacist Assistant [phone]

## 2022-01-18 ENCOUNTER — Other Ambulatory Visit: Payer: Medicare Other

## 2022-01-19 ENCOUNTER — Encounter: Payer: Self-pay | Admitting: *Deleted

## 2022-01-19 ENCOUNTER — Encounter (HOSPITAL_BASED_OUTPATIENT_CLINIC_OR_DEPARTMENT_OTHER): Payer: Self-pay | Admitting: Cardiovascular Disease

## 2022-01-19 ENCOUNTER — Ambulatory Visit: Payer: Medicare Other | Attending: Cardiovascular Disease

## 2022-01-19 ENCOUNTER — Ambulatory Visit (HOSPITAL_BASED_OUTPATIENT_CLINIC_OR_DEPARTMENT_OTHER): Payer: Medicare Other | Admitting: Cardiovascular Disease

## 2022-01-19 VITALS — BP 126/68 | HR 85 | Ht 63.0 in | Wt 242.5 lb

## 2022-01-19 DIAGNOSIS — Z5181 Encounter for therapeutic drug level monitoring: Secondary | ICD-10-CM

## 2022-01-19 DIAGNOSIS — I10 Essential (primary) hypertension: Secondary | ICD-10-CM | POA: Diagnosis not present

## 2022-01-19 DIAGNOSIS — G4733 Obstructive sleep apnea (adult) (pediatric): Secondary | ICD-10-CM | POA: Diagnosis not present

## 2022-01-19 DIAGNOSIS — R002 Palpitations: Secondary | ICD-10-CM

## 2022-01-19 DIAGNOSIS — E7800 Pure hypercholesterolemia, unspecified: Secondary | ICD-10-CM | POA: Diagnosis not present

## 2022-01-19 DIAGNOSIS — I318 Other specified diseases of pericardium: Secondary | ICD-10-CM

## 2022-01-19 HISTORY — DX: Palpitations: R00.2

## 2022-01-19 NOTE — Patient Instructions (Signed)
Medication Instructions:  Your physician recommends that you continue on your current medications as directed. Please refer to the Current Medication list given to you today.   *If you need a refill on your cardiac medications before your next appointment, please call your pharmacy*  Lab Work: FASTING LP 2-3 [place]   If you have labs (blood work) drawn today and your tests are completely normal, you will receive your results only by: [place] (if you have MyChart) OR A paper copy in the mail If you have any lab test that is abnormal or we need to change your treatment, we will call you to review the results.  Testing/Procedures: Your physician has recommended that you wear an event monitor. Event monitors are medical devices that record the heart's electrical activity. Doctors most often [place] these monitors to diagnose arrhythmias. Arrhythmias are problems with the speed or rhythm of the heartbeat. The monitor is a small, portable device. You can wear one while you do your normal daily activities. This is usually used to diagnose what is causing palpitations/syncope (passing out). THIS WILL BE MAILED TO YOU   Follow-Up: At [facility], you and your health needs are our priority.  As part of our continuing mission to provide you with exceptional heart care, we have created designated Provider Care Teams.  These Care Teams include your primary Cardiologist (physician) and Advanced Practice Providers (APPs -  Physician Assistants and Nurse Practitioners) who all work together to provide you with the care you need, when you need it.  We recommend signing up for the patient portal called "MyChart".  Sign up information is provided on this After Visit Summary.  MyChart is used to connect with patients for Virtual Visits (Telemedicine).  Patients are able to view lab/test results, encounter notes, upcoming appointments, etc.  Non-urgent messages can be sent to  your provider as well.   To learn more about what you can do with MyChart, go to [email or web address].    Your next appointment:   6-8 week(s)  The format for your next appointment:   In Person  Provider:   Skeet Latch, MD or Laurann Montana, NP   Other Instructions  [employer] Cardiac Event Monitor Instructions Your physician has requested you wear your cardiac event monitor for _30__ days, (1-30). [employer] may call or text to confirm a shipping address. The monitor will be sent to a land address via UPS. [employer] will not ship a monitor to a PO BOX. It typically takes 3-5 days to receive your monitor after it has been enrolled. [employer] will assist with USPS tracking if your package is delayed. The telephone number for [employer] is [phone]. Once you have received your monitor, please review the enclosed instructions. Instruction tutorials can also be viewed under help and settings on the enclosed cell phone. Your monitor has already been registered assigning a specific monitor serial # to you.  Applying the monitor Remove cell phone from case and turn it on. The cell phone works as Dealer and needs to be within [employer] of you at all times. The cell phone will need to be charged on a daily basis. We recommend you plug the cell phone into the enclosed charger at your bedside table every night.  Monitor batteries: You will receive two monitor batteries labelled #1 and #2. These are your recorders. Plug battery #2 onto the second connection on the enclosed charger. Keep one battery on the charger at all times. This  will keep the monitor battery deactivated. It will also keep it fully charged for when you need to switch your monitor batteries. A small light will be blinking on the battery emblem when it is charging. The light on the battery emblem will remain on when the battery is fully charged.  Open package of a Monitor strip. Insert battery #1 into  black hood on strip and gently squeeze monitor battery onto connection as indicated in instruction booklet. Set aside while preparing skin.  Choose location for your strip, vertical or horizontal, as indicated in the instruction booklet. Shave to remove all hair from location. There cannot be any lotions, oils, powders, or colognes on skin where monitor is to be applied. Wipe skin clean with enclosed Saline wipe. Dry skin completely.  Peel paper labeled #1 off the back of the Monitor strip exposing the adhesive. Place the monitor on the chest in the vertical or horizontal position shown in the instruction booklet. One arrow on the monitor strip must be pointing upward. Carefully remove paper labeled #2, attaching remainder of strip to your skin. Try not to create any folds or wrinkles in the strip as you apply it.  Firmly press and release the circle in the center of the monitor battery. You will hear a small beep. This is turning the monitor battery on. The heart emblem on the monitor battery will light up every 5 seconds if the monitor battery in turned on and connected to the patient securely. Do not push and hold the circle down as this turns the monitor battery off. The cell phone will locate the monitor battery. A screen will appear on the cell phone checking the connection of your monitor strip. This may read poor connection initially but change to good connection within the next minute. Once your monitor accepts the connection you will hear a series of 3 beeps followed by a climbing crescendo of beeps. A screen will appear on the cell phone showing the two monitor strip placement options. Touch the picture that demonstrates where you applied the monitor strip.  Your monitor strip and battery are waterproof. You are able to shower, bathe, or swim with the monitor on. They just ask you do not submerge deeper than 3 feet underwater. We recommend removing the monitor if you are swimming in  a lake, river, or ocean.  Your monitor battery will need to be switched to a fully charged monitor battery approximately once a week. The cell phone will alert you of an action which needs to be made.  On the cell phone, tap for details to reveal connection status, monitor battery status, and cell phone battery status. The green dots indicates your monitor is in good status. A red dot indicates there is something that needs your attention.  To record a symptom, click the circle on the monitor battery. In 30-60 seconds a list of symptoms will appear on the cell phone. Select your symptom and tap save. Your monitor will record a sustained or significant arrhythmia regardless of you clicking the button. Some patients do not feel the heart rhythm irregularities. [employer] will notify us of any serious or critical events.  Refer to instruction booklet for instructions on switching batteries, changing strips, the Do not disturb or Pause features, or any additional questions.  Call [employer] at [phone], to confirm your monitor is transmitting and record your baseline. They will answer any questions you may have regarding the monitor instructions at that time.  Returning the monitor to  [employer] Place all equipment back into blue box. Peel off strip of paper to expose adhesive and close box securely. There is a prepaid UPS shipping label on this box. Drop in a UPS drop box, or at a UPS facility like [employer]. You may also contact [employer] to arrange UPS to pick up monitor package at your home.

## 2022-01-19 NOTE — Progress Notes (Addendum)
Patient enrolled for [employer] to ship a 30 day cardiac event monitor bu [employer] to her land address on file.

## 2022-01-19 NOTE — Progress Notes (Signed)
Cardiology Office Note   Date:  01/19/2022   ID:  Michelle Hicks, DOB October 29, 1950, MRN [id number]  PCP:  Glendale Chard, MD  Cardiologist:  None  Electrophysiologist:  None   Evaluation Performed:  Follow-Up Visit  Chief Complaint:  shortness of breath/Palpitations  History of Present Illness:    Michelle Hicks is a 71 y.o. female with a hx of hypertension, diabetes mellitus, OSA, and depression, here for follow-up of frequent heart palpitations. She saw Dr. Baird Cancer 03/2021. She was referred for an echocardiogram 05/2021 that revealed LVEF 60 to 65% with mild LVH and moderate hypertrophy of the basal septum.  She had normal diastolic function.  She had a coronary CTA 05/2021 that revealed normal coronary arteries.  However there is some thickening and calcification of her pericardium.  There was concern for elevated pulmonary pressures.  The study showed signs of GERD and a small pulmonary nodule.  She has had some issues with muscle stiffness but did not improve after stopping her statin. She had a left and right heart cath 07/2021, that revealed normal coronaries and no evidence of constriction.    Last appointment, she had stated her breathing lately has been stable. She was generally feeling fine other than some acid reflux. She had also reported a pre-syncopal episode of low blood pressure. She had lost 10 lbs and planned to continue with her weight loss.  Today, the patient states that she has been feeling tired recently. She had noticed her heartrate going above 140 BPM at rest.  She felt that her hearbeat was regular but fast, and she felt pre-syncopal. She had just been sitting at the time. This has happened 4-5 times in the past few weeks since Thanksgiving, the episodes last for up to 5 minutes. She states that her back has been bothering her and she hasn't been able to get exercise recently. Her blood pressure has been well controlled, and is usually around 110/60 at home. She states  that at the doctors office she is tachycardic up to 160. She has been compliant with her CPAP, she had similar symptoms two years ago but with  much lower frequency of around once a year. She denies any chest pain, shortness of breath, or peripheral edema. No  headaches, syncope, orthopnea, or PND.   Past Medical History:  Diagnosis Date   Depression    DM (diabetes mellitus) ([place])    Exertional dyspnea 05/30/2021   HTN (hypertension)    Palpitations 01/19/2022   Pericardial calcification 07/12/2021   Tachycardia 05/30/2021    Past Surgical History:  Procedure Laterality Date   ABDOMINAL HYSTERECTOMY     RIGHT/LEFT HEART CATH AND CORONARY ANGIOGRAPHY N/A 07/22/2021   Procedure: RIGHT/LEFT HEART CATH AND CORONARY ANGIOGRAPHY;  Surgeon: Sherren Mocha, MD;  Location: [facility];  Service: Cardiovascular;  Laterality: N/A;   TONSILLECTOMY Bilateral 1967    Current Medications: Current Meds  Medication Sig   Accu-Chek Softclix Lancets lancets Use as instructed to check blood sugars twice daily E11.69   amLODipine (NORVASC) 5 MG tablet Take 1 tablet (5 mg total) by mouth daily.   aspirin EC 81 MG tablet Take 81 mg by mouth daily. Some times   Blood Glucose Monitoring Suppl (ACCU-CHEK GUIDE) w/Device KIT Use to check blood sugars twice daily E11.69   doxycycline (VIBRA-TABS) 100 MG tablet Take 1 tablet (100 mg total) by mouth 2 (two) times daily.   Evolocumab (REPATHA SURECLICK) 268 MG/ML SOAJ Inject 140 mg into  the skin every 14 (fourteen) days.   fluticasone (FLONASE) 50 MCG/ACT nasal spray Place 1 spray into both nostrils daily.   folic acid (FOLVITE) 1 MG tablet Take 1 mg by mouth daily.   glucose blood (ACCU-CHEK GUIDE) test strip USE TO CHECK BLOOD SUGAR TWICE DAILY AS DIRECTED   hydrochlorothiazide (HYDRODIURIL) 25 MG tablet TAKE 1 TABLET BY MOUTH  DAILY   hydroxychloroquine (PLAQUENIL) 200 MG tablet Take 200 mg by mouth daily.   levothyroxine (SYNTHROID) 50 MCG tablet  Take 50 mcg by mouth daily.    methotrexate (RHEUMATREX) 2.5 MG tablet Take 10 mg by mouth once a week.   Multiple Vitamin (MULTIVITAMIN) tablet Take 1 tablet by mouth daily.   omeprazole (PRILOSEC) 40 MG capsule TAKE 1 CAPSULE BY MOUTH  DAILY BEFORE A MEAL   predniSONE (DELTASONE) 10 MG tablet Take 10 mg by mouth daily with breakfast. Taper   Semaglutide, 1 MG/DOSE, (OZEMPIC, 1 MG/DOSE,) 2 MG/1.5ML SOPN Inject 1 mg into the skin once a week.   telmisartan (MICARDIS) 80 MG tablet TAKE 1 TABLET BY MOUTH  DAILY   traZODone (DESYREL) 50 MG tablet TAKE 1 TABLET BY MOUTH AT  BEDTIME AS NEEDED FOR SLEEP   Current Facility-Administered Medications for the 01/19/22 encounter (Office Visit) with Skeet Latch, MD  Medication   sodium chloride flush (NS) 0.9 % injection 3 mL     Allergies:   Statins, Sulfa antibiotics, and Sulfamethoxazole-trimethoprim   Social History   Socioeconomic History   Marital status: Married    Spouse name: Not on file   Number of children: Not on file   Years of education: Not on file   Highest education level: Not on file  Occupational History   Occupation: retired  Tobacco Use   Smoking status: Former    Packs/day: 0.75    Years: 20.00    Total pack years: 15.00    Types: Cigarettes    Quit date: 2002    Years since quitting: 21.9    Passive exposure: Past   Smokeless tobacco: Never  Vaping Use   Vaping Use: Never used  Substance and Sexual Activity   Alcohol use: Yes    Comment: socially    Drug use: No   Sexual activity: Not Currently    Partners: Male    Birth control/protection: Surgical  Other Topics Concern   Not on file  Social History Narrative   Not on file   Social Determinants of Health   Financial Resource Strain: High Risk (04/29/2021)   Overall Financial Resource Strain (CARDIA)    Difficulty of Paying Living Expenses: Very hard  Food Insecurity: No Food Insecurity (03/31/2021)   Hunger Vital Sign    Worried About Running Out  of Food in the Last Year: Never true    [place] in the Last Year: Never true  Transportation Needs: No Transportation Needs (03/31/2021)   PRAPARE - Hydrologist (Medical): No    Lack of Transportation (Non-Medical): No  Physical Activity: Inactive (03/31/2021)   Exercise Vital Sign    Days of Exercise per Week: 0 days    Minutes of Exercise per Session: 0 min  Stress: No Stress Concern Present (03/31/2021)   [place]    Feeling of Stress : Only a little  Social Connections: Not on file     Family History: The patient's family history includes Diabetes in her mother; Heart attack (age  of onset: 8) in her father; Hypertension in her father; Pancreatic cancer in her mother. There is no history of Breast cancer.  ROS:   Please see the history of present illness.    (+)Lightheadedness (+)Palpitations All other systems reviewed and are negative.  EKGs/Labs/Other Studies Reviewed:    The following studies were reviewed today:  Left LE Venous Doppler 07/30/2020: Summary:   LEFT:  - There is no evidence of deep vein thrombosis in the lower extremity.  - There is no evidence of superficial venous thrombosis.     - No cystic structure found in the popliteal fossa.  Echo 06/09/21: IMPRESSIONS    1. Left ventricular ejection fraction, by estimation, is 60 to 65%. Left  ventricular ejection fraction by 3D volume is 62 %. The left ventricle has  normal function. The left ventricle has no regional wall motion  abnormalities. There is mild concentric  left ventricular hypertrophy and moderate basal septal hypertrophy.   2. Right ventricular systolic function is normal. The right ventricular  size is mildly enlarged. Tricuspid regurgitation signal is inadequate for  assessing PA pressure.   3. Right atrial size was mildly dilated.   4. The mitral valve is normal in structure. No  evidence of mitral valve  regurgitation. No evidence of mitral stenosis.   5. The aortic valve is normal in structure. There is moderate  calcification of the aortic valve. Aortic valve regurgitation is not  visualized. Aortic valve sclerosis/calcification is present, without any  evidence of aortic stenosis. Aortic valve area,  by VTI measures 2.20 cm. Aortic valve mean gradient measures 6.0 mmHg.  Aortic valve Vmax measures 1.58 m/s.   6. The inferior vena cava is normal in size with greater than 50%  respiratory variability, suggesting right atrial pressure of 3 mmHg.   Coronary CTA  06/11/21: IMPRESSION: 1. Tortuous coronary arteries with no evidence of CAD, CADRADS = 0. Image quality degraded by reduced signal to noise ratio, no definite proximal or mid plaque or coronary calcifications seen.   2. Coronary calcium score of 0.   3. Normal coronary origins with left dominance.   4. Scattered pericardial calcifications seen circumferentially. For indication of dyspnea on exertion, consider evaluation for constrictive pericarditis.   5. Mild dilation of main pulmonary artery, 31 mm, may suggest increased pulmonary pressure.   LHC/RHC 07/2021: Angiographically normal coronary arteries (left dominant) Normal/low right and left heart pressures No ventricular interdependence with simultaneous LV/RV pressure recording   Normal study - no hemodynamic evidence for constrictive pericarditis     EKG:   EKG is personally reviewed. 01/19/2022: The EKG was not ordered today. 05/30/2021: Sinus rhythm. Rate 98 bpm.  Recent Labs: 06/06/2021: TSH 0.876 10/18/2021: Hemoglobin 12.1; Platelets 299 01/03/2022: ALT 27; BUN 13; Creatinine, Ser 0.82; Potassium 4.1; Sodium 136   Recent Lipid Panel    Component Value Date/Time   CHOL 201 (H) 01/03/2022 1040   TRIG 67 01/03/2022 1040   HDL 81 01/03/2022 1040   CHOLHDL 2.5 01/03/2022 1040   LDLCALC 108 (H) 01/03/2022 1040    Physical Exam:     VS:  BP 126/68   Pulse 85   Ht _0  (1.6 m)   Wt 242 lb 8 oz (110 kg)   BMI 42.96 kg/m  , BMI Body mass index is 42.96 kg/m. GENERAL:  Well appearing HEENT: Pupils equal round and reactive, fundi not visualized, oral mucosa unremarkable NECK:  No jugular venous distention, waveform within normal limits, carotid upstroke brisk  and symmetric, no bruits, no thyromegaly LUNGS:  Clear to auscultation bilaterally HEART:  RRR.  PMI not displaced or sustained,S1 and S2 within normal limits, no S3, no S4, no clicks, no rubs, no murmurs ABD:  Flat, positive bowel sounds normal in frequency in pitch, no bruits, no rebound, no guarding, no midline pulsatile mass, no hepatomegaly, no splenomegaly EXT:  2 plus pulses throughout, no edema, no cyanosis no clubbing SKIN:  No rashes no nodules NEURO:  Cranial nerves II through XII grossly intact, motor grossly intact throughout PSYCH:  Cognitively intact, oriented to person place and time  ASSESSMENT:    1. Essential hypertension, benign   2. Pericardial calcification   3. OSA on CPAP   4. Palpitations   5. Pure hypercholesterolemia   6. Therapeutic drug monitoring     PLAN:    Essential hypertension, benign Blood pressure has been well-controlled.  At home it has been 110s.  Continue amlodipine, HCTZ, and telmisartan.  We did discuss trying to use water exercises given that her back limits her from doing other activities.  Pericardial calcification No evidence of tamponade.  She has no heart failure symptoms today.  Continue to monitor.  OSA on CPAP She has been using her CPAP regularly.  Palpitations Sudden onset of palpitations in the last month.  She has had multiple episodes lasting for several minutes at a time.  There are also associated symptoms of presyncope.  Recent labs have shown normal blood counts, electrolytes, and thyroid function.  She is using her CPAP as above.  Will get a 30-day monitor to assess.  Pure  hypercholesterolemia Lipids are uncontrolled.  She was started on Repatha.  Repeat lipids and a CMP in 2 to 3 months.   Disposition: Follow Up: FU with Acen Craun C. Oval Linsey, MD, [facility] in 6-8 weeks to review cardiac monitoring.  Medication Adjustments/Labs and Tests Ordered: Current medicines are reviewed at length with the patient today.  Concerns regarding medicines are outlined above.   Orders Placed This Encounter  Procedures   Lipid panel   CARDIAC EVENT MONITOR   No orders of the defined types were placed in this encounter.  Patient Instructions  Medication Instructions:  Your physician recommends that you continue on your current medications as directed. Please refer to the Current Medication list given to you today.   *If you need a refill on your cardiac medications before your next appointment, please call your pharmacy*  Lab Work: FASTING LP 2-3 [place]   If you have labs (blood work) drawn today and your tests are completely normal, you will receive your results only by: [place] (if you have MyChart) OR A paper copy in the mail If you have any lab test that is abnormal or we need to change your treatment, we will call you to review the results.  Testing/Procedures: Your physician has recommended that you wear an event monitor. Event monitors are medical devices that record the heart's electrical activity. Doctors most often [place] these monitors to diagnose arrhythmias. Arrhythmias are problems with the speed or rhythm of the heartbeat. The monitor is a small, portable device. You can wear one while you do your normal daily activities. This is usually used to diagnose what is causing palpitations/syncope (passing out). THIS WILL BE MAILED TO YOU   Follow-Up: At [facility], you and your health needs are our priority.  As part of our continuing mission to provide you with exceptional heart care, we have  created designated Provider  Care Teams.  These Care Teams include your primary Cardiologist (physician) and Advanced Practice Providers (APPs -  Physician Assistants and Nurse Practitioners) who all work together to provide you with the care you need, when you need it.  We recommend signing up for the patient portal called "MyChart".  Sign up information is provided on this After Visit Summary.  MyChart is used to connect with patients for Virtual Visits (Telemedicine).  Patients are able to view lab/test results, encounter notes, upcoming appointments, etc.  Non-urgent messages can be sent to your provider as well.   To learn more about what you can do with MyChart, go to [email or web address].    Your next appointment:   6-8 week(s)  The format for your next appointment:   In Person  Provider:   Skeet Latch, MD or Laurann Montana, NP   Other Instructions  [employer] Cardiac Event Monitor Instructions Your physician has requested you wear your cardiac event monitor for _30__ days, (1-30). [employer] may call or text to confirm a shipping address. The monitor will be sent to a land address via UPS. [employer] will not ship a monitor to a PO BOX. It typically takes 3-5 days to receive your monitor after it has been enrolled. [employer] will assist with USPS tracking if your package is delayed. The telephone number for [employer] is [phone]. Once you have received your monitor, please review the enclosed instructions. Instruction tutorials can also be viewed under help and settings on the enclosed cell phone. Your monitor has already been registered assigning a specific monitor serial # to you.  Applying the monitor Remove cell phone from case and turn it on. The cell phone works as Dealer and needs to be within [employer] of you at all times. The cell phone will need to be charged on a daily basis. We recommend you plug the cell phone into the enclosed charger at your bedside table every  night.  Monitor batteries: You will receive two monitor batteries labelled #1 and #2. These are your recorders. Plug battery #2 onto the second connection on the enclosed charger. Keep one battery on the charger at all times. This will keep the monitor battery deactivated. It will also keep it fully charged for when you need to switch your monitor batteries. A small light will be blinking on the battery emblem when it is charging. The light on the battery emblem will remain on when the battery is fully charged.  Open package of a Monitor strip. Insert battery #1 into black hood on strip and gently squeeze monitor battery onto connection as indicated in instruction booklet. Set aside while preparing skin.  Choose location for your strip, vertical or horizontal, as indicated in the instruction booklet. Shave to remove all hair from location. There cannot be any lotions, oils, powders, or colognes on skin where monitor is to be applied. Wipe skin clean with enclosed Saline wipe. Dry skin completely.  Peel paper labeled #1 off the back of the Monitor strip exposing the adhesive. Place the monitor on the chest in the vertical or horizontal position shown in the instruction booklet. One arrow on the monitor strip must be pointing upward. Carefully remove paper labeled #2, attaching remainder of strip to your skin. Try not to create any folds or wrinkles in the strip as you apply it.  Firmly press and release the circle in the center of the monitor battery. You will hear a small beep. This is turning the  monitor battery on. The heart emblem on the monitor battery will light up every 5 seconds if the monitor battery in turned on and connected to the patient securely. Do not push and hold the circle down as this turns the monitor battery off. The cell phone will locate the monitor battery. A screen will appear on the cell phone checking the connection of your monitor strip. This may read poor  connection initially but change to good connection within the next minute. Once your monitor accepts the connection you will hear a series of 3 beeps followed by a climbing crescendo of beeps. A screen will appear on the cell phone showing the two monitor strip placement options. Touch the picture that demonstrates where you applied the monitor strip.  Your monitor strip and battery are waterproof. You are able to shower, bathe, or swim with the monitor on. They just ask you do not submerge deeper than 3 feet underwater. We recommend removing the monitor if you are swimming in a lake, river, or ocean.  Your monitor battery will need to be switched to a fully charged monitor battery approximately once a week. The cell phone will alert you of an action which needs to be made.  On the cell phone, tap for details to reveal connection status, monitor battery status, and cell phone battery status. The green dots indicates your monitor is in good status. A red dot indicates there is something that needs your attention.  To record a symptom, click the circle on the monitor battery. In 30-60 seconds a list of symptoms will appear on the cell phone. Select your symptom and tap save. Your monitor will record a sustained or significant arrhythmia regardless of you clicking the button. Some patients do not feel the heart rhythm irregularities. [employer] will notify us of any serious or critical events.  Refer to instruction booklet for instructions on switching batteries, changing strips, the Do not disturb or Pause features, or any additional questions.  Call [employer] at [phone], to confirm your monitor is transmitting and record your baseline. They will answer any questions you may have regarding the monitor instructions at that time.  Returning the monitor to [place] all equipment back into blue box. Peel off strip of paper to expose adhesive and close box securely. There is a  prepaid UPS shipping label on this box. Drop in a UPS drop box, or at a UPS facility like [employer]. You may also contact [employer] to arrange UPS to pick up monitor package at your home.       I,Coren O'Brien,acting as a Education administrator for [place], MD.,have documented all relevant documentation on the behalf of Skeet Latch, MD,as directed by  Skeet Latch, MD while in the presence of Skeet Latch, MD.  I, [place] Oval Linsey, MD have reviewed all documentation for this visit.  The documentation of the exam, diagnosis, procedures, and orders on 01/19/2022 are all accurate and complete.   Signed, Skeet Latch, MD  01/19/2022 8:16 AM    [place]

## 2022-01-19 NOTE — Assessment & Plan Note (Signed)
Sudden onset of palpitations in the last month.  She has had multiple episodes lasting for several minutes at a time.  There are also associated symptoms of presyncope.  Recent labs have shown normal blood counts, electrolytes, and thyroid function.  She is using her CPAP as above.  Will get a 30-day monitor to assess.

## 2022-01-19 NOTE — Assessment & Plan Note (Signed)
No evidence of tamponade.  She has no heart failure symptoms today.  Continue to monitor.

## 2022-01-19 NOTE — Assessment & Plan Note (Signed)
She has been using her CPAP regularly.

## 2022-01-19 NOTE — Assessment & Plan Note (Signed)
Blood pressure has been well-controlled.  At home it has been 110s.  Continue amlodipine, HCTZ, and telmisartan.  We did discuss trying to use water exercises given that her back limits her from doing other activities.

## 2022-01-19 NOTE — Assessment & Plan Note (Signed)
Lipids are uncontrolled.  She was started on Repatha.  Repeat lipids and a CMP in 2 to 3 months.

## 2022-01-25 ENCOUNTER — Telehealth: Payer: Self-pay

## 2022-01-25 NOTE — Progress Notes (Addendum)
    Called Michelle Hicks, No answer, left message of appointment on 01-27-2022 at 1:30 via telephone visit with Orlando Penner, Pharm D. Notified to have all medications, supplements, blood pressure and/or blood sugar logs available during appointment and to return call if need to reschedule.  01-26-2022: patient called to reschedule 01-27-2022 appointment to January.  Care Gaps: Covid booster overdue Ophthalmology overdue  Star Rating Drug: Ozempic 1 mg- PAP Telmisartan 80 mg- Last filled 01-03-2022 100 DS [facility]. Previous 10-07-2021 100 DS.  Any gaps in medications fill history? No  [place] Pharmacist Assistant [phone]

## 2022-01-26 ENCOUNTER — Ambulatory Visit
Admission: RE | Admit: 2022-01-26 | Discharge: 2022-01-26 | Disposition: A | Discharge status: Home / Self Care | Payer: Medicare Other | Source: Ambulatory Visit | Attending: Endocrinology | Admitting: Endocrinology

## 2022-01-26 DIAGNOSIS — Z5181 Encounter for therapeutic drug level monitoring: Secondary | ICD-10-CM | POA: Diagnosis not present

## 2022-01-26 DIAGNOSIS — R002 Palpitations: Secondary | ICD-10-CM

## 2022-01-26 DIAGNOSIS — E041 Nontoxic single thyroid nodule: Secondary | ICD-10-CM | POA: Diagnosis not present

## 2022-01-26 DIAGNOSIS — E1165 Type 2 diabetes mellitus with hyperglycemia: Secondary | ICD-10-CM

## 2022-01-27 ENCOUNTER — Telehealth: Payer: Medicare Other

## 2022-01-27 DIAGNOSIS — Z1159 Encounter for screening for other viral diseases: Secondary | ICD-10-CM | POA: Diagnosis not present

## 2022-01-27 DIAGNOSIS — Z5181 Encounter for therapeutic drug level monitoring: Secondary | ICD-10-CM | POA: Diagnosis not present

## 2022-01-27 DIAGNOSIS — R002 Palpitations: Secondary | ICD-10-CM | POA: Diagnosis not present

## 2022-01-27 DIAGNOSIS — Z79899 Other long term (current) drug therapy: Secondary | ICD-10-CM | POA: Diagnosis not present

## 2022-01-27 DIAGNOSIS — M609 Myositis, unspecified: Secondary | ICD-10-CM | POA: Diagnosis not present

## 2022-02-01 DIAGNOSIS — R7989 Other specified abnormal findings of blood chemistry: Secondary | ICD-10-CM | POA: Diagnosis not present

## 2022-02-01 DIAGNOSIS — Z79899 Other long term (current) drug therapy: Secondary | ICD-10-CM | POA: Diagnosis not present

## 2022-02-01 DIAGNOSIS — M609 Myositis, unspecified: Secondary | ICD-10-CM | POA: Diagnosis not present

## 2022-02-02 ENCOUNTER — Other Ambulatory Visit: Payer: Self-pay | Admitting: Endocrinology

## 2022-02-02 ENCOUNTER — Other Ambulatory Visit: Payer: Self-pay | Admitting: Rheumatology

## 2022-02-02 DIAGNOSIS — E049 Nontoxic goiter, unspecified: Secondary | ICD-10-CM

## 2022-02-14 ENCOUNTER — Ambulatory Visit: Payer: Self-pay

## 2022-02-14 NOTE — Patient Outreach (Signed)
  Care Coordination   Follow Up Visit Note   02/14/2022 Name: RUTHENE METHVIN MRN: [id number] DOB: 21-Feb-1950  [name] is a 72 y.o. year old female who sees Glendale Chard, MD for primary care. I spoke with  [name] by phone today.  What matters to the patients health and wellness today?  Patient would like to improve her back pain.     Goals Addressed               This Visit's Progress     Patient Stated     I would like to have my abnormal labs evaluated (pt-stated)        Care Coordination Interventions: Evaluation of current treatment plan related to Myositis and patient's adherence to plan as established by provider Determined patient is seeing a new Rheumatologist for second opinion of Polymyositis treatment recommendations Reviewed medications with patient and discussed importance of infection control to help avoid illness due to effects of immunosuppression  Reviewed and discussed next scheduled visit with Rheumatologist scheduled for 04/28/22 Instructed patient to report symptoms of illness promptly to PCP for evaluation and treatment       Other     to improve my back pain        Care Coordination Interventions: Reviewed provider established plan for pain management Counseled on the importance of reporting any/all new or changed pain symptoms or management strategies to pain management provider Advised patient to report to care team affect of pain on daily activities Reviewed with patient benefits of stretching and maintaining daily exercise regimen to help manage pain and improve balance/ROM Determined patient experienced a fall outside of her home with minor knee scraping, she is unsure of the cause of the fall but reports wearing new glasses when the fall occurred Educated patient regarding fall prevention and instructed patient to notify PCP of any/all falls            SDOH assessments and interventions completed:  No     Care  Coordination Interventions:  Yes, provided   Follow up plan: Follow up call scheduled for 05/02/22 '@10'$ :30 AM    Encounter Outcome:  Pt. Visit Completed

## 2022-02-14 NOTE — Patient Instructions (Signed)
Visit Information  Thank you for taking time to visit with me today. Please don't hesitate to contact me if I can be of assistance to you.   Following are the goals we discussed today:   Goals Addressed               This Visit's Progress     Patient Stated     I would like to have my abnormal labs evaluated (pt-stated)        Care Coordination Interventions: Evaluation of current treatment plan related to Myositis and patient's adherence to plan as established by provider Determined patient is seeing a new Rheumatologist for second opinion of Polymyositis treatment recommendations Reviewed medications with patient and discussed importance of infection control to help avoid illness due to effects of immunosuppression  Reviewed and discussed next scheduled visit with Rheumatologist scheduled for 04/28/22 Instructed patient to report symptoms of illness promptly to PCP for evaluation and treatment       Other     to improve my back pain        Care Coordination Interventions: Reviewed provider established plan for pain management Counseled on the importance of reporting any/all new or changed pain symptoms or management strategies to pain management provider Advised patient to report to care team affect of pain on daily activities Reviewed with patient benefits of stretching and maintaining daily exercise regimen to help manage pain and improve balance/ROM Determined patient experienced a fall outside of her home with minor knee scraping, she is unsure of the cause of the fall but reports wearing new glasses when the fall occurred Educated patient regarding fall prevention and instructed patient to notify PCP of any/all falls            Our next appointment is by telephone on 05/02/22 at 10:30 AM  Please call the care guide team at [phone] if you need to cancel or reschedule your appointment.   If you are experiencing a Mental Health or [place] or need  someone to talk to, please call 1-800-273-TALK (toll free, 24 hour hotline)  Patient verbalizes understanding of instructions and care plan provided today and agrees to view in [place]. Active MyChart status and patient understanding of how to access instructions and care plan via MyChart confirmed with patient.     Barb Merino, RN, BSN, CCM Care Management Coordinator [facility] Care Management  Direct Phone: [phone]

## 2022-02-17 ENCOUNTER — Telehealth: Payer: Self-pay

## 2022-02-17 NOTE — Progress Notes (Cosign Needed)
    Called Michelle Hicks, No answer, left message of appointment on 02-21-2022 at 11:00 via telephone visit with Orlando Penner, Pharm D. Notified to have all medications, supplements, blood pressure and/or blood sugar logs available during appointment and to return call if need to reschedule.   [place] Pharmacist Assistant [phone]

## 2022-02-21 NOTE — Progress Notes (Unsigned)
Care Management & Coordination Services Pharmacy Note  02/21/2022 Name:  Michelle Hicks MRN:  [id number] DOB:  December 14, 1950  Summary: ***  Recommendations/Changes made from today's visit: ***  Follow up plan: ***   Subjective: Michelle Hicks is an 72 y.o. year old female who is a primary patient of Glendale Chard, MD.  The care coordination team was consulted for assistance with disease management and care coordination needs.    Engaged with patient by telephone for follow up visit.  Patient Care Team: Glendale Chard, MD as PCP - General (Internal Medicine) Mayford Knife, [facility] (Pharmacist) Rex Kras Claudette Stapler, RN as Branch Management  Recent office visits: ***  Recent consult visits: [facility] visits: None in previous 6 months   Objective:  Lab Results  Component Value Date   CREATININE 0.82 01/03/2022   BUN 13 01/03/2022   EGFR 77 01/03/2022   GFRNONAA 73 03/24/2020   GFRAA 84 03/24/2020   NA 136 01/03/2022   K 4.1 01/03/2022   CALCIUM 9.8 01/03/2022   CO2 20 01/03/2022   GLUCOSE 93 01/03/2022    Lab Results  Component Value Date/Time   HGBA1C 7.2 (H) 12/08/2021 10:33 AM   HGBA1C 7.1 (H) 07/13/2021 10:45 AM   HGBA1C 7.3 09/04/2017 12:00 AM   MICROALBUR 10 07/06/2020 12:47 PM   MICROALBUR 30 03/24/2020 10:27 AM    Last diabetic Eye exam:  Lab Results  Component Value Date/Time   HMDIABEYEEXA No Retinopathy 01/11/2021 12:00 AM    Last diabetic Foot exam: No results found for: "HMDIABFOOTEX"   Lab Results  Component Value Date   CHOL 201 (H) 01/03/2022   HDL 81 01/03/2022   LDLCALC 108 (H) 01/03/2022   TRIG 67 01/03/2022   CHOLHDL 2.5 01/03/2022       Latest Ref Rng & Units 01/03/2022   10:42 AM 11/21/2021    9:05 AM 10/18/2021   12:00 AM  Hepatic Function  Total Protein 6.0 - 8.5 g/dL 7.0  7.4    Albumin 3.9 - 4.9 g/dL 4.4   4.1      AST 0 - 40 IU/L 27   29      ALT 0 - 32 IU/L 27   43      Alk Phosphatase  44 - 121 IU/L 62   62      Total Bilirubin 0.0 - 1.2 mg/dL 0.3        This result is from an external source.    Lab Results  Component Value Date/Time   TSH 0.876 06/06/2021 03:32 PM   TSH 1.020 10/06/2020 11:17 AM   FREET4 1.18 06/06/2021 03:32 PM   FREET4 1.12 10/06/2020 11:17 AM       Latest Ref Rng & Units 10/18/2021   12:00 AM 07/22/2021    9:10 AM 07/22/2021    9:06 AM  CBC  WBC  9.2        Hemoglobin 12.0 - 16.0 12.1     11.6  11.6    11.6   Hematocrit 36 - 46 37     34.0  34.0    34.0   Platelets 150 - 400 K/uL 299           This result is from an external source.   Multiple values from one day are sorted in reverse-chronological order    Lab Results  Component Value Date/Time   VITAMINB12 1,502 (H) 12/30/2020 02:51 PM    Clinical ASCVD:  No  The 10-year ASCVD risk score (Arnett DK, et al., 2019) is: 27.6%   Values used to calculate the score:     Age: 14 years     Sex: Female     Is Non-Hispanic African American: Yes     Diabetic: Yes     Tobacco smoker: No     Systolic Blood Pressure: 361 mmHg     Is BP treated: Yes     HDL Cholesterol: 81 mg/dL     Total Cholesterol: 201 mg/dL       03/31/2021    9:56 AM 03/24/2020   10:01 AM 07/10/2019   10:14 AM  Depression screen PHQ 2/9  Decreased Interest 0 0 0  Down, Depressed, Hopeless 0 0 0  PHQ - 2 Score 0 0 0  Altered sleeping   3  Tired, decreased energy   0  Change in appetite   0  Feeling bad or failure about yourself    0  Trouble concentrating   0  Moving slowly or fidgety/restless   0  Suicidal thoughts   0  PHQ-9 Score   3  Difficult doing work/chores   Not difficult at all     ***Other: (CHADS2VASc if Afib, MMRC or CAT for COPD, ACT, DEXA)  Social History   Tobacco Use  Smoking Status Former   Packs/day: 0.75   Years: 20.00   Total pack years: 15.00   Types: Cigarettes   Quit date: 2002   Years since quitting: 22.0   Passive exposure: Past  Smokeless Tobacco Never   BP Readings  from Last 3 Encounters:  01/19/22 126/68  01/17/22 116/68  01/04/22 120/60   Pulse Readings from Last 3 Encounters:  01/19/22 85  01/17/22 97  01/04/22 (!) 104   Wt Readings from Last 3 Encounters:  01/19/22 242 lb 8 oz (110 kg)  01/17/22 237 lb 3.2 oz (107.6 kg)  01/04/22 243 lb (110.2 kg)   BMI Readings from Last 3 Encounters:  01/19/22 42.96 kg/m  01/17/22 41.75 kg/m  01/04/22 42.77 kg/m    Allergies  Allergen Reactions   Statins     CK elevation   Sulfa Antibiotics Rash   Sulfamethoxazole-Trimethoprim Rash    Medications Reviewed Today     Reviewed by Lynne Logan, RN (Registered Nurse) on 02/14/22 at 1010  Med List Status: <None>   Medication Order Taking? Sig Documenting Provider Last Dose Status Informant  Accu-Chek Softclix Lancets lancets [id number] No Use as instructed to check blood sugars twice daily E11.69 Glendale Chard, MD Taking Active   amLODipine (NORVASC) 5 MG tablet [id number] No Take 1 tablet (5 mg total) by mouth daily. Skeet Latch, MD Taking Active Self  aspirin EC 81 MG tablet [id number] No Take 81 mg by mouth daily. Some times [provider] Taking Active Self  Blood Glucose Monitoring Suppl (ACCU-CHEK GUIDE) w/Device KIT [id number] No Use to check blood sugars twice daily E11.69 Glendale Chard, MD Taking Active   doxycycline (VIBRA-TABS) 100 MG tablet [id number] No Take 1 tablet (100 mg total) by mouth 2 (two) times daily. Glendale Chard, MD Taking Active   Evolocumab [facility] SURECLICK) 937 MG/ML Darden Palmer [id number] No Inject 140 mg into the skin every 14 (fourteen) days. Skeet Latch, MD Taking Active   fluticasone [facility]) 50 MCG/ACT nasal spray [id number] No Place 1 spray into both nostrils daily. [provider] Taking Active Self  folic acid (FOLVITE) 1 MG tablet [id number] No Take 1 mg by mouth daily.  [provider] Taking Active   glucose blood (ACCU-CHEK GUIDE) test strip [id number] No USE TO CHECK BLOOD  SUGAR TWICE DAILY AS DIRECTED Glendale Chard, MD Taking Active   hydrochlorothiazide (HYDRODIURIL) 25 MG tablet [id number] No TAKE 1 TABLET BY MOUTH  DAILY Glendale Chard, MD Taking Active   hydroxychloroquine (PLAQUENIL) 200 MG tablet [id number] No Take 200 mg by mouth daily. [provider] Taking Active Self  levothyroxine (SYNTHROID) 50 MCG tablet [id number] No Take 50 mcg by mouth daily.  [provider] Taking Active Self  methotrexate (RHEUMATREX) 2.5 MG tablet [id number] No Take 10 mg by mouth once a week. [provider] Taking Active   Multiple Vitamin (MULTIVITAMIN) tablet [id number] No Take 1 tablet by mouth daily. [provider] Taking Active Self  omeprazole (PRILOSEC) 40 MG capsule [id number] No TAKE 1 CAPSULE BY MOUTH  DAILY BEFORE A MEAL Glendale Chard, MD Taking Active Self  predniSONE (DELTASONE) 10 MG tablet [id number] No Take 10 mg by mouth daily with breakfast. Taper [provider] Taking Active Self  Semaglutide, 1 MG/DOSE, (OZEMPIC, 1 MG/DOSE,) 2 MG/1.5ML SOPN [id number] No Inject 1 mg into the skin once a week. Glendale Chard, MD Taking Active Self  sodium chloride flush (NS) 0.9 % injection 3 mL [id number]   Skeet Latch, MD  Active   telmisartan (MICARDIS) 80 MG tablet [id number] No TAKE 1 TABLET BY MOUTH  DAILY Glendale Chard, MD Taking Active   traZODone (DESYREL) 50 MG tablet [id number] No TAKE 1 TABLET BY MOUTH AT  BEDTIME AS NEEDED FOR SLEEP Dohmeier, Asencion Partridge, MD Taking Active             Patient Active Problem List   Diagnosis Date Noted   Palpitations 01/19/2022   Diabetes mellitus ([place]) 12/08/2021   Spinal stenosis of lumbar region 12/06/2021   Right flank pain 08/30/2021   Acute right-sided low back pain 08/30/2021   Normocytic anemia 08/30/2021   Mixed hyperlipidemia 07/17/2021   Thyroid nodule 07/17/2021   Pulmonary nodule 07/17/2021   Myositis 07/17/2021   Pericardial calcification 07/12/2021   Tachycardia  05/30/2021   Exertional dyspnea 05/30/2021   OSA on CPAP 05/26/2021   GERD with apnea 05/26/2021   Attention deficit hyperactivity disorder, predominantly inattentive type 12/06/2020   History of anemia 12/06/2020   Hyperglycemia due to type 2 diabetes mellitus ([place]) 12/06/2020   Hypothyroidism 12/06/2020   Menopausal symptom 12/06/2020   Non-toxic goiter 12/06/2020   Pure hypercholesterolemia 12/06/2020   Positive ANA (antinuclear antibody) 12/06/2020   Pain in left knee 12/06/2020   Chronic insomnia 04/26/2020   Snoring 04/26/2020   Class 3 severe obesity due to excess calories with serious comorbidity and body mass index (BMI) of 40.0 to 44.9 in adult ([place]) 03/18/2019   Essential hypertension, benign 11/13/2017   Gastroesophageal reflux disease without esophagitis 11/13/2017   Hypertension 09/04/2017   Type II diabetes mellitus, uncontrolled 09/04/2017   S/P abdominal hysterectomy and left salpingo-oophorectomy 08/18/1994    Immunization History  Administered Date(s) Administered   DTaP 02/28/2012   Fluad Quad(high Dose 65+) 11/07/2019, 11/02/2021   Influenza, High Dose Seasonal PF 11/14/2017, 10/30/2018   Influenza-Unspecified 11/14/2017, 10/30/2018, 11/10/2020   Moderna Covid-19 Vaccine Bivalent Booster 23yr & up 11/10/2020   Moderna Sars-Covid-2 Vaccination 03/28/2019, 04/25/2019, 02/03/2020, 07/04/2020, 11/08/2021   Pneumococcal Conjugate-13 04/12/2018   Pneumococcal Polysaccharide-23 12/30/2020   Pneumococcal-Unspecified 08/06/2013   Zoster Recombinat (Shingrix) 11/14/2018, 07/29/2019     Compliance/Adherence/Medication fill history: Care Gaps: ***  Star-Rating Drugs: ***  SDOH:  (Social Determinants of Health) assessments and interventions performed: {yes/no:20286} SDOH Interventions    Flowsheet Row Chronic Care Management from 08/18/2020 in [facility] Chronic Care Management from 08/06/2019 in [facility] Chronic  Care Management from 07/25/2019 in [facility] Chronic Care Management from 07/24/2019 in [facility] Clinical Support from 07/10/2019 in [facility]  SDOH Interventions       Food Insecurity Interventions -- -- -- Intervention Not Indicated --  Housing Interventions -- -- -- Intervention Not Indicated --  Transportation Interventions -- -- -- Intervention Not Indicated --  Depression Interventions/Treatment  -- -- -- -- PHQ2-9 Score <4 Follow-up Not Indicated  Financial Strain Interventions Other (Comment)  [assistance completed for Ozempic] Other (Comment)  [Assisted patient with completing patient portion of Ozempic PAP application] Other (Comment)  [Will help patient with patient assistance application for Ozempic] -- --      SDOH Screenings   Food Insecurity: No Food Insecurity (03/31/2021)  Housing: Low Risk  (07/24/2019)  Transportation Needs: No Transportation Needs (03/31/2021)  Depression (PHQ2-9): Low Risk  (03/31/2021)  Financial Resource Strain: High Risk (04/29/2021)  Physical Activity: Inactive (03/31/2021)  Stress: No Stress Concern Present (03/31/2021)  Tobacco Use: Medium Risk (01/19/2022)    Medication Assistance: {MEDASSISTANCEINFO:25044}  Medication Access: Within the past 30 days, how often has patient missed a dose of medication? *** Is a pillbox or other method used to improve adherence? {YES/NO:21197} Factors that may affect medication adherence? {CHL DESC; BARRIERS:21522} Are meds synced by current pharmacy? {YES/NO:21197} Are meds delivered by current pharmacy? {YES/NO:21197} Does patient experience delays in picking up medications due to transportation concerns? {YES/NO:21197}  [facility] Reviewed: Is patient disadvantaged to use [facility]?: {YES/NO:21197} Current Rx insurance plan: *** Name and location of Current pharmacy:  [facility] [address] [place] [address] Phone: [phone] Fax: [phone]  [place], [address] [place] [address] Phone: [phone] Fax: [phone]  [facility] services reviewed with patient today?: {YES/NO:21197} Patient requests to transfer care to [facility]?: {YES/NO:21197} Reason patient declined to change pharmacies: {US patient preference:27474}   Assessment/Plan   {CCM PHARMD DISEASE STATES:25130}  ***

## 2022-02-24 ENCOUNTER — Telehealth: Payer: Self-pay

## 2022-02-24 NOTE — Progress Notes (Cosign Needed)
Patient approved to receive Ozempic through [employer] patient assistance program. Enrollment ends 02/13/2023.   Pattricia Boss, [place] Pharmacist Assistant [phone]

## 2022-03-06 ENCOUNTER — Ambulatory Visit (HOSPITAL_BASED_OUTPATIENT_CLINIC_OR_DEPARTMENT_OTHER): Payer: Medicare Other | Admitting: Family

## 2022-03-06 ENCOUNTER — Encounter (HOSPITAL_BASED_OUTPATIENT_CLINIC_OR_DEPARTMENT_OTHER): Payer: Self-pay | Admitting: Family

## 2022-03-06 VITALS — BP 120/66 | HR 94 | Ht 63.0 in | Wt 244.0 lb

## 2022-03-06 DIAGNOSIS — R002 Palpitations: Secondary | ICD-10-CM

## 2022-03-06 DIAGNOSIS — I318 Other specified diseases of pericardium: Secondary | ICD-10-CM

## 2022-03-06 DIAGNOSIS — G4733 Obstructive sleep apnea (adult) (pediatric): Secondary | ICD-10-CM | POA: Diagnosis not present

## 2022-03-06 DIAGNOSIS — I10 Essential (primary) hypertension: Secondary | ICD-10-CM

## 2022-03-06 MED ORDER — METOPROLOL TARTRATE 25 MG PO TABS: 25.0000 mg | ORAL_TABLET | Freq: Two times a day (BID) | ORAL | 2 refills | Status: DC | PRN

## 2022-03-06 NOTE — Progress Notes (Signed)
Office Visit    Patient Name: Michelle Hicks Date of Encounter: 03/06/2022  PCP:  Glendale Chard, [facility]  Cardiologist:  Skeet Latch, MD  Advanced Practice Provider:  No care team member to display Electrophysiologist:  None      Chief Complaint    Michelle Hicks is a 72 y.o. female presents today for follow up after monitor   Past Medical History    Past Medical History:  Diagnosis Date   Depression    DM (diabetes mellitus) ([place])    Exertional dyspnea 05/30/2021   HTN (hypertension)    Palpitations 01/19/2022   Pericardial calcification 07/12/2021   Tachycardia 05/30/2021   Past Surgical History:  Procedure Laterality Date   ABDOMINAL HYSTERECTOMY     RIGHT/LEFT HEART CATH AND CORONARY ANGIOGRAPHY N/A 07/22/2021   Procedure: RIGHT/LEFT HEART CATH AND CORONARY ANGIOGRAPHY;  Surgeon: Sherren Mocha, MD;  Location: [place] [facility];  Service: Cardiovascular;  Laterality: N/A;   TONSILLECTOMY Bilateral 1967    Allergies  Allergies  Allergen Reactions   Statins     CK elevation   Sulfa Antibiotics Rash   Sulfamethoxazole-Trimethoprim Rash    History of Present Illness    Michelle Hicks is a 73 y.o. female with a hx of HTN, DM2, OSA, palpitations, depression last seen 01/19/22.  Ecoh 05/2021 LVEF 60-65%, mild LVH, moderate hypertrophy of basal septum, normal diastolic function. Coronary CTA 05/2021 normal coronary arteries but did not thickening and calcification of pericardium. [facility] 07/2021 normal coronaries and no evidence of constriction.   Saw Dr. Haynes Dage 01/19/22 noting palpitations with heart rate above 140 bpm at rest.  30-day monitor placed with preliminary report revealing predominantly normal sinus rhythm with average heart rate of 87 bpm with rare PVC/PAC <1% burden.  One 6 beat run of VT which she thinks might have been at the time she was taking off the monitor.  This was not a triggered episode.  She  presents today for follow up. Tells me sh has a back issue that is predicated on her weight loss. She is considering water aerobics for weight loss - encouraged to look into [facility]. She previously completed PREP. Blood pressure at home 110s/60s. Notes no lightheadedness nor dizziness. NOtes no new dyspnea. She is using her CPAP regularly. Notes she had no episodes of tachypalpitations while wearing her monitor.   EKGs/Labs/Other Studies Reviewed:   The following studies were reviewed today:  Left LE Venous Doppler 07/30/2020: Summary:   LEFT:  - There is no evidence of deep vein thrombosis in the lower extremity.  - There is no evidence of superficial venous thrombosis.     - No cystic structure found in the popliteal fossa.   Echo 06/09/21: IMPRESSIONS    1. Left ventricular ejection fraction, by estimation, is 60 to 65%. Left  ventricular ejection fraction by 3D volume is 62 %. The left ventricle has  normal function. The left ventricle has no regional wall motion  abnormalities. There is mild concentric  left ventricular hypertrophy and moderate basal septal hypertrophy.   2. Right ventricular systolic function is normal. The right ventricular  size is mildly enlarged. Tricuspid regurgitation signal is inadequate for  assessing PA pressure.   3. Right atrial size was mildly dilated.   4. The mitral valve is normal in structure. No evidence of mitral valve  regurgitation. No evidence of mitral stenosis.   5. The aortic valve is normal  in structure. There is moderate  calcification of the aortic valve. Aortic valve regurgitation is not  visualized. Aortic valve sclerosis/calcification is present, without any  evidence of aortic stenosis. Aortic valve area,  by VTI measures 2.20 cm. Aortic valve mean gradient measures 6.0 mmHg.  Aortic valve Vmax measures 1.58 m/s.   6. The inferior vena cava is normal in size with greater than 50%  respiratory variability,  suggesting right atrial pressure of 3 mmHg.    Coronary CTA  06/11/21: IMPRESSION: 1. Tortuous coronary arteries with no evidence of CAD, CADRADS = 0. Image quality degraded by reduced signal to noise ratio, no definite proximal or mid plaque or coronary calcifications seen.   2. Coronary calcium score of 0.   3. Normal coronary origins with left dominance.   4. Scattered pericardial calcifications seen circumferentially. For indication of dyspnea on exertion, consider evaluation for constrictive pericarditis.   5. Mild dilation of main pulmonary artery, 31 mm, may suggest increased pulmonary pressure.   LHC/RHC 07/2021: Angiographically normal coronary arteries (left dominant) Normal/low right and left heart pressures No ventricular interdependence with simultaneous LV/RV pressure recording   Normal study - no hemodynamic evidence for constrictive pericarditis    EKG:  EKG is not ordered today.    Recent Labs: 06/06/2021: TSH 0.876 10/18/2021: Hemoglobin 12.1; Platelets 299 01/03/2022: ALT 27; BUN 13; Creatinine, Ser 0.82; Potassium 4.1; Sodium 136  Recent Lipid Panel    Component Value Date/Time   CHOL 201 (H) 01/03/2022 1040   TRIG 67 01/03/2022 1040   HDL 81 01/03/2022 1040   CHOLHDL 2.5 01/03/2022 1040   LDLCALC 108 (H) 01/03/2022 1040    Home Medications   Current Meds  Medication Sig   Accu-Chek Softclix Lancets lancets Use as instructed to check blood sugars twice daily E11.69   amLODipine (NORVASC) 5 MG tablet Take 1 tablet (5 mg total) by mouth daily.   aspirin EC 81 MG tablet Take 81 mg by mouth daily. Some times   Blood Glucose Monitoring Suppl (ACCU-CHEK GUIDE) w/Device KIT Use to check blood sugars twice daily E11.69   Evolocumab (REPATHA SURECLICK) 322 MG/ML SOAJ Inject 140 mg into the skin every 14 (fourteen) days.   fluticasone (FLONASE) 50 MCG/ACT nasal spray Place 1 spray into both nostrils daily.   glucose blood (ACCU-CHEK GUIDE) test strip USE TO  CHECK BLOOD SUGAR TWICE DAILY AS DIRECTED   hydrochlorothiazide (HYDRODIURIL) 25 MG tablet TAKE 1 TABLET BY MOUTH  DAILY   hydroxychloroquine (PLAQUENIL) 200 MG tablet Take 200 mg by mouth daily.   levothyroxine (SYNTHROID) 50 MCG tablet Take 50 mcg by mouth daily.    metoprolol tartrate (LOPRESSOR) 25 MG tablet Take 1 tablet (25 mg total) by mouth 2 (two) times daily as needed (For resting heart rate more than 120 bpm with palpitations).   Multiple Vitamin (MULTIVITAMIN) tablet Take 1 tablet by mouth daily.   omeprazole (PRILOSEC) 40 MG capsule TAKE 1 CAPSULE BY MOUTH  DAILY BEFORE A MEAL   predniSONE (DELTASONE) 10 MG tablet Take 10 mg by mouth daily with breakfast. Taper   Semaglutide, 1 MG/DOSE, (OZEMPIC, 1 MG/DOSE,) 2 MG/1.5ML SOPN Inject 1 mg into the skin once a week.   telmisartan (MICARDIS) 80 MG tablet TAKE 1 TABLET BY MOUTH  DAILY   traZODone (DESYREL) 50 MG tablet TAKE 1 TABLET BY MOUTH AT  BEDTIME AS NEEDED FOR SLEEP   Current Facility-Administered Medications for the 03/06/22 encounter (Office Visit) with Loel Dubonnet, NP  Medication  sodium chloride flush (NS) 0.9 % injection 3 mL     Review of Systems      All other systems reviewed and are otherwise negative except as noted above.  Physical Exam    VS:  BP 120/66   Pulse 94   Ht '5\' 3"'$  (1.6 m)   Wt 244 lb (110.7 kg)   BMI 43.22 kg/m  , BMI Body mass index is 43.22 kg/m.  Wt Readings from Last 3 Encounters:  03/06/22 244 lb (110.7 kg)  01/19/22 242 lb 8 oz (110 kg)  01/17/22 237 lb 3.2 oz (107.6 kg)     GEN: Well nourished, overweight, well developed, in no acute distress. HEENT: normal. Neck: Supple, no JVD, carotid bruits, or masses. Cardiac: RRR, no murmurs, rubs, or gallops. No clubbing, cyanosis, edema.  Radials/PT 2+ and equal bilaterally.  Respiratory:  Respirations regular and unlabored, clear to auscultation bilaterally. GI: Soft, nontender, nondistended. MS: No deformity or atrophy. Skin: Warm  and dry, no rash. Neuro:  Strength and sensation are intact. Psych: Normal affect.  Assessment & Plan    HTN-BP well controlled. Continue current antihypertensive regimen.  She has some relatively hypotensive readings but denies symptoms such as lightheadedness or dizziness.  Pericardial calcification- No tamponade.  No heart failure symptoms today.  Continue to monitor.  Repeat echocardiogram upcoming 05/2022.  OSA on CPAP-endorses using CPAP regularly.  Palpitations - No tachypalpitations while wearing monitor. Monitor preliminary report predominantly NSR with average heart rate 87 bpm. One 6 beat run of VT during which time she notes she was either sleeping or thinks she might have been removing monitor. She is understandably concerned about palpitations returning - will provide Rx Metoprolol Tartrate '25mg'$  PRN for breakthrough palpitations with heart rate >120bpm at rest. Encouraged to continue to stay well hydrated, avoid caffeine, manage stress well to prevent palpitations.   HLD-she has been started on Repatha.  Plans to have repeat lipid panel next month.         Disposition: Follow up  06/2022  with Skeet Latch, MD or APP.  Signed, Loel Dubonnet, NP 03/06/2022, 9:20 AM [place]

## 2022-03-06 NOTE — Patient Instructions (Signed)
Medication Instructions:  Your physician has recommended you make the following change in your medication:   Start: Metoprolol Tartrate '25mg'$  twice daily as needed for resting heart rate more than 120 with palpitations  *If you need a refill on your cardiac medications before your next appointment, please call your pharmacy*  Follow-Up: At [facility], you and your health needs are our priority.  As part of our continuing mission to provide you with exceptional heart care, we have created designated Provider Care Teams.  These Care Teams include your primary Cardiologist (physician) and Advanced Practice Providers (APPs -  Physician Assistants and Nurse Practitioners) who all work together to provide you with the care you need, when you need it.  We recommend signing up for the patient portal called "MyChart".  Sign up information is provided on this After Visit Summary.  MyChart is used to connect with patients for Virtual Visits (Telemedicine).  Patients are able to view lab/test results, encounter notes, upcoming appointments, etc.  Non-urgent messages can be sent to your provider as well.   To learn more about what you can do with MyChart, go to [email or web address].    Your next appointment:   May 2024  Provider:   Skeet Latch, MD or Laurann Montana, NP    Other Instructions To prevent palpitations: Make sure you are adequately hydrated.  Avoid and/or limit caffeine containing beverages like soda or tea. Exercise regularly.  Manage stress well. Some over the counter medications can cause palpitations such as Benadryl, AdvilPM, TylenolPM. Regular Advil or Tylenol do not cause palpitations.   Heart Healthy Diet Recommendations: A low-salt diet is recommended. Meats should be grilled, baked, or boiled. Avoid fried foods. Focus on lean protein sources like fish or chicken with vegetables and fruits. The American Heart Association is a Microbiologist!  American Heart  Association Diet and Lifeystyle Recommendations   Exercise recommendations: The American Heart Association recommends 150 minutes of moderate intensity exercise weekly. Try 30 minutes of moderate intensity exercise 4-5 times per week. This could include walking, jogging, or swimming.

## 2022-03-07 ENCOUNTER — Ambulatory Visit
Admission: RE | Admit: 2022-03-07 | Discharge: 2022-03-07 | Disposition: A | Discharge status: Home / Self Care | Payer: Medicare Other | Source: Ambulatory Visit | Attending: Rheumatology | Admitting: Rheumatology

## 2022-03-07 ENCOUNTER — Other Ambulatory Visit (HOSPITAL_COMMUNITY)
Admission: RE | Admit: 2022-03-07 | Discharge: 2022-03-07 | Disposition: A | Discharge status: Home / Self Care | Payer: Medicare Other | Source: Ambulatory Visit | Attending: Physician Assistant | Admitting: Physician Assistant

## 2022-03-07 DIAGNOSIS — G4733 Obstructive sleep apnea (adult) (pediatric): Secondary | ICD-10-CM

## 2022-03-07 DIAGNOSIS — E049 Nontoxic goiter, unspecified: Secondary | ICD-10-CM | POA: Diagnosis not present

## 2022-03-07 DIAGNOSIS — E041 Nontoxic single thyroid nodule: Secondary | ICD-10-CM

## 2022-03-09 ENCOUNTER — Other Ambulatory Visit: Payer: Self-pay | Admitting: Internal Medicine

## 2022-03-09 LAB — CYTOLOGY - NON PAP

## 2022-03-13 ENCOUNTER — Telehealth: Payer: Self-pay

## 2022-03-13 NOTE — Progress Notes (Addendum)
As of 03/07/2022 patient picked up 4 boxes of Ozempic 1 mg- [employer] patient assistance.    Pattricia Boss, [place] Pharmacist Assistant [phone]

## 2022-03-14 DIAGNOSIS — E7800 Pure hypercholesterolemia, unspecified: Secondary | ICD-10-CM | POA: Diagnosis not present

## 2022-03-14 DIAGNOSIS — I10 Essential (primary) hypertension: Secondary | ICD-10-CM | POA: Diagnosis not present

## 2022-03-14 LAB — LIPID PANEL
Chol/HDL Ratio: 1.8 ratio (ref 0.0–4.4)
Cholesterol, Total: 151 mg/dL (ref 100–199)
HDL: 85 mg/dL (ref >39)
LDL Chol Calc (NIH): 51 mg/dL (ref 0–99)
Triglycerides: 77 mg/dL (ref 0–149)
VLDL Cholesterol Cal: 15 mg/dL (ref 5–40)

## 2022-04-13 ENCOUNTER — Ambulatory Visit (INDEPENDENT_AMBULATORY_CARE_PROVIDER_SITE_OTHER): Payer: Medicare Other | Admitting: Internal Medicine

## 2022-04-13 ENCOUNTER — Ambulatory Visit (INDEPENDENT_AMBULATORY_CARE_PROVIDER_SITE_OTHER): Payer: Medicare Other

## 2022-04-13 ENCOUNTER — Encounter: Payer: Self-pay | Admitting: Internal Medicine

## 2022-04-13 VITALS — BP 116/60 | HR 94 | Temp 98.2°F | Ht 64.0 in | Wt 240.0 lb

## 2022-04-13 VITALS — BP 116/60 | HR 94 | Temp 98.2°F | Ht 64.6 in | Wt 240.8 lb

## 2022-04-13 DIAGNOSIS — Z86010 Personal history of colonic polyps: Secondary | ICD-10-CM

## 2022-04-13 DIAGNOSIS — Z3183 Encounter for assisted reproductive fertility procedure cycle: Secondary | ICD-10-CM

## 2022-04-13 DIAGNOSIS — Z6841 Body Mass Index (BMI) 40.0 and over, adult: Secondary | ICD-10-CM

## 2022-04-13 DIAGNOSIS — Z0000 Encounter for general adult medical examination without abnormal findings: Secondary | ICD-10-CM | POA: Diagnosis not present

## 2022-04-13 DIAGNOSIS — E1169 Type 2 diabetes mellitus with other specified complication: Secondary | ICD-10-CM

## 2022-04-13 DIAGNOSIS — R911 Solitary pulmonary nodule: Secondary | ICD-10-CM | POA: Diagnosis not present

## 2022-04-13 DIAGNOSIS — E785 Hyperlipidemia, unspecified: Secondary | ICD-10-CM | POA: Diagnosis not present

## 2022-04-13 DIAGNOSIS — Z23 Encounter for immunization: Secondary | ICD-10-CM | POA: Diagnosis not present

## 2022-04-13 DIAGNOSIS — I10 Essential (primary) hypertension: Secondary | ICD-10-CM | POA: Diagnosis not present

## 2022-04-13 NOTE — Progress Notes (Addendum)
TDAP administered today. Was billed through [facility] with patient's consent.  Subjective:   Michelle Hicks is a 72 y.o. female who presents for Medicare Annual (Subsequent) preventive examination.  Review of Systems     Cardiac Risk Factors include: advanced age (>35mn, >>42women);diabetes mellitus;dyslipidemia;hypertension;obesity (BMI >30kg/m2)     Objective:    Today's Vitals   04/13/22 1021  BP: 116/60  Pulse: 94  Temp: 98.2 F (36.8 C)  TempSrc: Oral  SpO2: 98%  Weight: 240 lb 12.8 oz (109.2 kg)  Height: 5' 4.6" (1.641 m)   Body mass index is 40.57 kg/m.     04/13/2022   10:32 AM 12/13/2021   11:06 AM 07/22/2021    7:47 AM 03/31/2021    9:54 AM 03/24/2020   10:00 AM 07/24/2019    2:01 PM 07/10/2019   10:12 AM  Advanced Directives  Does Patient Have a Medical Advance Directive? No No No No No No No  Would patient like information on creating a medical advance directive? No - Patient declined No - Patient declined No - Patient declined Yes (MAU/Ambulatory/Procedural Areas - Information given) No - Patient declined  Yes (MAU/Ambulatory/Procedural Areas - Information given)    Current Medications (verified) Outpatient Encounter Medications as of 04/13/2022  Medication Sig   Accu-Chek Softclix Lancets lancets Use as instructed to check blood sugars twice daily E11.69   amLODipine (NORVASC) 5 MG tablet Take 1 tablet (5 mg total) by mouth daily.   Blood Glucose Monitoring Suppl (ACCU-CHEK GUIDE) w/Device KIT Use to check blood sugars twice daily E11.69   fluticasone (FLONASE) 50 MCG/ACT nasal spray Place 1 spray into both nostrils daily.   glucose blood (ACCU-CHEK GUIDE) test strip USE TO CHECK BLOOD SUGAR TWICE DAILY AS DIRECTED   hydrochlorothiazide (HYDRODIURIL) 25 MG tablet TAKE 1 TABLET BY MOUTH DAILY   hydroxychloroquine (PLAQUENIL) 200 MG tablet Take 200 mg by mouth daily.   levothyroxine (SYNTHROID) 50 MCG tablet Take 50 mcg by mouth daily.    Multiple Vitamin  (MULTIVITAMIN) tablet Take 1 tablet by mouth daily.   predniSONE (DELTASONE) 10 MG tablet Take 5 mg by mouth daily with breakfast. Taper   Semaglutide, 1 MG/DOSE, (OZEMPIC, 1 MG/DOSE,) 2 MG/1.5ML SOPN Inject 1 mg into the skin once a week.   telmisartan (MICARDIS) 80 MG tablet TAKE 1 TABLET BY MOUTH DAILY   traZODone (DESYREL) 50 MG tablet TAKE 1 TABLET BY MOUTH AT  BEDTIME AS NEEDED FOR SLEEP   Vitamin D, Cholecalciferol, 25 MCG (1000 UT) CAPS Take by mouth.   aspirin EC 81 MG tablet Take 81 mg by mouth daily. Some times (Patient not taking: Reported on 04/13/2022)   Evolocumab (REPATHA SURECLICK) [id number]/ML SOAJ Inject 140 mg into the skin every 14 (fourteen) days. (Patient not taking: Reported on [id number]   folic acid (FOLVITE) 1 MG tablet Take 1 mg by mouth daily. (Patient not taking: Reported on 03/06/2022)   methotrexate (RHEUMATREX) 2.5 MG tablet Take 10 mg by mouth once a week. (Patient not taking: Reported on 03/06/2022)   metoprolol tartrate (LOPRESSOR) 25 MG tablet Take 1 tablet (25 mg total) by mouth 2 (two) times daily as needed (For resting heart rate more than 120 bpm with palpitations). (Patient not taking: Reported on 04/13/2022)   omeprazole (PRILOSEC) 40 MG capsule TAKE 1 CAPSULE BY MOUTH  DAILY BEFORE A MEAL (Patient not taking: Reported on 04/13/2022)   Facility-Administered Encounter Medications as of 04/13/2022  Medication   sodium chloride flush (NS) 0.9 %  injection 3 mL    Allergies (verified) Statins, Sulfa antibiotics, and Sulfamethoxazole-trimethoprim   History: Past Medical History:  Diagnosis Date   Depression    DM (diabetes mellitus) ([place])    Exertional dyspnea 05/30/2021   HTN (hypertension)    Palpitations 01/19/2022   Pericardial calcification 07/12/2021   Tachycardia 05/30/2021   Past Surgical History:  Procedure Laterality Date   ABDOMINAL HYSTERECTOMY     RIGHT/LEFT HEART CATH AND CORONARY ANGIOGRAPHY N/A 07/22/2021   Procedure: RIGHT/LEFT HEART CATH  AND CORONARY ANGIOGRAPHY;  Surgeon: Sherren Mocha, MD;  Location: [facility];  Service: Cardiovascular;  Laterality: N/A;   TONSILLECTOMY Bilateral 1967   Family History  Problem Relation Age of Onset   Diabetes Mother    Pancreatic cancer Mother    Hypertension Father    Heart attack Father 23   Breast cancer Neg Hx    Social History   Socioeconomic History   Marital status: Married    Spouse name: Not on file   Number of children: Not on file   Years of education: Not on file   Highest education level: Not on file  Occupational History   Occupation: retired  Tobacco Use   Smoking status: Former    Packs/day: 0.75    Years: 20.00    Total pack years: 15.00    Types: Cigarettes    Quit date: 2002    Years since quitting: 22.1    Passive exposure: Past   Smokeless tobacco: Never  Vaping Use   Vaping Use: Never used  Substance and Sexual Activity   Alcohol use: Yes    Comment: socially    Drug use: No   Sexual activity: Not Currently    Partners: Male    Birth control/protection: Surgical  Other Topics Concern   Not on file  Social History Narrative   Not on file   Social Determinants of Health   Financial Resource Strain: Low Risk  (04/13/2022)   Overall Financial Resource Strain (CARDIA)    Difficulty of Paying Living Expenses: Not hard at all  Food Insecurity: No Food Insecurity (04/13/2022)   Hunger Vital Sign    Worried About Running Out of Food in the Last Year: Never true    [place] in the Last Year: Never true  Transportation Needs: No Transportation Needs (04/13/2022)   PRAPARE - Hydrologist (Medical): No    Lack of Transportation (Non-Medical): No  Physical Activity: Inactive (04/13/2022)   Exercise Vital Sign    Days of Exercise per Week: 0 days    Minutes of Exercise per Session: 0 min  Stress: No Stress Concern Present (04/13/2022)   [place]    Feeling of Stress : Only a little  Social Connections: Not on file    Tobacco Counseling Counseling given: Not Answered   Clinical Intake:  Pre-visit preparation completed: Yes  Pain : 0-10 Pain Type: Chronic pain Pain Location: Back Pain Orientation: Lower Pain Descriptors / Indicators: Aching Pain Onset: More than a month ago Pain Frequency: Intermittent Pain Relieving Factors: APAP helps some  Pain Relieving Factors: APAP helps some  Nutritional Status: BMI > 30  Obese Nutritional Risks: None Diabetes: Yes  How often do you need to have someone help you when you read instructions, pamphlets, or other written materials from your doctor or pharmacy?: 1 - Never  Diabetic? Yes Nutrition Risk Assessment:  Has the  patient had any N/V/D within the last 2 months?  No  Does the patient have any non-healing wounds?  No  Has the patient had any unintentional weight loss or weight gain?  No   Diabetes:  Is the patient diabetic?  Yes  If diabetic, was a CBG obtained today?  No  Did the patient bring in their glucometer from home?  No  How often do you monitor your CBG's? daily.   Financial Strains and Diabetes Management:  Are you having any financial strains with the device, your supplies or your medication? No .  Does the patient want to be seen by Chronic Care Management for management of their diabetes?  No  Would the patient like to be referred to a Nutritionist or for Diabetic Management?  No   Diabetic Exams:  Diabetic Eye Exam: Completed 12/2021 Diabetic Foot Exam: Completed 07/13/2021   Interpreter Needed?: No  Information entered by :: NAllen LPN   Activities of Daily Living    04/13/2022   10:34 AM  In your present state of health, do you have any difficulty performing the following activities:  Hearing? 0  Vision? 1  Comment eyes get watery  Difficulty concentrating or making decisions? 0  Walking or climbing stairs? 1  Dressing  or bathing? 0  Doing errands, shopping? 0  Preparing Food and eating ? N  Using the Toilet? N  In the past six months, have you accidently leaked urine? Y  Do you have problems with loss of bowel control? N  Managing your Medications? N  Managing your Finances? N  Housekeeping or managing your Housekeeping? N    Patient Care Team: Glendale Chard, MD as PCP - General (Internal Medicine) Skeet Latch, MD as PCP - Cardiology (Cardiology) Mayford Knife, [facility] (Pharmacist) Rex Kras Claudette Stapler, RN as [place] any recent Medical Services you may have received from other than [facility] providers in the past year (date may be approximate).     Assessment:   This is a routine wellness examination for [place].  Hearing/Vision screen Vision Screening - Comments:: Regular eye exams, [facility]  Dietary issues and exercise activities discussed: Current Exercise Habits: The patient does not participate in regular exercise at present   Goals Addressed             This Visit's Progress    Patient Stated       04/13/2022, wants to lose weight       Depression Screen    04/13/2022   10:34 AM 03/31/2021    9:56 AM 03/24/2020   10:01 AM 07/10/2019   10:14 AM 04/15/2019    2:41 PM 09/10/2018    9:02 AM 06/11/2018    8:51 AM  PHQ 2/9 Scores  PHQ - 2 Score 0 0 0 0 0 0 0  PHQ- 9 Score    3       Fall Risk    04/13/2022   10:33 AM 03/31/2021    9:55 AM 03/28/2021    8:16 PM 03/24/2020   10:01 AM 07/10/2019   10:14 AM  Fall Risk   Falls in the past year? 1 0 0 0 0  Comment not really sure what happened      Number falls in past yr: 0  0    Injury with Fall? 0  0    Risk for fall due to : Medication side effect;Impaired balance/gait Medication side effect  Medication side effect  Medication side effect  Follow up Falls prevention discussed;Education provided;Falls evaluation completed Falls evaluation completed;Education provided;Falls prevention  discussed  Falls evaluation completed;Education provided;Falls prevention discussed Falls evaluation completed;Education provided;Falls prevention discussed    FALL RISK PREVENTION PERTAINING TO THE HOME:  Any stairs in or around the home? Yes  If so, are there any without handrails? No  Home free of loose throw rugs in walkways, pet beds, electrical cords, etc? Yes  Adequate lighting in your home to reduce risk of falls? Yes   ASSISTIVE DEVICES UTILIZED TO PREVENT FALLS:  Life alert? No  Use of a cane, walker or w/c? No  Grab bars in the bathroom? Yes  Shower chair or bench in shower? No  Elevated toilet seat or a handicapped toilet? Yes   TIMED UP AND GO:  Was the test performed? Yes .  Length of time to ambulate 10 feet: 5 sec.   Gait steady and fast without use of assistive device  Cognitive Function:        04/13/2022   10:36 AM 03/31/2021    9:56 AM 03/24/2020   10:03 AM 07/10/2019   10:17 AM  6CIT Screen  What Year? 0 points 0 points 0 points 0 points  What month? 0 points 0 points 0 points 0 points  What time? 0 points 0 points 0 points 0 points  Count back from 20 0 points 0 points 0 points 0 points  Months in reverse 0 points 0 points 0 points 0 points  Repeat phrase 4 points 2 points 4 points 0 points  Total Score 4 points 2 points 4 points 0 points    Immunizations Immunization History  Administered Date(s) Administered   DTaP 02/28/2012   Fluad Quad(high Dose 65+) 11/07/2019, 11/02/2021   Influenza, High Dose Seasonal PF 11/14/2017, 10/30/2018   Influenza-Unspecified 11/14/2017, 10/30/2018, 11/10/2020   Moderna Covid-19 Vaccine Bivalent Booster 31yr & up 11/10/2020   Moderna Sars-Covid-2 Vaccination 03/28/2019, 04/25/2019, 02/03/2020, 07/04/2020, 11/08/2021   Pneumococcal Conjugate-13 04/12/2018   Pneumococcal Polysaccharide-23 12/30/2020   Pneumococcal-Unspecified 08/06/2013   Tdap 04/13/2022   Zoster Recombinat (Shingrix) 11/14/2018, 07/29/2019     TDAP status: Due, Education has been provided regarding the importance of this vaccine. Advised may receive this vaccine at local pharmacy or Health Dept. Aware to provide a copy of the vaccination record if obtained from local pharmacy or Health Dept. Verbalized acceptance and understanding.  Flu Vaccine status: Up to date  Pneumococcal vaccine status: Up to date  Covid-19 vaccine status: Completed vaccines  Qualifies for Shingles Vaccine? Yes   Zostavax completed Yes   Shingrix Completed?: Yes  Screening Tests Health Maintenance  Topic Date Due   COVID-19 Vaccine (7 - 2023-24 season) 01/03/2022   OPHTHALMOLOGY EXAM  01/11/2022   HEMOGLOBIN A1C  06/09/2022   Diabetic kidney evaluation - Urine ACR  07/14/2022   FOOT EXAM  07/14/2022   Diabetic kidney evaluation - eGFR measurement  01/04/2023   Medicare Annual Wellness (AWV)  04/13/2023   MAMMOGRAM  12/20/2023   COLONOSCOPY (Pts 45-423yrInsurance coverage will need to be confirmed)  12/24/2029   DTaP/Tdap/Td (3 - Td or Tdap) 04/12/2032   Pneumonia Vaccine 6574Years old  Completed   INFLUENZA VACCINE  Completed   DEXA SCAN  Completed   Hepatitis C Screening  Completed   Zoster Vaccines- Shingrix  Completed   HPV VACCINES  Aged Out    Health Maintenance  Health Maintenance Due  Topic Date Due   COVID-19 Vaccine (7 -  2023-24 season) 01/03/2022   OPHTHALMOLOGY EXAM  01/11/2022    Colorectal cancer screening: Type of screening: Colonoscopy. Completed 12/25/2019. Repeat every 10 years  Mammogram status: Completed 12/19/2021. Repeat every year  Bone Density status: Completed 08/01/2018.   Lung Cancer Screening: (Low Dose CT Chest recommended if Age 79-80 years, 30 pack-year currently smoking OR have quit w/in 15years.) does not qualify.   Lung Cancer Screening Referral: no  Additional Screening:  Hepatitis C Screening: does qualify; Completed 02/28/2012  Vision Screening: Recommended annual ophthalmology exams for  early detection of glaucoma and other disorders of the eye. Is the patient up to date with their annual eye exam?  Yes  Who is the provider or what is the name of the office in which the patient attends annual eye exams? [facility] If pt is not established with a provider, would they like to be referred to a provider to establish care? No .   Dental Screening: Recommended annual dental exams for proper oral hygiene  Community Resource Referral / Chronic Care Management: CRR required this visit?  No   CCM required this visit?  No      Plan:     I have personally reviewed and noted the following in the patient's chart:   Medical and social history Use of alcohol, tobacco or illicit drugs  Current medications and supplements including opioid prescriptions. Patient is not currently taking opioid prescriptions. Functional ability and status Nutritional status Physical activity Advanced directives List of other physicians Hospitalizations, surgeries, and ER visits in previous 12 months Vitals Screenings to include cognitive, depression, and falls Referrals and appointments  In addition, I have reviewed and discussed with patient certain preventive protocols, quality metrics, and best practice recommendations. A written personalized care plan for preventive services as well as general preventive health recommendations were provided to patient.     Kellie Simmering, LPN   [id number]   Nurse Notes: none

## 2022-04-13 NOTE — Patient Instructions (Signed)

## 2022-04-13 NOTE — Progress Notes (Signed)
I,Michelle Hicks,acting as a scribe for Michelle Greenland, MD.,have documented all relevant documentation on the behalf of Michelle Greenland, MD,as directed by  Michelle Greenland, MD while in the presence of Michelle Greenland, MD.    Subjective:     Patient ID: Michelle Hicks , female    DOB: 11/20/1950 , 72 y.o.   MRN: [id number]   Chief Complaint  Patient presents with   Diabetes   Hypertension   Hyperlipidemia    HPI  The patient is here today for a follow-up on her diabetes and blood pressure.  She reports compliance with meds.  She denies headaches, chest pain and shortness of breath.    AWV completed with [facility] Advisor, [place] today.     Diabetes She presents for her follow-up diabetic visit. She has type 2 diabetes mellitus. Her disease course has been stable. There are no hypoglycemic associated symptoms. There are no diabetic associated symptoms. Pertinent negatives for diabetes include no blurred vision, no polydipsia, no polyphagia and no polyuria. There are no hypoglycemic complications. Symptoms are stable. Risk factors for coronary artery disease include diabetes mellitus, obesity, sedentary lifestyle and post-menopausal. She is compliant with treatment most of the time. She is following a generally healthy diet. She participates in exercise intermittently. An ACE inhibitor/angiotensin II receptor blocker is being taken. Eye exam is current.  Hypertension This is a chronic problem. The current episode started more than 1 year ago. The problem has been gradually improving since onset. The problem is controlled. Pertinent negatives include no blurred vision. The current treatment provides moderate improvement.  Hyperlipidemia     Past Medical History:  Diagnosis Date   Depression    DM (diabetes mellitus) ([place])    Exertional dyspnea 05/30/2021   HTN (hypertension)    Palpitations 01/19/2022   Pericardial calcification 07/12/2021   Tachycardia 05/30/2021     Family  History  Problem Relation Age of Onset   Diabetes Mother    Pancreatic cancer Mother    Hypertension Father    Heart attack Father 47   Breast cancer Neg Hx      Current Outpatient Medications:    Accu-Chek Softclix Lancets lancets, Use as instructed to check blood sugars twice daily E11.69, Disp: 180 each, Rfl: 5   amLODipine (NORVASC) 5 MG tablet, Take 1 tablet (5 mg total) by mouth daily., Disp: 90 tablet, Rfl: 3   aspirin EC 81 MG tablet, Take 81 mg by mouth daily. Some times (Patient not taking: Reported on 04/13/2022), Disp: , Rfl:    Blood Glucose Monitoring Suppl (ACCU-CHEK GUIDE) w/Device KIT, Use to check blood sugars twice daily E11.69, Disp: 1 kit, Rfl: 1   Evolocumab (REPATHA SURECLICK) [id number] MG/ML SOAJ, Inject 140 mg into the skin every 14 (fourteen) days. (Patient not taking: Reported on 04/13/2022), Disp: 6 mL, Rfl: 3   fluticasone (FLONASE) 50 MCG/ACT nasal spray, Place 1 spray into both nostrils daily., Disp: , Rfl:    folic acid (FOLVITE) 1 MG tablet, Take 1 mg by mouth daily. (Patient not taking: Reported on 03/06/2022), Disp: , Rfl:    glucose blood (ACCU-CHEK GUIDE) test strip, USE TO CHECK BLOOD SUGAR TWICE DAILY AS DIRECTED, Disp: 100 strip, Rfl: 3   hydrochlorothiazide (HYDRODIURIL) 25 MG tablet, TAKE 1 TABLET BY MOUTH DAILY, Disp: 100 tablet, Rfl: 2   hydroxychloroquine (PLAQUENIL) 200 MG tablet, Take 200 mg by mouth daily., Disp: , Rfl:    levothyroxine (SYNTHROID) 50 MCG tablet, Take 50 mcg  by mouth daily. , Disp: , Rfl:    metoprolol tartrate (LOPRESSOR) 25 MG tablet, Take 1 tablet (25 mg total) by mouth 2 (two) times daily as needed (For resting heart rate more than 120 bpm with palpitations). (Patient not taking: Reported on 04/13/2022), Disp: 30 tablet, Rfl: 2   Multiple Vitamin (MULTIVITAMIN) tablet, Take 1 tablet by mouth daily., Disp: , Rfl:    omeprazole (PRILOSEC) 40 MG capsule, TAKE 1 CAPSULE BY MOUTH  DAILY BEFORE A MEAL (Patient not taking: Reported on  04/13/2022), Disp: 90 capsule, Rfl: 2   predniSONE (DELTASONE) 10 MG tablet, Take 5 mg by mouth daily with breakfast. Taper, Disp: , Rfl:    Semaglutide, 1 MG/DOSE, (OZEMPIC, 1 MG/DOSE,) 2 MG/1.5ML SOPN, Inject 1 mg into the skin once a week., Disp: 4.5 mL, Rfl: 3   telmisartan (MICARDIS) 80 MG tablet, TAKE 1 TABLET BY MOUTH DAILY, Disp: 100 tablet, Rfl: 2   traZODone (DESYREL) 50 MG tablet, TAKE 1 TABLET BY MOUTH AT  BEDTIME AS NEEDED FOR SLEEP, Disp: 90 tablet, Rfl: 1   Vitamin D, Cholecalciferol, 25 MCG (1000 UT) CAPS, Take by mouth., Disp: , Rfl:   Current Facility-Administered Medications:    sodium chloride flush (NS) 0.9 % injection 3 mL, 3 mL, Intravenous, Q12H, Skeet Latch, MD   Allergies  Allergen Reactions   Statins     CK elevation   Sulfa Antibiotics Rash   Sulfamethoxazole-Trimethoprim Rash     Review of Systems  Constitutional: Negative.   Eyes:  Negative for blurred vision.  Respiratory: Negative.    Cardiovascular: Negative.   Gastrointestinal: Negative.   Endocrine: Negative for polydipsia, polyphagia and polyuria.  Skin: Negative.   Neurological: Negative.   Psychiatric/Behavioral: Negative.       Today's Vitals   04/13/22 1033  BP: 116/60  Pulse: 94  Temp: 98.2 F (36.8 C)  SpO2: 98%  Weight: 240 lb (108.9 kg)  Height: '5\' 4"'$  (1.626 m)   Body mass index is 41.2 kg/m.  Wt Readings from Last 3 Encounters:  04/13/22 240 lb (108.9 kg)  04/13/22 240 lb 12.8 oz (109.2 kg)  03/06/22 244 lb (110.7 kg)    Objective:  Physical Exam Vitals and nursing note reviewed.  Constitutional:      Appearance: Normal appearance.  HENT:     Head: Normocephalic and atraumatic.     Nose:     Comments: Masked     Mouth/Throat:     Comments: Masked  Eyes:     Extraocular Movements: Extraocular movements intact.  Cardiovascular:     Rate and Rhythm: Normal rate and regular rhythm.     Heart sounds: Normal heart sounds.  Pulmonary:     Effort: Pulmonary  effort is normal.     Breath sounds: Normal breath sounds.  Musculoskeletal:     Cervical back: Normal range of motion.  Skin:    General: Skin is warm.  Neurological:     General: No focal deficit present.     Mental Status: She is alert.  Psychiatric:        Mood and Affect: Mood normal.        Behavior: Behavior normal.         Assessment And Plan:     1. Dyslipidemia associated with type 2 diabetes mellitus ([place]) Comments: Chronic, she will c/w Ozempic '1mg'$  weekly, encouraged to decrease intake of packaged foods. She will f/u in 3-4 months for re-evaluation. - Hemoglobin A1c - AMB Referral to [employer]  Care Coordinaton ([facility] Patients)  2. Essential hypertension, benign Comments: Chronic, well controlled. She will c/w telmisartan '80mg'$  hctz 12.'5mg'$  and metoprolol '25mg'$  twice daily. Encouraged to follow low sodium diet. - Hemoglobin A1c - AMB Referral to [place] ([facility] Patients)  3. Pulmonary nodule Comments: Non-contrast CT needed May 2024 for f/u. She has h/o tobacco use.  4. Class 3 severe obesity due to excess calories with serious comorbidity and body mass index (BMI) of 40.0 to 44.9 in adult [facility]) Comments: She is encouraged to strive for BMI less than 30 to decrease cardiac risk. Advised to aim for at least 150 minutes of exercise per week  5. History of colonic polyps  Patient was given opportunity to ask questions. Patient verbalized understanding of the plan and was able to repeat key elements of the plan. All questions were answered to their satisfaction.   I, Michelle Greenland, MD, have reviewed all documentation for this visit. The documentation on 04/13/22 for the exam, diagnosis, procedures, and orders are all accurate and complete.   IF YOU HAVE BEEN REFERRED TO A SPECIALIST, IT MAY TAKE 1-2 WEEKS TO SCHEDULE/PROCESS THE REFERRAL. IF YOU HAVE NOT HEARD FROM US/SPECIALIST IN TWO WEEKS, PLEASE GIVE [place] A CALL AT [phone].   THE PATIENT IS  ENCOURAGED TO PRACTICE SOCIAL DISTANCING DUE TO THE COVID-19 PANDEMIC.

## 2022-04-13 NOTE — Patient Instructions (Signed)
Ms. Michelle Hicks , Thank you for taking time to come for your Medicare Wellness Visit. I appreciate your ongoing commitment to your health goals. Please review the following plan we discussed and let me know if I can assist you in the future.   These are the goals we discussed:  Goals       I would like to have my abnormal labs evaluated (pt-stated)      Care Coordination Interventions: Evaluation of current treatment plan related to Myositis and patient's adherence to plan as established by provider Determined patient is seeing a new Rheumatologist for second opinion of Polymyositis treatment recommendations Reviewed medications with patient and discussed importance of infection control to help avoid illness due to effects of immunosuppression  Reviewed and discussed next scheduled visit with Rheumatologist scheduled for 04/28/22 Instructed patient to report symptoms of illness promptly to PCP for evaluation and treatment       Manage My Medicine      Timeframe:  Long-Range Goal Priority:  High Start Date:                             Expected End Date:                       Follow Up Date: 01/27/2022    In Progress:   - call for medicine refill 2 or 3 days before it runs out - call if I am sick and can't take my medicine - keep a list of all the medicines I take; vitamins and herbals too - use an alarm clock or phone to remind me to take my medicine    Why is this important?   These steps will help you keep on track with your medicines.   Notes:  Please call if you have any questions       Patient Stated      07/10/2019, wants to get to 235 pounds      Patient Stated      03/24/2020, wants to get under 200 pounds      Patient Stated      03/31/2021, get healthier and stay mobile      Patient Stated      04/13/2022, wants to lose weight      to improve my back pain      Care Coordination Interventions: Reviewed provider established plan for pain management Counseled on the  importance of reporting any/all new or changed pain symptoms or management strategies to pain management provider Advised patient to report to care team affect of pain on daily activities Reviewed with patient benefits of stretching and maintaining daily exercise regimen to help manage pain and improve balance/ROM Determined patient experienced a fall outside of her home with minor knee scraping, she is unsure of the cause of the fall but reports wearing new glasses when the fall occurred Educated patient regarding fall prevention and instructed patient to notify PCP of any/all falls            This is a list of the screening recommended for you and due dates:  Health Maintenance  Topic Date Due   COVID-19 Vaccine (7 - 2023-24 season) 01/03/2022   Eye exam for diabetics  01/11/2022   Hemoglobin A1C  06/09/2022   Yearly kidney health urinalysis for diabetes  07/14/2022   Complete foot exam   07/14/2022   Yearly kidney function blood test for diabetes  01/04/2023   Medicare Annual Wellness Visit  04/13/2023   Mammogram  12/20/2023   Colon Cancer Screening  12/24/2029   DTaP/Tdap/Td vaccine (3 - Td or Tdap) 04/12/2032   Pneumonia Vaccine  Completed   Flu Shot  Completed   DEXA scan (bone density measurement)  Completed   Hepatitis C Screening: USPSTF Recommendation to screen - Ages 58-79 yo.  Completed   Zoster (Shingles) Vaccine  Completed   HPV Vaccine  Aged Out    Advanced directives: Advance directive discussed with you today. Even though you declined this today please call our office should you change your mind and we can give you the proper paperwork for you to fill out.  Conditions/risks identified: none  Next appointment: Follow up in one year for your annual wellness visit    Preventive Care 65 Years and Older, Female Preventive care refers to lifestyle choices and visits with your health care provider that can promote health and wellness. What does preventive care  include? A yearly physical exam. This is also called an annual well check. Dental exams once or twice a year. Routine eye exams. Ask your health care provider how often you should have your eyes checked. Personal lifestyle choices, including: Daily care of your teeth and gums. Regular physical activity. Eating a healthy diet. Avoiding tobacco and drug use. Limiting alcohol use. Practicing safe sex. Taking low-dose aspirin every day. Taking vitamin and mineral supplements as recommended by your health care provider. What happens during an annual well check? The services and screenings done by your health care provider during your annual well check will depend on your age, overall health, lifestyle risk factors, and family history of disease. Counseling  Your health care provider may ask you questions about your: Alcohol use. Tobacco use. Drug use. Emotional well-being. Home and relationship well-being. Sexual activity. Eating habits. History of falls. Memory and ability to understand (cognition). Work and work Statistician. Reproductive health. Screening  You may have the following tests or measurements: Height, weight, and BMI. Blood pressure. Lipid and cholesterol levels. These may be checked every 5 years, or more frequently if you are over 70 years old. Skin check. Lung cancer screening. You may have this screening every year starting at age 70 if you have a 30-pack-year history of smoking and currently smoke or have quit within the past 15 years. Fecal occult blood test (FOBT) of the stool. You may have this test every year starting at age 61. Flexible sigmoidoscopy or colonoscopy. You may have a sigmoidoscopy every 5 years or a colonoscopy every 10 years starting at age 49. Hepatitis C blood test. Hepatitis B blood test. Sexually transmitted disease (STD) testing. Diabetes screening. This is done by checking your blood sugar (glucose) after you have not eaten for a while  (fasting). You may have this done every 1-3 years. Bone density scan. This is done to screen for osteoporosis. You may have this done starting at age 66. Mammogram. This may be done every 1-2 years. Talk to your health care provider about how often you should have regular mammograms. Talk with your health care provider about your test results, treatment options, and if necessary, the need for more tests. Vaccines  Your health care provider may recommend certain vaccines, such as: Influenza vaccine. This is recommended every year. Tetanus, diphtheria, and acellular pertussis (Tdap, Td) vaccine. You may need a Td booster every 10 years. Zoster vaccine. You may need this after age 52. Pneumococcal 13-valent conjugate (PCV13) vaccine. One  dose is recommended after age 73. Pneumococcal polysaccharide (PPSV23) vaccine. One dose is recommended after age 71. Talk to your health care provider about which screenings and vaccines you need and how often you need them. This information is not intended to replace advice given to you by your health care provider. Make sure you discuss any questions you have with your health care provider. Document Released: 02/26/2015 Document Revised: 10/20/2015 Document Reviewed: 12/01/2014 Elsevier Interactive Patient Education  2017 [place] Prevention in the Home Falls can cause injuries. They can happen to people of all ages. There are many things you can do to make your home safe and to help prevent falls. What can I do on the outside of my home? Regularly fix the edges of walkways and driveways and fix any cracks. Remove anything that might make you trip as you walk through a door, such as a raised step or threshold. Trim any bushes or trees on the path to your home. Use bright outdoor lighting. Clear any walking paths of anything that might make someone trip, such as rocks or tools. Regularly check to see if handrails are loose or broken. Make sure that  both sides of any steps have handrails. Any raised decks and porches should have guardrails on the edges. Have any leaves, snow, or ice cleared regularly. Use sand or salt on walking paths during winter. Clean up any spills in your garage right away. This includes oil or grease spills. What can I do in the bathroom? Use night lights. Install grab bars by the toilet and in the tub and shower. Do not use towel bars as grab bars. Use non-skid mats or decals in the tub or shower. If you need to sit down in the shower, use a plastic, non-slip stool. Keep the floor dry. Clean up any water that spills on the floor as soon as it happens. Remove soap buildup in the tub or shower regularly. Attach bath mats securely with double-sided non-slip rug tape. Do not have throw rugs and other things on the floor that can make you trip. What can I do in the bedroom? Use night lights. Make sure that you have a light by your bed that is easy to reach. Do not use any sheets or blankets that are too big for your bed. They should not hang down onto the floor. Have a firm chair that has side arms. You can use this for support while you get dressed. Do not have throw rugs and other things on the floor that can make you trip. What can I do in the kitchen? Clean up any spills right away. Avoid walking on wet floors. Keep items that you use a lot in easy-to-reach places. If you need to reach something above you, use a strong step stool that has a grab bar. Keep electrical cords out of the way. Do not use floor polish or wax that makes floors slippery. If you must use wax, use non-skid floor wax. Do not have throw rugs and other things on the floor that can make you trip. What can I do with my stairs? Do not leave any items on the stairs. Make sure that there are handrails on both sides of the stairs and use them. Fix handrails that are broken or loose. Make sure that handrails are as long as the stairways. Check  any carpeting to make sure that it is firmly attached to the stairs. Fix any carpet that is loose or worn.  Avoid having throw rugs at the top or bottom of the stairs. If you do have throw rugs, attach them to the floor with carpet tape. Make sure that you have a light switch at the top of the stairs and the bottom of the stairs. If you do not have them, ask someone to add them for you. What else can I do to help prevent falls? Wear shoes that: Do not have high heels. Have rubber bottoms. Are comfortable and fit you well. Are closed at the toe. Do not wear sandals. If you use a stepladder: Make sure that it is fully opened. Do not climb a closed stepladder. Make sure that both sides of the stepladder are locked into place. Ask someone to hold it for you, if possible. Clearly mark and make sure that you can see: Any grab bars or handrails. First and last steps. Where the edge of each step is. Use tools that help you move around (mobility aids) if they are needed. These include: Canes. Walkers. Scooters. Crutches. Turn on the lights when you go into a dark area. Replace any light bulbs as soon as they burn out. Set up your furniture so you have a clear path. Avoid moving your furniture around. If any of your floors are uneven, fix them. If there are any pets around you, be aware of where they are. Review your medicines with your doctor. Some medicines can make you feel dizzy. This can increase your chance of falling. Ask your doctor what other things that you can do to help prevent falls. This information is not intended to replace advice given to you by your health care provider. Make sure you discuss any questions you have with your health care provider. Document Released: 11/26/2008 Document Revised: 07/08/2015 Document Reviewed: 03/06/2014 Elsevier Interactive Patient Education  2017 [employer].

## 2022-04-14 ENCOUNTER — Telehealth: Payer: Self-pay

## 2022-04-14 DIAGNOSIS — E1169 Type 2 diabetes mellitus with other specified complication: Secondary | ICD-10-CM | POA: Diagnosis not present

## 2022-04-14 LAB — HEMOGLOBIN A1C
Est. average glucose Bld gHb Est-mCnc: 148 mg/dL
Hgb A1c MFr Bld: 6.8 % — ABNORMAL HIGH (ref 4.8–5.6)

## 2022-04-14 NOTE — Progress Notes (Addendum)
04-14-2022: 1st attempt to reschedule missed appointment with Orlando Penner. 03-04-2024Orlando Penner scheduled appointment. Care Management & Coordination Services Pharmacy Team  Reason for Encounter: Appointment Reminder  Contacted patient to confirm telephone appointment with Orlando Penner, PharmD on 04-19-2022  at 12:00. Spoke with patient on 04/17/2022   Do you have any problems getting your medications? No  What is your top health concern you would like to discuss at your upcoming visit? Patient stated none at the moment   Have you seen any other providers since your last visit with PCP? No   Chart review: Recent office visits:  04-13-2022 Glendale Chard, MD. A1C= 6.8. Patient reported not taking methotrexate.  04-13-2022 Kellie Simmering, LPN. Medicare wellness visit. Tdap given  Recent consult visits:  03-06-2022 Loel Dubonnet, NP (Cardiology). START metoprolol 25 mg twice daily PRN. Patient reported not taking doxycycline  Hospital visits:  None in previous 6 months   Star Rating Drugs:  Ozempic 1 mg- PAP Telmisartan 80 mg- Last filled 03-14-2022 100 DS [facility]. Previous 01-03-2022 100 DS.  Care Gaps: Annual wellness visit in last year? Yes  If Diabetic: Last eye exam / retinopathy screening: Patient stated 12-2021 Last diabetic foot exam: None  Jeannette How [facility] Clinical Pharmacist Assistant [phone]

## 2022-04-18 DIAGNOSIS — G4733 Obstructive sleep apnea (adult) (pediatric): Secondary | ICD-10-CM | POA: Diagnosis not present

## 2022-04-19 ENCOUNTER — Ambulatory Visit: Payer: Medicare Other

## 2022-04-19 NOTE — Progress Notes (Addendum)
Care Management & Coordination Services Pharmacy Note  04/19/2022 Name:  Michelle Hicks MRN:  [id number] DOB:  1950-10-19  Summary: Patient reports that the Ozempic is not working to curb her appetite.   Recommendations/Changes made from today's visit: Recommend increasing patients Ozempic to 2 mg once per week.    Follow up plan: She would like to lose 6 pounds in the next three weeks with healthy eating habits.  She is going to keep a food log and continue to check her BS    Subjective: Michelle Hicks is an 72 y.o. year old female who is a primary patient of Dorothyann Peng, MD.  The care coordination team was consulted for assistance with disease management and care coordination needs.    Engaged with patient by telephone for follow up visit.  Recent office visits:  04-13-2022 Dorothyann Peng, MD. A1C= 6.8. Patient reported not taking methotrexate.   04-13-2022 Barb Merino, LPN. Medicare wellness visit. Tdap given   Recent consult visits:  03-06-2022 Alver Sorrow, NP (Cardiology). START metoprolol 25 mg twice daily PRN. Patient reported not taking doxycycline   Hospital visits:  None in previous 6 months   Objective:  Lab Results  Component Value Date   CREATININE 0.82 01/03/2022   BUN 13 01/03/2022   EGFR 77 01/03/2022   GFRNONAA 73 03/24/2020   GFRAA 84 03/24/2020   NA 136 01/03/2022   K 4.1 01/03/2022   CALCIUM 9.8 01/03/2022   CO2 20 01/03/2022   GLUCOSE 93 01/03/2022    Lab Results  Component Value Date/Time   HGBA1C 6.8 (H) 04/14/2022 09:19 AM   HGBA1C 7.2 (H) 12/08/2021 10:33 AM   HGBA1C 7.3 09/04/2017 12:00 AM   MICROALBUR 10 07/06/2020 12:47 PM   MICROALBUR 30 03/24/2020 10:27 AM    Last diabetic Eye exam:  Lab Results  Component Value Date/Time   HMDIABEYEEXA No Retinopathy 01/11/2021 12:00 AM    Last diabetic Foot exam: No results found for: "HMDIABFOOTEX"   Lab Results  Component Value Date   CHOL 151 03/14/2022   HDL 85  03/14/2022   LDLCALC 51 03/14/2022   TRIG 77 03/14/2022   CHOLHDL 1.8 03/14/2022       Latest Ref Rng & Units 01/03/2022   10:42 AM 11/21/2021    9:05 AM 10/18/2021   12:00 AM  Hepatic Function  Total Protein 6.0 - 8.5 g/dL 7.0  7.4    Albumin 3.9 - 4.9 g/dL 4.4   4.1      AST 0 - 40 IU/L 27   29      ALT 0 - 32 IU/L 27   43      Alk Phosphatase 44 - 121 IU/L 62   62      Total Bilirubin 0.0 - 1.2 mg/dL 0.3        This result is from an external source.    Lab Results  Component Value Date/Time   TSH 0.876 06/06/2021 03:32 PM   TSH 1.020 10/06/2020 11:17 AM   FREET4 1.18 06/06/2021 03:32 PM   FREET4 1.12 10/06/2020 11:17 AM       Latest Ref Rng & Units 10/18/2021   12:00 AM 07/22/2021    9:10 AM 07/22/2021    9:06 AM  CBC  WBC  9.2        Hemoglobin 12.0 - 16.0 12.1     11.6  11.6    11.6   Hematocrit 36 - 46 37  34.0  34.0    34.0   Platelets 150 - 400 K/uL 299           This result is from an external source.   Multiple values from one day are sorted in reverse-chronological order    Lab Results  Component Value Date/Time   HERDEYCX44 1,502 (H) 12/30/2020 02:51 PM    Clinical ASCVD: No  The 10-year ASCVD risk score (Arnett DK, et al., 2019) is: 30.7%   Values used to calculate the score:     Age: 72 years     Sex: Female     Is Non-Hispanic African American: Yes     Diabetic: Yes     Tobacco smoker: Yes     Systolic Blood Pressure: 116 mmHg     Is BP treated: Yes     HDL Cholesterol: 85 mg/dL     Total Cholesterol: 151 mg/dL        09/30/5629   49:70 AM 03/31/2021    9:56 AM 03/24/2020   10:01 AM  Depression screen PHQ 2/9  Decreased Interest 0 0 0  Down, Depressed, Hopeless 0 0 0  PHQ - 2 Score 0 0 0     Social History   Tobacco Use  Smoking Status Former   Packs/day: 0.75   Years: 20.00   Total pack years: 15.00   Types: Cigarettes   Quit date: 2002   Years since quitting: 22.1   Passive exposure: Past  Smokeless Tobacco Never   BP  Readings from Last 3 Encounters:  04/13/22 116/60  04/13/22 116/60  03/06/22 120/66   Pulse Readings from Last 3 Encounters:  04/13/22 94  04/13/22 94  03/06/22 94   Wt Readings from Last 3 Encounters:  04/13/22 240 lb (108.9 kg)  04/13/22 240 lb 12.8 oz (109.2 kg)  03/06/22 244 lb (110.7 kg)   BMI Readings from Last 3 Encounters:  04/13/22 41.20 kg/m  04/13/22 40.57 kg/m  03/06/22 43.22 kg/m    Allergies  Allergen Reactions   Statins     CK elevation   Sulfa Antibiotics Rash   Sulfamethoxazole-Trimethoprim Rash    Medications Reviewed Today     Reviewed by Dorothyann Peng, MD (Physician) on 04/13/22 at 1133  Med List Status: <None>   Medication Order Taking? Sig Documenting Provider Last Dose Status Informant  Accu-Chek Softclix Lancets lancets [id number] No Use as instructed to check blood sugars twice daily E11.69 Dorothyann Peng, MD Taking Active   amLODipine (NORVASC) 5 MG tablet [id number] No Take 1 tablet (5 mg total) by mouth daily. Chilton Si, MD Taking Active Self  aspirin EC 81 MG tablet [id number] No Take 81 mg by mouth daily. Some times  Patient not taking: Reported on 04/13/2022   [provider] Not Taking Active Self  Blood Glucose Monitoring Suppl (ACCU-CHEK GUIDE) w/Device KIT [id number] No Use to check blood sugars twice daily E11.69 Dorothyann Peng, MD Taking Active   Evolocumab [facility] SURECLICK) 140 MG/ML Ivory Broad [id number] No Inject 140 mg into the skin every 14 (fourteen) days.  Patient not taking: Reported on 04/13/2022   Chilton Si, MD Not Taking Active   fluticasone [facility]) 50 MCG/ACT nasal spray [id number] No Place 1 spray into both nostrils daily. [provider] Taking Active Self  folic acid (FOLVITE) 1 MG tablet [id number] No Take 1 mg by mouth daily.  Patient not taking: Reported on 03/06/2022   [provider] Not Taking Active   glucose blood (ACCU-CHEK GUIDE) test strip  [id number] No USE TO CHECK BLOOD  SUGAR TWICE DAILY AS DIRECTED Dorothyann Peng, MD Taking Active   hydrochlorothiazide (HYDRODIURIL) 25 MG tablet [id number] No TAKE 1 TABLET BY MOUTH DAILY Dorothyann Peng, MD Taking Active   hydroxychloroquine (PLAQUENIL) 200 MG tablet [id number] No Take 200 mg by mouth daily. [provider] Taking Active Self  levothyroxine (SYNTHROID) 50 MCG tablet [id number] No Take 50 mcg by mouth daily.  [provider] Taking Active Self  metoprolol tartrate (LOPRESSOR) 25 MG tablet [id number] No Take 1 tablet (25 mg total) by mouth 2 (two) times daily as needed (For resting heart rate more than 120 bpm with palpitations).  Patient not taking: Reported on 04/13/2022   Alver Sorrow, NP Not Taking Active   Multiple Vitamin (MULTIVITAMIN) tablet [id number] No Take 1 tablet by mouth daily. [provider] Taking Active Self  omeprazole (PRILOSEC) 40 MG capsule [id number] No TAKE 1 CAPSULE BY MOUTH  DAILY BEFORE A MEAL  Patient not taking: Reported on 04/13/2022   Dorothyann Peng, MD Not Taking Active Self  predniSONE (DELTASONE) 10 MG tablet [id number] No Take 5 mg by mouth daily with breakfast. Taper [provider] Taking Active Self  Semaglutide, 1 MG/DOSE, (OZEMPIC, 1 MG/DOSE,) 2 MG/1.5ML SOPN [id number] No Inject 1 mg into the skin once a week. Dorothyann Peng, MD Taking Active Self  sodium chloride flush (NS) 0.9 % injection 3 mL [id number]   Chilton Si, MD  Active   telmisartan (MICARDIS) 80 MG tablet [id number] No TAKE 1 TABLET BY MOUTH DAILY Dorothyann Peng, MD Taking Active   traZODone (DESYREL) 50 MG tablet [id number] No TAKE 1 TABLET BY MOUTH AT  BEDTIME AS NEEDED FOR SLEEP Dohmeier, Porfirio Mylar, MD Taking Active   Vitamin D, Cholecalciferol, 25 MCG (1000 UT) CAPS [id number] No Take by mouth. [provider] Taking Active Self            SDOH:  (Social Determinants of Health) assessments and interventions performed: No SDOH Interventions    Flowsheet Row  Clinical Support from 04/13/2022 in [facility] Chronic [facility] Management from 08/18/2020 in [facility] Chronic Care Management from 08/06/2019 in [facility] Chronic Care Management from 07/25/2019 in [facility] Chronic Care Management from 07/24/2019 in [facility] Clinical Support from 07/10/2019 in [facility]  SDOH Interventions        Food Insecurity Interventions Intervention Not Indicated -- -- -- Intervention Not Indicated --  Housing Interventions -- -- -- -- Intervention Not Indicated --  Transportation Interventions Intervention Not Indicated -- -- -- Intervention Not Indicated --  Depression Interventions/Treatment  -- -- -- -- -- PHQ2-9 Score <4 Follow-up Not Indicated  Financial Strain Interventions Intervention Not Indicated Other (Comment)  [assistance completed for Ozempic] Other (Comment)  [Assisted patient with completing patient portion of Ozempic PAP application] Other (Comment)  [Will help patient with patient assistance application for Ozempic] -- --  Physical Activity Interventions Patient Refused, Other (Comments) -- -- -- -- --  Stress Interventions Intervention Not Indicated -- -- -- -- --       Medication Assistance:  Ozempic obtained through [employer]  medication assistance program.  Enrollment ends 01/2023   Repatha patient assistance application in progress, patient to complete signature parts of the application.   Medication Access: Within the past 30 days, how often has patient missed a dose of medication?  none Is a pillbox or other method used to improve adherence? No  Factors that may affect medication adherence? financial need and lack of understanding of disease management Are meds synced by current pharmacy? No  Are meds delivered by current pharmacy? No   Does patient experience delays in picking up medications due to transportation concerns? No   Upstream Services Reviewed: Is patient disadvantaged to use [facility] Pharm[facility]?: Yes  Current Rx insurance plan: [facility] Medicare  Name and location of Current pharmacy:  [facility] - [place], [place] - [address] [place] [address] Phone: [phone] Fax: [phone]  [id number] [place], [place] - [address] AT [facility] [address] [facility] [address] [place] [address] Phone: [phone] Fax: [phone]  [facility] Pharmacy services reviewed with patient today?: No  Patient requests to transfer care to [facility] Pharmacy?: No  Reason patient declined to change pharmacies: Not mentioned at this visit  Compliance/Adherence/Medication fill history: Care Gaps: COVID-19 Booster Ophthalmology Exam   Star-Rating Drugs: Ozempic 1 mg- PAP Telmisartan 80 mg- Last filled 03-14-2022 100 DS [facility]. Previous 01-03-2022 100 DS.   Assessment/Plan   Diabetes (A1c goal <7%) -Controlled -Current medications: Ozempic 1 mg inject once per week Appropriate, Query effective  -Denies hypoglycemic/hyperglycemic symptoms -Current meal patterns:  breakfast: Peanut butter on toast with coffee  lunch: Tuna fish - homemade, sometimes chicken salad homemade, using 2 slices of honey wheat bread, a lot of celery and onion, a little bit of mayonnaise   dinner: stir fried shrimp, with greens pepper, red pepper and onions. A cup of brown rice  She used buillon and water with broth  snacks: 15 lays chips, banana, handful of walnuts, prior to bed she had a glucerna chocolate drinks: water only,  -Current exercise: none due to back pain -Educated on A1c and blood sugar goals; Prevention and management of hypoglycemic episodes; -she is being very cautious of what she is eating, and would like to lose enough weight to have back  surgery -We discussed the use of the plate method and writing a food diary for the next three weeks.   -Counseled to check feet daily and get yearly eye exams -Recommended to continue current medication   Hyperlipidemia: (LDL goal < 70 ) -Not ideally controlled -Current treatment: Repatha Sureclick 140 mg/ml - inject one shot Appropriate, Effective, Safe, Query accessible -Current dietary patterns: she has increased the amount of fruit she is eating including blueberries and raspberries.   -Current exercise habits: she is not exercising as often because of her  -Educated on Cholesterol goals;  Benefits of statin for ASCVD risk reduction; -She reports that she did  -Recommended to continue current medication Patient to have   Cherylin Mylar, CPP, PharmD Clinical Pharmacist Practitioner [facility] [phone]

## 2022-04-24 ENCOUNTER — Encounter: Payer: Self-pay | Admitting: *Deleted

## 2022-04-24 ENCOUNTER — Telehealth: Payer: Self-pay

## 2022-04-24 NOTE — Progress Notes (Signed)
PATIENT: Michelle Hicks DOB: 07/04/1950  REASON FOR VISIT: follow up HISTORY FROM: patient PRIMARY NEUROLOGIST:   Virtual Visit via Video Note  I connected with Michelle Hicks on 04/24/22 at  1:15 PM EDT by a video enabled telemedicine application located remotely at [facility] and verified that I am speaking with the correct person using two identifiers who was located at their own home.   I discussed the limitations of evaluation and management by telemedicine and the availability of in person appointments. The patient expressed understanding and agreed to proceed.   PATIENT: Michelle Hicks DOB: March 02, 1950  REASON FOR VISIT: follow up HISTORY FROM: patient  HISTORY OF PRESENT ILLNESS: Today 04/24/22:  Michelle Hicks is a 72 y.o. female with a history of OSA on CPAP. Returns today for follow-up.      04/12/21: Michelle Hicks is a 72 year old female with a history of obstructive sleep apnea on CPAP.  She returns today for virtual visit.  Her download is below.  However the patient states that she has used it more than what the download is reflecting.  She states that she looks at her app daily.    REVIEW OF SYSTEMS: Out of a complete 14 system review of symptoms, the patient complains only of the following symptoms, and all other reviewed systems are negative.  ALLERGIES: Allergies  Allergen Reactions   Statins     CK elevation   Sulfa Antibiotics Rash   Sulfamethoxazole-Trimethoprim Rash    HOME MEDICATIONS: Outpatient Medications Prior to Visit  Medication Sig Dispense Refill   Accu-Chek Softclix Lancets lancets Use as instructed to check blood sugars twice daily E11.69 180 each 5   amLODipine (NORVASC) 5 MG tablet Take 1 tablet (5 mg total) by mouth daily. 90 tablet 3   aspirin EC 81 MG tablet Take 81 mg by mouth daily. Some times (Patient not taking: Reported on 04/13/2022)     Blood Glucose Monitoring Suppl (ACCU-CHEK GUIDE) w/Device  KIT Use to check blood sugars twice daily E11.69 1 kit 1   Evolocumab (REPATHA SURECLICK) [id number] MG/ML SOAJ Inject 140 mg into the skin every 14 (fourteen) days. (Patient not taking: Reported on 04/13/2022) 6 mL 3   fluticasone (FLONASE) 50 MCG/ACT nasal spray Place 1 spray into both nostrils daily.     folic acid (FOLVITE) 1 MG tablet Take 1 mg by mouth daily. (Patient not taking: Reported on 03/06/2022)     glucose blood (ACCU-CHEK GUIDE) test strip USE TO CHECK BLOOD SUGAR TWICE DAILY AS DIRECTED 100 strip 3   hydrochlorothiazide (HYDRODIURIL) 25 MG tablet TAKE 1 TABLET BY MOUTH DAILY 100 tablet 2   hydroxychloroquine (PLAQUENIL) 200 MG tablet Take 200 mg by mouth daily.     levothyroxine (SYNTHROID) 50 MCG tablet Take 50 mcg by mouth daily.      metoprolol tartrate (LOPRESSOR) 25 MG tablet Take 1 tablet (25 mg total) by mouth 2 (two) times daily as needed (For resting heart rate more than 120 bpm with palpitations). (Patient not taking: Reported on 04/13/2022) 30 tablet 2   Multiple Vitamin (MULTIVITAMIN) tablet Take 1 tablet by mouth daily.     omeprazole (PRILOSEC) 40 MG capsule TAKE 1 CAPSULE BY MOUTH  DAILY BEFORE A MEAL (Patient not taking: Reported on 04/13/2022) 90 capsule 2   predniSONE (DELTASONE) 10 MG tablet Take 5 mg by mouth daily with breakfast. Taper     Semaglutide, 1 MG/DOSE, (OZEMPIC, 1 MG/DOSE,) 2 MG/1.5ML SOPN Inject  1 mg into the skin once a week. 4.5 mL 3   telmisartan (MICARDIS) 80 MG tablet TAKE 1 TABLET BY MOUTH DAILY 100 tablet 2   traZODone (DESYREL) 50 MG tablet TAKE 1 TABLET BY MOUTH AT  BEDTIME AS NEEDED FOR SLEEP 90 tablet 1   Vitamin D, Cholecalciferol, 25 MCG (1000 UT) CAPS Take by mouth.     Facility-Administered Medications Prior to Visit  Medication Dose Route Frequency Provider Last Rate Last Admin   sodium chloride flush (NS) 0.9 % injection 3 mL  3 mL Intravenous Q12H Skeet Latch, MD        PAST MEDICAL HISTORY: Past Medical History:  Diagnosis Date    Depression    DM (diabetes mellitus) ([place])    Exertional dyspnea 05/30/2021   HTN (hypertension)    Palpitations 01/19/2022   Pericardial calcification 07/12/2021   Tachycardia 05/30/2021    PAST SURGICAL HISTORY: Past Surgical History:  Procedure Laterality Date   ABDOMINAL HYSTERECTOMY     RIGHT/LEFT HEART CATH AND CORONARY ANGIOGRAPHY N/A 07/22/2021   Procedure: RIGHT/LEFT HEART CATH AND CORONARY ANGIOGRAPHY;  Surgeon: Sherren Mocha, MD;  Location: [facility];  Service: Cardiovascular;  Laterality: N/A;   TONSILLECTOMY Bilateral 1967    FAMILY HISTORY: Family History  Problem Relation Age of Onset   Diabetes Mother    Pancreatic cancer Mother    Hypertension Father    Heart attack Father 24   Breast cancer Neg Hx     SOCIAL HISTORY: Social History   Socioeconomic History   Marital status: Married    Spouse name: Not on file   Number of children: Not on file   Years of education: Not on file   Highest education level: Not on file  Occupational History   Occupation: retired  Tobacco Use   Smoking status: Former    Packs/day: 0.75    Years: 20.00    Total pack years: 15.00    Types: Cigarettes    Quit date: 2002    Years since quitting: 22.2    Passive exposure: Past   Smokeless tobacco: Never  Vaping Use   Vaping Use: Never used  Substance and Sexual Activity   Alcohol use: Yes    Comment: socially    Drug use: No   Sexual activity: Not Currently    Partners: Male    Birth control/protection: Surgical  Other Topics Concern   Not on file  Social History Narrative   Not on file   Social Determinants of Health   Financial Resource Strain: Low Risk  (04/13/2022)   Overall Financial Resource Strain (CARDIA)    Difficulty of Paying Living Expenses: Not hard at all  Food Insecurity: No Food Insecurity (04/13/2022)   Hunger Vital Sign    Worried About Running Out of Food in the Last Year: Never true    [place] in the Last Year: Never  true  Transportation Needs: No Transportation Needs (04/13/2022)   PRAPARE - Hydrologist (Medical): No    Lack of Transportation (Non-Medical): No  Physical Activity: Inactive (04/13/2022)   Exercise Vital Sign    Days of Exercise per Week: 0 days    Minutes of Exercise per Session: 0 min  Stress: No Stress Concern Present (04/13/2022)   [place]    Feeling of Stress : Only a little  Social Connections: Not on file  Intimate Partner Violence: Not on  file      PHYSICAL EXAM Generalized: Well developed, in no acute distress   Neurological examination  Mentation: Alert oriented to time, place, history taking. Follows all commands speech and language fluent Cranial nerve II-XII:Extraocular movements were full. Facial symmetry noted. uvula  Head turning and shoulder shrug  were normal and symmetric.  DIAGNOSTIC DATA (LABS, IMAGING, TESTING) - I reviewed patient records, labs, notes, testing and imaging myself where available.  Lab Results  Component Value Date   WBC 9.2 10/18/2021   HGB 12.1 10/18/2021   HCT 37 10/18/2021   MCV 83 07/13/2021   PLT 299 10/18/2021      Component Value Date/Time   NA 136 01/03/2022 1042   K 4.1 01/03/2022 1042   CL 100 01/03/2022 1042   CO2 20 01/03/2022 1042   GLUCOSE 93 01/03/2022 1042   GLUCOSE 98 10/13/2009 1956   BUN 13 01/03/2022 1042   CREATININE 0.82 01/03/2022 1042   CALCIUM 9.8 01/03/2022 1042   PROT 7.0 01/03/2022 1042   ALBUMIN 4.4 01/03/2022 1042   AST 27 01/03/2022 1042   ALT 27 01/03/2022 1042   ALKPHOS 62 01/03/2022 1042   BILITOT 0.3 01/03/2022 1042   GFRNONAA 73 03/24/2020 1034   GFRAA 84 03/24/2020 1034   Lab Results  Component Value Date   CHOL 151 03/14/2022   HDL 85 03/14/2022   LDLCALC 51 03/14/2022   TRIG 77 03/14/2022   CHOLHDL 1.8 03/14/2022   Lab Results  Component Value Date   HGBA1C 6.8 (H) 04/14/2022    Lab Results  Component Value Date   VITAMINB12 1,502 (H) 12/30/2020   Lab Results  Component Value Date   TSH 0.876 06/06/2021      ASSESSMENT AND PLAN 72 y.o. year old female  has a past medical history of Depression, DM (diabetes mellitus) ([place]), Exertional dyspnea (05/30/2021), HTN (hypertension), Palpitations (01/19/2022), Pericardial calcification (07/12/2021), and Tachycardia (05/30/2021). here with:  OSA on CPAP  CPAP compliance suboptimal, patient states that she is using it more than the download is reflecting.  Advised that she should contact her DME company to make sure information is being recorded correctly Residual AHI is good Encouraged patient to continue using CPAP nightly and > 4 hours each night F/U in 1 year or sooner if needed   Ward Givens, MSN, NP-C 04/24/2022, 4:14 PM [facility] [address] [place], [place] [address] [phone]

## 2022-04-24 NOTE — Progress Notes (Addendum)
Faxed completed application to [employer] for [place] assistance.  Pattricia Boss, [place] Pharmacist Assistant [phone]

## 2022-04-25 ENCOUNTER — Telehealth (INDEPENDENT_AMBULATORY_CARE_PROVIDER_SITE_OTHER): Payer: Medicare Other | Admitting: Adult Health

## 2022-04-25 ENCOUNTER — Telehealth: Payer: Medicare Other | Admitting: Adult Health

## 2022-04-25 DIAGNOSIS — G4733 Obstructive sleep apnea (adult) (pediatric): Secondary | ICD-10-CM | POA: Diagnosis not present

## 2022-04-28 DIAGNOSIS — R748 Abnormal levels of other serum enzymes: Secondary | ICD-10-CM | POA: Diagnosis not present

## 2022-04-28 DIAGNOSIS — M609 Myositis, unspecified: Secondary | ICD-10-CM | POA: Diagnosis not present

## 2022-04-28 DIAGNOSIS — Z79899 Other long term (current) drug therapy: Secondary | ICD-10-CM | POA: Diagnosis not present

## 2022-04-28 DIAGNOSIS — B370 Candidal stomatitis: Secondary | ICD-10-CM | POA: Diagnosis not present

## 2022-05-02 ENCOUNTER — Ambulatory Visit: Payer: Self-pay

## 2022-05-02 NOTE — Patient Instructions (Signed)
Visit Information  Thank you for taking time to visit with me today. Please don't hesitate to contact me if I can be of assistance to you.   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 She has chronic elevation of CK in 400 range which is probably normal for her size and her race. I plan to continue weaning her off immunotherapy and follow up CK levels and her symptoms. Orders Pt will try to wean off prednisone in the next couple of months Continue plaquenil 200 mg a day and prednisone  Return in about 6 months (around 10/29/2022) Plan to assess for patient understanding of prescribed txt plan and medication adherence with next RN CC follow up call       Other     To continue to work on lowering A1c        Care Coordination Interventions: Review of patient status, including review of consultants reports, relevant laboratory and other test results, and medications completed Counseled on Diabetic diet, my plate method, [id number] minutes of moderate intensity exercise/week Determined patient was unable to finish the PREP program due to increased pain secondary to Myositis Mailed printed educational materials related to How to Perform Chair Exercises Positive reinforcement provided to patient for making efforts to improve her diabetes and overall health           Our next appointment is by telephone on 07/20/22 at 12:30 PM  Please call the care guide team at [phone] if you need to cancel or reschedule your appointment.   If you are experiencing a Mental Health or  [place] or need someone to talk to, please call 1-800-273-TALK (toll free, 24 hour hotline) go to [facility] [address], [place] [phone])  Patient verbalizes understanding of instructions and care plan provided today and agrees to view in [place]. Active MyChart status and patient understanding of how to access instructions and care plan via MyChart confirmed with patient.     Barb Merino, RN, BSN, CCM Care Management Coordinator [facility] Care Management Direct Phone: [phone]

## 2022-05-02 NOTE — Patient Outreach (Signed)
  Care Coordination   Follow Up Visit Note   05/02/2022 Name: Michelle Hicks MRN: [id number] DOB: 1951/02/09  Michelle Hicks is a 72 y.o. year old female who sees Glendale Chard, MD for primary care. I spoke with  Michelle Hicks by phone today.  What matters to the patients health and wellness today?  Patient will continue to work on lowering her A1c.     Goals Addressed               This Visit's Progress     Patient Stated     I would like to have my abnormal labs evaluated (pt-stated)        Care Coordination Interventions: Evaluation of current treatment plan related to Myositis and patient's adherence to plan as established by provider Reviewed MD follow up completed on 04/28/22 for Myositis;  Pt with peripheral neuropathy from diabetes and spinal stenosis. She has chronic elevation of CK in 400 range which is probably normal for her size and her race. I plan to continue weaning her off immunotherapy and follow up CK levels and her symptoms. Orders Pt will try to wean off prednisone in the next couple of months Continue plaquenil 200 mg a day and prednisone  Return in about 6 months (around 10/29/2022) Plan to assess for patient understanding of prescribed txt plan and medication adherence with next RN CC follow up call       Other     To continue to work on lowering A1c        Care Coordination Interventions: Review of patient status, including review of consultants reports, relevant laboratory and other test results, and medications completed Counseled on Diabetic diet, my plate method, [id number] minutes of moderate intensity exercise/week Determined patient was unable to finish the PREP program due to increased pain secondary to Myositis Mailed printed educational materials related to How to Perform Chair Exercises Positive reinforcement provided to patient for making efforts to improve her diabetes and overall health    Interventions Today    Flowsheet Row Most  Recent Value  Chronic Disease   Chronic disease during today's visit Diabetes, Other  [OSA w/CPAP]  General Interventions   General Interventions Discussed/Reviewed General Interventions Discussed, General Interventions Reviewed, Labs  Labs Hgb A1c every 3 months  Exercise Interventions   Exercise Discussed/Reviewed Exercise Reviewed, Exercise Discussed  Education Interventions   Education Provided Provided Education, Provided Printed Education  Provided Verbal Education On Nutrition, Exercise  Nutrition Interventions   Nutrition Discussed/Reviewed Nutrition Discussed, Nutrition Reviewed, Carbohydrate meal planning, Portion sizes          SDOH assessments and interventions completed:  No     Care Coordination Interventions:  Yes, provided   Follow up plan: Follow up call scheduled for 07/20/22 @12 :30 PM    Encounter Outcome:  Pt. Visit Completed

## 2022-05-03 ENCOUNTER — Telehealth: Payer: Self-pay

## 2022-05-03 NOTE — Progress Notes (Cosign Needed)
05-03-2022: Initiated a PA for repatha. PA didn't go through and instructed me to contact [facility].  Was informed that repatha PA was approved on 12-27-2021-06-27-2022 with key: [id number].  [place] Pharmacist Assistant [phone]

## 2022-05-08 DIAGNOSIS — G4733 Obstructive sleep apnea (adult) (pediatric): Secondary | ICD-10-CM | POA: Diagnosis not present

## 2022-05-09 ENCOUNTER — Other Ambulatory Visit (INDEPENDENT_AMBULATORY_CARE_PROVIDER_SITE_OTHER): Payer: Medicare Other

## 2022-05-09 ENCOUNTER — Ambulatory Visit: Payer: Medicare Other | Admitting: Orthopaedic Surgery

## 2022-05-09 ENCOUNTER — Encounter: Payer: Self-pay | Admitting: Orthopaedic Surgery

## 2022-05-09 VITALS — BP 119/73 | Ht 64.0 in | Wt 240.0 lb

## 2022-05-09 DIAGNOSIS — M48062 Spinal stenosis, lumbar region with neurogenic claudication: Secondary | ICD-10-CM

## 2022-05-09 NOTE — Progress Notes (Signed)
Office Visit Note   Patient: Michelle Hicks           Date of Birth: May 24, 1950           MRN: [id number] Visit Date: 05/09/2022              Requested by: Glendale Chard, [address] [place],   [address] PCP: Glendale Chard, MD   Assessment & Plan: Visit Diagnoses:  1. Spinal stenosis of lumbar region with neurogenic claudication     Plan: Patient wants to wait and try next 3 months continue to get her weight down.  We discussed MRI imaging and options on decompression versus decompression and fusion depending on foraminal stenosis.  She only shows 1 mm with flexion extension use.  We can weigh patient on return.  Follow-Up Instructions: Return in about 3 months (around 08/09/2022).   Orders:  Orders Placed This Encounter  Procedures   XR Lumbar Spine 2-3 Views   No orders of the defined types were placed in this encounter.     Procedures: No procedures performed   Clinical Data: No additional findings.   Subjective: Chief Complaint  Patient presents with   Lower Back - Pain, Follow-up    HPI 72 year old female returns ongoing problems with back pain and pain radiating down her left leg primarily.  Cannot sleep on her left side she can sleep on her right her back.  She uses 1 Tylenol occasionally.  She leans on the grocery cart and has problems with prolonged standing more than 5 to 10 minutes.  Gets relief with sitting.  Past history of disc rupture when she was younger treated conservatively.  She is try to work on weight loss to get her BMI below 40 and did lose some weight and then unfortunately gained some weight back and now BMI is 41.2.  No associated bowel or bladder symptoms.  Review of Systems updated unchanged   Objective: Vital Signs: BP 119/73   Ht 5\' 4"  (1.626 m)   Wt 240 lb (108.9 kg)   BMI 41.20 kg/m   Physical Exam Constitutional:      Appearance: She is well-developed.  HENT:     Head: Normocephalic.     Right  Ear: External ear normal.     Left Ear: External ear normal. There is no impacted cerumen.  Eyes:     Pupils: Pupils are equal, round, and reactive to light.  Neck:     Thyroid: No thyromegaly.     Trachea: No tracheal deviation.  Cardiovascular:     Rate and Rhythm: Normal rate.  Pulmonary:     Effort: Pulmonary effort is normal.  Abdominal:     Palpations: Abdomen is soft.  Musculoskeletal:     Cervical back: No rigidity.  Skin:    General: Skin is warm and dry.  Neurological:     Mental Status: She is alert and oriented to person, place, and time.  Psychiatric:        Behavior: Behavior normal.     Ortho Exam Negative straight leg raising 90 degrees anterior tib EHL is strong and symmetrical.  Patient ambulates with mild hip extension bilaterally gait.  Negative logroll hips.  Specialty Comments:  No specialty comments available.  Imaging: XR Lumbar Spine 2-3 Views  Result Date: 05/09/2022 AP and lateral flexion-extension lumbar spine images are obtained this shows grade 1 anterolisthesis at L4-5 with facet arthropathy that shifts only 1 mm with flexion extension views.  Normal hip and pelvis. Impression: Anterolisthesis L4-5 with minimal change on flexion extension.    PMFS History: Patient Active Problem List   Diagnosis Date Noted   Palpitations 01/19/2022   Diabetes mellitus ([place]) 12/08/2021   Spinal stenosis of lumbar region 12/06/2021   Right flank pain 08/30/2021   Acute right-sided low back pain 08/30/2021   Normocytic anemia 08/30/2021   Mixed hyperlipidemia 07/17/2021   Thyroid nodule 07/17/2021   Pulmonary nodule 07/17/2021   Myositis 07/17/2021   Pericardial calcification 07/12/2021   Tachycardia 05/30/2021   Exertional dyspnea 05/30/2021   OSA on CPAP 05/26/2021   GERD with apnea 05/26/2021   Attention deficit hyperactivity disorder, predominantly inattentive type 12/06/2020   History of anemia 12/06/2020   Hyperglycemia due to type 2 diabetes  mellitus ([place]) 12/06/2020   Hypothyroidism 12/06/2020   Menopausal symptom 12/06/2020   Non-toxic goiter 12/06/2020   Pure hypercholesterolemia 12/06/2020   Positive ANA (antinuclear antibody) 12/06/2020   Pain in left knee 12/06/2020   Chronic insomnia 04/26/2020   Snoring 04/26/2020   Class 3 severe obesity due to excess calories with serious comorbidity and body mass index (BMI) of 40.0 to 44.9 in adult ([place]) 03/18/2019   Essential hypertension, benign 11/13/2017   Gastroesophageal reflux disease without esophagitis 11/13/2017   Hypertension 09/04/2017   Type II diabetes mellitus, uncontrolled 09/04/2017   S/P abdominal hysterectomy and left salpingo-oophorectomy 08/18/1994   Past Medical History:  Diagnosis Date   Depression    DM (diabetes mellitus) ([place])    Exertional dyspnea 05/30/2021   HTN (hypertension)    Palpitations 01/19/2022   Pericardial calcification 07/12/2021   Tachycardia 05/30/2021    Family History  Problem Relation Age of Onset   Diabetes Mother    Pancreatic cancer Mother    Hypertension Father    Heart attack Father 58   Breast cancer Neg Hx     Past Surgical History:  Procedure Laterality Date   ABDOMINAL HYSTERECTOMY     RIGHT/LEFT HEART CATH AND CORONARY ANGIOGRAPHY N/A 07/22/2021   Procedure: RIGHT/LEFT HEART CATH AND CORONARY ANGIOGRAPHY;  Surgeon: Sherren Mocha, MD;  Location: [facility];  Service: Cardiovascular;  Laterality: N/A;   TONSILLECTOMY Bilateral 1967   Social History   Occupational History   Occupation: retired  Tobacco Use   Smoking status: Former    Packs/day: 0.75    Years: 20.00    Additional pack years: 0.00    Total pack years: 15.00    Types: Cigarettes    Quit date: 2002    Years since quitting: 22.2    Passive exposure: Past   Smokeless tobacco: Never  Vaping Use   Vaping Use: Never used  Substance and Sexual Activity   Alcohol use: Yes    Comment: socially    Drug use: No   Sexual activity: Not  Currently    Partners: Male    Birth control/protection: Surgical

## 2022-05-12 ENCOUNTER — Other Ambulatory Visit (HOSPITAL_BASED_OUTPATIENT_CLINIC_OR_DEPARTMENT_OTHER): Payer: Self-pay | Admitting: Cardiovascular Disease

## 2022-05-22 ENCOUNTER — Ambulatory Visit (INDEPENDENT_AMBULATORY_CARE_PROVIDER_SITE_OTHER): Payer: Medicare Other

## 2022-05-22 DIAGNOSIS — I318 Other specified diseases of pericardium: Secondary | ICD-10-CM

## 2022-05-22 LAB — ECHOCARDIOGRAM COMPLETE
Area-P 1/2: 4.39 cm2
Calc EF: 49.8 %
S' Lateral: 1.62 cm
Single Plane A2C EF: 52.1 %
Single Plane A4C EF: 49.6 %

## 2022-05-26 ENCOUNTER — Telehealth: Payer: Self-pay

## 2022-05-26 NOTE — Progress Notes (Signed)
Care Management & Coordination Services Pharmacy Team  Reason for Encounter: Appointment Reminder  Contacted patient to confirm telephone appointment with Cherylin Mylar, PharmD on 05-30-2022 at 11:00. Unsuccessful outreach. Left voicemail for patient to return call.  Chart review: Recent office visits:  05-02-2022 Little, Karma Lew, RN (CC).  Recent consult visits:  05-09-2022 Eldred Manges, MD (orthopedic surgery). XR lumbar spine 2-3 view ordered.  04-28-2022 Francee Gentile, MD (Rheumatology). Start plaquenil 200 mg daily, Diflucan 150 mg once weekly for 4 weeks and prednisone 5 mg daily.  04-25-2022 Butch Penny, NP (neurology). OSA on CPAP.  Hospital visits:  None in previous 6 months   Star Rating Drugs:  Ozempic 1 mg- PAP Telmisartan 80 mg- Last filled 03-14-2022 100 DS [facility]. Previous 01-03-2022 100 DS.  Care Gaps: Annual wellness visit in last year? Yes Eye exam overdue Covid booster overdue   Huey Romans [facility] Clinical Pharmacist Assistant [phone]

## 2022-05-30 ENCOUNTER — Ambulatory Visit: Payer: Medicare Other

## 2022-05-30 NOTE — Progress Notes (Signed)
Care Management & Coordination Services Pharmacy Note  05/30/2022 Name:  Michelle Hicks MRN:  [id number] DOB:  01/25/1951  Summary: Patient reports that she is still concerned about the cost of Repatha, however she does not have access to the letter that she received from [facility]. We also discussed the importance of   Recommendations/Changes made from today's visit: Recommend G72.0 is added to patients problem list for statin myopathy by PCP for this year Recommend patient pick up Repatha from the pharmacy  Follow up plan: Recommend patient having a half plate of veggies in all meals, increasing healthy protein sources for at least the next 4 weeks.  Collaborate with PCP to increase patient Ozempic to 2 mg once per week to start in the next 4 weeks  Patient is going to find out the cost of the Repatha at the pharmacy and look for the letter from [facility].    Subjective: Michelle Hicks is an 72 y.o. year old female who is a primary patient of Dorothyann Peng, MD.  The care coordination team was consulted for assistance with disease management and care coordination needs.    Engaged with patient by telephone for follow up visit.  Recent office visits:  05-02-2022 Little, Karma Lew, RN (CC).   Recent consult visits:  05-09-2022 Eldred Manges, MD (orthopedic surgery). XR lumbar spine 2-3 view ordered.   04-28-2022 Francee Gentile, MD (Rheumatology). Start plaquenil 200 mg daily, Diflucan 150 mg once weekly for 4 weeks and prednisone 5 mg daily.   04-25-2022 Butch Penny, NP (neurology). OSA on CPAP.   Hospital visits:  None in previous 6 months   Objective:  Lab Results  Component Value Date   CREATININE 0.82 01/03/2022   BUN 13 01/03/2022   EGFR 77 01/03/2022   GFRNONAA 73 03/24/2020   GFRAA 84 03/24/2020   NA 136 01/03/2022   K 4.1 01/03/2022   CALCIUM 9.8 01/03/2022   CO2 20 01/03/2022   GLUCOSE 93 01/03/2022    Lab Results  Component Value Date/Time   HGBA1C  6.8 (H) 04/14/2022 09:19 AM   HGBA1C 7.2 (H) 12/08/2021 10:33 AM   HGBA1C 7.3 09/04/2017 12:00 AM   MICROALBUR 10 07/06/2020 12:47 PM   MICROALBUR 30 03/24/2020 10:27 AM    Last diabetic Eye exam:  Lab Results  Component Value Date/Time   HMDIABEYEEXA No Retinopathy 01/11/2021 12:00 AM    Last diabetic Foot exam: No results found for: "HMDIABFOOTEX"   Lab Results  Component Value Date   CHOL 151 03/14/2022   HDL 85 03/14/2022   LDLCALC 51 03/14/2022   TRIG 77 03/14/2022   CHOLHDL 1.8 03/14/2022       Latest Ref Rng & Units 01/03/2022   10:42 AM 11/21/2021    9:05 AM 10/18/2021   12:00 AM  Hepatic Function  Total Protein 6.0 - 8.5 g/dL 7.0  7.4    Albumin 3.9 - 4.9 g/dL 4.4   4.1      AST 0 - 40 IU/L 27   29      ALT 0 - 32 IU/L 27   43      Alk Phosphatase 44 - 121 IU/L 62   62      Total Bilirubin 0.0 - 1.2 mg/dL 0.3        This result is from an external source.    Lab Results  Component Value Date/Time   TSH 0.876 06/06/2021 03:32 PM   TSH 1.020 10/06/2020 11:17 AM  FREET4 1.18 06/06/2021 03:32 PM   FREET4 1.12 10/06/2020 11:17 AM       Latest Ref Rng & Units 10/18/2021   12:00 AM 07/22/2021    9:10 AM 07/22/2021    9:06 AM  CBC  WBC  9.2        Hemoglobin 12.0 - 16.0 12.1     11.6  11.6    11.6   Hematocrit 36 - 46 37     34.0  34.0    34.0   Platelets 150 - 400 K/uL 299           This result is from an external source.   Multiple values from one day are sorted in reverse-chronological order    Lab Results  Component Value Date/Time   ZOXWRUEA54 1,502 (H) 12/30/2020 02:51 PM    Clinical ASCVD: No  The 10-year ASCVD risk score (Arnett DK, et al., 2019) is: 32%   Values used to calculate the score:     Age: 84 years     Sex: Female     Is Non-Hispanic African American: Yes     Diabetic: Yes     Tobacco smoker: Yes     Systolic Blood Pressure: 119 mmHg     Is BP treated: Yes     HDL Cholesterol: 85 mg/dL     Total Cholesterol: 151 mg/dL         0/98/1191   47:82 AM 03/31/2021    9:56 AM 03/24/2020   10:01 AM  Depression screen PHQ 2/9  Decreased Interest 0 0 0  Down, Depressed, Hopeless 0 0 0  PHQ - 2 Score 0 0 0     Social History   Tobacco Use  Smoking Status Former   Packs/day: 0.75   Years: 20.00   Additional pack years: 0.00   Total pack years: 15.00   Types: Cigarettes   Quit date: 2002   Years since quitting: 22.3   Passive exposure: Past  Smokeless Tobacco Never   BP Readings from Last 3 Encounters:  05/09/22 119/73  04/13/22 116/60  04/13/22 116/60   Pulse Readings from Last 3 Encounters:  04/13/22 94  04/13/22 94  03/06/22 94   Wt Readings from Last 3 Encounters:  05/09/22 240 lb (108.9 kg)  04/13/22 240 lb (108.9 kg)  04/13/22 240 lb 12.8 oz (109.2 kg)   BMI Readings from Last 3 Encounters:  05/09/22 41.20 kg/m  04/13/22 41.20 kg/m  04/13/22 40.57 kg/m    Allergies  Allergen Reactions   Statins     CK elevation   Sulfa Antibiotics Rash   Sulfamethoxazole-Trimethoprim Rash    Medications Reviewed Today     Reviewed by Marybelle Killings, RT (Technologist) on 05/09/22 at 1309  Med List Status: <None>   Medication Order Taking? Sig Documenting Provider Last Dose Status Informant  Accu-Chek Softclix Lancets lancets [id number] Yes Use as instructed to check blood sugars twice daily E11.69 Dorothyann Peng, MD Taking Active   amLODipine (NORVASC) 5 MG tablet [id number] Yes Take 1 tablet (5 mg total) by mouth daily. Chilton Si, MD Taking Active Self  aspirin EC 81 MG tablet [id number] No Take 81 mg by mouth daily. Some times  Patient not taking: Reported on 04/13/2022   [provider] Not Taking Active Self  Blood Glucose Monitoring Suppl (ACCU-CHEK GUIDE) w/Device KIT [id number] Yes Use to check blood sugars twice daily E11.69 Dorothyann Peng, MD Taking Active   Evolocumab [facility] SURECLICK) 140 MG/ML Ivory Broad [id number] No  Inject 140 mg into the skin every 14 (fourteen) days.   Patient not taking: Reported on 04/13/2022   Chilton Si, MD Not Taking Active   fluticasone [facility]) 50 MCG/ACT nasal spray [id number] Yes Place 1 spray into both nostrils daily. [provider] Taking Active Self  folic acid (FOLVITE) 1 MG tablet [id number] No Take 1 mg by mouth daily.  Patient not taking: Reported on 03/06/2022   [provider] Not Taking Active   glucose blood (ACCU-CHEK GUIDE) test strip [id number] Yes USE TO CHECK BLOOD SUGAR TWICE DAILY AS DIRECTED Dorothyann Peng, MD Taking Active   hydrochlorothiazide (HYDRODIURIL) 25 MG tablet [id number] Yes TAKE 1 TABLET BY MOUTH DAILY Dorothyann Peng, MD Taking Active   hydroxychloroquine (PLAQUENIL) 200 MG tablet [id number] Yes Take 200 mg by mouth daily. [provider] Taking Active Self  levothyroxine (SYNTHROID) 50 MCG tablet [id number] Yes Take 50 mcg by mouth daily.  [provider] Taking Active Self  metoprolol tartrate (LOPRESSOR) 25 MG tablet [id number] No Take 1 tablet (25 mg total) by mouth 2 (two) times daily as needed (For resting heart rate more than 120 bpm with palpitations).  Patient not taking: Reported on 04/13/2022   Alver Sorrow, NP Not Taking Active   Multiple Vitamin (MULTIVITAMIN) tablet [id number] Yes Take 1 tablet by mouth daily. [provider] Taking Active Self  omeprazole (PRILOSEC) 40 MG capsule [id number] No TAKE 1 CAPSULE BY MOUTH  DAILY BEFORE A MEAL  Patient not taking: Reported on 04/13/2022   Dorothyann Peng, MD Not Taking Active Self  predniSONE (DELTASONE) 10 MG tablet [id number] Yes Take 5 mg by mouth daily with breakfast. Taper [provider] Taking Active Self  Semaglutide, 1 MG/DOSE, (OZEMPIC, 1 MG/DOSE,) 2 MG/1.5ML SOPN [id number] Yes Inject 1 mg into the skin once a week. Dorothyann Peng, MD Taking Active Self  sodium chloride flush (NS) 0.9 % injection 3 mL [id number]   Chilton Si, MD  Active   telmisartan (MICARDIS) 80 MG tablet  [id number] Yes TAKE 1 TABLET BY MOUTH DAILY Dorothyann Peng, MD Taking Active   traZODone (DESYREL) 50 MG tablet [id number] Yes TAKE 1 TABLET BY MOUTH AT  BEDTIME AS NEEDED FOR SLEEP Dohmeier, Porfirio Mylar, MD Taking Active   Vitamin D, Cholecalciferol, 25 MCG (1000 UT) CAPS [id number] Yes Take by mouth. [provider] Taking Active Self            SDOH:  (Social Determinants of Health) assessments and interventions performed: No SDOH Interventions    Flowsheet Row Clinical Support from 04/13/2022 in [facility] Chronic Care Management from 08/18/2020 in [facility] Chronic Care Management from 08/06/2019 in [facility] Chronic Care Management from 07/25/2019 in [facility] Chronic Care Management from 07/24/2019 in [facility] Clinical Support from 07/10/2019 in [facility]  SDOH Interventions        Food Insecurity Interventions Intervention Not Indicated -- -- -- Intervention Not Indicated --  Housing Interventions -- -- -- -- Intervention Not Indicated --  Transportation Interventions Intervention Not Indicated -- -- -- Intervention Not Indicated --  Depression Interventions/Treatment  -- -- -- -- -- PHQ2-9 Score <4 Follow-up Not Indicated  Financial Strain Interventions Intervention Not Indicated Other (Comment)  [assistance completed for Ozempic] Other (Comment)  [Assisted patient with completing patient portion of Ozempic PAP application] Other (Comment)  [Will help patient with patient  assistance application for Ozempic] -- --  Physical Activity Interventions Patient Refused, Other (Comments) -- -- -- -- --  Stress Interventions Intervention Not Indicated -- -- -- -- --       Medication Assistance:  Ozempic obtained through [employer] medication assistance program.  Enrollment ends  01/2023  Medication Access: Within the past 30 days, how often has patient missed a dose of medication? She has not been taken  Is a pillbox or other method used to improve adherence? Yes  Factors that may affect medication adherence? financial need, nonadherence to medications, and lack of understanding of disease management Are meds synced by current pharmacy? No  Are meds delivered by current pharmacy? Yes  Does patient experience delays in picking up medications due to transportation concerns? No   [facility] Services Reviewed: Is patient disadvantaged to use [facility]?: Yes  Current Rx insurance plan: [facility] Medicare Name and location of Current pharmacy:  [facility] - [place], [place] - [address] [place] [address] Phone: [phone] Fax: [phone]  [id number] [place], [place] - [address] AT [facility] [address] & [facility] [address] [place] [address] Phone: [phone] Fax: [phone]  [facility] #[id number] - [place], [place] - [address] AT [facility] [place] [address] & [address] [place] [address] Phone: [phone] Fax: [phone]  [facility] services reviewed with patient today?: No  Patient requests to transfer care to [facility] Pharmacy?: No  Reason patient declined to change pharmacies: Disadvantaged due to insurance/mail order  Compliance/Adherence/Medication fill history: Care Gaps: Annual wellness visit in last year? Yes Eye exam overdue Covid booster overdue  Star-Rating Drugs: Ozempic 1 mg- PAP Telmisartan 80 mg- Last filled 03-14-2022 100 DS [facility]. Previous 01-03-2022 100 DS.   Assessment/Plan   Diabetes (A1c goal <7%) -Not ideally controlled -Current medications: Ozempic 1 mg once per week Appropriate, Effective, Safe, Accessible -Current home glucose readings fasting glucose: 4/16-100,  95, 90,  95 post  prandial glucose: 145, 130, 120, 173, 162 -Denies hypoglycemic/hyperglycemic symptoms -Current meal patterns:  breakfast: cup of coffee and a piece of apple sauce cake   lunch: left over [place] food, szechuan chicken and vegetables  dinner: BBQ sandwich, hamburger bun, hot sauce  snacks: cheetos similar  drinks: Water - 16.9 ounce bottles of water three times with plain water and some crystal light.  -Current exercise: she is doing mostly stretches, she using a peddler while she is sitting down and walking at a slow pace around 20 minutes.   -Educated on Complications of diabetes including kidney damage, retinal damage, and cardiovascular disease; Sick day management: glucometer readings QID until well and stable, maintain hydration with high fluid intake, and call MD if glucose above 400 Carbohydrate counting and/or plate method -She is going to increase the amount of fresh mix salads she is eating because she likes them in the summer, she is also going to use very little salad dressing -She is also going to measure out her portions using a cup, she is going to continue to cut back on starchy vegetables.  -Counseled to check feet daily and get yearly eye exams -Collaborated with PCP to change patients current Ozempic dose to 2 mg once per week for added weight loss benefit.   Hyperlipidemia: (LDL goal < 70) -Uncontrolled -Current treatment: Repatha 140 mg - inject into the skin every 14 days  Appropriate, Effective, Safe, Query accessible -Medications previously tried: statin- patient has statin myopathies and allergies   -Current dietary patterns: she is eating small -Current exercise habits: patient was unable to finish PREP program  -Educated on Cholesterol goals;  Importance of limiting foods high in cholesterol; -Patient reports that at this point she is not interested in the Repatha because there is a concern that there will be an additional expense with her other medications once  she is in the donut hole.  -She is going to try find the letter  -Educated on benefits and risk of   Cherylin Mylar, CPP, PharmD Clinical Pharmacist Practitioner [facility] [phone]

## 2022-05-31 ENCOUNTER — Telehealth: Payer: Self-pay

## 2022-05-31 NOTE — Progress Notes (Addendum)
[employer] patient assistance program notification:  120- day supply of Ozempic  was filled on 05/23/2022 and  should arrive to the office in 10-14 business days. Patient has 1  refill remaining and enrollment will expire on 02/13/2023.   Change request form for increase of Ozempic to  filled out by Cherylin Mylar, Pharm D, awaiting provider signature to fax.    Billee Cashing, CMA Clinical Pharmacist Assistant [phone]

## 2022-05-31 NOTE — Progress Notes (Addendum)
[employer] patient assistance program notification:  120- day supply of Ozempic 1 mg was filled on 05/22/2022 and  should arrive to the office in 10-14 business days. Patient enrollment will expire on 02/13/2023.    Billee Cashing, CMA Clinical Pharmacist Assistant [phone]

## 2022-05-31 NOTE — Telephone Encounter (Signed)
Ms. Guadiana called me back  to inform me that the cost for Repatha at the pharmacy is 141 dollars for a 90 day supply. She reports that this might be feasible for this fill, because the cost went down by 100 dolllars this year. She is going to pick up the medication from the pharmacy today.   [employer]: requires proof income for each person in the household and prior authorization confirmation. Per the patient you can call them back and/or fax the information.Patient reports that she is open to calling [employer] now for to confirm information, Phone [phone].  [facility], they report receipt of financial documentation but do need prior authorization approval letter faxed to the foundation. They report it will take 24-48 business hours for approval. The case number is [id number], fax number [phone].  H  Plan: HC to request letter from insurance so this information can be sent to [facility].   Cherylin Mylar, CPP, PharmD Clinical Pharmacist Practitioner [facility] [phone]

## 2022-06-02 ENCOUNTER — Telehealth: Payer: Self-pay

## 2022-06-02 NOTE — Telephone Encounter (Signed)
Informed patient of Ozempic 1 mg medication is here at the clinic, she voiced understanding and will pick it up.  Cherylin Mylar, CPP, PharmD Clinical Pharmacist Practitioner [facility] [phone]

## 2022-06-06 ENCOUNTER — Other Ambulatory Visit: Payer: Self-pay | Admitting: Neurology

## 2022-06-13 ENCOUNTER — Ambulatory Visit (INDEPENDENT_AMBULATORY_CARE_PROVIDER_SITE_OTHER): Payer: Medicare Other | Admitting: Internal Medicine

## 2022-06-13 ENCOUNTER — Encounter: Payer: Self-pay | Admitting: Internal Medicine

## 2022-06-13 ENCOUNTER — Telehealth: Payer: Self-pay

## 2022-06-13 VITALS — BP 132/84 | HR 92 | Temp 98.4°F | Ht 64.0 in | Wt 241.4 lb

## 2022-06-13 DIAGNOSIS — G4733 Obstructive sleep apnea (adult) (pediatric): Secondary | ICD-10-CM

## 2022-06-13 DIAGNOSIS — R0602 Shortness of breath: Secondary | ICD-10-CM | POA: Diagnosis not present

## 2022-06-13 DIAGNOSIS — R0982 Postnasal drip: Secondary | ICD-10-CM

## 2022-06-13 DIAGNOSIS — M545 Low back pain, unspecified: Secondary | ICD-10-CM | POA: Diagnosis not present

## 2022-06-13 DIAGNOSIS — G8929 Other chronic pain: Secondary | ICD-10-CM

## 2022-06-13 DIAGNOSIS — R5383 Other fatigue: Secondary | ICD-10-CM | POA: Diagnosis not present

## 2022-06-13 DIAGNOSIS — E039 Hypothyroidism, unspecified: Secondary | ICD-10-CM | POA: Diagnosis not present

## 2022-06-13 DIAGNOSIS — Z6841 Body Mass Index (BMI) 40.0 and over, adult: Secondary | ICD-10-CM

## 2022-06-13 DIAGNOSIS — J309 Allergic rhinitis, unspecified: Secondary | ICD-10-CM

## 2022-06-13 MED ORDER — AZELASTINE HCL 0.1 % NA SOLN: 1.0000 | Freq: Two times a day (BID) | NASAL | 12 refills | Status: DC

## 2022-06-13 NOTE — Progress Notes (Signed)
I,Michelle Hicks,acting as a scribe for Michelle Aliment, MD.,have documented all relevant documentation on the behalf of Michelle Aliment, MD,as directed by  Michelle Aliment, MD while in the presence of Michelle Aliment, MD.    Subjective:     Patient ID: Michelle Hicks , female    DOB: [birth year] , 72 y.o.   MRN: [id number]   Chief Complaint  Patient presents with   Shortness of Breath   Fatigue    HPI  Patient presents today for evaluation of worsening fatigue and sob. States she is SOB upon awakening.  She does have OSA, reports compliance with CPAP most nights. She does feel well rested upon awakening. Does not have DOE; however, admits she is not doing a lot of moving due to chronic back pain.   She reports experiencing the SOB sporadically, she admits it happens more when she gets up from bed in the morning. Initially started a couple of weeks ago.  She reports feeling fatigue for a couple of months.   She also notices her nails are more brittle. She feels this issue seems to be progressing. She does have a rheumatologist she visits. She reports having a recent visit on 04/28/2022.     Shortness of Breath This is a recurrent problem. The current episode started 1 to 4 weeks ago. The problem has been unchanged. Associated symptoms include rhinorrhea. Pertinent negatives include no abdominal pain.     Past Medical History:  Diagnosis Date   Depression    DM (diabetes mellitus) (HCC)    Exertional dyspnea 05/30/2021   HTN (hypertension)    Palpitations 01/19/2022   Pericardial calcification 07/12/2021   Tachycardia 05/30/2021     Family History  Problem Relation Age of Onset   Diabetes Mother    Pancreatic cancer Mother    Hypertension Father    Heart attack Father 18   Breast cancer Neg Hx      Current Outpatient Medications:    azelastine (ASTELIN) 0.1 % nasal spray, Place 1 spray into both nostrils 2 (two) times daily. Use in each nostril as directed,  Disp: 30 mL, Rfl: 12   Accu-Chek Softclix Lancets lancets, Use as instructed to check blood sugars twice daily E11.69, Disp: 180 each, Rfl: 5   amLODipine (NORVASC) 5 MG tablet, TAKE 1 TABLET(5 MG) BY MOUTH DAILY, Disp: 90 tablet, Rfl: 3   Blood Glucose Monitoring Suppl (ACCU-CHEK GUIDE) w/Device KIT, Use to check blood sugars twice daily E11.69, Disp: 1 kit, Rfl: 1   Evolocumab (REPATHA SURECLICK) 140 MG/ML SOAJ, Inject 140 mg into the skin every 14 (fourteen) days. (Patient not taking: Reported on 04/13/2022), Disp: 6 mL, Rfl: 3   fluticasone (FLONASE) 50 MCG/ACT nasal spray, Place 1 spray into both nostrils daily., Disp: , Rfl:    glucose blood (ACCU-CHEK GUIDE) test strip, USE TO CHECK BLOOD SUGAR TWICE DAILY AS DIRECTED, Disp: 100 strip, Rfl: 3   hydrochlorothiazide (HYDRODIURIL) 25 MG tablet, TAKE 1 TABLET BY MOUTH DAILY, Disp: 100 tablet, Rfl: 2   hydroxychloroquine (PLAQUENIL) 200 MG tablet, Take 200 mg by mouth daily., Disp: , Rfl:    levothyroxine (SYNTHROID) 50 MCG tablet, Take 50 mcg by mouth daily. , Disp: , Rfl:    metoprolol tartrate (LOPRESSOR) 25 MG tablet, Take 1 tablet (25 mg total) by mouth 2 (two) times daily as needed (For resting heart rate more than 120 bpm with palpitations). (Patient not taking: Reported on 04/13/2022), Disp: 30 tablet, Rfl: 2  Multiple Vitamin (MULTIVITAMIN) tablet, Take 1 tablet by mouth daily., Disp: , Rfl:    predniSONE (DELTASONE) 10 MG tablet, Take 5 mg by mouth daily with breakfast. Taper, Disp: , Rfl:    Semaglutide, 1 MG/DOSE, (OZEMPIC, 1 MG/DOSE,) 2 MG/1.5ML SOPN, Inject 1 mg into the skin once a week., Disp: 4.5 mL, Rfl: 3   telmisartan (MICARDIS) 80 MG tablet, TAKE 1 TABLET BY MOUTH DAILY, Disp: 100 tablet, Rfl: 2   traZODone (DESYREL) 50 MG tablet, TAKE 1 TABLET(50 MG) BY MOUTH AT BEDTIME AS NEEDED FOR SLEEP, Disp: 90 tablet, Rfl: 1   Vitamin D, Cholecalciferol, 25 MCG (1000 UT) CAPS, Take by mouth., Disp: , Rfl:   Current Facility-Administered  Medications:    sodium chloride flush (NS) 0.9 % injection 3 mL, 3 mL, Intravenous, Q12H, Chilton Si, MD   Allergies  Allergen Reactions   Statins     CK elevation   Sulfa Antibiotics Rash   Sulfamethoxazole-Trimethoprim Rash     Review of Systems  Constitutional: Negative.   HENT:  Positive for postnasal drip and rhinorrhea.   Respiratory:  Positive for shortness of breath.   Cardiovascular: Negative.   Gastrointestinal:  Negative for abdominal pain.  Neurological: Negative.   Psychiatric/Behavioral: Negative.       Today's Vitals   06/13/22 1553  BP: 132/84  Pulse: 92  Temp: 98.4 F (36.9 C)  SpO2: 98%  Weight: 241 lb 6.4 oz (109.5 kg)  Height: 5\' 4"  (1.626 m)   Body mass index is 41.44 kg/m.  Wt Readings from Last 3 Encounters:  06/13/22 241 lb 6.4 oz (109.5 kg)  05/09/22 240 lb (108.9 kg)  04/13/22 240 lb (108.9 kg)    Objective:  Physical Exam Vitals and nursing note reviewed.  Constitutional:      Appearance: Normal appearance. She is well-developed. She is obese.  HENT:     Head: Normocephalic and atraumatic.  Cardiovascular:     Rate and Rhythm: Normal rate and regular rhythm.     Heart sounds: Normal heart sounds.  Pulmonary:     Effort: Pulmonary effort is normal.     Breath sounds: Normal breath sounds.  Skin:    General: Skin is warm.  Neurological:     General: No focal deficit present.     Mental Status: She is alert.  Psychiatric:        Mood and Affect: Mood normal.        Behavior: Behavior normal.         Assessment And Plan:     1. SOB (shortness of breath) Comments: Labs reviewed in Care Everywhere, hb low in December 2023. I will recheck CBC today. April 2024 echo results reviewed. - CBC no Diff  2. Other fatigue Comments: She is encouraged to stay well hydrated. Vitamin B12 level elevated Dec 2023. It is possible she has suboptimal levels of thyroid supplementation. - TSH  3. Allergic rhinitis with postnasal  drip Comments: She will c/w Zyrtec and Flonase nightly. I will add Astelin NS to her current regimen. Advised to decrease intake of dairy products.  4. OSA (obstructive sleep apnea) Comments: Reports compliance with CPAP, wears at least 4 hours/night. Encouraged to go to DME company to get download. Advised this could contribute to her sx.  5. Chronic right-sided low back pain without sciatica Comments: She declines Pain referral. Prefers to wait on surgery, per patient Ortho has recommended weight loss prior to surgery.  6. Primary hypothyroidism Comments: Chronic, I will  check thyroid panel and adjust meds as needed. - T4, Free  7. Class 3 severe obesity due to excess calories with serious comorbidity and body mass index (BMI) of 40.0 to 44.9 in adult [facility]) Comments: She reports difficulty exercising due to chronic back pain. Encouraged to perform chair exercises while watching TV.  Patient was given opportunity to ask questions. Patient verbalized understanding of the plan and was able to repeat key elements of the plan. All questions were answered to their satisfaction.   I, Michelle Aliment, MD, have reviewed all documentation for this visit. The documentation on 06/13/22 for the exam, diagnosis, procedures, and orders are all accurate and complete.   IF YOU HAVE BEEN REFERRED TO A SPECIALIST, IT MAY TAKE 1-2 WEEKS TO SCHEDULE/PROCESS THE REFERRAL. IF YOU HAVE NOT HEARD FROM US/SPECIALIST IN TWO WEEKS, PLEASE GIVE [place] A CALL AT [phone].   THE PATIENT IS ENCOURAGED TO PRACTICE SOCIAL DISTANCING DUE TO THE COVID-19 PANDEMIC.

## 2022-06-13 NOTE — Patient Instructions (Signed)
Shortness of Breath, Adult Shortness of breath means you have trouble breathing. Shortness of breath could be a sign of a medical problem. Follow these instructions at home:  Pollution Do not smoke or use any products that contain nicotine or tobacco. If you need help quitting, ask your doctor. Avoid things that can make it harder to breathe, such as: Smoke of all kinds. This includes smoke from campfires or forest fires. Do not smoke or allow others to smoke in your home. Mold. Dust. Air pollution. Chemical smells. Things that can give you an allergic reaction (allergens) if you have allergies. Keep your living space clean. Use products that help remove mold and dust. General instructions Watch for any changes in your symptoms. Take over-the-counter and prescription medicines only as told by your doctor. This includes oxygen therapy and inhaled medicines. Rest as needed. Return to your normal activities when your doctor says that it is safe. Keep all follow-up visits. Contact a doctor if: Your condition does not get better as soon as expected. You have a hard time doing your normal activities, even after you rest. You have new symptoms. You cannot walk up stairs. You cannot exercise the way you normally do. Get help right away if: Your shortness of breath gets worse. You have trouble breathing when you are resting. You feel light-headed or you faint. You have a cough that is not helped by medicines. You cough up blood. You have pain with breathing. You have pain in your chest, arms, shoulders, or belly (abdomen). You have a fever. These symptoms may be an emergency. Get help right away. Call 911. Do not wait to see if the symptoms will go away. Do not drive yourself to the hospital. Summary Shortness of breath is when you have trouble breathing enough air. It can be a sign of a medical problem. Avoid things that make it hard for you to breathe, such as smoking, pollution,  mold, and dust. Watch for any changes in your symptoms. Contact your doctor if you do not get better or you get worse. This information is not intended to replace advice given to you by your health care provider. Make sure you discuss any questions you have with your health care provider. Document Revised: 09/18/2020 Document Reviewed: 09/18/2020 Elsevier Patient Education  2023 Elsevier Inc.  

## 2022-06-13 NOTE — Progress Notes (Addendum)
As of 06/05/2022 patient picked up 4 boxes of Ozempic, [employer] patient assistance from PCP office.   Called [facility] pharmacy line to get prior authorization details for Repatha faxed to PCP office so I can fax to [employer] for medication assistance. Spoke with representative, they were able to find prior authorization and will fax to office and informed me that this authorization expires 06/27/2022 and wanted to know if I need to start another one. Representative is processing a new prior authorization request through CoverMyMeds and I will complete there to get patient approval continued for another year. But before call ended, representative was unable to pull authorization letter and noticed that this was completed by medical plan and expire in December, authorization information provided: Auhorization #[id number] from 02/13/2022 to 02/13/2023.  I called [employer] to get letter or Repatha approval but they were unable to find it and tried to transfer me back to pharmacy. Call disconnected. I went back on Covermymeds to try and restart authorization for verification, awaiting determination.   Billee Cashing, CMA Clinical Pharmacist Assistant [phone]

## 2022-06-14 ENCOUNTER — Ambulatory Visit: Payer: Medicare Other | Admitting: Internal Medicine

## 2022-06-14 LAB — CBC
Hematocrit: 34.5 % (ref 34.0–46.6)
Hemoglobin: 11.5 g/dL (ref 11.1–15.9)
MCH: 27.9 pg (ref 26.6–33.0)
MCHC: 33.3 g/dL (ref 31.5–35.7)
MCV: 84 fL (ref 79–97)
Platelets: 281 10*3/uL (ref 150–450)
RBC: 4.12 x10E6/uL (ref 3.77–5.28)
RDW: 12.3 % (ref 11.7–15.4)
WBC: 6.3 10*3/uL (ref 3.4–10.8)

## 2022-06-14 LAB — TSH: TSH: 0.92 u[IU]/mL (ref 0.450–4.500)

## 2022-06-14 LAB — T4, FREE: Free T4: 1.22 ng/dL (ref 0.82–1.77)

## 2022-06-15 ENCOUNTER — Ambulatory Visit: Payer: Medicare Other | Admitting: Internal Medicine

## 2022-06-15 ENCOUNTER — Telehealth: Payer: Self-pay

## 2022-06-15 NOTE — Progress Notes (Signed)
Request Reference Number: [id number]. REPATHA SURE INJ 140MG /ML is approved through 02/13/2023. Your patient may now fill this prescription and it will be covered.. Authorization Expiration Date: February 13, 2023.  Faxed approval letter to [employer].    Billee Cashing, CMA Clinical Pharmacist Assistant [phone]

## 2022-06-30 ENCOUNTER — Telehealth: Payer: Self-pay

## 2022-06-30 NOTE — Progress Notes (Signed)
Care Management & Coordination Services Pharmacy Team  Reason for Encounter: Appointment Reminder  Contacted patient to confirm telephone appointment with Cherylin Mylar, PharmD on 07-04-2022 at 10:00. Spoke with patient on 06/30/2022   Do you have any problems getting your medications? No  What is your top health concern you would like to discuss at your upcoming visit? Patient stated no  Have you seen any other providers since your last visit with PCP? No   Chart review: Recent office visits:  06-13-2022 Dorothyann Peng, MD. Visit for shortness of breath. Start azelastine 1 spray in each nostril twice daily. Patient reported not taking aspirin, folic acid and omeprazole.  Recent consult visits:  None  Hospital visits:  None in previous 6 months   Star Rating Drugs:  Ozempic 1 mg- PAP Telmisartan 80 mg- Last filled 03-14-2022 100 DS [facility]. Previous 01-03-2022 100 DS. (Contacted [facility] and refill is being processed)   Care Gaps: Annual wellness visit in last year? Yes  If Diabetic: Last eye exam / retinopathy screening: 01-12-2022 Last diabetic foot exam: None   Huey Romans [facility] Clinical Pharmacist Assistant [phone]

## 2022-07-03 ENCOUNTER — Encounter (HOSPITAL_BASED_OUTPATIENT_CLINIC_OR_DEPARTMENT_OTHER): Payer: Self-pay | Admitting: Family

## 2022-07-03 ENCOUNTER — Ambulatory Visit (HOSPITAL_BASED_OUTPATIENT_CLINIC_OR_DEPARTMENT_OTHER): Payer: Medicare Other | Admitting: Family

## 2022-07-03 VITALS — BP 128/70 | HR 78 | Ht 64.0 in | Wt 244.0 lb

## 2022-07-03 DIAGNOSIS — E785 Hyperlipidemia, unspecified: Secondary | ICD-10-CM

## 2022-07-03 DIAGNOSIS — G4733 Obstructive sleep apnea (adult) (pediatric): Secondary | ICD-10-CM | POA: Diagnosis not present

## 2022-07-03 DIAGNOSIS — R002 Palpitations: Secondary | ICD-10-CM | POA: Diagnosis not present

## 2022-07-03 DIAGNOSIS — I10 Essential (primary) hypertension: Secondary | ICD-10-CM | POA: Diagnosis not present

## 2022-07-03 NOTE — Progress Notes (Signed)
Office Visit    Patient Name: Michelle Hicks Date of Encounter: 07/03/2022  PCP:  Dorothyann Peng, MD   [facility]  Cardiologist:  Chilton Si, MD  Advanced Practice Provider:  No care team member to display Electrophysiologist:  None      Chief Complaint    Michelle Hicks is a 72 y.o. female presents today for follow-up of palpitations  Past Medical History    Past Medical History:  Diagnosis Date   Depression    DM (diabetes mellitus) (HCC)    Exertional dyspnea 05/30/2021   HTN (hypertension)    Palpitations 01/19/2022   Pericardial calcification 07/12/2021   Tachycardia 05/30/2021   Past Surgical History:  Procedure Laterality Date   ABDOMINAL HYSTERECTOMY     RIGHT/LEFT HEART CATH AND CORONARY ANGIOGRAPHY N/A 07/22/2021   Procedure: RIGHT/LEFT HEART CATH AND CORONARY ANGIOGRAPHY;  Surgeon: Tonny Bollman, MD;  Location: [facility] INVASIVE CV LAB;  Service: Cardiovascular;  Laterality: N/A;   TONSILLECTOMY Bilateral 1967   Allergies  Allergies  Allergen Reactions   Statins     CK elevation   Sulfa Antibiotics Rash   Sulfamethoxazole-Trimethoprim Rash    History of Present Illness    [name] is a 72 y.o. female with a hx of HTN, DM2, OSA, palpitations, depression last seen 03/06/2022  Ecoh 05/2021 LVEF 60-65%, mild LVH, moderate hypertrophy of basal septum, normal diastolic function. Coronary CTA 05/2021 normal coronary arteries but did not thickening and calcification of pericardium. [facility] 07/2021 normal coronaries and no evidence of constriction.   Saw Dr. Lydia Guiles 01/19/22 noting palpitations with heart rate above 140 bpm at rest.  30-day monitor placed with preliminary report revealing predominantly normal sinus rhythm with average heart rate of 87 bpm with rare PVC/PAC <1% burden.  One 6 beat run of VT which she thinks might have been at the time she was taking off the monitor.  This was not a triggered episode.  At  follow-up 03/04/2022 palpitations were overall not bothersome and she was given Rx for as needed metoprolol tartrate.  Presents today for follow-up independently.  Feeling overall well since last seen.  Does note she continues to have a back issue that surgical repair is medicated on weight loss.  Has been maintained on semaglutide 1 mg for some time.  Encouraged to discuss further increasing dose with PCP.  We discussed referral to physical therapy for water therapy which she will consider.  No palpitations, chest pain, dyspnea. She is using her CPAP regularly. She trying a new mask. Notes she will sleep 3 hours then be awake for a couple of hours. Waking up to use the restroom.    EKGs/Labs/Other Studies Reviewed:   The following studies were reviewed today: Cardiac Studies & Procedures   CARDIAC CATHETERIZATION  CARDIAC CATHETERIZATION 07/22/2021  Narrative Angiographically normal coronary arteries (left dominant) Normal/low right and left heart pressures No ventricular interdependence with simultaneous LV/RV pressure recording  Normal study - no hemodynamic evidence for constrictive pericarditis  Findings Coronary Findings Diagnostic  Dominance: Left  Left Main Vessel is angiographically normal.  Ramus Intermedius Vessel is angiographically normal.  Left Circumflex Vessel is angiographically normal.  Right Coronary Artery Vessel is angiographically normal.  Intervention  No interventions have been documented.     ECHOCARDIOGRAM  ECHOCARDIOGRAM COMPLETE 05/22/2022  Narrative ECHOCARDIOGRAM REPORT    Patient Name:   Michelle Hicks Date of Exam: 05/22/2022 Medical Rec #:  [id number]  Height:       64.0 in Accession #:    [id number]       Weight:       240.0 lb Date of Birth:  Jan 18, 1951        BSA:          2.114 m Patient Age:    71 years         BP:           125/75 mmHg Patient Gender: F                HR:           85 bpm. Exam Location:   Outpatient  Procedure: 2D Echo, 3D Echo, Color Doppler, Cardiac Doppler and Strain Analysis  Indications:    Pericardial Disorder  History:        Patient has prior history of Echocardiogram examinations, most recent 06/09/2021. Risk Factors:Hypertension, Diabetes, Former Smoker and Dyslipidemia. Pericardial calcification.  Sonographer:    Jeryl Columbia RDCS Referring Phys: [id number] TIFFANY [place]  IMPRESSIONS   1. Left ventricular ejection fraction, by estimation, is 60 to 65%. The left ventricle has normal function. The left ventricle has no regional wall motion abnormalities. There is mild left ventricular hypertrophy. Left ventricular diastolic parameters are consistent with Grade I diastolic dysfunction (impaired relaxation). 2. Right ventricular systolic function is normal. The right ventricular size is normal. Tricuspid regurgitation signal is inadequate for assessing PA pressure. 3. The mitral valve is normal in structure. No evidence of mitral valve regurgitation. No evidence of mitral stenosis. 4. The aortic valve was not well visualized. Aortic valve regurgitation is not visualized. Aortic valve sclerosis/calcification is present, without any evidence of aortic stenosis. 5. The inferior vena cava is normal in size with greater than 50% respiratory variability, suggesting right atrial pressure of 3 mmHg. 6. No evidence of constrictive pericarditis  FINDINGS Left Ventricle: Left ventricular ejection fraction, by estimation, is 60 to 65%. The left ventricle has normal function. The left ventricle has no regional wall motion abnormalities. The left ventricular internal cavity size was normal in size. There is mild left ventricular hypertrophy. Left ventricular diastolic parameters are consistent with Grade I diastolic dysfunction (impaired relaxation).  Right Ventricle: The right ventricular size is normal. No increase in right ventricular wall thickness. Right ventricular  systolic function is normal. Tricuspid regurgitation signal is inadequate for assessing PA pressure.  Left Atrium: Left atrial size was normal in size.  Right Atrium: Right atrial size was normal in size.  Pericardium: There is no evidence of pericardial effusion.  Mitral Valve: The mitral valve is normal in structure. No evidence of mitral valve regurgitation. No evidence of mitral valve stenosis.  Tricuspid Valve: The tricuspid valve is normal in structure. Tricuspid valve regurgitation is not demonstrated.  Aortic Valve: The aortic valve was not well visualized. Aortic valve regurgitation is not visualized. Aortic valve sclerosis/calcification is present, without any evidence of aortic stenosis.  Pulmonic Valve: The pulmonic valve was grossly normal. Pulmonic valve regurgitation is trivial.  Aorta: The aortic root is normal in size and structure.  Venous: The inferior vena cava is normal in size with greater than 50% respiratory variability, suggesting right atrial pressure of 3 mmHg.  IAS/Shunts: The interatrial septum was not well visualized.   LEFT VENTRICLE PLAX 2D LVIDd:         3.56 cm     Diastology LVIDs:         1.62 cm  LV e' medial:    6.74 cm/s LV PW:         1.14 cm     LV E/e' medial:  9.9 LV IVS:        1.03 cm     LV e' lateral:   6.42 cm/s LVOT diam:     2.00 cm     LV E/e' lateral: 10.4 LV SV:         50 LV SV Index:   24 LVOT Area:     3.14 cm  LV Volumes (MOD) LV vol d, MOD A2C: 91.9 ml LV vol d, MOD A4C: 78.0 ml LV vol s, MOD A2C: 44.0 ml LV vol s, MOD A4C: 39.3 ml LV SV MOD A2C:     47.9 ml LV SV MOD A4C:     78.0 ml LV SV MOD BP:      42.6 ml  RIGHT VENTRICLE RV Basal diam:  3.31 cm RV Mid diam:    2.91 cm RV S prime:     8.81 cm/s TAPSE (M-mode): 2.2 cm  LEFT ATRIUM           Index        RIGHT ATRIUM           Index LA diam:      3.80 cm 1.80 cm/m   RA Area:     12.50 cm LA Vol (A4C): 47.2 ml 22.33 ml/m  RA Volume:   27.20 ml   12.87 ml/m AORTIC VALVE LVOT Vmax:   86.90 cm/s LVOT Vmean:  57.000 cm/s LVOT VTI:    0.160 m  AORTA Ao Root diam: 3.10 cm Ao Asc diam:  3.60 cm  MITRAL VALVE MV Area (PHT): 4.39 cm    SHUNTS MV Decel Time: 173 msec    Systemic VTI:  0.16 m MV E velocity: 66.60 cm/s  Systemic Diam: 2.00 cm MV A velocity: 95.90 cm/s MV E/A ratio:  0.69  Epifanio Lesches MD Electronically signed by Epifanio Lesches MD Signature Date/Time: 05/22/2022/8:56:54 PM    Final    MONITORS  CARDIAC EVENT MONITOR 04/02/2022  Narrative 30 Day Event Monitor  Quality: Fair.  Baseline artifact. Predominant rhythm: sinus rhythm Average heart rate: 87 bpm Max heart rate: 159 bpm Min heart rate: 67 bpm  Rare PACs and PVCs 7 beats NSVT  Tiffany C. Duke Salvia, MD, [facility] 04/02/2022 1:01 PM   CT SCANS  CT CORONARY MORPH W/CTA COR W/SCORE 06/11/2021  Addendum 06/11/2021 11:17 AM ADDENDUM REPORT: 06/11/2021 11:15  HISTORY: Dyspnea on exertion (DOE)  EXAM: Cardiac/Coronary  CT  TECHNIQUE: The patient was scanned on a [employer].  PROTOCOL: A 120 kV prospective scan was triggered in the descending thoracic aorta at 111 HU's. Axial non-contrast 3 mm slices were carried out through the heart. The data set was analyzed on a dedicated work station and scored using the Agatston method. Gantry rotation speed was 250 msecs and collimation was .6 mm. Beta blockade and 0.8 mg of sl NTG was given. The 3D data set was reconstructed in 5% intervals of the 35-75 % of the R-R cycle. Systolic and diastolic phases were analyzed on a dedicated work station using MPR, MIP and VRT modes. The patient received OMNIPAQUE IOHEXOL 350 MG/ML SOLN contrast.  FINDINGS: Image quality: Average  Noise artifact is: Moderate - reduced signal to noise ratio due to body habitus.  Coronary calcium score is 0.  Coronary arteries: Normal coronary origins.  Left dominance.  Right  Coronary Artery:  Small caliber, nondominant RCA with no detectable plaque or stenosis.  Left Main Coronary Artery: No detectable plaque or stenosis.  Left Anterior Descending Coronary Artery: No detectable plaque or stenosis.  Left Circumflex Artery: No detectable plaque or stenosis.  Aorta: Normal size, 33 mm at the mid ascending aorta (level of the PA bifurcation) measured double oblique. No calcifications. No dissection.  Aortic Valve: No calcifications.  Other findings:  Scattered pericardial calcifications circumferentially. No significant pericardial thickening, measures 2 mm in maximum thickness in region of superior calcification. For indication of dyspnea on exertion, consider evaluation for constrictive pericarditis. Mild tubular deformation of the left ventricle seen on this exam, though subtle.  Normal pulmonary vein drainage into the left atrium.  Normal left atrial appendage without thrombus.  Mild dilation of main pulmonary artery, 31 mm, may suggest increased pulmonary pressure.  IMPRESSION: 1. Tortuous coronary arteries with no evidence of CAD, CADRADS = 0. Image quality degraded by reduced signal to noise ratio, no definite proximal or mid plaque or coronary calcifications seen.  2. Coronary calcium score of 0.  3. Normal coronary origins with left dominance.  4. Scattered pericardial calcifications seen circumferentially. For indication of dyspnea on exertion, consider evaluation for constrictive pericarditis.  5. Mild dilation of main pulmonary artery, 31 mm, may suggest increased pulmonary pressure.   Electronically Signed By: Weston Brass M.D. On: 06/11/2021 11:15  Narrative EXAM: OVER-READ INTERPRETATION  CT CHEST  The following report is an over-read performed by radiologist Dr. Jeronimo Greaves of [facility] on 06/10/2021. This over-read does not include interpretation of cardiac or coronary anatomy or pathology. The coronary CTA  interpretation by the cardiologist is attached.  COMPARISON:  None.  FINDINGS: Vascular: Aortic atherosclerosis. No central pulmonary embolism, on this non-dedicated study.  Mediastinum/Nodes: No imaged thoracic adenopathy. Moderate wall thickening involving the mid and distal esophagus. The proximal to mid esophagus is dilated with fluid level within including on 07/12.  Lungs/Pleura: No pleural fluid. Right middle lobe subpleural 3 mm pulmonary nodule on 20/13.  Upper Abdomen: Marked hepatic steatosis. Normal imaged portions of the spleen, stomach.  Musculoskeletal: No acute osseous abnormality. Advanced thoracic spondylosis.  IMPRESSION: 1.  No acute findings in the imaged extracardiac chest. 2. Upper esophageal air fluid level suggests dysmotility or gastroesophageal reflux. Distal esophageal wall thickening suggests esophagitis. 3. Hepatic steatosis. 4. Aortic Atherosclerosis (ICD10-I70.0). 5. 3 mm right middle lobe pulmonary nodule. No follow-up needed if patient is low-risk. Non-contrast chest CT can be considered in 12 months if patient is high-risk. This recommendation follows the consensus statement: Guidelines for Management of Incidental Pulmonary Nodules Detected on CT Images: From the Fleischner Society 2017; Radiology 2017; 284:228-243.  Electronically Signed: By: Jeronimo Greaves M.D. On: 06/10/2021 16:46           EKG:  EKG is ordered today.  EKG performed today demonstrates NSR 78 bpm with no acute ST/T wave changes.  Recent Labs: 01/03/2022: ALT 27; BUN 13; Creatinine, Ser 0.82; Potassium 4.1; Sodium 136 06/13/2022: Hemoglobin 11.5; Platelets 281; TSH 0.920  Recent Lipid Panel    Component Value Date/Time   CHOL 151 03/14/2022 0928   TRIG 77 03/14/2022 0928   HDL 85 03/14/2022 0928   CHOLHDL 1.8 03/14/2022 0928   LDLCALC 51 03/14/2022 0928    Home Medications   Current Meds  Medication Sig   Accu-Chek Softclix Lancets lancets Use as  instructed to check blood sugars twice daily E11.69   amLODipine (NORVASC) 5  MG tablet TAKE 1 TABLET(5 MG) BY MOUTH DAILY   Blood Glucose Monitoring Suppl (ACCU-CHEK GUIDE) w/Device KIT Use to check blood sugars twice daily E11.69   Evolocumab (REPATHA SURECLICK) 140 MG/ML SOAJ Inject 140 mg into the skin every 14 (fourteen) days.   fluticasone (FLONASE) 50 MCG/ACT nasal spray Place 1 spray into both nostrils daily.   glucose blood (ACCU-CHEK GUIDE) test strip USE TO CHECK BLOOD SUGAR TWICE DAILY AS DIRECTED   hydrochlorothiazide (HYDRODIURIL) 25 MG tablet TAKE 1 TABLET BY MOUTH DAILY   hydroxychloroquine (PLAQUENIL) 200 MG tablet Take 200 mg by mouth daily.   levothyroxine (SYNTHROID) 50 MCG tablet Take 50 mcg by mouth daily.    Multiple Vitamin (MULTIVITAMIN) tablet Take 1 tablet by mouth daily.   Semaglutide, 1 MG/DOSE, (OZEMPIC, 1 MG/DOSE,) 2 MG/1.5ML SOPN Inject 1 mg into the skin once a week.   telmisartan (MICARDIS) 80 MG tablet TAKE 1 TABLET BY MOUTH DAILY   traZODone (DESYREL) 50 MG tablet TAKE 1 TABLET(50 MG) BY MOUTH AT BEDTIME AS NEEDED FOR SLEEP   Vitamin D, Cholecalciferol, 25 MCG (1000 UT) CAPS Take by mouth.   Current Facility-Administered Medications for the 07/03/22 encounter (Office Visit) with Alver Sorrow, NP  Medication   sodium chloride flush (NS) 0.9 % injection 3 mL     Review of Systems      All other systems reviewed and are otherwise negative except as noted above.  Physical Exam    VS:  BP 128/70   Pulse 78   Ht 5\' 4"  (1.626 m)   Wt 244 lb (110.7 kg)   BMI 41.88 kg/m  , BMI Body mass index is 41.88 kg/m.  Wt Readings from Last 3 Encounters:  07/03/22 244 lb (110.7 kg)  06/13/22 241 lb 6.4 oz (109.5 kg)  05/09/22 240 lb (108.9 kg)     GEN: Well nourished, overweight, well developed, in no acute distress. HEENT: normal. Neck: Supple, no JVD, carotid bruits, or masses. Cardiac: RRR, no murmurs, rubs, or gallops. No clubbing, cyanosis, edema.   Radials/PT 2+ and equal bilaterally.  Respiratory:  Respirations regular and unlabored, clear to auscultation bilaterally. GI: Soft, nontender, nondistended. MS: No deformity or atrophy. Skin: Warm and dry, no rash. Neuro:  Strength and sensation are intact. Psych: Normal affect.  Assessment & Plan    HTN-BP well controlled. Continue current antihypertensive regimen amlodipine 5 mg daily, hydrochlorothiazide 25 mg daily, telmisartan 80 mg daily.  Pericardial calcification- No tamponade.  No heart failure symptoms today.  Continue to monitor.  Echocardiogram 4/24 normal LVEF, grade 1 diastolic dysfunction, no significant valvular maladies and no evidence of constrictive pericarditis.  OSA on CPAP-endorses using CPAP regularly. At least 4 hours most of the night.  A new mask.  Palpitations -prior monitor with no significant arrhythmias.  She has not required her as needed metoprolol and as such requesting remove it from her med list.  Encouraged to stable hydrated, avoid caffeine, manage stress well.  HLD - LDL goal <100 given DM2. tolerating Repatha without issue.  03/14/2022 total cholesterol 151, triglycerides 77, HDL 85, LDL 51.  All numbers at goal.  Prior elevation in CK with statin.  Morbid obesity/DM2- Weight loss via diet and exercise encouraged. Discussed the impact being overweight would have on cardiovascular risk.  DM2 followed by PCP.  Encouraged her to discuss further increasing semaglutide dose with PCP.  We discussed possible referral to physical therapy for water therapy due to back issues limiting exercise which she  will consider.  Platelet declines referral today.         Disposition: Follow up  06/2022  with Chilton Si, MD or APP.  Signed, Alver Sorrow, NP 07/03/2022, 11:41 AM [facility]

## 2022-07-03 NOTE — Patient Instructions (Addendum)
Medication Instructions:  Continue your current medications.   *If you need a refill on your cardiac medications before your next appointment, please call your pharmacy*  Follow-Up: At [facility], you and your health needs are our priority.  As part of our continuing mission to provide you with exceptional heart care, we have created designated Provider Care Teams.  These Care Teams include your primary Cardiologist (physician) and Advanced Practice Providers (APPs -  Physician Assistants and Nurse Practitioners) who all work together to provide you with the care you need, when you need it.  We recommend signing up for the patient portal called "MyChart".  Sign up information is provided on this After Visit Summary.  MyChart is used to connect with patients for Virtual Visits (Telemedicine).  Patients are able to view lab/test results, encounter notes, upcoming appointments, etc.  Non-urgent messages can be sent to your provider as well.   To learn more about what you can do with MyChart, go to [email or web address].    Your next appointment:   6-8 month(s)  Provider:   Tiffany [place], MD or Durrel Mcnee, NP    Other Instructions      

## 2022-07-04 ENCOUNTER — Ambulatory Visit: Payer: Medicare Other

## 2022-07-04 NOTE — Progress Notes (Signed)
Care Management & Coordination Services Pharmacy Note  07/04/2022 Name:  Michelle Hicks MRN:  [id number] DOB:  08-Oct-1950  Summary: Michelle Hicks will start taking Ozempic 2 mg once per week once shipment is processed through patient assistance. She will increase her water to 64 ounces per day for the next 6 months.   Recommendations/Changes made from today's visit: Recommend patient continue current medication regimen  Recommend increasing Ozempic 2 mg once per week   Follow up plan: Michelle Hicks is going to walk outside and in store for 45 minutes to and hour three days per week  Begin taking Ozempic 2 mg once per week after patient assistance shipment is received  Michelle Hicks is going to increase her water intake to 64 ounces per day for the next 6 months.  HC  will call patient every other month starting in June to for a diabetes assessment and to review how she is doing with Ozempic.    Subjective: Michelle Hicks is an 72 y.o. year old female who is a primary patient of Dorothyann Peng, MD.  The care coordination team was consulted for assistance with disease management and care coordination needs.  Patient reports that she feels like she is craving sweets more often than usual.   Engaged with patient by telephone for follow up visit.  Recent office visits:  06-13-2022 Dorothyann Peng, MD. Visit for shortness of breath. Start azelastine 1 spray in each nostril twice daily. Patient reported not taking aspirin, folic acid and omeprazole.   Recent consult visits:  None   Hospital visits:  None in previous 6 months   Objective:  Lab Results  Component Value Date   CREATININE 0.82 01/03/2022   BUN 13 01/03/2022   EGFR 77 01/03/2022   GFRNONAA 73 03/24/2020   GFRAA 84 03/24/2020   NA 136 01/03/2022   K 4.1 01/03/2022   CALCIUM 9.8 01/03/2022   CO2 20 01/03/2022   GLUCOSE 93 01/03/2022    Lab Results  Component Value Date/Time   HGBA1C 6.8 (H) 04/14/2022 09:19 AM    HGBA1C 7.2 (H) 12/08/2021 10:33 AM   HGBA1C 7.3 09/04/2017 12:00 AM   MICROALBUR 10 07/06/2020 12:47 PM   MICROALBUR 30 03/24/2020 10:27 AM    Last diabetic Eye exam:  Lab Results  Component Value Date/Time   HMDIABEYEEXA No Retinopathy 01/12/2022 12:00 AM    Last diabetic Foot exam: No results found for: "HMDIABFOOTEX"   Lab Results  Component Value Date   CHOL 151 03/14/2022   HDL 85 03/14/2022   LDLCALC 51 03/14/2022   TRIG 77 03/14/2022   CHOLHDL 1.8 03/14/2022       Latest Ref Rng & Units 01/03/2022   10:42 AM 11/21/2021    9:05 AM 10/18/2021   12:00 AM  Hepatic Function  Total Protein 6.0 - 8.5 g/dL 7.0  7.4    Albumin 3.9 - 4.9 g/dL 4.4   4.1      AST 0 - 40 IU/L 27   29      ALT 0 - 32 IU/L 27   43      Alk Phosphatase 44 - 121 IU/L 62   62      Total Bilirubin 0.0 - 1.2 mg/dL 0.3        This result is from an external source.    Lab Results  Component Value Date/Time   TSH 0.920 06/13/2022 04:57 PM   TSH 0.876 06/06/2021 03:32 PM   FREET4 1.22 06/13/2022  04:57 PM   FREET4 1.18 06/06/2021 03:32 PM       Latest Ref Rng & Units 06/13/2022    4:57 PM 10/18/2021   12:00 AM 07/22/2021    9:10 AM  CBC  WBC 3.4 - 10.8 x10E3/uL 6.3  9.2       Hemoglobin 11.1 - 15.9 g/dL 16.1  09.6     04.5   Hematocrit 34.0 - 46.6 % 34.5  37     34.0   Platelets 150 - 450 x10E3/uL 281  299          This result is from an external source.    Lab Results  Component Value Date/Time   WUJWJXBJ47 1,502 (H) 12/30/2020 02:51 PM    Clinical ASCVD: No  The 10-year ASCVD risk score (Arnett DK, et al., 2019) is: 36.1%   Values used to calculate the score:     Age: 47 years     Sex: Female     Is Non-Hispanic African American: Yes     Diabetic: Yes     Tobacco smoker: Yes     Systolic Blood Pressure: 128 mmHg     Is BP treated: Yes     HDL Cholesterol: 85 mg/dL     Total Cholesterol: 151 mg/dL        10/12/5619    3:08 PM 04/13/2022   10:34 AM 03/31/2021    9:56 AM   Depression screen PHQ 2/9  Decreased Interest 0 0 0  Down, Depressed, Hopeless 0 0 0  PHQ - 2 Score 0 0 0  Altered sleeping 0    Tired, decreased energy 0    Change in appetite 0    Feeling bad or failure about yourself  0    Trouble concentrating 0    Moving slowly or fidgety/restless 0    Suicidal thoughts 0    PHQ-9 Score 0    Difficult doing work/chores Not difficult at all       Social History   Tobacco Use  Smoking Status Former   Packs/day: 0.75   Years: 20.00   Additional pack years: 0.00   Total pack years: 15.00   Types: Cigarettes   Quit date: 2002   Years since quitting: 22.4   Passive exposure: Past  Smokeless Tobacco Never   BP Readings from Last 3 Encounters:  07/03/22 128/70  06/13/22 132/84  05/09/22 119/73   Pulse Readings from Last 3 Encounters:  07/03/22 78  06/13/22 92  04/13/22 94   Wt Readings from Last 3 Encounters:  07/03/22 244 lb (110.7 kg)  06/13/22 241 lb 6.4 oz (109.5 kg)  05/09/22 240 lb (108.9 kg)   BMI Readings from Last 3 Encounters:  07/03/22 41.88 kg/m  06/13/22 41.44 kg/m  05/09/22 41.20 kg/m    Allergies  Allergen Reactions   Statins     CK elevation   Sulfa Antibiotics Rash   Sulfamethoxazole-Trimethoprim Rash    Medications Reviewed Today     Reviewed by Alver Sorrow, NP (Nurse Practitioner) on 07/03/22 at 1143  Med List Status: <None>   Medication Order Taking? Sig Documenting Provider Last Dose Status Informant  Accu-Chek Softclix Lancets lancets [id number] Yes Use as instructed to check blood sugars twice daily E11.69 Dorothyann Peng, MD Taking Active   amLODipine (NORVASC) 5 MG tablet [id number] Yes TAKE 1 TABLET(5 MG) BY MOUTH DAILY Chilton Si, MD Taking Active   Blood Glucose Monitoring Suppl (ACCU-CHEK GUIDE) w/Device KIT [id number] Yes Use to check  blood sugars twice daily E11.69 Dorothyann Peng, MD Taking Active   Evolocumab [facility] SURECLICK) 140 MG/ML Ivory Broad [id number] Yes Inject 140 mg into  the skin every 14 (fourteen) days. Chilton Si, MD Taking Active   fluticasone [facility]) 50 MCG/ACT nasal spray [id number] Yes Place 1 spray into both nostrils daily. [provider] Taking Active Self  glucose blood (ACCU-CHEK GUIDE) test strip [id number] Yes USE TO CHECK BLOOD SUGAR TWICE DAILY AS DIRECTED Dorothyann Peng, MD Taking Active   hydrochlorothiazide (HYDRODIURIL) 25 MG tablet [id number] Yes TAKE 1 TABLET BY MOUTH DAILY Dorothyann Peng, MD Taking Active   hydroxychloroquine (PLAQUENIL) 200 MG tablet [id number] Yes Take 200 mg by mouth daily. [provider] Taking Active Self  levothyroxine (SYNTHROID) 50 MCG tablet [id number] Yes Take 50 mcg by mouth daily.  [provider] Taking Active Self  Multiple Vitamin (MULTIVITAMIN) tablet [id number] Yes Take 1 tablet by mouth daily. [provider] Taking Active Self  Semaglutide, 1 MG/DOSE, (OZEMPIC, 1 MG/DOSE,) 2 MG/1.5ML SOPN [id number] Yes Inject 1 mg into the skin once a week. Dorothyann Peng, MD Taking Active Self  sodium chloride flush (NS) 0.9 % injection 3 mL [id number]   Chilton Si, MD  Active   telmisartan (MICARDIS) 80 MG tablet [id number] Yes TAKE 1 TABLET BY MOUTH DAILY Dorothyann Peng, MD Taking Active   traZODone (DESYREL) 50 MG tablet [id number] Yes TAKE 1 TABLET(50 MG) BY MOUTH AT BEDTIME AS NEEDED FOR SLEEP Dohmeier, Porfirio Mylar, MD Taking Active   Vitamin D, Cholecalciferol, 25 MCG (1000 UT) CAPS [id number] Yes Take by mouth. [provider] Taking Active Self            SDOH:  (Social Determinants of Health) assessments and interventions performed: No SDOH Interventions    Flowsheet Row Clinical Support from 04/13/2022 in [facility] Chronic Care Management from 08/18/2020 in [facility] Chronic Care Management from 08/06/2019 in [facility] Chronic Care Management from 07/25/2019  in [facility] Chronic Care Management from 07/24/2019 in [facility] Clinical Support from 07/10/2019 in [facility]  SDOH Interventions        Food Insecurity Interventions Intervention Not Indicated -- -- -- Intervention Not Indicated --  Housing Interventions -- -- -- -- Intervention Not Indicated --  Transportation Interventions Intervention Not Indicated -- -- -- Intervention Not Indicated --  Depression Interventions/Treatment  -- -- -- -- -- PHQ2-9 Score <4 Follow-up Not Indicated  Financial Strain Interventions Intervention Not Indicated Other (Comment)  [assistance completed for Ozempic] Other (Comment)  [Assisted patient with completing patient portion of Ozempic PAP application] Other (Comment)  [Will help patient with patient assistance application for Ozempic] -- --  Physical Activity Interventions Patient Refused, Other (Comments) -- -- -- -- --  Stress Interventions Intervention Not Indicated -- -- -- -- --       Medication Assistance:  Ozempic obtained through [employer] medication assistance program.  Enrollment ends 01/2023  Medication Access: Name and location of current pharmacy:  [id number] - [place], [place] - [address] AT [facility] OF [address] [place] [address] Phone: [phone] Fax: [phone]  Within the past 30 days, how often has patient missed a dose of medication? None Is a pillbox or other method used to improve adherence? Yes  Factors that may affect medication adherence? financial need  and lack of understanding of disease management Are meds synced by current pharmacy? No  Are meds delivered by current pharmacy? No  Does patient experience delays in picking up medications due to transportation concerns? No   Compliance/Adherence/Medication fill history: Care Gaps: Annual wellness  visit in last year? Yes   If Diabetic: Last eye exam / retinopathy screening: 01-12-2022 Last diabetic foot exam: None  Star-Rating Drugs: Star Rating Drugs:  Ozempic 1 mg- PAP Telmisartan 80 mg- Last filled 03-14-2022 100 DS [facility]. Previous 01-03-2022 100 DS. (Contacted [facility] and refill is being processed)   Assessment/Plan   Diabetes (A1c goal <7%) -Controlled -Current medications: Ozempic 1 mg - inject once per week Appropriate, Effective, Safe, Accessible  -Current home glucose readings fasting glucose: 112, 103  post prandial glucose: 145-125 -Denies hypoglycemic/hyperglycemic symptoms -Current meal patterns:  breakfast: oatmeal, brown sugar, blueberries with walnuts   dinner: baked chicken legs, green beans, and potato salad  snacks: sliced up cantoloupe, dannon light and fit greek yogurt drinks: Diet Ocean Spray, Crystal light packets with water, about 35 ounces of water yesterday  -Current exercise: she has started more walking  -Educated on A1c and blood sugar goals; Prevention and management of hypoglycemic episodes; -Counseled on dietary and exercise methods -Counseled to check feet daily and get yearly eye exams -Recommended to continue current medication  Cherylin Mylar, CPP, PharmD Clinical Pharmacist Practitioner [facility] [phone]

## 2022-07-18 ENCOUNTER — Ambulatory Visit (INDEPENDENT_AMBULATORY_CARE_PROVIDER_SITE_OTHER): Payer: Medicare Other | Admitting: Internal Medicine

## 2022-07-18 ENCOUNTER — Encounter: Payer: Self-pay | Admitting: Internal Medicine

## 2022-07-18 VITALS — BP 118/74 | HR 78 | Temp 98.6°F | Ht 64.0 in | Wt 240.0 lb

## 2022-07-18 DIAGNOSIS — R911 Solitary pulmonary nodule: Secondary | ICD-10-CM | POA: Diagnosis not present

## 2022-07-18 DIAGNOSIS — Z6841 Body Mass Index (BMI) 40.0 and over, adult: Secondary | ICD-10-CM

## 2022-07-18 DIAGNOSIS — Z0001 Encounter for general adult medical examination with abnormal findings: Secondary | ICD-10-CM

## 2022-07-18 DIAGNOSIS — G4733 Obstructive sleep apnea (adult) (pediatric): Secondary | ICD-10-CM | POA: Diagnosis not present

## 2022-07-18 DIAGNOSIS — G8929 Other chronic pain: Secondary | ICD-10-CM

## 2022-07-18 DIAGNOSIS — I10 Essential (primary) hypertension: Secondary | ICD-10-CM

## 2022-07-18 DIAGNOSIS — Z0000 Encounter for general adult medical examination without abnormal findings: Secondary | ICD-10-CM

## 2022-07-18 DIAGNOSIS — Z87891 Personal history of nicotine dependence: Secondary | ICD-10-CM

## 2022-07-18 DIAGNOSIS — M545 Low back pain, unspecified: Secondary | ICD-10-CM

## 2022-07-18 DIAGNOSIS — E66813 Obesity, class 3: Secondary | ICD-10-CM

## 2022-07-18 DIAGNOSIS — R778 Other specified abnormalities of plasma proteins: Secondary | ICD-10-CM

## 2022-07-18 DIAGNOSIS — E1169 Type 2 diabetes mellitus with other specified complication: Secondary | ICD-10-CM | POA: Diagnosis not present

## 2022-07-18 DIAGNOSIS — Z79899 Other long term (current) drug therapy: Secondary | ICD-10-CM

## 2022-07-18 DIAGNOSIS — E785 Hyperlipidemia, unspecified: Secondary | ICD-10-CM | POA: Diagnosis not present

## 2022-07-18 NOTE — Patient Instructions (Signed)

## 2022-07-18 NOTE — Progress Notes (Unsigned)
I,[name],acting as a scribe for Gwynneth Aliment, MD.,have documented all relevant documentation on the behalf of Gwynneth Aliment, MD,as directed by  Gwynneth Aliment, MD while in the presence of Gwynneth Aliment, MD.   Subjective:     Patient ID: Michelle Hicks , female    DOB: [birth year] , 72 y.o.   MRN: [id number]   Chief Complaint  Patient presents with   Annual Exam   Diabetes   Hypertension   Hypothyroidism    HPI  She is here today for a full physical examination. She is followed by [facility] for her GYN exams. She reports compliance with meds. Denies headache, chest pain, and SOB.   Patient completed EKG on 07/03/2022 with cardiologist.   She also requests wanting a cane & shower chair. She admits feeling unstable when in the shower. Also having to use a cane sometimes for support. She is not sure if this is related to her myopathy; however, she states rheumatologist advised her that she was initially misdiagnosed. She does not have myositis. She was advised that neuropathy is the cause of her symptoms. She wants to know what treatment she can take for this.   Diabetes She presents for her follow-up diabetic visit. She has type 2 diabetes mellitus. Her disease course has been stable. There are no hypoglycemic associated symptoms. Pertinent negatives for diabetes include no blurred vision. There are no hypoglycemic complications. Symptoms are stable. Risk factors for coronary artery disease include diabetes mellitus, dyslipidemia, hypertension, post-menopausal, sedentary lifestyle and obesity. She is compliant with treatment most of the time. She is following a generally healthy diet. She participates in exercise intermittently. An ACE inhibitor/angiotensin II receptor blocker is being taken.  Hypertension This is a chronic problem. The current episode started more than 1 year ago. The problem has been gradually improving since onset. The problem is controlled. Pertinent  negatives include no blurred vision. The current treatment provides moderate improvement.  u7   Past Medical History:  Diagnosis Date   Depression    DM (diabetes mellitus) (HCC)    Exertional dyspnea 05/30/2021   HTN (hypertension)    Palpitations 01/19/2022   Pericardial calcification 07/12/2021   Tachycardia 05/30/2021     Family History  Problem Relation Age of Onset   Diabetes Mother    Pancreatic cancer Mother    Hypertension Father    Heart attack Father 40   Breast cancer Neg Hx      Current Outpatient Medications:    Accu-Chek Softclix Lancets lancets, Use as instructed to check blood sugars twice daily E11.69, Disp: 180 each, Rfl: 5   amLODipine (NORVASC) 5 MG tablet, TAKE 1 TABLET(5 MG) BY MOUTH DAILY, Disp: 90 tablet, Rfl: 3   Blood Glucose Monitoring Suppl (ACCU-CHEK GUIDE) w/Device KIT, Use to check blood sugars twice daily E11.69, Disp: 1 kit, Rfl: 1   Evolocumab (REPATHA SURECLICK) 140 MG/ML SOAJ, Inject 140 mg into the skin every 14 (fourteen) days., Disp: 6 mL, Rfl: 3   fluticasone (FLONASE) 50 MCG/ACT nasal spray, Place 1 spray into both nostrils daily., Disp: , Rfl:    glucose blood (ACCU-CHEK GUIDE) test strip, USE TO CHECK BLOOD SUGAR TWICE DAILY AS DIRECTED, Disp: 100 strip, Rfl: 3   hydrochlorothiazide (HYDRODIURIL) 25 MG tablet, TAKE 1 TABLET BY MOUTH DAILY, Disp: 100 tablet, Rfl: 2   hydroxychloroquine (PLAQUENIL) 200 MG tablet, Take 200 mg by mouth daily., Disp: , Rfl:    levothyroxine (SYNTHROID) 50 MCG tablet, Take  50 mcg by mouth daily. , Disp: , Rfl:    Multiple Vitamin (MULTIVITAMIN) tablet, Take 1 tablet by mouth daily., Disp: , Rfl:    Semaglutide, 1 MG/DOSE, (OZEMPIC, 1 MG/DOSE,) 2 MG/1.5ML SOPN, Inject 1 mg into the skin once a week., Disp: 4.5 mL, Rfl: 3   telmisartan (MICARDIS) 80 MG tablet, TAKE 1 TABLET BY MOUTH DAILY, Disp: 100 tablet, Rfl: 2   traZODone (DESYREL) 50 MG tablet, TAKE 1 TABLET(50 MG) BY MOUTH AT BEDTIME AS NEEDED FOR SLEEP,  Disp: 90 tablet, Rfl: 1   Vitamin D, Cholecalciferol, 25 MCG (1000 UT) CAPS, Take by mouth., Disp: , Rfl:   Current Facility-Administered Medications:    sodium chloride flush (NS) 0.9 % injection 3 mL, 3 mL, Intravenous, Q12H, Chilton Si, MD   Allergies  Allergen Reactions   Statins     CK elevation   Sulfa Antibiotics Rash   Sulfamethoxazole-Trimethoprim Rash      The patient states she uses post menopausal status for birth control. Last LMP was No LMP recorded. Patient has had a hysterectomy.. Negative for Dysmenorrhea. Negative for: breast discharge, breast lump(s), breast pain and breast self exam. Associated symptoms include abnormal vaginal bleeding. Pertinent negatives include abnormal bleeding (hematology), anxiety, decreased libido, depression, difficulty falling sleep, dyspareunia, history of infertility, nocturia, sexual dysfunction, sleep disturbances, urinary incontinence, urinary urgency, vaginal discharge and vaginal itching. Diet regular.The patient states her exercise level is    . The patient's tobacco use is:  Social History   Tobacco Use  Smoking Status Former   Packs/day: 0.75   Years: 20.00   Additional pack years: 0.00   Total pack years: 15.00   Types: Cigarettes   Quit date: 2002   Years since quitting: 22.4   Passive exposure: Past  Smokeless Tobacco Never  . She has been exposed to passive smoke. The patient's alcohol use is:  Social History   Substance and Sexual Activity  Alcohol Use Yes   Comment: socially     Review of Systems  Constitutional: Negative.   HENT: Negative.    Eyes: Negative.  Negative for blurred vision.  Respiratory: Negative.    Cardiovascular: Negative.   Gastrointestinal: Negative.   Endocrine: Negative.   Genitourinary: Negative.   Musculoskeletal: Negative.        She requests cane/shower chair, she feels she may be weak due to underlying myositis.   Skin: Negative.   Allergic/Immunologic: Negative.    Neurological: Negative.   Hematological: Negative.   Psychiatric/Behavioral: Negative.       Today's Vitals   07/18/22 1049  BP: 118/74  Pulse: 78  Temp: 98.6 F (37 C)  SpO2: 98%  Weight: 240 lb (108.9 kg)  Height: 5\' 4"  (1.626 m)   Body mass index is 41.2 kg/m.  Wt Readings from Last 3 Encounters:  07/18/22 240 lb (108.9 kg)  07/03/22 244 lb (110.7 kg)  06/13/22 241 lb 6.4 oz (109.5 kg)    Objective:  Physical Exam Vitals and nursing note reviewed.  Constitutional:      Appearance: Normal appearance. She is obese.  HENT:     Head: Normocephalic and atraumatic.     Right Ear: Tympanic membrane, ear canal and external ear normal.     Left Ear: Tympanic membrane, ear canal and external ear normal.     Nose: Nose normal.     Mouth/Throat:     Mouth: Mucous membranes are moist.     Pharynx: Oropharynx is clear.  Eyes:  Extraocular Movements: Extraocular movements intact.     Conjunctiva/sclera: Conjunctivae normal.     Pupils: Pupils are equal, round, and reactive to light.  Cardiovascular:     Rate and Rhythm: Normal rate and regular rhythm.     Pulses: Normal pulses.          Dorsalis pedis pulses are 2+ on the right side and 2+ on the left side.     Heart sounds: Normal heart sounds.  Pulmonary:     Effort: Pulmonary effort is normal.     Breath sounds: Normal breath sounds.  Chest:  Breasts:    Tanner Score is 5.     Right: Normal.     Left: Normal.  Abdominal:     General: Bowel sounds are normal.     Palpations: Abdomen is soft.     Comments: Obese, soft  Genitourinary:    Comments: deferred Musculoskeletal:        General: Normal range of motion.     Cervical back: Normal range of motion and neck supple.  Feet:     Right foot:     Protective Sensation: 5 sites tested.  5 sites sensed.     Skin integrity: Dry skin present.     Toenail Condition: Right toenails are normal.     Left foot:     Protective Sensation: 5 sites tested.  5 sites  sensed.     Skin integrity: Dry skin present.     Toenail Condition: Left toenails are normal.  Skin:    General: Skin is warm and dry.  Neurological:     General: No focal deficit present.     Mental Status: She is alert and oriented to person, place, and time.  Psychiatric:        Mood and Affect: Mood normal.        Behavior: Behavior normal.         Assessment And Plan:     1. Encounter for annual health examination Comments: A full exam was performed. Importance of monthly self breast exams was discussed with the patient.  PATIENT IS ADVISED TO GET 30-45 MINUTES REGULAR EXERCISE NO LESS THAN FOUR TO FIVE DAYS PER WEEK - BOTH WEIGHTBEARING EXERCISES AND AEROBIC ARE RECOMMENDED.  PATIENT IS ADVISED TO FOLLOW A HEALTHY DIET WITH AT LEAST SIX FRUITS/VEGGIES PER DAY, DECREASE INTAKE OF RED MEAT, AND TO INCREASE FISH INTAKE TO TWO DAYS PER WEEK.  MEATS/FISH SHOULD NOT BE FRIED, BAKED OR BROILED IS PREFERABLE.  IT IS ALSO IMPORTANT TO CUT BACK ON YOUR SUGAR INTAKE. PLEASE AVOID ANYTHING WITH ADDED SUGAR, CORN SYRUP OR OTHER SWEETENERS. IF YOU MUST USE A SWEETENER, YOU CAN TRY STEVIA. IT IS ALSO IMPORTANT TO AVOID ARTIFICIALLY SWEETENERS AND DIET BEVERAGES. LASTLY, I SUGGEST WEARING SPF 50 SUNSCREEN ON EXPOSED PARTS AND ESPECIALLY WHEN IN THE DIRECT SUNLIGHT FOR AN EXTENDED PERIOD OF TIME.  PLEASE AVOID FAST FOOD RESTAURANTS AND INCREASE YOUR WATER INTAKE.   2. Dyslipidemia associated with type 2 diabetes mellitus (HCC) Comments: Chronic, diabetic foot exam was performed. I will send prescription for Ozempic 2mg  for patient assistance. Until it arrives, she will use 1.5mg  weekly.  I DISCUSSED WITH THE PATIENT AT LENGTH REGARDING THE GOALS OF GLYCEMIC CONTROL AND POSSIBLE LONG-TERM COMPLICATIONS.  I  ALSO STRESSED THE IMPORTANCE OF COMPLIANCE WITH HOME GLUCOSE MONITORING, DIETARY RESTRICTIONS INCLUDING AVOIDANCE OF SUGARY DRINKS/PROCESSED FOODS,  ALONG WITH REGULAR EXERCISE.  I  ALSO STRESSED THE  IMPORTANCE OF ANNUAL EYE EXAMS, SELF  FOOT CARE AND COMPLIANCE WITH OFFICE VISITS. - POCT Urinalysis Dipstick (81002) - CMP14+EGFR - Hemoglobin A1c - Urine microalbumin-creatinine with uACR  3. Essential hypertension, benign Comments: Chronic, well controlled.  She will c/w telmisartan 80mg  and hctz 25mg  daily. Advised to follow low sodium diet. May 2024 EKG reviewed.  She is encouraged to follow low sodium diet.   4. OSA (obstructive sleep apnea) Importance of CPAP compliance was discussed with the patient.   5. Chronic right-sided low back pain without sciatica Comments: Chronic, now followed by Dr. Ophelia Charter. I offered to schedule her for MRI, prefers to have this handled by dr. Ophelia Charter. She failed PT due to pain.  6. Lung nodule seen on imaging study Comments: 3 mm right middle lobe pulmonary nodule noted on April 2023 Coronary CTA. She has h/o tobacco use. I will check repeat CT chest. - CT Chest Wo Contrast; Future  7. Abnormal SPEP Comments: Also with POS M-Spike. I referred her to Hematology; however, they declined eval due to underlying rheumatologic issue. I will repeat labs and possibly refer her to another provider. This issue could be contributing to her neuropathy. Will also review EMG/NCS results.  - Protein electrophoresis, serum  8. Class 3 severe obesity due to excess calories with serious comorbidity and body mass index (BMI) of 40.0 to 44.9 in adult [facility]) Comments: BMI 41. She is encouraged to initially strive for BMI less than 35 to decrease cardiac risk. Advised to aim for at least 150 minutes of exercise per week.  9. Drug therapy - Vitamin B12  10. History of tobacco use - CT Chest Wo Contrast; Future    Return for 1 YEAR HM, 4 MONTH BP & DM . Patient was given opportunity to ask questions. Patient verbalized understanding of the plan and was able to repeat key elements of the plan. All questions were answered to their satisfaction.   I, Gwynneth Aliment, MD, have  reviewed all documentation for this visit. The documentation on 07/18/22 for the exam, diagnosis, procedures, and orders are all accurate and complete.   THE PATIENT IS ENCOURAGED TO PRACTICE SOCIAL DISTANCING DUE TO THE COVID-19 PANDEMIC.

## 2022-07-19 DIAGNOSIS — R2689 Other abnormalities of gait and mobility: Secondary | ICD-10-CM | POA: Diagnosis not present

## 2022-07-21 LAB — CMP14+EGFR
ALT: 29 IU/L (ref 0–32)
AST: 34 IU/L (ref 0–40)
Albumin/Globulin Ratio: 1.5 (ref 1.2–2.2)
Albumin: 4.5 g/dL (ref 3.9–4.9)
Alkaline Phosphatase: 76 IU/L (ref 44–121)
BUN/Creatinine Ratio: 13 (ref 12–28)
BUN: 11 mg/dL (ref 8–27)
Bilirubin Total: 0.2 mg/dL (ref 0.0–1.2)
CO2: 24 mmol/L (ref 20–29)
Calcium: 10.2 mg/dL (ref 8.7–10.3)
Chloride: 104 mmol/L (ref 96–106)
Creatinine, Ser: 0.87 mg/dL (ref 0.57–1.00)
Globulin, Total: 3 g/dL (ref 1.5–4.5)
Glucose: 95 mg/dL (ref 70–99)
Potassium: 4.7 mmol/L (ref 3.5–5.2)
Sodium: 142 mmol/L (ref 134–144)
Total Protein: 7.5 g/dL (ref 6.0–8.5)
eGFR: 72 mL/min/{1.73_m2} (ref >59)

## 2022-07-21 LAB — PROTEIN ELECTROPHORESIS, SERUM
A/G Ratio: 1 (ref 0.7–1.7)
Albumin ELP: 3.7 g/dL (ref 2.9–4.4)
Alpha 1: 0.3 g/dL (ref 0.0–0.4)
Alpha 2: 0.8 g/dL (ref 0.4–1.0)
Beta: 1.3 g/dL (ref 0.7–1.3)
Gamma Globulin: 1.4 g/dL (ref 0.4–1.8)
Globulin, Total: 3.8 g/dL (ref 2.2–3.9)
M-Spike, %: 0.4 g/dL — ABNORMAL HIGH

## 2022-07-21 LAB — MICROALBUMIN / CREATININE URINE RATIO
Creatinine, Urine: 43.3 mg/dL
Microalb/Creat Ratio: <7 mg/g creat (ref 0–29)
Microalbumin, Urine: <3 ug/mL

## 2022-07-21 LAB — HEMOGLOBIN A1C
Est. average glucose Bld gHb Est-mCnc: 140 mg/dL
Hgb A1c MFr Bld: 6.5 % — ABNORMAL HIGH (ref 4.8–5.6)

## 2022-07-21 LAB — VITAMIN B12: Vitamin B-12: >2000 pg/mL — ABNORMAL HIGH (ref 232–1245)

## 2022-07-22 ENCOUNTER — Other Ambulatory Visit: Payer: Self-pay | Admitting: Internal Medicine

## 2022-07-22 DIAGNOSIS — R778 Other specified abnormalities of plasma proteins: Secondary | ICD-10-CM

## 2022-07-25 LAB — POCT URINALYSIS DIPSTICK
Bilirubin, UA: NEGATIVE
Blood, UA: NEGATIVE
Glucose, UA: NEGATIVE
Ketones, UA: NEGATIVE
Leukocytes, UA: NEGATIVE
Nitrite, UA: NEGATIVE
Protein, UA: NEGATIVE
Spec Grav, UA: 1.01 (ref 1.010–1.025)
Urobilinogen, UA: 0.2 E.U./dL
pH, UA: 5.5 (ref 5.0–8.0)

## 2022-07-27 ENCOUNTER — Ambulatory Visit: Payer: Self-pay

## 2022-07-27 NOTE — Patient Instructions (Signed)
Visit Information  Thank you for taking time to visit with me today. Please don't hesitate to contact me if I can be of assistance to you.   Following are the goals we discussed today:   Goals Addressed               This Visit's Progress     Patient Stated     I would like to have my abnormal labs evaluated (pt-stated)        Care Coordination Interventions: Evaluation of current treatment plan related to Myositis and patient's adherence to plan as established by provider Determined patient continues to take Plaquenil as directed with no effectiveness Discussed she continues to follow up with Dr. Sharmon Revere for management of her joint pain  Counseled on the importance of reporting any/all new or changed pain symptoms or management strategies to pain management provider Advised patient to report to care team affect of pain on daily activities Reviewed with patient prescribed pharmacological and nonpharmacological pain relief strategies Advised patient to discuss alternative therapies with provider      Other     To continue to work on lowering A1c        Care Coordination Interventions: Review of patient status, including review of consultants reports, relevant laboratory and other test results, and medications completed Positive reinforcement provided to patient for making efforts to improve her diabetes and overall health Encouraged patient to continue following a diabetic friendly diet and taking prescribed medications exactly as directed  Lab Results  Component Value Date   HGBA1C 6.5 (H) 07/18/2022       COMPLETED: to improve my back pain        Care Coordination Interventions: Determined patient is under the care of Rheumatology for management of her pain and has received a diagnosis of Myositis (see other related goal)        Our next appointment is by telephone on 09/26/22 at 1:30 PM  Please call the care guide team at [phone] if you need to cancel or  reschedule your appointment.   If you are experiencing a Mental Health or Behavioral Health Crisis or need someone to talk to, please call 1-800-273-TALK (toll free, 24 hour hotline)  Patient verbalizes understanding of instructions and care plan provided today and agrees to view in MyChart. Active MyChart status and patient understanding of how to access instructions and care plan via MyChart confirmed with patient.     Delsa Sale, RN, BSN, CCM Care Management Coordinator [facility] Care Management Direct Phone: [phone]

## 2022-07-27 NOTE — Patient Outreach (Signed)
  Care Coordination   Follow Up Visit Note   07/27/2022 Name: Michelle Hicks MRN: [id number] DOB: 1950/12/20  Michelle Hicks is a 72 y.o. year old female who sees Dorothyann Peng, MD for primary care. I spoke with  Michelle Hicks by phone today.  What matters to the patients health and wellness today?  Patient would like to consider alternative medications for her joint pain. Patient will provide a 24 hr urine as directed by PCP.     Goals Addressed               This Visit's Progress     Patient Stated     I would like to have my abnormal labs evaluated (pt-stated)        Care Coordination Interventions: Evaluation of current treatment plan related to Myositis and patient's adherence to plan as established by provider Determined patient continues to take Plaquenil as directed with no effectiveness Discussed she continues to follow up with Dr. Sharmon Revere for management of her joint pain  Counseled on the importance of reporting any/all new or changed pain symptoms or management strategies to pain management provider Advised patient to report to care team affect of pain on daily activities Reviewed with patient prescribed pharmacological and nonpharmacological pain relief strategies Advised patient to discuss alternative therapies with provider      Other     To continue to work on lowering A1c        Care Coordination Interventions: Review of patient status, including review of consultants reports, relevant laboratory and other test results, and medications completed Positive reinforcement provided to patient for making efforts to improve her diabetes and overall health Encouraged patient to continue following a diabetic friendly diet and taking prescribed medications exactly as directed  Lab Results  Component Value Date   HGBA1C 6.5 (H) 07/18/2022       COMPLETED: to improve my back pain        Care Coordination Interventions: Determined patient is under the care of  Rheumatology for management of her pain and has received a diagnosis of Myositis (see other related goal)    Interventions Today    Flowsheet Row Most Recent Value  Chronic Disease   Chronic disease during today's visit Diabetes, Hypertension (HTN), Other  [Myositis]  General Interventions   General Interventions Discussed/Reviewed General Interventions Discussed, General Interventions Reviewed, Labs, Doctor Visits, Communication with  Doctor Visits Discussed/Reviewed Doctor Visits Discussed, Doctor Visits Reviewed, PCP  Communication with RN  Gerarda Fraction CMA to coordinate specimen container]  Education Interventions   Education Provided Provided Education  Provided Verbal Education On Labs, When to see the doctor, Medication  Labs Reviewed Hgb A1c  Pharmacy Interventions   Pharmacy Dicussed/Reviewed Pharmacy Topics Reviewed, Pharmacy Topics Discussed, Medications and their functions         SDOH assessments and interventions completed:  No     Care Coordination Interventions:  Yes, provided   Follow up plan: Follow up call scheduled for 09/26/22 @1 :30 PM    Encounter Outcome:  Pt. Visit Completed

## 2022-07-31 DIAGNOSIS — R778 Other specified abnormalities of plasma proteins: Secondary | ICD-10-CM | POA: Diagnosis not present

## 2022-08-01 ENCOUNTER — Other Ambulatory Visit: Payer: Medicare Other

## 2022-08-01 ENCOUNTER — Other Ambulatory Visit: Payer: Self-pay

## 2022-08-01 DIAGNOSIS — R778 Other specified abnormalities of plasma proteins: Secondary | ICD-10-CM

## 2022-08-02 LAB — IFE+PROTEIN ELECTRO, 24-HR UR
% BETA, Urine: 0 %
ALBUMIN, U: 100 %
ALPHA 1 URINE: 0 %
ALPHA-2-GLOBULIN, U: 0 %
GAMMA GLOBULIN URINE: 0 %
Protein, 24H Urine: 97 mg/24 hr (ref 30–150)
Protein, Ur: 6.4 mg/dL

## 2022-08-08 ENCOUNTER — Ambulatory Visit
Admission: RE | Admit: 2022-08-08 | Discharge: 2022-08-08 | Disposition: A | Discharge status: Home / Self Care | Payer: Medicare Other | Source: Ambulatory Visit | Attending: Internal Medicine | Admitting: Internal Medicine

## 2022-08-08 ENCOUNTER — Ambulatory Visit: Payer: Medicare Other | Admitting: Orthopaedic Surgery

## 2022-08-08 DIAGNOSIS — I700 Atherosclerosis of aorta: Secondary | ICD-10-CM | POA: Diagnosis not present

## 2022-08-08 DIAGNOSIS — Z87891 Personal history of nicotine dependence: Secondary | ICD-10-CM

## 2022-08-08 DIAGNOSIS — R911 Solitary pulmonary nodule: Secondary | ICD-10-CM

## 2022-08-08 DIAGNOSIS — I311 Chronic constrictive pericarditis: Secondary | ICD-10-CM | POA: Diagnosis not present

## 2022-08-08 DIAGNOSIS — K449 Diaphragmatic hernia without obstruction or gangrene: Secondary | ICD-10-CM | POA: Diagnosis not present

## 2022-08-15 DIAGNOSIS — M609 Myositis, unspecified: Secondary | ICD-10-CM | POA: Diagnosis not present

## 2022-08-15 DIAGNOSIS — Z79899 Other long term (current) drug therapy: Secondary | ICD-10-CM | POA: Diagnosis not present

## 2022-08-15 DIAGNOSIS — R748 Abnormal levels of other serum enzymes: Secondary | ICD-10-CM | POA: Diagnosis not present

## 2022-08-21 ENCOUNTER — Other Ambulatory Visit: Payer: Self-pay | Admitting: Internal Medicine

## 2022-08-21 MED ORDER — GABAPENTIN 100 MG PO CAPS: 100.0000 mg | ORAL_CAPSULE | Freq: Every day | ORAL | 2 refills | Status: AC

## 2022-08-24 DIAGNOSIS — G4733 Obstructive sleep apnea (adult) (pediatric): Secondary | ICD-10-CM | POA: Diagnosis not present

## 2022-09-01 DIAGNOSIS — M25532 Pain in left wrist: Secondary | ICD-10-CM | POA: Diagnosis not present

## 2022-09-12 ENCOUNTER — Telehealth: Payer: Self-pay

## 2022-09-12 NOTE — Telephone Encounter (Signed)
Spoke with patient. PA ready for pick up.

## 2022-09-15 ENCOUNTER — Ambulatory Visit: Payer: Medicare Other | Admitting: Orthopaedic Surgery

## 2022-09-15 ENCOUNTER — Encounter: Payer: Self-pay | Admitting: Orthopaedic Surgery

## 2022-09-15 VITALS — Ht 64.0 in | Wt 240.6 lb

## 2022-09-15 DIAGNOSIS — M48062 Spinal stenosis, lumbar region with neurogenic claudication: Secondary | ICD-10-CM | POA: Diagnosis not present

## 2022-09-15 NOTE — Progress Notes (Signed)
Office Visit Note   Patient: Michelle Hicks           Date of Birth: 09-Sep-1950           MRN: [id number] Visit Date: 09/15/2022              Requested by: Dorothyann Peng, MD [address] [place],  [place] [address] PCP: Dorothyann Peng, MD   Assessment & Plan: Visit Diagnoses:  1. Spinal stenosis of lumbar region with neurogenic claudication     Plan: Patient claudication symptoms.  She has anterolisthesis L4-5 with shifts on flexion extension lateral radiographs.  Will obtain an MRI scan and she can follow-up with Dr. Christell Constant since I will be out of the office.  She may require lumbar fusion and needs to get her BMI below 40.  In addition she is on multiple Biologics for her myositis and some of these would likely have to be held if she did require instrumented fusion at L4-5.  Follow-Up Instructions: No follow-ups on file.   Orders:  No orders of the defined types were placed in this encounter.  No orders of the defined types were placed in this encounter.     Procedures: No procedures performed   Clinical Data: No additional findings.   Subjective: Chief Complaint  Patient presents with   Lower Back - Pain, Follow-up    HPI 72 year old female returns with ongoing chronic back pain.  She has had 2 episodes of falling with leg weakness.  She has more pain on her right leg than left leg.  She is having trouble sleeping she does better leaning on a grocery cart she gets some relief with sitting for 10 to 15 minutes.  She has neurogenic claudication symptoms.  Goal weight is 233 to get her BMI below 40.  She has had flexion-extension lateral lumbar x-rays that show shifting from 6 to 10 mm at the L4-5 level.  She has not had MRI imaging studies of her lumbar spine.  Her symptoms have progressed she is concerned about the falls and she wants to proceed with MRI scan.  Patient's type 2 diabetes on Ozempic she is lost about 10 pounds still needs to lose 5 more.  She  states her A1c is down to 6.5.  Positive for hypertension hyperlipidemia.  Patient states she has diagnosis of myositis and is treated by rheumatologist at [facility] Dr. Sharmon Revere .  Patient takes prednisone and methotrexate, Plaquenil and hydroxychloroquine.  Review of Systems all systems updated unchanged.   Objective: Vital Signs: Ht 5\' 4"  (1.626 m)   Wt 240 lb 9.6 oz (109.1 kg)   BMI 41.30 kg/m   Physical Exam Constitutional:      Appearance: She is well-developed.  HENT:     Head: Normocephalic.     Right Ear: External ear normal.     Left Ear: External ear normal. There is no impacted cerumen.  Eyes:     Pupils: Pupils are equal, round, and reactive to light.  Neck:     Thyroid: No thyromegaly.     Trachea: No tracheal deviation.  Cardiovascular:     Rate and Rhythm: Normal rate.  Pulmonary:     Effort: Pulmonary effort is normal.  Abdominal:     Palpations: Abdomen is soft.  Musculoskeletal:     Cervical back: No rigidity.  Skin:    General: Skin is warm and dry.  Neurological:     Mental Status: She is alert and oriented  to person, place, and time.  Psychiatric:        Behavior: Behavior normal.     Ortho Exam patient ambulates normal heel-toe gait.  Negative straight leg raising 90 degrees no atrophy of the lower extremity.  Specialty Comments:  No specialty comments available.  Imaging: No results found.   PMFS History: Patient Active Problem List   Diagnosis Date Noted   Palpitations 01/19/2022   Dyslipidemia associated with type 2 diabetes mellitus (HCC) 12/08/2021   Spinal stenosis of lumbar region 12/06/2021   Right flank pain 08/30/2021   Acute right-sided low back pain 08/30/2021   Normocytic anemia 08/30/2021   Mixed hyperlipidemia 07/17/2021   Thyroid nodule 07/17/2021   Pulmonary nodule 07/17/2021   Myositis 07/17/2021   Pericardial calcification 07/12/2021   Tachycardia 05/30/2021   Exertional dyspnea 05/30/2021   OSA (obstructive  sleep apnea) 05/26/2021   GERD with apnea 05/26/2021   Attention deficit hyperactivity disorder, predominantly inattentive type 12/06/2020   History of anemia 12/06/2020   Hyperglycemia due to type 2 diabetes mellitus (HCC) 12/06/2020   Hypothyroidism 12/06/2020   Menopausal symptom 12/06/2020   Non-toxic goiter 12/06/2020   Pure hypercholesterolemia 12/06/2020   Positive ANA (antinuclear antibody) 12/06/2020   Pain in left knee 12/06/2020   Chronic insomnia 04/26/2020   Snoring 04/26/2020   Class 3 severe obesity due to excess calories with serious comorbidity and body mass index (BMI) of 40.0 to 44.9 in adult (HCC) 03/18/2019   Essential hypertension, benign 11/13/2017   Gastroesophageal reflux disease without esophagitis 11/13/2017   Hypertension 09/04/2017   Type II diabetes mellitus, uncontrolled 09/04/2017   S/P abdominal hysterectomy and left salpingo-oophorectomy 08/18/1994   Past Medical History:  Diagnosis Date   Depression    DM (diabetes mellitus) (HCC)    Exertional dyspnea 05/30/2021   HTN (hypertension)    Palpitations 01/19/2022   Pericardial calcification 07/12/2021   Tachycardia 05/30/2021    Family History  Problem Relation Age of Onset   Diabetes Mother    Pancreatic cancer Mother    Hypertension Father    Heart attack Father 84   Breast cancer Neg Hx     Past Surgical History:  Procedure Laterality Date   ABDOMINAL HYSTERECTOMY     RIGHT/LEFT HEART CATH AND CORONARY ANGIOGRAPHY N/A 07/22/2021   Procedure: RIGHT/LEFT HEART CATH AND CORONARY ANGIOGRAPHY;  Surgeon: Tonny Bollman, MD;  Location: [facility] INVASIVE CV LAB;  Service: Cardiovascular;  Laterality: N/A;   TONSILLECTOMY Bilateral 1967   Social History   Occupational History   Occupation: retired  Tobacco Use   Smoking status: Former    Current packs/day: 0.00    Average packs/day: 0.8 packs/day for 20.0 years (15.0 ttl pk-yrs)    Types: Cigarettes    Start date: 82    Quit date: 2002     Years since quitting: 22.6    Passive exposure: Past   Smokeless tobacco: Never  Vaping Use   Vaping status: Never Used  Substance and Sexual Activity   Alcohol use: Yes    Comment: socially    Drug use: No   Sexual activity: Not Currently    Partners: Male    Birth control/protection: Surgical

## 2022-09-15 NOTE — Addendum Note (Signed)
Addended by: Rogers Seeds on: 09/15/2022 08:55 AM   Modules accepted: Orders

## 2022-09-26 ENCOUNTER — Ambulatory Visit: Payer: Self-pay

## 2022-09-26 NOTE — Patient Outreach (Signed)
  Care Coordination   09/26/2022 Name: Michelle Hicks MRN: [id number] DOB: 03-03-1950   Care Coordination Outreach Attempts:  An unsuccessful telephone outreach was attempted for a scheduled appointment today.  Follow Up Plan:  Additional outreach attempts will be made to offer the patient care coordination information and services.   Encounter Outcome:  No Answer   Care Coordination Interventions:  No, not indicated    Delsa Sale, RN, BSN, CCM Care Management Coordinator [facility] Care Management Direct Phone: [phone]

## 2022-10-10 ENCOUNTER — Ambulatory Visit: Payer: Medicare Other | Admitting: Orthopaedic Surgery

## 2022-10-11 ENCOUNTER — Telehealth: Payer: Self-pay | Admitting: *Deleted

## 2022-10-11 NOTE — Progress Notes (Signed)
  Care Coordination Note  10/11/2022 Name: Michelle Hicks MRN: [id number] DOB: November 10, 1950  Michelle Hicks is a 72 y.o. year old female who is a primary care patient of Dorothyann Peng, MD and is actively engaged with the care management team. I reached out to Michelle Hicks by phone today to assist with re-scheduling a follow up visit with the RN Case Manager  Follow up plan: Unsuccessful telephone outreach attempt made. A HIPAA compliant phone message was left for the patient providing contact information and requesting a return call.   [facility]  Care Coordination Care Guide  Direct Dial: [phone]

## 2022-10-12 ENCOUNTER — Ambulatory Visit: Payer: Medicare Other | Admitting: Orthopedic Surgery

## 2022-10-17 ENCOUNTER — Ambulatory Visit
Admission: RE | Admit: 2022-10-17 | Discharge: 2022-10-17 | Disposition: A | Discharge status: Home / Self Care | Payer: Medicare Other | Source: Ambulatory Visit | Attending: Orthopaedic Surgery | Admitting: Orthopaedic Surgery

## 2022-10-17 DIAGNOSIS — M48062 Spinal stenosis, lumbar region with neurogenic claudication: Secondary | ICD-10-CM

## 2022-10-17 DIAGNOSIS — M47816 Spondylosis without myelopathy or radiculopathy, lumbar region: Secondary | ICD-10-CM | POA: Diagnosis not present

## 2022-10-18 NOTE — Progress Notes (Signed)
  Care Coordination Note  10/18/2022 Name: [name] MRN: [id number] DOB: [birth year]  Michelle Hicks is a 72 y.o. year old female who is a primary care patient of Dorothyann Peng, MD and is actively engaged with the care management team. I reached out to Michelle Hicks by phone today to assist with re-scheduling a follow up visit with the RN Case Manager  Follow up plan: Telephone appointment with care management team member scheduled for:9/16  [facility] Coordination Care Guide  Direct Dial: [phone]

## 2022-10-19 ENCOUNTER — Encounter: Payer: Self-pay | Admitting: Orthopedic Surgery

## 2022-10-19 ENCOUNTER — Other Ambulatory Visit (INDEPENDENT_AMBULATORY_CARE_PROVIDER_SITE_OTHER): Payer: Medicare Other

## 2022-10-19 ENCOUNTER — Ambulatory Visit: Payer: Medicare Other | Admitting: Orthopedic Surgery

## 2022-10-19 VITALS — BP 110/74 | HR 83 | Ht 64.0 in | Wt 241.0 lb

## 2022-10-19 DIAGNOSIS — M48062 Spinal stenosis, lumbar region with neurogenic claudication: Secondary | ICD-10-CM | POA: Diagnosis not present

## 2022-10-19 MED ORDER — METHYLPREDNISOLONE 4 MG PO TBPK: ORAL_TABLET | ORAL | 0 refills | Status: DC

## 2022-10-19 MED ORDER — CYCLOBENZAPRINE HCL 10 MG PO TABS: 10.0000 mg | ORAL_TABLET | Freq: Three times a day (TID) | ORAL | 0 refills | Status: AC | PRN

## 2022-10-19 NOTE — Progress Notes (Addendum)
Orthopedic Spine Surgery Office Note  Assessment: Patient is a 72 y.o. female with low back pain that radiates into the right lower extremity, consistent with radiculopathy.  Patient has stenosis at L3/4 and L4/5   Plan: -Explained that initially conservative treatment is tried as a significant number of patients may experience relief with these treatment modalities. Discussed that the conservative treatments include:  -activity modification  -physical therapy  -over the counter pain medications  -medrol dosepak  -lumbar steroid injections -Patient has tried Tylenol, Advil, PT, tizanidine -Prescribed Medrol Dosepak and Flexeril (since she had side effects with tizanidine).  I also recommended and referred her for a diagnostic/therapeutic injection at L4/5 -Explained that weight loss would likely help with her back pain and with her overall health -Patient should return to office in 6 weeks, x-rays at next visit: none   Patient expressed understanding of the plan and all questions were answered to the patient's satisfaction.   ___________________________________________________________________________   History:  Patient is a 72 y.o. female who presents today for lumbar spine.  Patient has had 1 year of low back pain that radiates into the right lower extremity.  She feels it goes into the lateral and anterior aspects of the right thigh.  She says pain is gotten progressively worse with time.  Initially, she could take a Tylenol and it would give her several days of relief.  Now, she is taking the maximum doses of extra strength Tylenol and is not getting significant relief.  She has also started taking Advil to help with the pain.  She had tried tizanidine in the past and noted that it was somewhat helpful.  There was no trauma or injury that preceded the onset of pain.  Patient has the pain with activity and rest.   Weakness: yes, feels weaker when going up steps on both legs. No other  weakness noted Symptoms of imbalance: Denies Paresthesias and numbness: Denies Bowel or bladder incontinence: Denies Saddle anesthesia: Denies  Treatments tried: tylenol, advil, PT, tizanidine  Review of systems: Denies fevers and chills, night sweats, unexplained weight loss, history of cancer.  Has had pain that wakes her at night  Past medical history: HTN HLD GERD Diabetes (last A1C was 6.5 on 07/18/2022)  Allergies: statins, sulfa  Past surgical history:  Hysterectomy Tonsillectomy  Social history: Denies use of nicotine product (smoking, vaping, patches, smokeless) Alcohol use: Yes, about 1 drink per week Denies recreational drug use   Physical Exam:  BMI of 41.4  General: no acute distress, appears stated age Neurologic: alert, answering questions appropriately, following commands Respiratory: unlabored breathing on room air, symmetric chest rise Psychiatric: appropriate affect, normal cadence to speech   MSK (spine):  -Strength exam      Left  Right EHL    5/5  5/5 TA    5/5  5/5 GSC    5/5  5/5 Knee extension  5/5  5/5 Hip flexion   5/5  5/5  -Sensory exam    Sensation intact to light touch in L3-S1 nerve distributions of bilateral lower extremities  -Achilles DTR: 1/4 on the left, 1/4 on the right -Patellar tendon DTR: 1/4 on the left, 1/4 on the right  -Straight leg raise: Negative bilaterally -Femoral nerve stretch test: Negative bilaterally -Clonus: no beats bilaterally  -Left hip exam: No pain through range of motion, negative Stinchfield, negative FABER -Right hip exam: No pain through range of motion, negative Stinchfield, negative FABER  Imaging: XR of the lumbar spine from  10/19/2022 was independently reviewed and interpreted, showing disc height loss at L3/4 and L4/5.  There is a spondylolisthesis seen at L4/5 that shifts 3.5 mm between flexion and extension views.  No fracture or dislocation seen.  MRI of the lumbar spine from  10/17/2022 was independently reviewed and interpreted, showing central stenosis at L3/4.  Central and lateral recess stenosis at L4/5.  Bilateral foraminal stenosis at L4/5.   Patient name: Michelle Hicks Patient MRN: [id number] Date of visit: 10/19/22

## 2022-10-20 ENCOUNTER — Ambulatory Visit: Payer: Medicare Other | Admitting: Orthopedic Surgery

## 2022-10-27 ENCOUNTER — Telehealth: Payer: Self-pay | Admitting: Physical Medicine and Rehabilitation

## 2022-10-27 NOTE — Telephone Encounter (Signed)
Patient returned call asked for a call back to schedule an appointment for her back.  Patient advised morning 11 and after.   The number to contact patient is [phone]

## 2022-10-30 ENCOUNTER — Ambulatory Visit: Payer: Self-pay

## 2022-10-30 NOTE — Patient Instructions (Signed)
Visit Information  Thank you for taking time to visit with me today. Please don't hesitate to contact me if I can be of assistance to you.   Following are the goals we discussed today:   Goals Addressed             This Visit's Progress    To get relief from low back pain   On track    Care Coordination Interventions: Reviewed provider established plan for pain management Discussed importance of adherence to all scheduled medical appointments Counseled on the importance of reporting any/all new or changed pain symptoms or management strategies to pain management provider Advised patient to report to care team affect of pain on daily activities Reviewed with patient prescribed pharmacological and nonpharmacological pain relief strategies Advised patient to discuss outpatient PT  with provider Reviewed upcoming schedules f/u with Orthopedics, Dr. Willia Craze set for 11/30/22 @10 :30 AM         Our next appointment is by telephone on 12/13/22 at 1:00 PM  Please call the care guide team at [phone] if you need to cancel or reschedule your appointment.   If you are experiencing a Mental Health or Behavioral Health Crisis or need someone to talk to, please call 1-800-273-TALK (toll free, 24 hour hotline)  Patient verbalizes understanding of instructions and care plan provided today and agrees to view in MyChart. Active MyChart status and patient understanding of how to access instructions and care plan via MyChart confirmed with patient.     Delsa Sale RN BSN CCM [facility] Health Nurse Care Coordinator  Direct Dial: [phone] Website: [email or web address]

## 2022-10-30 NOTE — Patient Outreach (Signed)
Care Coordination   Follow Up Visit Note   10/30/2022 Name: Michelle Hicks MRN: [id number] DOB: 02-22-1950  Michelle Hicks is a 72 y.o. year old female who sees Dorothyann Peng, MD for primary care. I spoke with  Michelle Hicks by phone today.  What matters to the patients health and wellness today?  Patient would like to get relief from pain to her lumbar spine following a steroid injection. She may consider participating in outpatient PT for long term management of her back pain.     Goals Addressed             This Visit's Progress    To get relief from low back pain   On track    Care Coordination Interventions: Reviewed provider established plan for pain management Discussed importance of adherence to all scheduled medical appointments Counseled on the importance of reporting any/all new or changed pain symptoms or management strategies to pain management provider Advised patient to report to care team affect of pain on daily activities Reviewed with patient prescribed pharmacological and nonpharmacological pain relief strategies Advised patient to discuss outpatient PT  with provider Reviewed upcoming schedules f/u with Orthopedics, Dr. Willia Craze set for 11/30/22 @10 :30 AM    Interventions Today    Flowsheet Row Most Recent Value  Chronic Disease   Chronic disease during today's visit Other  [low back pain]  General Interventions   General Interventions Discussed/Reviewed General Interventions Discussed, General Interventions Reviewed, Doctor Visits, Labs  Doctor Visits Discussed/Reviewed Doctor Visits Discussed, Doctor Visits Reviewed, Specialist  Exercise Interventions   Exercise Discussed/Reviewed Physical Activity, Exercise Reviewed, Exercise Discussed  Physical Activity Discussed/Reviewed Physical Activity Discussed, Physical Activity Reviewed, Types of exercise  Education Interventions   Education Provided Provided Education  Provided Verbal Education On  When to see the doctor, Medication, Exercise, Labs  Pharmacy Interventions   Pharmacy Dicussed/Reviewed Pharmacy Topics Discussed, Pharmacy Topics Reviewed, Medications and their functions  Safety Interventions   Safety Discussed/Reviewed Fall Risk, Home Safety, Safety Reviewed, Safety Discussed  Home Safety Assistive Devices          SDOH assessments and interventions completed:  No     Care Coordination Interventions:  Yes, provided   Follow up plan: Follow up call scheduled for 12/13/22 @1 :00 PM     Encounter Outcome:  Patient Visit Completed

## 2022-10-31 ENCOUNTER — Other Ambulatory Visit: Payer: Self-pay | Admitting: Physical Medicine and Rehabilitation

## 2022-10-31 ENCOUNTER — Telehealth: Payer: Self-pay

## 2022-10-31 MED ORDER — DIAZEPAM 5 MG PO TABS: ORAL_TABLET | ORAL | 0 refills | Status: DC

## 2022-10-31 NOTE — Telephone Encounter (Signed)
Patient is scheduled for injection on 11/21/22. Needs pre procedure Valium sent to [facility] on [address]

## 2022-10-31 NOTE — Telephone Encounter (Signed)
Spoke with patient and scheduled injection for 11/21/22. Patient aware driver needed

## 2022-11-20 DIAGNOSIS — M609 Myositis, unspecified: Secondary | ICD-10-CM | POA: Diagnosis not present

## 2022-11-20 DIAGNOSIS — Z23 Encounter for immunization: Secondary | ICD-10-CM | POA: Diagnosis not present

## 2022-11-20 DIAGNOSIS — Z79899 Other long term (current) drug therapy: Secondary | ICD-10-CM | POA: Diagnosis not present

## 2022-11-21 ENCOUNTER — Ambulatory Visit: Payer: Medicare Other | Admitting: Physical Medicine and Rehabilitation

## 2022-11-21 ENCOUNTER — Encounter: Payer: Self-pay | Admitting: Orthopedic Surgery

## 2022-11-21 ENCOUNTER — Other Ambulatory Visit: Payer: Self-pay

## 2022-11-21 VITALS — BP 143/79 | HR 102

## 2022-11-21 DIAGNOSIS — M5416 Radiculopathy, lumbar region: Secondary | ICD-10-CM | POA: Diagnosis not present

## 2022-11-21 MED ORDER — METHYLPREDNISOLONE ACETATE 40 MG/ML IJ SUSP
40.0000 mg | Freq: Once | INTRAMUSCULAR | Status: AC
Start: 2022-11-21 — End: 2022-11-21
  Administered 2022-11-21: 40 mg

## 2022-11-21 NOTE — Progress Notes (Signed)
Functional Pain Scale - descriptive words and definitions  Moderate (4)   Constantly aware of pain, can complete ADLs with modification/sleep marginally affected at times/passive distraction is of no use, but active distraction gives some relief. Moderate range order  Average Pain 3-4   +Driver, -BT, -Dye Allergies.  Lower back pain on right side with tightness in the back of the right leg. Has pain on both sides when sitting

## 2022-11-21 NOTE — Patient Instructions (Signed)

## 2022-11-29 ENCOUNTER — Ambulatory Visit: Payer: Medicare Other | Admitting: Internal Medicine

## 2022-11-29 ENCOUNTER — Encounter: Payer: Self-pay | Admitting: Internal Medicine

## 2022-11-29 VITALS — BP 118/68 | HR 90 | Temp 98.4°F | Ht 64.0 in | Wt 242.8 lb

## 2022-11-29 DIAGNOSIS — G720 Drug-induced myopathy: Secondary | ICD-10-CM | POA: Diagnosis not present

## 2022-11-29 DIAGNOSIS — Z6841 Body Mass Index (BMI) 40.0 and over, adult: Secondary | ICD-10-CM

## 2022-11-29 DIAGNOSIS — R778 Other specified abnormalities of plasma proteins: Secondary | ICD-10-CM | POA: Diagnosis not present

## 2022-11-29 DIAGNOSIS — I700 Atherosclerosis of aorta: Secondary | ICD-10-CM | POA: Diagnosis not present

## 2022-11-29 DIAGNOSIS — G4733 Obstructive sleep apnea (adult) (pediatric): Secondary | ICD-10-CM | POA: Diagnosis not present

## 2022-11-29 DIAGNOSIS — T466X5A Adverse effect of antihyperlipidemic and antiarteriosclerotic drugs, initial encounter: Secondary | ICD-10-CM | POA: Diagnosis not present

## 2022-11-29 DIAGNOSIS — E1169 Type 2 diabetes mellitus with other specified complication: Secondary | ICD-10-CM | POA: Diagnosis not present

## 2022-11-29 DIAGNOSIS — E785 Hyperlipidemia, unspecified: Secondary | ICD-10-CM | POA: Diagnosis not present

## 2022-11-29 DIAGNOSIS — I119 Hypertensive heart disease without heart failure: Secondary | ICD-10-CM | POA: Diagnosis not present

## 2022-11-29 DIAGNOSIS — E66813 Obesity, class 3: Secondary | ICD-10-CM

## 2022-11-29 DIAGNOSIS — E039 Hypothyroidism, unspecified: Secondary | ICD-10-CM | POA: Diagnosis not present

## 2022-11-29 DIAGNOSIS — Z87891 Personal history of nicotine dependence: Secondary | ICD-10-CM

## 2022-11-29 NOTE — Progress Notes (Signed)
I,Victoria T Deloria Lair, CMA,acting as a Neurosurgeon for Gwynneth Aliment, MD.,have documented all relevant documentation on the behalf of Gwynneth Aliment, MD,as directed by  Gwynneth Aliment, MD while in the presence of Gwynneth Aliment, MD.  Subjective:  Patient ID: Michelle Hicks , female    DOB: 1950-06-22 , 72 y.o.   MRN: [id number]  Chief Complaint  Patient presents with   Diabetes   Hypertension   Hypothyroidism    HPI  The patient is here today for a follow-up on her diabetes, blood pressure & thyroid.  She reports compliance with meds.  She denies headaches, chest pain and shortness of breath. She reports no specific questions or concerns.         Diabetes She presents for her follow-up diabetic visit. She has type 2 diabetes mellitus. Her disease course has been stable. There are no hypoglycemic associated symptoms. There are no diabetic associated symptoms. Pertinent negatives for diabetes include no blurred vision, no polydipsia, no polyphagia and no polyuria. There are no hypoglycemic complications. Symptoms are stable. Risk factors for coronary artery disease include diabetes mellitus, obesity, sedentary lifestyle and post-menopausal. She is compliant with treatment most of the time. She is following a generally healthy diet. She participates in exercise intermittently. An ACE inhibitor/angiotensin II receptor blocker is being taken. Eye exam is current.  Hypertension This is a chronic problem. The current episode started more than 1 year ago. The problem has been gradually improving since onset. The problem is controlled. Pertinent negatives include no blurred vision or shortness of breath. The current treatment provides moderate improvement.  Hyperlipidemia This is a chronic problem. The current episode started more than 1 year ago. Exacerbating diseases include obesity. Pertinent negatives include no shortness of breath. Current antihyperlipidemic treatment includes exercise and  diet change. The current treatment provides moderate improvement of lipids. Compliance problems include adherence to exercise.  Risk factors for coronary artery disease include diabetes mellitus, dyslipidemia, hypertension, obesity and post-menopausal.     Past Medical History:  Diagnosis Date   Depression    DM (diabetes mellitus) (HCC)    Exertional dyspnea 05/30/2021   HTN (hypertension)    Palpitations 01/19/2022   Pericardial calcification 07/12/2021   Tachycardia 05/30/2021     Family History  Problem Relation Age of Onset   Diabetes Mother    Pancreatic cancer Mother    Hypertension Father    Heart attack Father 21   Breast cancer Neg Hx      Current Outpatient Medications:    Accu-Chek Softclix Lancets lancets, Use as instructed to check blood sugars twice daily E11.69, Disp: 180 each, Rfl: 5   amLODipine (NORVASC) 5 MG tablet, TAKE 1 TABLET(5 MG) BY MOUTH DAILY, Disp: 90 tablet, Rfl: 3   Blood Glucose Monitoring Suppl (ACCU-CHEK GUIDE) w/Device KIT, Use to check blood sugars twice daily E11.69, Disp: 1 kit, Rfl: 1   cyclobenzaprine (FLEXERIL) 10 MG tablet, Take 1 tablet (10 mg total) by mouth 3 (three) times daily as needed for muscle spasms., Disp: 30 tablet, Rfl: 0   Evolocumab (REPATHA SURECLICK) 140 MG/ML SOAJ, Inject 140 mg into the skin every 14 (fourteen) days., Disp: 6 mL, Rfl: 3   fluticasone (FLONASE) 50 MCG/ACT nasal spray, Place 1 spray into both nostrils daily., Disp: , Rfl:    folic acid (FOLVITE) 1 MG tablet, Take 1 mg by mouth daily., Disp: , Rfl:    gabapentin (NEURONTIN) 100 MG capsule, Take 1 capsule (100 mg total) by  mouth daily., Disp: 30 capsule, Rfl: 2   glucose blood (ACCU-CHEK GUIDE) test strip, USE TO CHECK BLOOD SUGAR TWICE DAILY AS DIRECTED, Disp: 100 strip, Rfl: 3   hydrochlorothiazide (HYDRODIURIL) 25 MG tablet, TAKE 1 TABLET BY MOUTH DAILY, Disp: 100 tablet, Rfl: 2   hydroxychloroquine (PLAQUENIL) 200 MG tablet, Take 200 mg by mouth daily.,  Disp: , Rfl:    levothyroxine (SYNTHROID) 50 MCG tablet, Take 50 mcg by mouth daily. , Disp: , Rfl:    methotrexate (RHEUMATREX) 2.5 MG tablet, Take 10 mg by mouth once a week., Disp: , Rfl:    predniSONE (DELTASONE) 5 MG tablet, Take 5 mg by mouth daily., Disp: , Rfl:    Semaglutide, 1 MG/DOSE, (OZEMPIC, 1 MG/DOSE,) 2 MG/1.5ML SOPN, Inject 1 mg into the skin once a week., Disp: 4.5 mL, Rfl: 3   telmisartan (MICARDIS) 80 MG tablet, TAKE 1 TABLET BY MOUTH DAILY, Disp: 100 tablet, Rfl: 2   traZODone (DESYREL) 50 MG tablet, TAKE 1 TABLET(50 MG) BY MOUTH AT BEDTIME AS NEEDED FOR SLEEP, Disp: 90 tablet, Rfl: 1   Vitamin D, Cholecalciferol, 25 MCG (1000 UT) CAPS, Take by mouth., Disp: , Rfl:    diazepam (VALIUM) 5 MG tablet, Take one tablet by mouth with food one hour prior to procedure. May repeat 30 minutes prior if needed. (Patient not taking: Reported on 11/29/2022), Disp: 2 tablet, Rfl: 0   Multiple Vitamin (MULTIVITAMIN) tablet, Take 1 tablet by mouth daily. (Patient not taking: Reported on 11/29/2022), Disp: , Rfl:   Current Facility-Administered Medications:    sodium chloride flush (NS) 0.9 % injection 3 mL, 3 mL, Intravenous, Q12H, Chilton Si, MD   Allergies  Allergen Reactions   Statins     CK elevation   Sulfa Antibiotics Rash   Sulfamethoxazole-Trimethoprim Rash     Review of Systems  Constitutional: Negative.   Eyes:  Negative for blurred vision.  Respiratory: Negative.  Negative for shortness of breath.   Cardiovascular: Negative.   Gastrointestinal: Negative.   Endocrine: Negative for polydipsia, polyphagia and polyuria.  Skin: Negative.   Neurological: Negative.   Psychiatric/Behavioral: Negative.       Today's Vitals   11/29/22 0916  BP: 118/68  Pulse: 90  Temp: 98.4 F (36.9 C)  SpO2: 98%  Weight: 242 lb 12.8 oz (110.1 kg)  Height: 5\' 4"  (1.626 m)   Body mass index is 41.68 kg/m.  Wt Readings from Last 3 Encounters:  11/29/22 242 lb 12.8 oz (110.1  kg)  10/19/22 241 lb (109.3 kg)  09/15/22 240 lb 9.6 oz (109.1 kg)     Objective:  Physical Exam Vitals and nursing note reviewed.  Constitutional:      Appearance: Normal appearance. She is obese.  HENT:     Head: Normocephalic and atraumatic.  Eyes:     Extraocular Movements: Extraocular movements intact.  Cardiovascular:     Rate and Rhythm: Normal rate and regular rhythm.     Heart sounds: Normal heart sounds.  Pulmonary:     Effort: Pulmonary effort is normal.     Breath sounds: Normal breath sounds.  Musculoskeletal:     Cervical back: Normal range of motion.     Right lower leg: No edema.     Left lower leg: No edema.  Skin:    General: Skin is warm.  Neurological:     General: No focal deficit present.     Mental Status: She is alert.  Psychiatric:  Mood and Affect: Mood normal.        Behavior: Behavior normal.         Assessment And Plan:  Dyslipidemia associated with type 2 diabetes mellitus (HCC) Assessment & Plan: Chronic, LDL goal is less than 70 - currently on Repatha due to statin myopathy. Regarding DM, she will continue with Ozempic 1mg  weekly, will consider dose increase for improved weight control. I will check labs as below, f/u in 3-4 months.   Orders: -     Hemoglobin A1c -     BMP8+EGFR  Hypertensive heart disease without heart failure Assessment & Plan: Chronic, well controlled.  She will continue amlodipine, HCTZ, and telmisartan.  She is reminded to follow a low sodium diet.   Aortic atherosclerosis (HCC) Assessment & Plan: Chronic, not on statin therapy due to statin myopathy. LDL goal is less than 70.  She will continue with Repatha.    Primary hypothyroidism Assessment & Plan: Chronic, currently on levothyroxine daily. Encouraged to take upon awakening and wait 3-4 hours before taking vitamins. I will check thyroid panel and adjust meds as needed.   Orders: -     TSH + free T4  Abnormal serum protein  electrophoresis Assessment & Plan: ???MGUS?? She has been referred to [facility] by her rheumatologist. I had previously referred to [facility], referral was declined and rheumatology eval was suggested.    OSA (obstructive sleep apnea) Assessment & Plan: Chronic, she reports compliance with wearing CPAP at least four hours nightly and has noticed benefit from its use.    Class 3 severe obesity due to excess calories with serious comorbidity and body mass index (BMI) of 40.0 to 44.9 in adult [facility]) Assessment & Plan: BMI 41. She is encouraged to aim for at least 150 minutes of exercise per week, while striving to lose ten percent of her body weight to decrease cardiac risk.    Statin myopathy  She is encouraged to strive for BMI less than 30 to decrease cardiac risk. Advised to aim for at least 150 minutes of exercise per week.    Return in 4 months (on 04/01/2023), or dm check, for cancel March appt (RS only), cancel 6/4 appt.  Patient was given opportunity to ask questions. Patient verbalized understanding of the plan and was able to repeat key elements of the plan. All questions were answered to their satisfaction.    I, Gwynneth Aliment, MD, have reviewed all documentation for this visit. The documentation on 11/29/22 for the exam, diagnosis, procedures, and orders are all accurate and complete.   IF YOU HAVE BEEN REFERRED TO A SPECIALIST, IT MAY TAKE 1-2 WEEKS TO SCHEDULE/PROCESS THE REFERRAL. IF YOU HAVE NOT HEARD FROM US/SPECIALIST IN TWO WEEKS, PLEASE GIVE [place] A CALL AT [phone].   THE PATIENT IS ENCOURAGED TO PRACTICE SOCIAL DISTANCING DUE TO THE COVID-19 PANDEMIC.

## 2022-11-29 NOTE — Patient Instructions (Signed)

## 2022-11-30 ENCOUNTER — Ambulatory Visit: Payer: Medicare Other | Admitting: Orthopedic Surgery

## 2022-11-30 DIAGNOSIS — M5416 Radiculopathy, lumbar region: Secondary | ICD-10-CM

## 2022-11-30 LAB — BMP8+EGFR
BUN/Creatinine Ratio: 19 (ref 12–28)
BUN: 15 mg/dL (ref 8–27)
CO2: 23 mmol/L (ref 20–29)
Calcium: 10 mg/dL (ref 8.7–10.3)
Chloride: 102 mmol/L (ref 96–106)
Creatinine, Ser: 0.79 mg/dL (ref 0.57–1.00)
Glucose: 93 mg/dL (ref 70–99)
Potassium: 4.4 mmol/L (ref 3.5–5.2)
Sodium: 139 mmol/L (ref 134–144)
eGFR: 80 mL/min/{1.73_m2} (ref >59)

## 2022-11-30 LAB — HEMOGLOBIN A1C
Est. average glucose Bld gHb Est-mCnc: 148 mg/dL
Hgb A1c MFr Bld: 6.8 % — ABNORMAL HIGH (ref 4.8–5.6)

## 2022-11-30 LAB — TSH+FREE T4
Free T4: 1.26 ng/dL (ref 0.82–1.77)
TSH: 2.14 u[IU]/mL (ref 0.450–4.500)

## 2022-11-30 NOTE — Progress Notes (Signed)
Orthopedic Spine Surgery Office Note   Assessment: Patient is a 72 y.o. female with low back pain that radiates into the right lower extremity, consistent with radiculopathy.  Patient has stenosis at L3/4 and L4/5     Plan: -Patient has tried Tylenol, Advil, PT, tizanidine, lumbar steroid injection  -Since patient is currently tolerable, did not recommend any further intervention at this time -Patient should continue to work on weight loss would likely help with her back pain and with her overall health -Patient should return to office in 6 weeks, x-rays at next visit: none     Patient expressed understanding of the plan and all questions were answered to the patient's satisfaction.    ___________________________________________________________________________     History:   Patient is a 72 y.o. female who presents today for follow-up on her lumbar spine.  To recap, patient has had 1 year of low back pain that radiates into the right lower extremity along the lateral and anterior aspects of the thigh.  After last visit, she got an injection with Dr. Alvester Morin.  She said she got about 70% relief with that injection.  Pain is now currently tolerable.  She says she is taking less Tylenol as pain is more manageable.  She has not developed any new symptoms since last time she was seen.  Treatments tried: tylenol, advil, PT, tizanidine, lumbar steroid injection     Physical Exam:   General: no acute distress, appears stated age Neurologic: alert, answering questions appropriately, following commands Respiratory: unlabored breathing on room air, symmetric chest rise Psychiatric: appropriate affect, normal cadence to speech     MSK (spine):   -Strength exam                                                   Left                  Right EHL                              5/5                  5/5 TA                                 5/5                  5/5 GSC                             5/5                   5/5 Knee extension            5/5                  5/5 Hip flexion                    5/5                  5/5   -Sensory exam  Sensation intact to light touch in L3-S1 nerve distributions of bilateral lower extremities   -Left hip exam: No pain through range of motion, negative Stinchfield, negative FABER -Right hip exam: No pain through range of motion, negative Stinchfield, negative FABER   Imaging: XRs of the lumbar spine from 10/19/2022 were previously independently reviewed and interpreted, showing disc height loss at L3/4 and L4/5.  There is a spondylolisthesis seen at L4/5 that shifts 3.5 mm between flexion and extension views.  No fracture or dislocation seen.   MRI of the lumbar spine from 10/17/2022 was previously independently reviewed and interpreted, showing central stenosis at L3/4.  Central and lateral recess stenosis at L4/5.  Bilateral foraminal stenosis at L4/5.     Patient name: Michelle Hicks Patient MRN: [id number] Date of visit: 11/30/22

## 2022-12-02 DIAGNOSIS — T466X5A Adverse effect of antihyperlipidemic and antiarteriosclerotic drugs, initial encounter: Secondary | ICD-10-CM | POA: Insufficient documentation

## 2022-12-02 NOTE — Assessment & Plan Note (Signed)
Chronic, well controlled.  She will continue amlodipine, HCTZ, and telmisartan.  She is reminded to follow a low sodium diet.

## 2022-12-02 NOTE — Assessment & Plan Note (Signed)
Chronic, LDL goal is less than 70 - currently on Repatha due to statin myopathy. Regarding DM, she will continue with Ozempic 1mg  weekly, will consider dose increase for improved weight control. I will check labs as below, f/u in 3-4 months.

## 2022-12-02 NOTE — Assessment & Plan Note (Signed)
Chronic, not on statin therapy due to statin myopathy. LDL goal is less than 70.  She will continue with Repatha.

## 2022-12-02 NOTE — Assessment & Plan Note (Signed)
Chronic, she reports compliance with wearing CPAP at least four hours nightly and has noticed benefit from its use.

## 2022-12-02 NOTE — Assessment & Plan Note (Signed)
BMI 41. She is encouraged to aim for at least 150 minutes of exercise per week, while striving to lose ten percent of her body weight to decrease cardiac risk.

## 2022-12-02 NOTE — Assessment & Plan Note (Addendum)
???  MGUS?? She has been referred to [facility] by her rheumatologist. I had previously referred to [facility], referral was declined and rheumatology eval was suggested.

## 2022-12-02 NOTE — Assessment & Plan Note (Signed)
Chronic, currently on levothyroxine daily. Encouraged to take upon awakening and wait 3-4 hours before taking vitamins. I will check thyroid panel and adjust meds as needed.

## 2022-12-04 NOTE — Progress Notes (Signed)
Michelle Hicks - 72 y.o. female MRN [id number]  Date of birth: 1950-10-20  Office Visit Note: Visit Date: 11/21/2022 PCP: Dorothyann Peng, MD Referred by: London Sheer, MD  Subjective: Chief Complaint  Patient presents with   Lower Back - Pain   HPI:  Michelle Hicks is a 72 y.o. female who comes in today at the request of Dr. Willia Craze for planned Right L4-5 Lumbar Transforaminal epidural steroid injection with fluoroscopic guidance.  The patient has failed conservative care including home exercise, medications, time and activity modification.  This injection will be diagnostic and hopefully therapeutic.  Please see requesting physician notes for further details and justification.   ROS Otherwise per HPI.  Assessment & Plan: Visit Diagnoses:    ICD-10-CM   1. Lumbar radiculopathy  M54.16 XR C-ARM NO REPORT    Epidural Steroid injection    methylPREDNISolone acetate (DEPO-MEDROL) injection 40 mg      Plan: No additional findings.   Meds & Orders:  Meds ordered this encounter  Medications   methylPREDNISolone acetate (DEPO-MEDROL) injection 40 mg    Orders Placed This Encounter  Procedures   XR C-ARM NO REPORT   Epidural Steroid injection    Follow-up: Return for visit to requesting provider as needed.   Procedures: No procedures performed  Lumbosacral Transforaminal Epidural Steroid Injection - Sub-Pedicular Approach with Fluoroscopic Guidance  Patient: Michelle Hicks      Date of Birth: [birth year] MRN: [id number] PCP: Dorothyann Peng, MD      Visit Date: 11/21/2022   Universal Protocol:    Date/Time: 11/21/2022  Consent Given By: the patient  Position: PRONE  Additional Comments: Vital signs were monitored before and after the procedure. Patient was prepped and draped in the usual sterile fashion. The correct patient, procedure, and site was verified.   Injection Procedure Details:   Procedure diagnoses: Lumbar radiculopathy [M54.16]     Meds Administered:  Meds ordered this encounter  Medications   methylPREDNISolone acetate (DEPO-MEDROL) injection 40 mg    Laterality: Right  Location/Site: L4  Needle:5.0 in., 22 ga.  Short bevel or Quincke spinal needle  Needle Placement: Transforaminal  Findings:    -Comments: Excellent flow of contrast along the nerve, nerve root and into the epidural space.  Procedure Details: After squaring off the end-plates to get a true AP view, the C-arm was positioned so that an oblique view of the foramen as noted above was visualized. The target area is just inferior to the "nose of the scotty dog" or sub pedicular. The soft tissues overlying this structure were infiltrated with 2-3 ml. of 1% Lidocaine without Epinephrine.  The spinal needle was inserted toward the target using a "trajectory" view along the fluoroscope beam.  Under AP and lateral visualization, the needle was advanced so it did not puncture dura and was located close the 6 O'Clock position of the pedical in AP tracterory. Biplanar projections were used to confirm position. Aspiration was confirmed to be negative for CSF and/or blood. A 1-2 ml. volume of Isovue-250 was injected and flow of contrast was noted at each level. Radiographs were obtained for documentation purposes.   After attaining the desired flow of contrast documented above, a 0.5 to 1.0 ml test dose of 0.25% Marcaine was injected into each respective transforaminal space.  The patient was observed for 90 seconds post injection.  After no sensory deficits were reported, and normal lower extremity motor function was noted,   the above injectate was administered  so that equal amounts of the injectate were placed at each foramen (level) into the transforaminal epidural space.   Additional Comments:  No complications occurred Dressing: 2 x 2 sterile gauze and Band-Aid    Post-procedure details: Patient was observed during the procedure. Post-procedure  instructions were reviewed.  Patient left the clinic in stable condition.    Clinical History: MRI LUMBAR SPINE WITHOUT CONTRAST   TECHNIQUE: Multiplanar, multisequence MR imaging of the lumbar spine was performed. No intravenous contrast was administered.   COMPARISON:  None Available.   FINDINGS: Segmentation:  Standard.   Alignment:  Grade 1 anterolisthesis of L4 on L5.   Vertebrae: No acute fracture, evidence of discitis, or aggressive bone lesion.   Conus medullaris and cauda equina: Conus extends to the L1-2 level. Conus and cauda equina appear normal.   Paraspinal and other soft tissues: No acute paraspinal abnormality.   Disc levels:   Disc spaces: Disc desiccation throughout the lumbar spine. Mild disc height loss at T10-11 and L4-5.   T12-L1: No significant disc bulge. Mild bilateral facet arthropathy. No foraminal or central canal stenosis.   L1-L2: No significant disc bulge. Mild bilateral facet arthropathy. No foraminal or central canal stenosis.   L2-L3: Mild broad-based disc bulge. Mild bilateral facet arthropathy. No foraminal or central canal stenosis.   L3-L4: Broad-based disc bulge. Moderate bilateral facet arthropathy. Moderate spinal stenosis. Mild left and moderate right foraminal stenosis.   L4-L5: Broad-based disc bulge. Severe bilateral facet arthropathy. Severe spinal stenosis. Moderate bilateral foraminal stenosis.   L5-S1: Mild broad-based disc bulge. Severe left and mild right facet arthropathy. No foraminal or central canal stenosis.   IMPRESSION: 1. Lumbar spine spondylosis as described above. 2. No acute osseous injury of the lumbar spine.     Electronically Signed   By: Elige Ko M.D.   On: 11/03/2022 08:55     Objective:  VS:  HT:    WT:   BMI:     BP:(!) 143/79  HR:(!) 102bpm  TEMP: ( )  RESP:  Physical Exam Vitals and nursing note reviewed.  Constitutional:      General: She is not in acute distress.     Appearance: Normal appearance. She is not ill-appearing.  HENT:     Head: Normocephalic and atraumatic.     Right Ear: External ear normal.     Left Ear: External ear normal.  Eyes:     Extraocular Movements: Extraocular movements intact.  Cardiovascular:     Rate and Rhythm: Normal rate.     Pulses: Normal pulses.  Pulmonary:     Effort: Pulmonary effort is normal. No respiratory distress.  Abdominal:     General: There is no distension.     Palpations: Abdomen is soft.  Musculoskeletal:        General: Tenderness present.     Cervical back: Neck supple.     Right lower leg: No edema.     Left lower leg: No edema.     Comments: Patient has good distal strength with no pain over the greater trochanters.  No clonus or focal weakness.  Skin:    Findings: No erythema, lesion or rash.  Neurological:     General: No focal deficit present.     Mental Status: She is alert and oriented to person, place, and time.     Sensory: No sensory deficit.     Motor: No weakness or abnormal muscle tone.     Coordination: Coordination normal.  Psychiatric:  Mood and Affect: Mood normal.        Behavior: Behavior normal.      Imaging: No results found.

## 2022-12-04 NOTE — Procedures (Signed)
Lumbosacral Transforaminal Epidural Steroid Injection - Sub-Pedicular Approach with Fluoroscopic Guidance  Patient: Michelle Hicks      Date of Birth: [birth year] MRN: [id number] PCP: Dorothyann Peng, MD      Visit Date: 11/21/2022   Universal Protocol:    Date/Time: 11/21/2022  Consent Given By: the patient  Position: PRONE  Additional Comments: Vital signs were monitored before and after the procedure. Patient was prepped and draped in the usual sterile fashion. The correct patient, procedure, and site was verified.   Injection Procedure Details:   Procedure diagnoses: Lumbar radiculopathy [M54.16]    Meds Administered:  Meds ordered this encounter  Medications   methylPREDNISolone acetate (DEPO-MEDROL) injection 40 mg    Laterality: Right  Location/Site: L4  Needle:5.0 in., 22 ga.  Short bevel or Quincke spinal needle  Needle Placement: Transforaminal  Findings:    -Comments: Excellent flow of contrast along the nerve, nerve root and into the epidural space.  Procedure Details: After squaring off the end-plates to get a true AP view, the C-arm was positioned so that an oblique view of the foramen as noted above was visualized. The target area is just inferior to the "nose of the scotty dog" or sub pedicular. The soft tissues overlying this structure were infiltrated with 2-3 ml. of 1% Lidocaine without Epinephrine.  The spinal needle was inserted toward the target using a "trajectory" view along the fluoroscope beam.  Under AP and lateral visualization, the needle was advanced so it did not puncture dura and was located close the 6 O'Clock position of the pedical in AP tracterory. Biplanar projections were used to confirm position. Aspiration was confirmed to be negative for CSF and/or blood. A 1-2 ml. volume of Isovue-250 was injected and flow of contrast was noted at each level. Radiographs were obtained for documentation purposes.   After attaining the desired flow  of contrast documented above, a 0.5 to 1.0 ml test dose of 0.25% Marcaine was injected into each respective transforaminal space.  The patient was observed for 90 seconds post injection.  After no sensory deficits were reported, and normal lower extremity motor function was noted,   the above injectate was administered so that equal amounts of the injectate were placed at each foramen (level) into the transforaminal epidural space.   Additional Comments:  No complications occurred Dressing: 2 x 2 sterile gauze and Band-Aid    Post-procedure details: Patient was observed during the procedure. Post-procedure instructions were reviewed.  Patient left the clinic in stable condition.

## 2022-12-07 ENCOUNTER — Other Ambulatory Visit: Payer: Self-pay | Admitting: Internal Medicine

## 2022-12-13 ENCOUNTER — Encounter: Payer: Self-pay | Admitting: Orthopedic Surgery

## 2022-12-13 ENCOUNTER — Other Ambulatory Visit: Payer: Self-pay | Admitting: Internal Medicine

## 2022-12-13 ENCOUNTER — Ambulatory Visit: Payer: Self-pay

## 2022-12-13 DIAGNOSIS — M5416 Radiculopathy, lumbar region: Secondary | ICD-10-CM

## 2022-12-13 MED ORDER — OZEMPIC (2 MG/DOSE) 8 MG/3ML ~~LOC~~ SOPN: PEN_INJECTOR | SUBCUTANEOUS | 2 refills | Status: AC

## 2022-12-13 NOTE — Patient Instructions (Signed)
Visit Information  Thank you for taking time to visit with me today. Please don't hesitate to contact me if I can be of assistance to you.   Following are the goals we discussed today:   Goals Addressed             This Visit's Progress    To continue to work on lowering A1c   On track    Care Coordination Interventions: Evaluation of current treatment plan related to type 2 diabetes mellitus  and patient's adherence to plan as established by provider Review of patient status, including review of consultants reports, relevant laboratory and other test results, and medications completed Collaborated with PCP about increasing patient's Ozempic to 2 mg weekly and PCP is agreeable  Educated patient about signs/symptoms to be aware of with dosage increase and when to call her doctor if symptoms or concerns arise and patient verbalizes understanding  Counseled on Diabetic diet, my plate method, 161 minutes of moderate intensity exercise/week Lab Results  Component Value Date   HGBA1C 6.8 (H) 11/29/2022       To get relief from low back pain   On track    Care Coordination Interventions: Reviewed provider established plan for pain management Discussed importance of adherence to all scheduled medical appointments Counseled on the importance of reporting any/all new or changed pain symptoms or management strategies to pain management provider Advised patient to report to care team affect of pain on daily activities Reviewed with patient prescribed pharmacological and nonpharmacological pain relief strategies Advised patient to discuss outpatient PT  with provider Educated patient about dry needling and encouraged her to talk to her PT about this procedure Mailed printed educational material related to Dry Needling           Our next appointment is by telephone on 12/27/22 at 11:00 AM  Please call the care guide team at [phone] if you need to cancel or reschedule your  appointment.   If you are experiencing a Mental Health or Behavioral Health Crisis or need someone to talk to, please call 1-800-273-TALK (toll free, 24 hour hotline)  Patient verbalizes understanding of instructions and care plan provided today and agrees to view in MyChart. Active MyChart status and patient understanding of how to access instructions and care plan via MyChart confirmed with patient.     Delsa Sale RN BSN CCM   [facility], [facility] Nurse Care Coordinator  Direct Dial: [phone] Website: [email or web address]

## 2022-12-13 NOTE — Patient Outreach (Signed)
  Care Coordination   Follow Up Visit Note   12/13/2022 Name: Michelle Hicks MRN: [id number] DOB: 08/21/1950  Michelle Hicks is a 72 y.o. year old female who sees Dorothyann Peng, MD for primary care. I spoke with  Michelle Hicks by phone today.  What matters to the patients health and wellness today?  Patient would like to work on lowering her A1c and losing weight.     Goals Addressed             This Visit's Progress    To continue to work on lowering A1c   On track    Care Coordination Interventions: Evaluation of current treatment plan related to type 2 diabetes mellitus  and patient's adherence to plan as established by provider Review of patient status, including review of consultants reports, relevant laboratory and other test results, and medications completed Collaborated with PCP about increasing patient's Ozempic to 2 mg weekly and PCP is agreeable  Educated patient about signs/symptoms to be aware of with dosage increase and when to call her doctor if symptoms or concerns arise and patient verbalizes understanding  Counseled on Diabetic diet, my plate method, 782 minutes of moderate intensity exercise/week Lab Results  Component Value Date   HGBA1C 6.8 (H) 11/29/2022        To get relief from low back pain   On track    Care Coordination Interventions: Reviewed provider established plan for pain management Discussed importance of adherence to all scheduled medical appointments Counseled on the importance of reporting any/all new or changed pain symptoms or management strategies to pain management provider Advised patient to report to care team affect of pain on daily activities Reviewed with patient prescribed pharmacological and nonpharmacological pain relief strategies Advised patient to discuss outpatient PT  with provider Educated patient about dry needling and encouraged her to talk to her PT about this procedure Mailed printed educational material  related to Dry Needling      Interventions Today    Flowsheet Row Most Recent Value  Chronic Disease   Chronic disease during today's visit Diabetes, Other  [chronic low back pain,  weight loss]  General Interventions   General Interventions Discussed/Reviewed General Interventions Discussed, General Interventions Reviewed, Doctor Visits, Labs, Communication with  Doctor Visits Discussed/Reviewed Doctor Visits Discussed, Doctor Visits Reviewed, PCP, Specialist  PCP/Specialist Visits Contact provider for referral to  [facility] provider for referral to Specialist  [outpatient PT]  Communication with PCP/Specialists  [PCP Dr. Allyne Gee  Exercise Interventions   Exercise Discussed/Reviewed Physical Activity, Exercise Reviewed, Exercise Discussed, Weight Managment  Physical Activity Discussed/Reviewed Physical Activity Discussed, Physical Activity Reviewed, Types of exercise, Home Exercise Program (HEP)  Weight Management Weight loss  Education Interventions   Education Provided Provided Education, Provided Printed Education  Provided Verbal Education On When to see the doctor, Exercise, Medication, Labs  Labs Reviewed Hgb A1c  Pharmacy Interventions   Pharmacy Dicussed/Reviewed Pharmacy Topics Reviewed, Pharmacy Topics Discussed, Medications and their functions       SDOH assessments and interventions completed:  No     Care Coordination Interventions:  Yes, provided   Follow up plan: Follow up call scheduled for 12/27/22 @11 :00 AM    Encounter Outcome:  Patient Visit Completed

## 2022-12-25 ENCOUNTER — Encounter: Payer: Self-pay | Admitting: Internal Medicine

## 2022-12-25 DIAGNOSIS — D892 Hypergammaglobulinemia, unspecified: Secondary | ICD-10-CM | POA: Diagnosis not present

## 2022-12-25 DIAGNOSIS — R2689 Other abnormalities of gait and mobility: Secondary | ICD-10-CM | POA: Diagnosis not present

## 2022-12-25 DIAGNOSIS — D472 Monoclonal gammopathy: Secondary | ICD-10-CM | POA: Diagnosis not present

## 2022-12-25 DIAGNOSIS — M5459 Other low back pain: Secondary | ICD-10-CM | POA: Diagnosis not present

## 2022-12-27 ENCOUNTER — Ambulatory Visit: Payer: Self-pay

## 2022-12-27 DIAGNOSIS — G4733 Obstructive sleep apnea (adult) (pediatric): Secondary | ICD-10-CM

## 2022-12-27 DIAGNOSIS — E1169 Type 2 diabetes mellitus with other specified complication: Secondary | ICD-10-CM

## 2022-12-27 DIAGNOSIS — I10 Essential (primary) hypertension: Secondary | ICD-10-CM

## 2022-12-27 NOTE — Patient Instructions (Signed)
Visit Information  Thank you for taking time to visit with me today. Please don't hesitate to contact me if I can be of assistance to you.   Following are the goals we discussed today:   Goals Addressed             This Visit's Progress    To continue to work on lowering A1c   On track    Care Coordination Interventions: Evaluation of current treatment plan related to type 2 diabetes mellitus  and patient's adherence to plan as established by provider Determined patient did not pick up her 2 mg Ozempic due to cost, she is currently getting patient assistance for Ozempic Sent referral to [employer] RPH-CPP requesting assistance with cost for increased dose of Ozempic Discussed patient has started outpatient PT at [facility], she voices understanding of importance to adhere to her daily HEP Discussed plans with patient for ongoing care coordination follow up and provided patient with direct contact information for nurse care coordinator Lab Results  Component Value Date   HGBA1C 6.8 (H) 11/29/2022          To get relief from low back pain   On track    Care Coordination Interventions: Reviewed provider established plan for pain management Determined patient has started working with outpatient PT for management of chronic low back pain  Discussed patient plans to ask to receive dry needling during next scheduled visit Positive reinforcement given to patient for making efforts to improve her health         Our next appointment is by telephone on 04/09/23 at 11:00 AM  Please call the care guide team at [phone] if you need to cancel or reschedule your appointment.   If you are experiencing a Mental Health or Behavioral Health Crisis or need someone to talk to, please call 1-800-273-TALK (toll free, 24 hour hotline)  Patient verbalizes understanding of instructions and care plan provided today and agrees to view in MyChart. Active MyChart status and patient  understanding of how to access instructions and care plan via MyChart confirmed with patient.     Delsa Sale RN BSN CCM [place]  [facility], [facility] Health Nurse Care Coordinator  Direct Dial: [phone] Website: [email or web address]

## 2022-12-27 NOTE — Patient Outreach (Addendum)
  Care Coordination   Follow Up Visit Note   12/27/2022 Name: Michelle Hicks MRN: [id number] DOB: 12/11/1950  Michelle Hicks is a 72 y.o. year old female who sees Dorothyann Peng, MD for primary care. I spoke with  Michelle Hicks by phone today.  What matters to the patients health and wellness today?  Patient would like to continue working with outpatient PT for improvement in mobility, and decreasing her back pain.    Goals Addressed             This Visit's Progress    To continue to work on lowering A1c   On track    Care Coordination Interventions: Evaluation of current treatment plan related to type 2 diabetes mellitus  and patient's adherence to plan as established by provider Determined patient did not pick up her 2 mg Ozempic due to cost, she is currently getting patient assistance for Ozempic Sent referral to [employer] RPH-CPP requesting assistance with cost for increased dose of Ozempic Discussed patient has started outpatient PT at [facility], she voices understanding of importance to adhere to her daily HEP Discussed plans with patient for ongoing care coordination follow up and provided patient with direct contact information for nurse care coordinator Lab Results  Component Value Date   HGBA1C 6.8 (H) 11/29/2022       To get relief from low back pain   On track    Care Coordination Interventions: Reviewed provider established plan for pain management Determined patient has started working with outpatient PT for management of chronic low back pain  Discussed patient plans to ask to receive dry needling during next scheduled visit Positive reinforcement given to patient for making efforts to improve her health     Interventions Today    Flowsheet Row Most Recent Value  Chronic Disease   Chronic disease during today's visit Other  [Radiculopathy, lumbar region]  General Interventions   General Interventions Discussed/Reviewed General Interventions  Discussed, General Interventions Reviewed, Doctor Visits, Communication with  Doctor Visits Discussed/Reviewed Doctor Visits Discussed, Doctor Visits Reviewed, Specialist  Communication with Pharmacists  Magdalene Molly RPH-CPP]  Exercise Interventions   Exercise Discussed/Reviewed Exercise Discussed, Exercise Reviewed, Physical Activity  Physical Activity Discussed/Reviewed Physical Activity Discussed, Physical Activity Reviewed, Home Exercise Program (HEP)  Education Interventions   Education Provided Provided Education  Provided Verbal Education On Medication, Exercise  Pharmacy Interventions   Pharmacy Dicussed/Reviewed Pharmacy Topics Discussed, Pharmacy Topics Reviewed, Affording Medications, Referral to Pharmacist  Magdalene Molly RPH-CPP]          SDOH assessments and interventions completed:  No     Care Coordination Interventions:  Yes, provided   Follow up plan: Referral made to [employer] RPH-CPP to assist with cost of increased dosage of Ozempic Follow up call scheduled for 04/09/23 @11 :00 AM    Encounter Outcome:  Patient Visit Completed

## 2022-12-29 ENCOUNTER — Telehealth: Payer: Self-pay

## 2022-12-29 NOTE — Progress Notes (Signed)
   Care Guide Note  12/29/2022 Name: RINDA CLOUTHIER MRN: [id number] DOB: [birth year]  Referred by: Dorothyann Peng, MD Reason for referral : No chief complaint on file.   ALINDA PLICHTA is a 72 y.o. year old female who is a primary care patient of Dorothyann Peng, MD. Sereena Erdahl Luna was referred to the pharmacist for assistance related to HTN and DM.    Successful contact was made with the patient to discuss pharmacy services including being ready for the pharmacist to call at least 5 minutes before the scheduled appointment time, to have medication bottles and any blood sugar or blood pressure readings ready for review. The patient agreed to meet with the pharmacist via with the pharmacist via telephone visit on (date/time).  01/01/2023  Penne Lash, RMA Care Guide [facility]  [place], [place] [address] Direct Dial: [phone] [email or web address]

## 2023-01-01 ENCOUNTER — Other Ambulatory Visit (HOSPITAL_COMMUNITY): Payer: Self-pay

## 2023-01-01 ENCOUNTER — Other Ambulatory Visit: Payer: Self-pay | Admitting: Pharmacist

## 2023-01-01 ENCOUNTER — Other Ambulatory Visit: Payer: Medicare Other

## 2023-01-01 ENCOUNTER — Other Ambulatory Visit: Payer: Self-pay | Admitting: Cardiovascular Disease

## 2023-01-01 ENCOUNTER — Telehealth: Payer: Self-pay | Admitting: Pharmacist Clinician (PhC)/ Clinical Pharmacy Specialist

## 2023-01-01 DIAGNOSIS — G720 Drug-induced myopathy: Secondary | ICD-10-CM

## 2023-01-01 DIAGNOSIS — M5459 Other low back pain: Secondary | ICD-10-CM | POA: Diagnosis not present

## 2023-01-01 DIAGNOSIS — E785 Hyperlipidemia, unspecified: Secondary | ICD-10-CM

## 2023-01-01 DIAGNOSIS — I700 Atherosclerosis of aorta: Secondary | ICD-10-CM

## 2023-01-01 DIAGNOSIS — R2689 Other abnormalities of gait and mobility: Secondary | ICD-10-CM | POA: Diagnosis not present

## 2023-01-01 DIAGNOSIS — E1169 Type 2 diabetes mellitus with other specified complication: Secondary | ICD-10-CM

## 2023-01-01 NOTE — Telephone Encounter (Signed)
Can you please update the Repatha PA?

## 2023-01-01 NOTE — Progress Notes (Signed)
Pharmacy Medication Assistance Program Note    01/01/2023  Patient ID: Michelle Hicks, female   DOB: [birth year], 72 y.o.   MRN: [id number]     01/01/2023  Outreach Medication One  Initial Outreach Date (Medication One) 01/01/2023  Manufacturer Medication One [employer] Drugs Ozempic  Dose of Ozempic 2 mg  Type of Radiographer, therapeutic Assistance  Date Application Sent to Patient 01/01/2023  Application Items Requested Application  Date Application Sent to Prescriber 01/01/2023  Name of Prescriber Dorothyann Peng  Date Application Received From Patient 01/01/2023  Application Items Received From Patient Application  Date Application Received From Provider 01/01/2023  Date Application Submitted to Manufacturer 01/01/2023  Method Application Sent to Manufacturer Online         01/01/2023  Outreach Medication Two  Initial Outreach Date (Medication Two) 01/01/2023  Manufacturer Medication Two Other Manufacturer/Drug  Other Drugs Repatha  Dose of Other Drug 140 mg  Type of Assistance [employer]  Type of [employer]  [place] [facility] [place] Hypercholesterolemia  [employer] Start Date 12/02/2022  Kennedy Bucker Approval End Date 12/01/2023  Billing Information [id number]; PCN: [id number]; Group: [id number]; [id number]  Date Application Received From Patient 01/01/2023  Application Items Received From Patient Application  Date Application Received From Provider 01/01/2023  Method Application Sent to Manufacturer Online  Date Application Submitted to Manufacturer 01/01/2023  Patient Assistance Determination Approved     Catie Eppie Gibson, PharmD, BCACP, CPP Clinical Pharmacist [facility] [phone]

## 2023-01-01 NOTE — Progress Notes (Signed)
01/01/2023 Name: Michelle Hicks MRN: [id number] DOB: [birth year]  Chief Complaint  Patient presents with   Medication Management   Diabetes    Michelle Hicks is a 72 y.o. year old female who presented for a telephone visit.   They were referred to the pharmacist by their PCP for assistance in managing diabetes, hyperlipidemia, and medication access.    Subjective:  Care Team: Primary Care Provider: Dorothyann Peng, MD ; Next Scheduled Visit: 04/04/22 Cardiologist: Duke Salvia; Next Scheduled Visit: 01/19/23  Medication Access/Adherence  Current Pharmacy:  [id number] - [place], [place] - [address] AT [facility] OF [address] [place] [address] Phone: [phone] Fax: [phone]  [facility] - [place], [place] - [phone] [address] [place] [address] Phone: [phone] Fax: [phone]   Patient reports affordability concerns with their medications: Yes  Patient reports access/transportation concerns to their pharmacy: No  Patient reports adherence concerns with their medications:  No     Diabetes:  Current medications: Ozempic 1 mg weekly - was instructed to increase to 2 mg but we need to send updated script to [employer] Medications tried in the past: saxagliptin   Current glucose readings: fasting <100; post prandial <180  Current medication access support: Enrolled in Ozempic patient assistance  Hypertension:  Current medications: amlodipine 5 mg daily, hydrochlorothiazide 25 mg daily, telmisartan 80 mg daily  Patient has an automated, upper arm home BP cuff Current blood pressure readings readings: 121/62  Patient denies hypotensive s/sx including dizziness, lightheadedness.  Patient denies hypertensive symptoms including headache, chest pain, shortness of breath   Hyperlipidemia/ASCVD Risk Reduction  Current lipid lowering medications:  Repatha 140 mg every 2 weeks Medications tried in the past: CK elevation with statins   Objective:  Lab Results  Component Value Date   HGBA1C 6.8 (H) 11/29/2022    Lab Results  Component Value Date   CREATININE 0.79 11/29/2022   BUN 15 11/29/2022   NA 139 11/29/2022   K 4.4 11/29/2022   CL 102 11/29/2022   CO2 23 11/29/2022    Lab Results  Component Value Date   CHOL 151 03/14/2022   HDL 85 03/14/2022   LDLCALC 51 03/14/2022   TRIG 77 03/14/2022   CHOLHDL 1.8 03/14/2022    Medications Reviewed Today     Reviewed by Nelma Rothman, Student-PharmD (Student-PharmD) on 01/01/23 at 1340  Med List Status: <None>   Medication Order Taking? Sig Documenting Provider Last Dose Status Informant  Accu-Chek Softclix Lancets lancets [id number]  Use as instructed to check blood sugars twice daily E11.69 Dorothyann Peng, MD  Active   amLODipine (NORVASC) 5 MG tablet [id number] Yes TAKE 1 TABLET(5 MG) BY MOUTH DAILY [place] Si, MD Taking Active   Blood Glucose Monitoring Suppl (ACCU-CHEK GUIDE) w/Device KIT [id number]  Use to check blood sugars twice daily E11.69 Dorothyann Peng, MD  Active   cyclobenzaprine (FLEXERIL) 10 MG tablet [id number] No Take 1 tablet (10 mg total) by mouth 3 (three) times daily as needed for muscle spasms.  Patient not taking: Reported on 01/01/2023   London Sheer, MD Not Taking Active   diazepam (VALIUM) 5 MG tablet [id number] No Take one tablet by mouth with food one hour prior to procedure. May repeat 30 minutes prior if needed.  Patient not taking: Reported on 11/29/2022   Juanda Chance, NP Not Taking Active   Evolocumab (REPATHA  SURECLICK) 140 MG/ML SOAJ [id number] Yes Inject 140 mg into the skin every 14 (fourteen) days. [place] Si, MD Taking Active   fluticasone [facility]) 50 MCG/ACT nasal spray [id number] Yes Place 1 spray into both nostrils daily. [provider] Taking Active Self  folic acid (FOLVITE) 1 MG tablet [id number] Yes  Take 1 mg by mouth daily. [provider] Taking Active   gabapentin (NEURONTIN) 100 MG capsule [id number] No Take 1 capsule (100 mg total) by mouth daily.  Patient not taking: Reported on 01/01/2023   Dorothyann Peng, MD Not Taking Active   glucose blood (ACCU-CHEK GUIDE) test strip [id number]  USE TO CHECK BLOOD SUGAR TWICE DAILY AS DIRECTED Dorothyann Peng, MD  Active   hydrochlorothiazide (HYDRODIURIL) 25 MG tablet [id number] Yes TAKE 1 TABLET BY MOUTH DAILY Dorothyann Peng, MD Taking Active   hydroxychloroquine (PLAQUENIL) 200 MG tablet [id number] Yes Take 200 mg by mouth daily. [provider] Taking Active Self  levothyroxine (SYNTHROID) 50 MCG tablet [id number] Yes Take 50 mcg by mouth daily.  [provider] Taking Active Self  methotrexate (RHEUMATREX) 2.5 MG tablet [id number] Yes Take 10 mg by mouth once a week. [provider] Taking Active   Multiple Vitamin (MULTIVITAMIN) tablet [id number] No Take 1 tablet by mouth daily.  Patient not taking: Reported on 11/29/2022   [provider] Not Taking Active Self  predniSONE (DELTASONE) 5 MG tablet [id number] Yes Take 5 mg by mouth daily. [provider] Taking Active   Semaglutide, 2 MG/DOSE, (OZEMPIC, 2 MG/DOSE,) 8 MG/3ML SOPN [id number] Yes Inject 2mg  sq once Lawerance Sabal, MD Taking Active            Med Note Kinnie Scales Jan 01, 2023  1:38 PM) 1 mg weekly  sodium chloride flush (NS) 0.9 % injection 3 mL [id number]   [place] Si, MD  Active   telmisartan (MICARDIS) 80 MG tablet [id number] Yes TAKE 1 TABLET BY MOUTH DAILY Dorothyann Peng, MD Taking Active   traZODone (DESYREL) 50 MG tablet [id number] Yes TAKE 1 TABLET(50 MG) BY MOUTH AT BEDTIME AS NEEDED FOR SLEEP Dohmeier, Porfirio Mylar, MD Taking Active   Vitamin D, Cholecalciferol, 25 MCG (1000 UT) CAPS [id number] Yes Take by mouth. [provider] Taking Active Self              Assessment/Plan:   Diabetes: -  Currently controlled but has not received prescribed dose of Ozempic 2 mg weekly yet - Collaborated with provider to complete Dose Change form for Ozempic 2 mg weekly. Order sent to [employer] - Assisted in completion of online re-enrollment for 2025 for [employer] - Recommend to continue current regimen   Hypertension: - Currently controlled - Recommend to continue current regimen at this time   Hyperlipidemia/ASCVD Risk Reduction: - Currently controlled but with access concerns - Collaborated with [facility] team. They will complete PA for Repatha. Completed [employer] Re-enrollment - 12/02/22-12/01/23; BIN: [id number]; PCN: [id number]; Group: [id number]; PJ:[id number]  - Recommend to continue current regimen   Follow Up Plan: medication access needs addressed. Follow up with PCP, cardiology as scheduled  Catie Eppie Gibson, PharmD, BCACP, CPP Clinical Pharmacist [facility] [phone]

## 2023-01-01 NOTE — Patient Instructions (Signed)
Ms. Laubenstein,   It was great talking to you today!  I have sent the updated prescription for Ozempic 2 mg weekly to [employer] for 2024. We have also completed the online re-enrollment process for 2025. It can take the company a few weeks to process applications.   The [facility] team will complete the insurance re-authorization for the Repatha, and we will provide the [employer] information to the pharmacy.   Please reach out with any questions!  Catie Eppie Gibson, PharmD, BCACP, CPP Clinical Pharmacist [facility] [phone]

## 2023-01-01 NOTE — Progress Notes (Signed)
FYI - Completed [employer]Well Re-enrollment - 12/02/22-12/01/23; BIN: [id number]; PCN: [id number]; Group: [id number]; [id number]

## 2023-01-02 NOTE — Telephone Encounter (Signed)
Repatha refill  

## 2023-01-08 DIAGNOSIS — R2689 Other abnormalities of gait and mobility: Secondary | ICD-10-CM | POA: Diagnosis not present

## 2023-01-08 DIAGNOSIS — M5459 Other low back pain: Secondary | ICD-10-CM | POA: Diagnosis not present

## 2023-01-14 ENCOUNTER — Other Ambulatory Visit: Payer: Self-pay | Admitting: Internal Medicine

## 2023-01-15 ENCOUNTER — Telehealth: Payer: Self-pay | Admitting: Pharmacy Technician

## 2023-01-15 ENCOUNTER — Other Ambulatory Visit (HOSPITAL_COMMUNITY): Payer: Self-pay

## 2023-01-15 MED ORDER — REPATHA SURECLICK 140 MG/ML ~~LOC~~ SOAJ: 140.0000 mg | SUBCUTANEOUS | 3 refills | Status: DC

## 2023-01-15 NOTE — Telephone Encounter (Signed)
Mychart message sent.

## 2023-01-15 NOTE — Addendum Note (Signed)
Addended by: Rosalee Kaufman on: 01/15/2023 11:42 AM   Modules accepted: Orders

## 2023-01-15 NOTE — Telephone Encounter (Signed)
Pharmacy Patient Advocate Encounter   Received notification from Pt Calls Messages that prior authorization for repatha is required/requested.   Insurance verification completed.   The patient is insured through [facility] .   Per test claim: PA required; PA submitted to above mentioned insurance via CoverMyMeds Key/confirmation #/EOC [facility] Status is pending

## 2023-01-15 NOTE — Telephone Encounter (Signed)
Pharmacy Patient Advocate Encounter  Received notification from [facility] that Prior Authorization for repatha has been APPROVED from 01/15/23 to 02/13/24. Ran test claim, Copay is $47.00. This test claim was processed through [facility]- copay amounts may vary at other pharmacies due to pharmacy/plan contracts, or as the patient moves through the different stages of their insurance plan.   PA #/Case ID/Reference #: [id number]

## 2023-01-17 DIAGNOSIS — H47323 Drusen of optic disc, bilateral: Secondary | ICD-10-CM | POA: Diagnosis not present

## 2023-01-17 DIAGNOSIS — H2513 Age-related nuclear cataract, bilateral: Secondary | ICD-10-CM | POA: Diagnosis not present

## 2023-01-17 DIAGNOSIS — M609 Myositis, unspecified: Secondary | ICD-10-CM | POA: Diagnosis not present

## 2023-01-17 DIAGNOSIS — E119 Type 2 diabetes mellitus without complications: Secondary | ICD-10-CM | POA: Diagnosis not present

## 2023-01-17 DIAGNOSIS — Z79899 Other long term (current) drug therapy: Secondary | ICD-10-CM | POA: Diagnosis not present

## 2023-01-17 LAB — HM DIABETES EYE EXAM

## 2023-01-19 ENCOUNTER — Ambulatory Visit (HOSPITAL_BASED_OUTPATIENT_CLINIC_OR_DEPARTMENT_OTHER): Payer: Medicare Other | Admitting: Cardiovascular Disease

## 2023-01-19 ENCOUNTER — Other Ambulatory Visit: Payer: Self-pay | Admitting: Pharmacist

## 2023-01-19 ENCOUNTER — Encounter (HOSPITAL_BASED_OUTPATIENT_CLINIC_OR_DEPARTMENT_OTHER): Payer: Self-pay | Admitting: Cardiovascular Disease

## 2023-01-19 VITALS — BP 134/64 | HR 94 | Ht 64.0 in | Wt 245.2 lb

## 2023-01-19 DIAGNOSIS — I318 Other specified diseases of pericardium: Secondary | ICD-10-CM

## 2023-01-19 DIAGNOSIS — I700 Atherosclerosis of aorta: Secondary | ICD-10-CM

## 2023-01-19 DIAGNOSIS — I10 Essential (primary) hypertension: Secondary | ICD-10-CM

## 2023-01-19 NOTE — Progress Notes (Signed)
Cardiology Office Note:  .   Date:  01/19/2023  ID:  Michelle Hicks, DOB 12-10-1950, MRN [id number] PCP: Michelle Peng, MD  [facility] Providers Cardiologist:  Michelle Si, MD    History of Present Illness: .    Michelle Hicks is a 73 y.o. female with a hx of hypertension, diabetes mellitus, OSA, and depression, here for follow-up of frequent heart palpitations. She saw Dr. Allyne Hicks 03/2021. She was referred for an echocardiogram 05/2021 that revealed LVEF 60 to 65% with mild LVH and moderate hypertrophy of the basal septum.  She had normal diastolic function.  She had a coronary CTA 05/2021 that revealed normal coronary arteries.  However there is some thickening and calcification of her pericardium.  There was concern for elevated pulmonary pressures.  The study showed signs of GERD and a small pulmonary nodule.  She has had some issues with muscle stiffness but did not improve after stopping her statin. She had a left and right heart cath 07/2021, that revealed normal coronaries and no evidence of constriction.     At her visit 06/2021 she was generally feeling fine other than some acid reflux. She also reported a pre-syncopal episode of low blood pressure. She had lost 10 lbs and planned to continue with her weight loss.  At her visit 01/2022 she noted that she was feeling more tired and had some episodes of tachycardia at rest.  She wore a 30-day monitor that revealed rare PACs and PVCs and 7 beats of NSVT.  She follow-up with Michelle Shields, NP and had some low blood pressures but no symptoms.  She was given metoprolol to take as needed because she was not having many palpitations at the time.  Echo 05/2022 revealed LVEF 60-65% with mild LVH and grade 1 diastolic dysfunction.  She saw Michelle Hicks again 06/2022 and was feeling well.   Michelle Hicks presents with complaints of fatigue and disrupted sleep. She reports waking up after approximately six hours of sleep and then staying awake for  several hours before being able to fall back asleep. She denies feeling tired upon waking but notes increasing fatigue as the day progresses. She uses a CPAP machine regularly and has tried trazodone for sleep, which initially helped but seems less effective now.  Michelle Hicks also reports chronic back pain and ascites, which have limited her physical activity. She has previously participated in a prep program at the lab, which she found beneficial, but has had to stop due to back pain and a couple of falls.  She is currently on a regimen of amlodipine, hydrochlorothiazide, telmisartan, and Repatha. She expresses concern about the cost of Repatha and potential future insurance coverage issues. She has been taking her blood pressure medication at varying times, often upon waking in the early morning. Her home blood pressure readings have been around 121/69.  Her cholesterol was well-controlled at the last check a year ago, and a recent echocardiogram showed normal heart function. She denies any chest pain or pressure.     ROS:  As per HPI  Studies Reviewed: .       30 Day Event Monitor 02/2022   Quality: Fair.  Baseline artifact. Predominant rhythm: sinus rhythm Average heart rate: 87 bpm Max heart rate: 159 bpm Min heart rate: 67 bpm   Rare PACs and PVCs 7 beats NSVT  Echo 05/2022: 1. Left ventricular ejection fraction, by estimation, is 60 to 65%. The  left ventricle has normal function. The left ventricle  has no regional  wall motion abnormalities. There is mild left ventricular hypertrophy.  Left ventricular diastolic parameters  are consistent with Grade I diastolic dysfunction (impaired relaxation).   2. Right ventricular systolic function is normal. The right ventricular  size is normal. Tricuspid regurgitation signal is inadequate for assessing  PA pressure.   3. The mitral valve is normal in structure. No evidence of mitral valve  regurgitation. No evidence of mitral stenosis.    4. The aortic valve was not well visualized. Aortic valve regurgitation  is not visualized. Aortic valve sclerosis/calcification is present,  without any evidence of aortic stenosis.   5. The inferior vena cava is normal in size with greater than 50%  respiratory variability, suggesting right atrial pressure of 3 mmHg.   6. No evidence of constrictive pericarditis   Risk Assessment/Calculations:             Physical Exam:   VS:  BP 134/64   Pulse 94   Ht 5\' 4"  (1.626 m)   Wt 245 lb 3.2 oz (111.2 kg)   SpO2 99%   BMI 42.09 kg/m  , BMI Body mass index is 42.09 kg/m. GENERAL:  Well appearing HEENT: Pupils equal round and reactive, fundi not visualized, oral mucosa unremarkable NECK:  No jugular venous distention, waveform within normal limits, carotid upstroke brisk and symmetric, no bruits, no thyromegaly LUNGS:  Clear to auscultation bilaterally HEART:  RRR.  PMI not displaced or sustained,S1 and S2 within normal limits, no S3, no S4, no clicks, no rubs, no murmurs ABD:  Flat, positive bowel sounds normal in frequency in pitch, no bruits, no rebound, no guarding, no midline pulsatile mass, no hepatomegaly, no splenomegaly EXT:  2 plus pulses throughout, no edema, no cyanosis no clubbing SKIN:  No rashes no nodules NEURO:  Cranial nerves II through XII grossly intact, motor grossly intact throughout PSYCH:  Cognitively intact, oriented to person place and time   ASSESSMENT AND PLAN: .    # Fatigue Likely secondary to poor sleep quality and quantity. No morning fatigue, but tiredness increases throughout the day. Patient uses CPAP and has fragmented sleep with frequent awakenings. -Encouraged to increase physical activity as tolerated to improve sleep quality. -Referred to Right Start program for guided exercise regimen.  # Hypertension Home readings around 121/69, but office reading was elevated. Patient takes Amlodipine, Hydrochlorothiazide, and Telmisartan.  She prefers to  split her medications throughout the day rather than a combination pill.  Advised her to take them the same time every day rather than sporadically.  -Continue current antihypertensive regimen. -Track blood pressure readings at home and bring to next appointment with Dr. Allyne Hicks for further evaluation.  # Hyperlipidemia Currently managed with Repatha, last cholesterol check was within normal limits. Patient concerned about potential future insurance coverage issues. -Continue Repatha as long as insurance covers it. -Plan to recheck cholesterol at next appointment with Dr. Allyne Hicks.  # Falls Two recent falls, possibly related to balance issues. -Referred to Right Start program for guided exercise regimen, which may help improve balance and prevent future falls.  General Health Maintenance -Continue current medications, including CPAP for sleep apnea. -Track blood pressure readings at home and bring to next appointment with Dr. Allyne Hicks. -Participate in Right Start program for guided exercise regimen. -Plan for follow-up in one year, or sooner if needed.       Signed, Michelle Si, MD

## 2023-01-19 NOTE — Progress Notes (Signed)
Pharmacy Medication Assistance Program Note    01/19/2023  Patient ID: Michelle Hicks, female   DOB: February 21, 1950, 72 y.o.   MRN: [id number]     01/01/2023 01/19/2023  Outreach Medication One  Initial Outreach Date (Medication One) 01/01/2023 01/01/2023  Manufacturer Medication One [employer]  [employer] Ozempic Ozempic  Dose of Ozempic 2 mg   Type of Forensic scientist Assistance  Date Application Sent to Patient 01/01/2023 01/01/2023  Application Items Requested Application Application  Date Application Sent to Prescriber 01/01/2023 01/01/2023  Name of Prescriber Robyn Gillian Shields  Date Application Received From Patient 01/01/2023   Application Items Received From Patient Application Application  Date Application Received From Provider 01/01/2023 01/01/2023  Date Application Submitted to Manufacturer 01/01/2023 01/01/2023  Method Application Sent to Manufacturer Online Online  Patient Assistance Determination  Approved  Approval Start Date  02/14/2023  Approval End Date  02/13/2024  Patient Notification Method  MyChart       Catie Eppie Gibson, PharmD, BCACP, CPP Clinical Pharmacist [facility] [phone]

## 2023-01-19 NOTE — Patient Instructions (Signed)
Medication Instructions:  Your physician recommends that you continue on your current medications as directed. Please refer to the Current Medication list given to you today.  *If you need a refill on your cardiac medications before your next appointment, please call your pharmacy*  Lab Work: NONE  Testing/Procedures: NONE  Follow-Up: At [facility], you and your health needs are our priority.  As part of our continuing mission to provide you with exceptional heart care, we have created designated Provider Care Teams.  These Care Teams include your primary Cardiologist (physician) and Advanced Practice Providers (APPs -  Physician Assistants and Nurse Practitioners) who all work together to provide you with the care you need, when you need it.  We recommend signing up for the patient portal called "MyChart".  Sign up information is provided on this After Visit Summary.  MyChart is used to connect with patients for Virtual Visits (Telemedicine).  Patients are able to view lab/test results, encounter notes, upcoming appointments, etc.  Non-urgent messages can be sent to your provider as well.   To learn more about what you can do with MyChart, go to [email or web address].    Your next appointment:   12 month(s)  The format for your next appointment:   In Person  Provider:   Dr Duke Salvia or Eliezer Bottom NP   Right Start Program at [facility]  Sessions include Structured exercise sessions 2 group sessions per week for 9 weeks Monitored by fitness app 20 to 40-minute sessions Post program complications such as a neck step.  It is free for [facility] Members or $99 for non-members. Financial assistance is available.  No referral required.  Please call [phone], visit [employer] in person, or register online at [email or web address]

## 2023-01-21 ENCOUNTER — Other Ambulatory Visit: Payer: Self-pay | Admitting: Internal Medicine

## 2023-02-15 ENCOUNTER — Other Ambulatory Visit: Payer: Self-pay

## 2023-02-15 MED ORDER — TRAZODONE HCL 50 MG PO TABS: 50.0000 mg | ORAL_TABLET | Freq: Every day | ORAL | 1 refills | Status: DC

## 2023-02-20 DIAGNOSIS — Z79899 Other long term (current) drug therapy: Secondary | ICD-10-CM | POA: Diagnosis not present

## 2023-02-20 DIAGNOSIS — M609 Myositis, unspecified: Secondary | ICD-10-CM | POA: Diagnosis not present

## 2023-02-23 ENCOUNTER — Ambulatory Visit: Payer: Medicare Other | Admitting: Orthopaedic Surgery

## 2023-03-20 ENCOUNTER — Encounter: Payer: Self-pay | Admitting: Internal Medicine

## 2023-03-25 ENCOUNTER — Other Ambulatory Visit: Payer: Self-pay | Admitting: Internal Medicine

## 2023-04-05 ENCOUNTER — Encounter: Payer: Self-pay | Admitting: Internal Medicine

## 2023-04-05 ENCOUNTER — Telehealth: Payer: Medicare Other | Admitting: Internal Medicine

## 2023-04-05 DIAGNOSIS — E1169 Type 2 diabetes mellitus with other specified complication: Secondary | ICD-10-CM | POA: Diagnosis not present

## 2023-04-05 DIAGNOSIS — I119 Hypertensive heart disease without heart failure: Secondary | ICD-10-CM | POA: Diagnosis not present

## 2023-04-05 DIAGNOSIS — G4733 Obstructive sleep apnea (adult) (pediatric): Secondary | ICD-10-CM

## 2023-04-05 DIAGNOSIS — E785 Hyperlipidemia, unspecified: Secondary | ICD-10-CM

## 2023-04-05 DIAGNOSIS — E039 Hypothyroidism, unspecified: Secondary | ICD-10-CM

## 2023-04-05 DIAGNOSIS — G720 Drug-induced myopathy: Secondary | ICD-10-CM | POA: Diagnosis not present

## 2023-04-05 DIAGNOSIS — I700 Atherosclerosis of aorta: Secondary | ICD-10-CM

## 2023-04-05 DIAGNOSIS — T466X5A Adverse effect of antihyperlipidemic and antiarteriosclerotic drugs, initial encounter: Secondary | ICD-10-CM | POA: Diagnosis not present

## 2023-04-05 NOTE — Progress Notes (Signed)
 Virtual Visit via Video Note  I,Michelle Hicks, CMA,acting as a Neurosurgeon for Michelle Aliment, MD.,have documented all relevant documentation on the behalf of Michelle Aliment, MD,as directed by  Michelle Aliment, MD while in the presence of Michelle Aliment, MD.  I connected with Michelle Hicks on 04/07/23 at 11:20 AM EST by a video enabled telemedicine application and verified that I am speaking with the correct person using two identifiers.  Patient Location: Home Provider Location: Office/Clinic  I discussed the limitations, risks, security, and privacy concerns of performing an evaluation and management service by video and the availability of in person appointments. I also discussed with the patient that there may be a patient responsible charge related to this service. The patient expressed understanding and agreed to proceed.  Subjective: PCP: Michelle Peng, MD  Chief Complaint  Patient presents with   Diabetes   Hypertension   Patient presents virtually today for bp & dm & thyroid follow up. She reports compliance with medications. Denies headache, chest pain & sob.   She states her sugars are typically less than 100 before breakfast.  After eating, her sugars are typically in the 160s.  She states she had her last eye exam in December or January.  She was seen by Dr. Dione Hicks.  She has no other concerns at this time.   Diabetes She presents for her follow-up diabetic visit. She has type 2 diabetes mellitus. Her disease course has been stable. There are no hypoglycemic associated symptoms. There are no diabetic associated symptoms. Pertinent negatives for diabetes include no blurred vision, no polydipsia, no polyphagia and no polyuria. There are no hypoglycemic complications. Symptoms are stable. Risk factors for coronary artery disease include diabetes mellitus, obesity, sedentary lifestyle and post-menopausal. She is compliant with treatment most of the time. She is following a  generally healthy diet. She participates in exercise intermittently. An ACE inhibitor/angiotensin II receptor blocker is being taken. Eye exam is current.  Hypertension This is a chronic problem. The current episode started more than 1 year ago. The problem has been gradually improving since onset. The problem is controlled. Pertinent negatives include no blurred vision or shortness of breath. The current treatment provides moderate improvement.  Hyperlipidemia This is a chronic problem. The current episode started more than 1 year ago. Exacerbating diseases include obesity. Pertinent negatives include no shortness of breath. Current antihyperlipidemic treatment includes exercise and diet change. The current treatment provides moderate improvement of lipids. Compliance problems include adherence to exercise.  Risk factors for coronary artery disease include diabetes mellitus, dyslipidemia, hypertension, obesity and post-menopausal.     ROS: Per HPI  Current Outpatient Medications:    Accu-Chek Softclix Lancets lancets, USE TO CHECK BLOOD SUGAR TWICE DAILY, Disp: 200 each, Rfl: 3   amLODipine (NORVASC) 5 MG tablet, TAKE 1 TABLET(5 MG) BY MOUTH DAILY, Disp: 90 tablet, Rfl: 3   Blood Glucose Monitoring Suppl (ACCU-CHEK GUIDE) w/Device KIT, Use to check blood sugars twice daily E11.69, Disp: 1 kit, Rfl: 1   cyclobenzaprine (FLEXERIL) 10 MG tablet, Take 1 tablet (10 mg total) by mouth 3 (three) times daily as needed for muscle spasms., Disp: 30 tablet, Rfl: 0   diclofenac Sodium (VOLTAREN) 1 % GEL, Apply topically., Disp: , Rfl:    Evolocumab (REPATHA SURECLICK) 140 MG/ML SOAJ, Inject 140 mg into the skin every 14 (fourteen) days., Disp: 6 mL, Rfl: 3   fluticasone (FLONASE) 50 MCG/ACT nasal spray, Place 1 spray into both nostrils  daily., Disp: , Rfl:    folic acid (FOLVITE) 1 MG tablet, Take 1 mg by mouth daily., Disp: , Rfl:    gabapentin (NEURONTIN) 100 MG capsule, Take 1 capsule (100 mg total) by  mouth daily., Disp: 30 capsule, Rfl: 2   glucose blood (ACCU-CHEK GUIDE TEST) test strip, USE TO CHECK BLOOD SUGAR TWICE DAILY AS DIRECTED, Disp: 200 strip, Rfl: 1   hydrochlorothiazide (HYDRODIURIL) 25 MG tablet, TAKE 1 TABLET BY MOUTH DAILY, Disp: 100 tablet, Rfl: 2   hydroxychloroquine (PLAQUENIL) 200 MG tablet, Take 200 mg by mouth daily., Disp: , Rfl:    levothyroxine (SYNTHROID) 50 MCG tablet, Take 50 mcg by mouth daily. , Disp: , Rfl:    methotrexate (RHEUMATREX) 2.5 MG tablet, Take 10 mg by mouth once a week., Disp: , Rfl:    Multiple Vitamin (MULTIVITAMIN) tablet, Take 1 tablet by mouth daily., Disp: , Rfl:    predniSONE (DELTASONE) 5 MG tablet, Take 5 mg by mouth daily., Disp: , Rfl:    Semaglutide, 2 MG/DOSE, (OZEMPIC, 2 MG/DOSE,) 8 MG/3ML SOPN, Inject 2mg  sq once weekl, Disp: 9 mL, Rfl: 2   telmisartan (MICARDIS) 80 MG tablet, TAKE 1 TABLET BY MOUTH DAILY, Disp: 100 tablet, Rfl: 2   traZODone (DESYREL) 50 MG tablet, Take 1 tablet (50 mg total) by mouth at bedtime., Disp: 90 tablet, Rfl: 1   Vitamin D, Cholecalciferol, 25 MCG (1000 UT) CAPS, Take by mouth., Disp: , Rfl:   Current Facility-Administered Medications:    sodium chloride flush (NS) 0.9 % injection 3 mL, 3 mL, Intravenous, Q12H, Chilton Si, MD  Observations/Objective: There were no vitals filed for this visit. Physical Exam Vitals and nursing note reviewed.  Constitutional:      Appearance: Normal appearance.  HENT:     Head: Normocephalic and atraumatic.  Eyes:     Extraocular Movements: Extraocular movements intact.  Pulmonary:     Effort: Pulmonary effort is normal.  Musculoskeletal:     Cervical back: Normal range of motion.  Skin:    General: Skin is warm.  Neurological:     General: No focal deficit present.     Mental Status: She is alert.  Psychiatric:        Mood and Affect: Mood normal.        Behavior: Behavior normal.     Assessment and Plan: Dyslipidemia associated with type 2  diabetes mellitus (HCC) Assessment & Plan: Chronic, LDL goal is less than 70 - currently on Repatha due to statin myopathy. Regarding DM, she will continue with Ozempic 2mg  weekly. She agrees to rto next week for labwork.  She will f/u in 3-4 months.   Orders: -     CMP14+EGFR; Future -     Hemoglobin A1c; Future -     Lipid panel; Future  Hypertensive heart disease without heart failure Assessment & Plan: Chronic, she will continue with amlodipine 5mg  daily, telmisartan 80mg  daily and hydrochlorothiazide 25mg  daily. She is encouraged to follow low sodium diet.   Orders: -     CMP14+EGFR; Future -     Lipid panel; Future  Aortic atherosclerosis (HCC) Assessment & Plan: Chronic, not on statin therapy due to statin myopathy. LDL goal is less than 70.  She will continue with Repatha.    OSA (obstructive sleep apnea) Assessment & Plan: Chronic, she reports compliance with wearing CPAP at least four hours nightly and has noticed benefit from its use.    Statin myopathy Assessment & Plan: She has  been intolerant of multiple statins. She is now on Repatha.     Follow Up Instructions: Return lab visit - Tues 920.   I discussed the assessment and treatment plan with the patient. The patient was provided an opportunity to ask questions, and all were answered. The patient agreed with the plan and demonstrated an understanding of the instructions.   The patient was advised to call back or seek an in-person evaluation if the symptoms worsen or if the condition fails to improve as anticipated.  The above assessment and management plan was discussed with the patient. The patient verbalized understanding of and has agreed to the management plan.   I, Michelle Aliment, MD, have reviewed all documentation for this visit. The documentation on 04/07/23 for the exam, diagnosis, procedures, and orders are all accurate and complete.

## 2023-04-05 NOTE — Patient Instructions (Signed)
 Hypertension, Adult Hypertension is another name for high blood pressure. High blood pressure forces your heart to work harder to pump blood. This can cause problems over time. There are two numbers in a blood pressure reading. There is a top number (systolic) over a bottom number (diastolic). It is best to have a blood pressure that is below 120/80. What are the causes? The cause of this condition is not known. Some other conditions can lead to high blood pressure. What increases the risk? Some lifestyle factors can make you more likely to develop high blood pressure: Smoking. Not getting enough exercise or physical activity. Being overweight. Having too much fat, sugar, calories, or salt (sodium) in your diet. Drinking too much alcohol. Other risk factors include: Having any of these conditions: Heart disease. Diabetes. High cholesterol. Kidney disease. Obstructive sleep apnea. Having a family history of high blood pressure and high cholesterol. Age. The risk increases with age. Stress. What are the signs or symptoms? High blood pressure may not cause symptoms. Very high blood pressure (hypertensive crisis) may cause: Headache. Fast or uneven heartbeats (palpitations). Shortness of breath. Nosebleed. Vomiting or feeling like you may vomit (nauseous). Changes in how you see. Very bad chest pain. Feeling dizzy. Seizures. How is this treated? This condition is treated by making healthy lifestyle changes, such as: Eating healthy foods. Exercising more. Drinking less alcohol. Your doctor may prescribe medicine if lifestyle changes do not help enough and if: Your top number is above 130. Your bottom number is above 80. Your personal target blood pressure may vary. Follow these instructions at home: Eating and drinking  If told, follow the DASH eating plan. To follow this plan: Fill one half of your plate at each meal with fruits and vegetables. Fill one fourth of your plate  at each meal with whole grains. Whole grains include whole-wheat pasta, brown rice, and whole-grain bread. Eat or drink low-fat dairy products, such as skim milk or low-fat yogurt. Fill one fourth of your plate at each meal with low-fat (lean) proteins. Low-fat proteins include fish, chicken without skin, eggs, beans, and tofu. Avoid fatty meat, cured and processed meat, or chicken with skin. Avoid pre-made or processed food. Limit the amount of salt in your diet to less than 1,500 mg each day. Do not drink alcohol if: Your doctor tells you not to drink. You are pregnant, may be pregnant, or are planning to become pregnant. If you drink alcohol: Limit how much you have to: 0-1 drink a day for women. 0-2 drinks a day for men. Know how much alcohol is in your drink. In the [place] one drink equals one 12 oz bottle of beer (355 mL), one 5 oz glass of wine (148 mL), or one 1 oz glass of hard liquor (44 mL). Lifestyle  Work with your doctor to stay at a healthy weight or to lose weight. Ask your doctor what the best weight is for you. Get at least 30 minutes of exercise that causes your heart to beat faster (aerobic exercise) most days of the week. This may include walking, swimming, or biking. Get at least 30 minutes of exercise that strengthens your muscles (resistance exercise) at least 3 days a week. This may include lifting weights or doing Pilates. Do not smoke or use any products that contain nicotine or tobacco. If you need help quitting, ask your doctor. Check your blood pressure at home as told by your doctor. Keep all follow-up visits. Medicines Take over-the-counter and prescription medicines  only as told by your doctor. Follow directions carefully. Do not skip doses of blood pressure medicine. The medicine does not work as well if you skip doses. Skipping doses also puts you at risk for problems. Ask your doctor about side effects or reactions to medicines that you should watch  for. Contact a doctor if: You think you are having a reaction to the medicine you are taking. You have headaches that keep coming back. You feel dizzy. You have swelling in your ankles. You have trouble with your vision. Get help right away if: You get a very bad headache. You start to feel mixed up (confused). You feel weak or numb. You feel faint. You have very bad pain in your: Chest. Belly (abdomen). You vomit more than once. You have trouble breathing. These symptoms may be an emergency. Get help right away. Call 911. Do not wait to see if the symptoms will go away. Do not drive yourself to the hospital. Summary Hypertension is another name for high blood pressure. High blood pressure forces your heart to work harder to pump blood. For most people, a normal blood pressure is less than 120/80. Making healthy choices can help lower blood pressure. If your blood pressure does not get lower with healthy choices, you may need to take medicine. This information is not intended to replace advice given to you by your health care provider. Make sure you discuss any questions you have with your health care provider. Document Revised: 11/18/2020 Document Reviewed: 11/18/2020 Elsevier Patient Education  2024 [employer].

## 2023-04-07 NOTE — Assessment & Plan Note (Signed)
 Chronic, not on statin therapy due to statin myopathy. LDL goal is less than 70.  She will continue with Repatha.

## 2023-04-07 NOTE — Assessment & Plan Note (Signed)
 Chronic, she reports compliance with wearing CPAP at least four hours nightly and has noticed benefit from its use.

## 2023-04-07 NOTE — Assessment & Plan Note (Signed)
 She has been intolerant of multiple statins. She is now on Repatha.

## 2023-04-07 NOTE — Assessment & Plan Note (Signed)
 Chronic, she will continue with amlodipine 5mg  daily, telmisartan 80mg  daily and hydrochlorothiazide 25mg  daily. She is encouraged to follow low sodium diet.

## 2023-04-07 NOTE — Assessment & Plan Note (Signed)
 Chronic, LDL goal is less than 70 - currently on Repatha due to statin myopathy. Regarding DM, she will continue with Ozempic 2mg  weekly. She agrees to rto next week for labwork.  She will f/u in 3-4 months.

## 2023-04-09 ENCOUNTER — Ambulatory Visit: Payer: Self-pay

## 2023-04-09 NOTE — Patient Outreach (Signed)
 Care Coordination   Follow Up Visit Note   04/09/2023 Name: Michelle Hicks MRN: [id number] DOB: 1950-05-13  Michelle Hicks is a 73 y.o. year old female who sees Dorothyann Peng, MD for primary care. I spoke with  Michelle Hicks by phone today.  What matters to the patients health and wellness today?  Patient would like to be more active.     Goals Addressed               This Visit's Progress     Patient Stated     COMPLETED: I would like to have my abnormal labs evaluated (pt-stated)        Care Coordination Interventions: Evaluation of current treatment plan related to Myositis and patient's adherence to plan as established by provider Counseled on the importance of reporting any/all new or changed pain symptoms or management strategies to pain management provider Advised patient to report to care team affect of pain on daily activities Reviewed with patient prescribed pharmacological and nonpharmacological pain relief strategies Advised patient to discuss alternative therapies with provider      Other     COMPLETED: To continue to work on lowering A1c        Care Coordination Interventions: Evaluation of current treatment plan related to type 2 diabetes mellitus  and patient's adherence to plan as established by provider Discussed and reviewed recent virtual PCP visit, patient will go into the office tomorrow am to have her labs checked Reviewed with patient the details regarding patient assistance for Rapatha and Ozempic and when its time to renew Reviewed medications with patient and discussed importance of medication adherence Instructed patient to keep her PCP informed of new symptoms or concerns that may arise Discussed with patient closure from care coordination due to patient is managing her chronic health conditions and staying healthy  Lab Results  Component Value Date   HGBA1C 6.8 (H) 11/29/2022       COMPLETED: To get relief from low back pain        Care  Coordination Interventions: Counseled on the importance of exercise goals with target of 150 minutes per week  Instructed patient to keep her PCP informed of new symptoms or concerns that may arise Discussed with patient closure from care coordination due to patient is managing her chronic health conditions and staying healthy     Interventions Today    Flowsheet Row Most Recent Value  Chronic Disease   Chronic disease during today's visit Diabetes, Hypertension (HTN)  General Interventions   General Interventions Discussed/Reviewed General Interventions Discussed, General Interventions Reviewed, Doctor Visits, Durable Medical Equipment (DME), Labs  Doctor Visits Discussed/Reviewed Doctor Visits Reviewed, Doctor Visits Discussed, PCP  Durable Medical Equipment (DME) BP Cuff  Exercise Interventions   Exercise Discussed/Reviewed Physical Activity, Exercise Reviewed, Exercise Discussed  Physical Activity Discussed/Reviewed Physical Activity Reviewed, Physical Activity Discussed  Education Interventions   Education Provided Provided Education  Provided Verbal Education On Labs, Medication, Exercise, When to see the doctor  Pharmacy Interventions   Pharmacy Dicussed/Reviewed Pharmacy Topics Reviewed, Pharmacy Topics Discussed, Affording Medications          SDOH assessments and interventions completed:  Yes  SDOH Interventions Today    Flowsheet Row Most Recent Value  SDOH Interventions   Utilities Interventions Intervention Not Indicated        Care Coordination Interventions:  Yes, provided   Follow up plan: No further intervention required.   Encounter Outcome:  Patient Visit Completed

## 2023-04-09 NOTE — Patient Instructions (Signed)
 Visit Information  Thank you for taking time to visit with me today. Please don't hesitate to contact me if I can be of assistance to you.   Following are the goals we discussed today:   Goals Addressed               This Visit's Progress     Patient Stated     COMPLETED: I would like to have my abnormal labs evaluated (pt-stated)        Care Coordination Interventions: Evaluation of current treatment plan related to Myositis and patient's adherence to plan as established by provider Counseled on the importance of reporting any/all new or changed pain symptoms or management strategies to pain management provider Advised patient to report to care team affect of pain on daily activities Reviewed with patient prescribed pharmacological and nonpharmacological pain relief strategies Advised patient to discuss alternative therapies with provider      Other     COMPLETED: To continue to work on lowering A1c        Care Coordination Interventions: Evaluation of current treatment plan related to type 2 diabetes mellitus  and patient's adherence to plan as established by provider Discussed and reviewed recent virtual PCP visit, patient will go into the office tomorrow am to have her labs checked Reviewed with patient the details regarding patient assistance for Rapatha and Ozempic and when its time to renew Reviewed medications with patient and discussed importance of medication adherence Instructed patient to keep her PCP informed of new symptoms or concerns that may arise Discussed with patient closure from care coordination due to patient is managing her chronic health conditions and staying healthy  Lab Results  Component Value Date   HGBA1C 6.8 (H) 11/29/2022           COMPLETED: To get relief from low back pain        Care Coordination Interventions: Counseled on the importance of exercise goals with target of 150 minutes per week  Instructed patient to keep her PCP informed  of new symptoms or concerns that may arise Discussed with patient closure from care coordination due to patient is managing her chronic health conditions and staying healthy         If you are experiencing a Mental Health or Behavioral Health Crisis or need someone to talk to, please call 1-800-273-TALK (toll free, 24 hour hotline)  Patient verbalizes understanding of instructions and care plan provided today and agrees to view in MyChart. Active MyChart status and patient understanding of how to access instructions and care plan via MyChart confirmed with patient.     Delsa Sale RN BSN CCM [place]  [facility], [facility] Nurse Care Coordinator  Direct Dial: [phone] Website: [email or web address] '

## 2023-04-10 ENCOUNTER — Other Ambulatory Visit: Payer: Medicare Other

## 2023-04-10 DIAGNOSIS — E1169 Type 2 diabetes mellitus with other specified complication: Secondary | ICD-10-CM | POA: Diagnosis not present

## 2023-04-10 DIAGNOSIS — I119 Hypertensive heart disease without heart failure: Secondary | ICD-10-CM

## 2023-04-10 DIAGNOSIS — E785 Hyperlipidemia, unspecified: Secondary | ICD-10-CM | POA: Diagnosis not present

## 2023-04-11 LAB — CMP14+EGFR
ALT: 22 IU/L (ref 0–32)
AST: 24 IU/L (ref 0–40)
Albumin: 4.3 g/dL (ref 3.9–4.9)
Alkaline Phosphatase: 70 IU/L (ref 44–121)
BUN/Creatinine Ratio: 15 (ref 12–28)
BUN: 12 mg/dL (ref 8–27)
Bilirubin Total: 0.2 mg/dL (ref 0.0–1.2)
CO2: 21 mmol/L (ref 20–29)
Calcium: 9.7 mg/dL (ref 8.7–10.3)
Chloride: 101 mmol/L (ref 96–106)
Creatinine, Ser: 0.8 mg/dL (ref 0.57–1.00)
Globulin, Total: 2.7 g/dL (ref 1.5–4.5)
Glucose: 101 mg/dL — ABNORMAL HIGH (ref 70–99)
Potassium: 4.6 mmol/L (ref 3.5–5.2)
Sodium: 140 mmol/L (ref 134–144)
Total Protein: 7 g/dL (ref 6.0–8.5)
eGFR: 79 mL/min/{1.73_m2} (ref >59)

## 2023-04-11 LAB — LIPID PANEL
Chol/HDL Ratio: 1.9 ratio (ref 0.0–4.4)
Cholesterol, Total: 151 mg/dL (ref 100–199)
HDL: 80 mg/dL (ref >39)
LDL Chol Calc (NIH): 56 mg/dL (ref 0–99)
Triglycerides: 79 mg/dL (ref 0–149)
VLDL Cholesterol Cal: 15 mg/dL (ref 5–40)

## 2023-04-11 LAB — HEMOGLOBIN A1C
Est. average glucose Bld gHb Est-mCnc: 146 mg/dL
Hgb A1c MFr Bld: 6.7 % — ABNORMAL HIGH (ref 4.8–5.6)

## 2023-04-24 ENCOUNTER — Telehealth: Payer: Medicare Other | Admitting: Adult Health

## 2023-04-25 ENCOUNTER — Ambulatory Visit: Payer: Medicare Other | Admitting: Internal Medicine

## 2023-04-25 ENCOUNTER — Other Ambulatory Visit: Payer: Self-pay | Admitting: Internal Medicine

## 2023-04-25 ENCOUNTER — Ambulatory Visit (INDEPENDENT_AMBULATORY_CARE_PROVIDER_SITE_OTHER): Payer: Self-pay

## 2023-04-25 VITALS — BP 118/60 | HR 97 | Temp 97.9°F | Ht 64.0 in | Wt 239.2 lb

## 2023-04-25 DIAGNOSIS — Z0000 Encounter for general adult medical examination without abnormal findings: Secondary | ICD-10-CM

## 2023-04-25 DIAGNOSIS — Z1231 Encounter for screening mammogram for malignant neoplasm of breast: Secondary | ICD-10-CM

## 2023-04-25 NOTE — Patient Instructions (Signed)
 Michelle Hicks , Thank you for taking time to come for your Medicare Wellness Visit. I appreciate your ongoing commitment to your health goals. Please review the following plan we discussed and let me know if I can assist you in the future.   Referrals/Orders/Follow-Ups/Clinician Recommendations: requesting mammogram  This is a list of the screening recommended for you and due dates:  Health Maintenance  Topic Date Due   COVID-19 Vaccine (8 - 2024-25 season) 12/28/2022   Yearly kidney health urinalysis for diabetes  07/18/2023   Complete foot exam   07/18/2023   Hemoglobin A1C  10/08/2023   Mammogram  12/20/2023   Eye exam for diabetics  01/17/2024   Yearly kidney function blood test for diabetes  04/09/2024   Medicare Annual Wellness Visit  04/24/2024   Colon Cancer Screening  12/24/2029   DTaP/Tdap/Td vaccine (3 - Td or Tdap) 04/12/2032   Pneumonia Vaccine  Completed   Flu Shot  Completed   DEXA scan (bone density measurement)  Completed   Hepatitis C Screening  Completed   Zoster (Shingles) Vaccine  Completed   HPV Vaccine  Aged Out    Advanced directives: (Declined) Advance directive discussed with you today. Even though you declined this today, please call our office should you change your mind, and we can give you the proper paperwork for you to fill out.  Next Medicare Annual Wellness Visit scheduled for next year: Yes  insert Preventive Care attachment Insert FALL PREVENTION attachment if needed

## 2023-04-25 NOTE — Progress Notes (Signed)
 Subjective:   LURA FALOR is a 73 y.o. who presents for a Medicare Wellness preventive visit.  Visit Complete: In person    AWV Questionnaire: Yes: Patient Medicare AWV questionnaire was completed by the patient on 04/23/2023; I have confirmed that all information answered by patient is correct and no changes since this date.  Cardiac Risk Factors include: advanced age (>62men, >44 women);diabetes mellitus;dyslipidemia;hypertension     Objective:    Today's Vitals   04/25/23 1052  BP: 118/60  Pulse: 97  Temp: 97.9 F (36.6 C)  TempSrc: Oral  SpO2: 98%  Weight: 239 lb 3.2 oz (108.5 kg)  Height: 5\' 4"  (1.626 m)  PainSc: 3    Body mass index is 41.06 kg/m.     04/25/2023   10:58 AM 04/13/2022   10:32 AM 12/13/2021   11:06 AM 07/22/2021    7:47 AM 03/31/2021    9:54 AM 03/24/2020   10:00 AM 07/24/2019    2:01 PM  Advanced Directives  Does Patient Have a Medical Advance Directive? No No No No No No No  Would patient like information on creating a medical advance directive? No - Patient declined No - Patient declined No - Patient declined No - Patient declined Yes (MAU/Ambulatory/Procedural Areas - Information given) No - Patient declined --    Current Medications (verified) Outpatient Encounter Medications as of 04/25/2023  Medication Sig   Accu-Chek Softclix Lancets lancets USE TO CHECK BLOOD SUGAR TWICE DAILY   amLODipine (NORVASC) 5 MG tablet TAKE 1 TABLET(5 MG) BY MOUTH DAILY   Blood Glucose Monitoring Suppl (ACCU-CHEK GUIDE) w/Device KIT Use to check blood sugars twice daily E11.69   cyclobenzaprine (FLEXERIL) 10 MG tablet Take 1 tablet (10 mg total) by mouth 3 (three) times daily as needed for muscle spasms.   diclofenac Sodium (VOLTAREN) 1 % GEL Apply topically.   Evolocumab (REPATHA SURECLICK) 140 MG/ML SOAJ Inject 140 mg into the skin every 14 (fourteen) days.   fluticasone (FLONASE) 50 MCG/ACT nasal spray Place 1 spray into both nostrils daily.   folic acid  (FOLVITE) 1 MG tablet Take 1 mg by mouth daily.   gabapentin (NEURONTIN) 100 MG capsule Take 1 capsule (100 mg total) by mouth daily.   glucose blood (ACCU-CHEK GUIDE TEST) test strip USE TO CHECK BLOOD SUGAR TWICE DAILY AS DIRECTED   hydrochlorothiazide (HYDRODIURIL) 25 MG tablet TAKE 1 TABLET BY MOUTH DAILY   hydroxychloroquine (PLAQUENIL) 200 MG tablet Take 200 mg by mouth daily.   levothyroxine (SYNTHROID) 50 MCG tablet Take 50 mcg by mouth daily.    methotrexate (RHEUMATREX) 2.5 MG tablet Take 10 mg by mouth once a week.   Multiple Vitamin (MULTIVITAMIN) tablet Take 1 tablet by mouth daily.   predniSONE (DELTASONE) 5 MG tablet Take 5 mg by mouth daily.   Semaglutide, 2 MG/DOSE, (OZEMPIC, 2 MG/DOSE,) 8 MG/3ML SOPN Inject 2mg  sq once weekl   telmisartan (MICARDIS) 80 MG tablet TAKE 1 TABLET BY MOUTH DAILY   traZODone (DESYREL) 50 MG tablet Take 1 tablet (50 mg total) by mouth at bedtime.   Vitamin D, Cholecalciferol, 25 MCG (1000 UT) CAPS Take by mouth.   Facility-Administered Encounter Medications as of 04/25/2023  Medication   sodium chloride flush (NS) 0.9 % injection 3 mL    Allergies (verified) Statins, Sulfa antibiotics, and Sulfamethoxazole-trimethoprim   History: Past Medical History:  Diagnosis Date   Allergy 2018   Arthritis 2015   Depression    DM (diabetes mellitus) (HCC)  Exertional dyspnea 05/30/2021   GERD (gastroesophageal reflux disease) 2006   Heart murmur 1998   not addressed since   HTN (hypertension)    Neuromuscular disorder (HCC) 2022   Palpitations 01/19/2022   Pericardial calcification 07/12/2021   Sleep apnea    Tachycardia 05/30/2021   Past Surgical History:  Procedure Laterality Date   ABDOMINAL HYSTERECTOMY     RIGHT/LEFT HEART CATH AND CORONARY ANGIOGRAPHY N/A 07/22/2021   Procedure: RIGHT/LEFT HEART CATH AND CORONARY ANGIOGRAPHY;  Surgeon: Tonny Bollman, MD;  Location[facility] INVASIVE CV LAB;  Service: Cardiovascular;  Laterality: N/A;    TONSILLECTOMY Bilateral 1967   Family History  Problem Relation Age of Onset   Diabetes Mother    Pancreatic cancer Mother    Cancer Mother    Hypertension Father    Heart attack Father 50   Heart disease Father    Diabetes Maternal Grandmother    Arthritis Paternal Aunt    Arthritis Paternal Aunt    Breast cancer Neg Hx    Social History   Socioeconomic History   Marital status: Married    Spouse name: Not on file   Number of children: Not on file   Years of education: Not on file   Highest education level: Bachelor's degree (e.g., BA, AB, BS)  Occupational History   Occupation: retired  Tobacco Use   Smoking status: Former    Current packs/day: 0.00    Average packs/day: 0.8 packs/day for 20.0 years (15.0 ttl pk-yrs)    Types: Cigarettes    Start date: 75    Quit date: 2002    Years since quitting: 23.2    Passive exposure: Past   Smokeless tobacco: Never  Vaping Use   Vaping status: Never Used  Substance and Sexual Activity   Alcohol use: Not Currently    Comment: not often; not weekly, monthly or yearly sometime   Drug use: No   Sexual activity: Yes    Partners: Male    Birth control/protection: Surgical  Other Topics Concern   Not on file  Social History Narrative   Not on file   Social Drivers of Health   Financial Resource Strain: Low Risk  (04/25/2023)   Overall Financial Resource Strain (CARDIA)    Difficulty of Paying Living Expenses: Not hard at all  Food Insecurity: No Food Insecurity (04/25/2023)   Hunger Vital Sign    Worried About Running Out of Food in the Last Year: Never true    Ran Out of Food in the Last Year: Never true  Transportation Needs: No Transportation Needs (04/25/2023)   PRAPARE - Administrator, Civil Service (Medical): No    Lack of Transportation (Non-Medical): No  Physical Activity: Insufficiently Active (04/25/2023)   Exercise Vital Sign    Days of Exercise per Week: 7 days    Minutes of Exercise per  Session: 20 min  Stress: No Stress Concern Present (04/25/2023)   [employer] of Occupational Health - Occupational Stress Questionnaire    Feeling of Stress : Only a little  Social Connections: Moderately Integrated (04/25/2023)   Social Connection and Isolation Panel [NHANES]    Frequency of Communication with Friends and Family: Three times a week    Frequency of Social Gatherings with Friends and Family: Once a week    Attends Religious Services: More than 4 times per year    Active Member of [employer] or Organizations: No    Attends Banker Meetings: Never  Marital Status: Married    Tobacco Counseling Counseling given: Not Answered    Clinical Intake:  Pre-visit preparation completed: Yes  Pain : 0-10 Pain Score: 3  Pain Type: Chronic pain Pain Location: Back Pain Orientation: Right, Lower Pain Descriptors / Indicators: Dull Pain Onset: More than a month ago Pain Frequency: Constant     Nutritional Status: BMI > 30  Obese Nutritional Risks: None Diabetes: Yes CBG done?: No Did pt. bring in CBG monitor from home?: No  How often do you need to have someone help you when you read instructions, pamphlets, or other written materials from your doctor or pharmacy?: 1 - Never  Interpreter Needed?: No  Information entered by :: NAllen LPN   Activities of Daily Living     04/23/2023   12:05 PM  In your present state of health, do you have any difficulty performing the following activities:  Hearing? 0  Vision? 0  Difficulty concentrating or making decisions? 0  Walking or climbing stairs? 0  Dressing or bathing? 0  Doing errands, shopping? 0  Preparing Food and eating ? N  Using the Toilet? N  In the past six months, have you accidently leaked urine? N  Do you have problems with loss of bowel control? N  Managing your Medications? N  Managing your Finances? N  Housekeeping or managing your Housekeeping? N    Patient Care Team: Dorothyann Peng, MD as PCP - General (Internal Medicine) Chilton Si, MD as PCP - Cardiology (Cardiology) Alden Hipp, RPH-CPP (Pharmacist)  Indicate any recent Medical Services you may have received from other than [facility] providers in the past year (date may be approximate).     Assessment:   This is a routine wellness examination for Rupal.  Hearing/Vision screen Hearing Screening - Comments:: Denies hearing issues Vision Screening - Comments:: Regular eye exams, [facility]   Goals Addressed             This Visit's Progress    Patient Stated       04/25/2023, wants to lose weight       Depression Screen     04/25/2023   10:59 AM 07/18/2022   10:55 AM 06/13/2022    3:58 PM 04/13/2022   10:34 AM 03/31/2021    9:56 AM 03/24/2020   10:01 AM 07/10/2019   10:14 AM  PHQ 2/9 Scores  PHQ - 2 Score 3 0 0 0 0 0 0  PHQ- 9 Score 4 6 0    3    Fall Risk     04/23/2023   12:05 PM 11/29/2022    9:17 AM 07/18/2022   10:55 AM 06/13/2022    3:57 PM 04/13/2022   10:33 AM  Fall Risk   Falls in the past year? 0 0 1 0 1  Comment     not really sure what happened  Number falls in past yr: 0 0 0 0 0  Injury with Fall? 0 0 0 0 0  Risk for fall due to : Medication side effect No Fall Risks History of fall(s) No Fall Risks Medication side effect;Impaired balance/gait  Follow up Falls prevention discussed;Falls evaluation completed Falls evaluation completed Falls evaluation completed Falls evaluation completed Falls prevention discussed;Education provided;Falls evaluation completed    MEDICARE RISK AT HOME:  Medicare Risk at Home Any stairs in or around the home?: (Patient-Rptd) No If so, are there any without handrails?: (Patient-Rptd) No Home free of loose throw rugs in walkways, pet  beds, electrical cords, etc?: (Patient-Rptd) Yes Adequate lighting in your home to reduce risk of falls?: (Patient-Rptd) Yes Life alert?: (Patient-Rptd) No Use of a cane, walker or w/c?: (Patient-Rptd)  No Grab bars in the bathroom?: (Patient-Rptd) No Shower chair or bench in shower?: (Patient-Rptd) No Elevated toilet seat or a handicapped toilet?: (Patient-Rptd) No  TIMED UP AND GO:  Was the test performed?  Yes  Length of time to ambulate 10 feet: 5 sec Gait steady and fast without use of assistive device  Cognitive Function: 6CIT completed        04/25/2023   11:01 AM 04/13/2022   10:36 AM 03/31/2021    9:56 AM 03/24/2020   10:03 AM 07/10/2019   10:17 AM  6CIT Screen  What Year? 0 points 0 points 0 points 0 points 0 points  What month? 0 points 0 points 0 points 0 points 0 points  What time? 0 points 0 points 0 points 0 points 0 points  Count back from 20 0 points 0 points 0 points 0 points 0 points  Months in reverse 0 points 0 points 0 points 0 points 0 points  Repeat phrase 0 points 4 points 2 points 4 points 0 points  Total Score 0 points 4 points 2 points 4 points 0 points    Immunizations Immunization History  Administered Date(s) Administered   DTaP 02/28/2012   Fluad Quad(high Dose 65+) 11/07/2019, 11/02/2021   Influenza, High Dose Seasonal PF 11/14/2017, 10/30/2018, 11/20/2022   Influenza-Unspecified 11/14/2017, 10/30/2018, 11/10/2020   Moderna Covid-19 Vaccine Bivalent Booster 2yrs & up 11/10/2020, 11/02/2022   Moderna Sars-Covid-2 Vaccination 03/28/2019, 04/25/2019, 02/03/2020, 07/04/2020, 11/08/2021   Pneumococcal Conjugate-13 04/12/2018   Pneumococcal Polysaccharide-23 12/30/2020   Pneumococcal-Unspecified 08/06/2013   Tdap 04/13/2022   Zoster Recombinant(Shingrix) 11/14/2018, 07/29/2019    Screening Tests Health Maintenance  Topic Date Due   COVID-19 Vaccine (8 - 2024-25 season) 12/28/2022   Diabetic kidney evaluation - Urine ACR  07/18/2023   FOOT EXAM  07/18/2023   HEMOGLOBIN A1C  10/08/2023   MAMMOGRAM  12/20/2023   OPHTHALMOLOGY EXAM  01/17/2024   Diabetic kidney evaluation - eGFR measurement  04/09/2024   Medicare Annual Wellness (AWV)   04/24/2024   Colonoscopy  12/24/2029   DTaP/Tdap/Td (3 - Td or Tdap) 04/12/2032   Pneumonia Vaccine 74+ Years old  Completed   INFLUENZA VACCINE  Completed   DEXA SCAN  Completed   Hepatitis C Screening  Completed   Zoster Vaccines- Shingrix  Completed   HPV VACCINES  Aged Out    Health Maintenance  Health Maintenance Due  Topic Date Due   COVID-19 Vaccine (8 - 2024-25 season) 12/28/2022   Health Maintenance Items Addressed: Requested report for mammogram  Additional Screening:  Vision Screening: Recommended annual ophthalmology exams for early detection of glaucoma and other disorders of the eye.  Dental Screening: Recommended annual dental exams for proper oral hygiene  Community Resource Referral / Chronic Care Management: CRR required this visit?  No   CCM required this visit?  No     Plan:     I have personally reviewed and noted the following in the patient's chart:   Medical and social history Use of alcohol, tobacco or illicit drugs  Current medications and supplements including opioid prescriptions. Patient is not currently taking opioid prescriptions. Functional ability and status Nutritional status Physical activity Advanced directives List of other physicians Hospitalizations, surgeries, and ER visits in previous 12 months Vitals Screenings to include cognitive, depression, and falls  Referrals and appointments  In addition, I have reviewed and discussed with patient certain preventive protocols, quality metrics, and best practice recommendations. A written personalized care plan for preventive services as well as general preventive health recommendations were provided to patient.     Barb Merino, LPN   1/61/0960   After Visit Summary: (In Person-Printed) AVS printed and given to the patient  Notes: Nothing significant to report at this time.

## 2023-05-17 ENCOUNTER — Other Ambulatory Visit (HOSPITAL_BASED_OUTPATIENT_CLINIC_OR_DEPARTMENT_OTHER): Payer: Self-pay | Admitting: Cardiovascular Disease

## 2023-05-17 ENCOUNTER — Ambulatory Visit
Admission: RE | Admit: 2023-05-17 | Discharge: 2023-05-17 | Disposition: A | Discharge status: Home / Self Care | Source: Ambulatory Visit | Attending: Internal Medicine | Admitting: Internal Medicine

## 2023-05-17 DIAGNOSIS — Z1231 Encounter for screening mammogram for malignant neoplasm of breast: Secondary | ICD-10-CM | POA: Diagnosis not present

## 2023-05-30 DIAGNOSIS — I10 Essential (primary) hypertension: Secondary | ICD-10-CM | POA: Diagnosis not present

## 2023-05-30 DIAGNOSIS — E1165 Type 2 diabetes mellitus with hyperglycemia: Secondary | ICD-10-CM | POA: Diagnosis not present

## 2023-05-30 DIAGNOSIS — Z780 Asymptomatic menopausal state: Secondary | ICD-10-CM | POA: Diagnosis not present

## 2023-05-30 DIAGNOSIS — E049 Nontoxic goiter, unspecified: Secondary | ICD-10-CM | POA: Diagnosis not present

## 2023-05-30 DIAGNOSIS — E7800 Pure hypercholesterolemia, unspecified: Secondary | ICD-10-CM | POA: Diagnosis not present

## 2023-05-30 DIAGNOSIS — E039 Hypothyroidism, unspecified: Secondary | ICD-10-CM | POA: Diagnosis not present

## 2023-05-30 DIAGNOSIS — E042 Nontoxic multinodular goiter: Secondary | ICD-10-CM | POA: Diagnosis not present

## 2023-06-07 DIAGNOSIS — M609 Myositis, unspecified: Secondary | ICD-10-CM | POA: Diagnosis not present

## 2023-06-07 DIAGNOSIS — Z79899 Other long term (current) drug therapy: Secondary | ICD-10-CM | POA: Diagnosis not present

## 2023-06-25 DIAGNOSIS — D649 Anemia, unspecified: Secondary | ICD-10-CM | POA: Diagnosis not present

## 2023-06-25 DIAGNOSIS — D472 Monoclonal gammopathy: Secondary | ICD-10-CM | POA: Diagnosis not present

## 2023-06-25 LAB — HEPATIC FUNCTION PANEL
ALT: 33 U/L (ref 7–35)
AST: 30 (ref 13–35)
Alkaline Phosphatase: 57 (ref 25–125)

## 2023-06-25 LAB — CBC AND DIFFERENTIAL
HCT: 33 — AB (ref 36–46)
Hemoglobin: 11.2 — AB (ref 12.0–16.0)
WBC: 7.6

## 2023-06-25 LAB — CBC: RBC: 3.75 — AB (ref 3.87–5.11)

## 2023-06-25 LAB — BASIC METABOLIC PANEL WITH GFR
BUN: 11 (ref 4–21)
CO2: 26 — AB (ref 13–22)
Chloride: 105 (ref 99–108)
Creatinine: 0.9 (ref 0.5–1.1)
Potassium: 4 meq/L (ref 3.5–5.1)
Sodium: 139 (ref 137–147)

## 2023-06-25 LAB — COMPREHENSIVE METABOLIC PANEL WITH GFR
Albumin: 4.4 (ref 3.5–5.0)
Calcium: 10 (ref 8.7–10.7)
eGFR: 71

## 2023-06-28 DIAGNOSIS — K649 Unspecified hemorrhoids: Secondary | ICD-10-CM | POA: Diagnosis not present

## 2023-06-28 DIAGNOSIS — K5900 Constipation, unspecified: Secondary | ICD-10-CM | POA: Diagnosis not present

## 2023-06-29 DIAGNOSIS — M5417 Radiculopathy, lumbosacral region: Secondary | ICD-10-CM | POA: Diagnosis not present

## 2023-07-18 ENCOUNTER — Ambulatory Visit: Payer: Medicare Other | Admitting: Internal Medicine

## 2023-07-19 ENCOUNTER — Telehealth: Payer: Self-pay | Admitting: Adult Health

## 2023-07-19 NOTE — Telephone Encounter (Signed)
 Appointment made

## 2023-07-23 ENCOUNTER — Ambulatory Visit: Admitting: Adult Health

## 2023-07-23 ENCOUNTER — Encounter: Payer: Self-pay | Admitting: Adult Health

## 2023-07-23 VITALS — BP 119/59 | HR 95 | Ht 64.0 in | Wt 239.0 lb

## 2023-07-23 DIAGNOSIS — G4733 Obstructive sleep apnea (adult) (pediatric): Secondary | ICD-10-CM | POA: Diagnosis not present

## 2023-07-23 NOTE — Progress Notes (Signed)
 PATIENT: Michelle Hicks DOB: [birth year]  REASON FOR VISIT: follow up HISTORY FROM: patient PRIMARY NEUROLOGIST: Dr. Albertina Hugger   Chief Complaint  Patient presents with   Follow-up    Pt in 5 alone Pt here for cpap f/u Pt states no question or concerns for today's visit      HISTORY OF PRESENT ILLNESS: Today 07/23/23:  Michelle Hicks is a 73 y.o. female with a history of OSA on CPAP . Returns today for follow-up.  Reports that she uses the CPAP consistently.  Reports that she is not sure how beneficial it is.  States that she uses the machine because we "told her to."  She denies any new issues.  Her download is below       REVIEW OF SYSTEMS: Out of a complete 14 system review of symptoms, the patient complains only of the following symptoms, and all other reviewed systems are negative.   ALLERGIES: Allergies  Allergen Reactions   Statins     CK elevation   Sulfa Antibiotics Rash   Sulfamethoxazole-Trimethoprim Rash    HOME MEDICATIONS: Outpatient Medications Prior to Visit  Medication Sig Dispense Refill   Accu-Chek Softclix Lancets lancets USE TO CHECK BLOOD SUGAR TWICE DAILY 200 each 3   amLODipine  (NORVASC ) 5 MG tablet TAKE 1 TABLET(5 MG) BY MOUTH DAILY 90 tablet 3   Blood Glucose Monitoring Suppl (ACCU-CHEK GUIDE) w/Device KIT Use to check blood sugars twice daily E11.69 1 kit 1   cyclobenzaprine  (FLEXERIL ) 10 MG tablet Take 1 tablet (10 mg total) by mouth 3 (three) times daily as needed for muscle spasms. 30 tablet 0   diclofenac Sodium (VOLTAREN) 1 % GEL Apply topically.     Evolocumab  (REPATHA  SURECLICK) 140 MG/ML SOAJ Inject 140 mg into the skin every 14 (fourteen) days. 6 mL 3   fluticasone (FLONASE) 50 MCG/ACT nasal spray Place 1 spray into both nostrils daily.     folic acid  (FOLVITE ) 1 MG tablet Take 1 mg by mouth daily.     gabapentin  (NEURONTIN ) 100 MG capsule Take 1 capsule (100 mg total) by mouth daily. (Patient taking differently: Take 100 mg by  mouth as needed.) 30 capsule 2   glucose blood (ACCU-CHEK GUIDE TEST) test strip USE TO CHECK BLOOD SUGAR TWICE DAILY AS DIRECTED 200 strip 1   hydrochlorothiazide  (HYDRODIURIL ) 25 MG tablet TAKE 1 TABLET BY MOUTH DAILY 100 tablet 2   hydroxychloroquine (PLAQUENIL) 200 MG tablet Take 200 mg by mouth daily.     levothyroxine (SYNTHROID) 50 MCG tablet Take 50 mcg by mouth daily.      LINZESS 145 MCG CAPS capsule Take 145 mcg by mouth every other day.     methotrexate (RHEUMATREX) 2.5 MG tablet Take 10 mg by mouth once a week.     Multiple Vitamin (MULTIVITAMIN) tablet Take 1 tablet by mouth daily.     predniSONE (DELTASONE) 5 MG tablet Take 5 mg by mouth daily.     Semaglutide , 2 MG/DOSE, (OZEMPIC , 2 MG/DOSE,) 8 MG/3ML SOPN Inject 2mg  sq once weekl 9 mL 2   telmisartan  (MICARDIS ) 80 MG tablet TAKE 1 TABLET BY MOUTH DAILY 100 tablet 2   traZODone  (DESYREL ) 50 MG tablet Take 1 tablet (50 mg total) by mouth at bedtime. 90 tablet 1   Vitamin D, Cholecalciferol, 25 MCG (1000 UT) CAPS Take by mouth.     Facility-Administered Medications Prior to Visit  Medication Dose Route Frequency Provider Last Rate Last Admin   sodium chloride  flush (  NS) 0.9 % injection 3 mL  3 mL Intravenous Q12H Maudine Sos, MD        PAST MEDICAL HISTORY: Past Medical History:  Diagnosis Date   Allergy 2018   Arthritis 2015   Depression    DM (diabetes mellitus) (HCC)    Exertional dyspnea 05/30/2021   GERD (gastroesophageal reflux disease) 2006   Heart murmur 1998   not addressed since   HTN (hypertension)    Neuromuscular disorder (HCC) 2022   Palpitations 01/19/2022   Pericardial calcification 07/12/2021   Sleep apnea    Tachycardia 05/30/2021    PAST SURGICAL HISTORY: Past Surgical History:  Procedure Laterality Date   ABDOMINAL HYSTERECTOMY     RIGHT/LEFT HEART CATH AND CORONARY ANGIOGRAPHY N/A 07/22/2021   Procedure: RIGHT/LEFT HEART CATH AND CORONARY ANGIOGRAPHY;  Surgeon: Arnoldo Lapping, MD;   Location: [facility] INVASIVE CV LAB;  Service: Cardiovascular;  Laterality: N/A;   TONSILLECTOMY Bilateral 1967    FAMILY HISTORY: Family History  Problem Relation Age of Onset   Diabetes Mother    Pancreatic cancer Mother    Cancer Mother    Hypertension Father    Heart attack Father 81   Heart disease Father    Arthritis Paternal Aunt    Arthritis Paternal Aunt    Diabetes Maternal Grandmother    Breast cancer Neg Hx    Sleep apnea Neg Hx     SOCIAL HISTORY: Social History   Socioeconomic History   Marital status: Married    Spouse name: Not on file   Number of children: Not on file   Years of education: Not on file   Highest education level: Bachelor's degree (e.g., BA, AB, BS)  Occupational History   Occupation: retired  Tobacco Use   Smoking status: Former    Current packs/day: 0.00    Average packs/day: 0.8 packs/day for 20.0 years (15.0 ttl pk-yrs)    Types: Cigarettes    Start date: 51    Quit date: 2002    Years since quitting: 23.4    Passive exposure: Past   Smokeless tobacco: Never  Vaping Use   Vaping status: Never Used  Substance and Sexual Activity   Alcohol use: Not Currently    Comment: not often; not weekly, monthly or yearly sometime   Drug use: No   Sexual activity: Yes    Partners: Male    Birth control/protection: Surgical  Other Topics Concern   Not on file  Social History Narrative   Pt lives with family    Retired    Chief Executive Officer Drivers of Corporate investment banker Strain: Low Risk  (04/25/2023)   Overall Financial Resource Strain (CARDIA)    Difficulty of Paying Living Expenses: Not hard at all  Food Insecurity: No Food Insecurity (04/25/2023)   Hunger Vital Sign    Worried About Running Out of Food in the Last Year: Never true    Ran Out of Food in the Last Year: Never true  Transportation Needs: No Transportation Needs (04/25/2023)   PRAPARE - Administrator, Civil Service (Medical): No    Lack of Transportation  (Non-Medical): No  Physical Activity: Insufficiently Active (04/25/2023)   Exercise Vital Sign    Days of Exercise per Week: 7 days    Minutes of Exercise per Session: 20 min  Stress: No Stress Concern Present (04/25/2023)   [employer] of Occupational Health - Occupational Stress Questionnaire    Feeling of Stress : Only a little  Social  Connections: Moderately Integrated (04/25/2023)   Social Connection and Isolation Panel [NHANES]    Frequency of Communication with Friends and Family: Three times a week    Frequency of Social Gatherings with Friends and Family: Once a week    Attends Religious Services: More than 4 times per year    Active Member of [employer] or Organizations: No    Attends Banker Meetings: Never    Marital Status: Married  Catering manager Violence: Not At Risk (04/25/2023)   Humiliation, Afraid, Rape, and Kick questionnaire    Fear of Current or Ex-Partner: No    Emotionally Abused: No    Physically Abused: No    Sexually Abused: No      PHYSICAL EXAM  Vitals:   07/23/23 1357  BP: (!) 119/59  Pulse: 95  Weight: 239 lb (108.4 kg)  Height: 5\' 4"  (1.626 m)   Body mass index is 41.02 kg/m.  Generalized: Well developed, in no acute distress  Chest: Lungs clear to auscultation bilaterally  Neurological examination  Mentation: Alert oriented to time, place, history taking. Follows all commands speech and language fluent Cranial nerve II-XII: Facial symmetry noted   DIAGNOSTIC DATA (LABS, IMAGING, TESTING) - I reviewed patient records, labs, notes, testing and imaging myself where available.  Lab Results  Component Value Date   WBC 6.3 06/13/2022   HGB 11.5 06/13/2022   HCT 34.5 06/13/2022   MCV 84 06/13/2022   PLT 281 06/13/2022      Component Value Date/Time   NA 140 04/10/2023 0904   K 4.6 04/10/2023 0904   CL 101 04/10/2023 0904   CO2 21 04/10/2023 0904   GLUCOSE 101 (H) 04/10/2023 0904   GLUCOSE 98 10/13/2009 1956   BUN  12 04/10/2023 0904   CREATININE 0.80 04/10/2023 0904   CALCIUM 9.7 04/10/2023 0904   PROT 7.0 04/10/2023 0904   ALBUMIN 4.3 04/10/2023 0904   AST 24 04/10/2023 0904   ALT 22 04/10/2023 0904   ALKPHOS 70 04/10/2023 0904   BILITOT 0.2 04/10/2023 0904   GFRNONAA 73 03/24/2020 1034   GFRAA 84 03/24/2020 1034   Lab Results  Component Value Date   CHOL 151 04/10/2023   HDL 80 04/10/2023   LDLCALC 56 04/10/2023   TRIG 79 04/10/2023   CHOLHDL 1.9 04/10/2023   Lab Results  Component Value Date   HGBA1C 6.7 (H) 04/10/2023   Lab Results  Component Value Date   VITAMINB12 >2000 (H) 07/18/2022   Lab Results  Component Value Date   TSH 2.140 11/29/2022      ASSESSMENT AND PLAN 73 y.o. year old female  has a past medical history of Allergy (2018), Arthritis (2015), Depression, DM (diabetes mellitus) (HCC), Exertional dyspnea (05/30/2021), GERD (gastroesophageal reflux disease) (2006), Heart murmur (1998), HTN (hypertension), Neuromuscular disorder (HCC) (2022), Palpitations (01/19/2022), Pericardial calcification (07/12/2021), Sleep apnea, and Tachycardia (05/30/2021). here with:  OSA on CPAP  - CPAP compliance excellent - Good treatment of AHI  - Encourage patient to use CPAP nightly and > 4 hours each night - Patient did ask about potential risk for infection if the machine is not clean consistently.  I did advise that she should clean her machine according to the owner's manual and change out her supplies as recommended by her DME company. - F/U in 1 year or sooner if needed     Clem Currier, MSN, NP-C 07/23/2023, 2:23 PM [facility] [address], [address] [place], [place] [address] [phone]

## 2023-07-23 NOTE — Patient Instructions (Signed)
Your Plan:  Continue using CPAP nightly and greater than 4 hours each night If your symptoms worsen or you develop new symptoms please let us know.       Thank you for coming to see us at [facility]. I hope we have been able to provide you high quality care today.  You may receive a patient satisfaction survey over the next few weeks. We would appreciate your feedback and comments so that we may continue to improve ourselves and the health of our patients.  

## 2023-07-24 ENCOUNTER — Ambulatory Visit (INDEPENDENT_AMBULATORY_CARE_PROVIDER_SITE_OTHER): Payer: Self-pay | Admitting: Internal Medicine

## 2023-07-24 ENCOUNTER — Encounter: Payer: Self-pay | Admitting: Internal Medicine

## 2023-07-24 ENCOUNTER — Ambulatory Visit: Payer: Self-pay | Admitting: Internal Medicine

## 2023-07-24 VITALS — BP 128/60 | HR 76 | Temp 98.6°F | Ht 64.0 in | Wt 240.0 lb

## 2023-07-24 DIAGNOSIS — Z0000 Encounter for general adult medical examination without abnormal findings: Secondary | ICD-10-CM

## 2023-07-24 DIAGNOSIS — T466X5A Adverse effect of antihyperlipidemic and antiarteriosclerotic drugs, initial encounter: Secondary | ICD-10-CM | POA: Diagnosis not present

## 2023-07-24 DIAGNOSIS — E785 Hyperlipidemia, unspecified: Secondary | ICD-10-CM | POA: Diagnosis not present

## 2023-07-24 DIAGNOSIS — E1169 Type 2 diabetes mellitus with other specified complication: Secondary | ICD-10-CM | POA: Diagnosis not present

## 2023-07-24 DIAGNOSIS — D649 Anemia, unspecified: Secondary | ICD-10-CM | POA: Diagnosis not present

## 2023-07-24 DIAGNOSIS — E66813 Obesity, class 3: Secondary | ICD-10-CM

## 2023-07-24 DIAGNOSIS — M48062 Spinal stenosis, lumbar region with neurogenic claudication: Secondary | ICD-10-CM

## 2023-07-24 DIAGNOSIS — Z23 Encounter for immunization: Secondary | ICD-10-CM

## 2023-07-24 DIAGNOSIS — I119 Hypertensive heart disease without heart failure: Secondary | ICD-10-CM

## 2023-07-24 DIAGNOSIS — G720 Drug-induced myopathy: Secondary | ICD-10-CM

## 2023-07-24 DIAGNOSIS — I700 Atherosclerosis of aorta: Secondary | ICD-10-CM | POA: Diagnosis not present

## 2023-07-24 DIAGNOSIS — G4733 Obstructive sleep apnea (adult) (pediatric): Secondary | ICD-10-CM

## 2023-07-24 DIAGNOSIS — E039 Hypothyroidism, unspecified: Secondary | ICD-10-CM

## 2023-07-24 DIAGNOSIS — Z6841 Body Mass Index (BMI) 40.0 and over, adult: Secondary | ICD-10-CM

## 2023-07-24 LAB — POCT URINALYSIS DIP (CLINITEK)
Bilirubin, UA: NEGATIVE
Blood, UA: NEGATIVE
Glucose, UA: NEGATIVE mg/dL
Ketones, POC UA: NEGATIVE mg/dL
Nitrite, UA: NEGATIVE
POC PROTEIN,UA: NEGATIVE
Spec Grav, UA: >=1.03 — AB (ref 1.010–1.025)
Urobilinogen, UA: 0.2 U/dL
pH, UA: 6 (ref 5.0–8.0)

## 2023-07-24 NOTE — Assessment & Plan Note (Addendum)
 Chronic, most recent MRI results reviewed. Chronic pain affects balance and radiates to hip. Gabapentin  may reduce pain and reliance on acetaminophen . Water exercises discussed. - Consider more consistent use of gabapentin . - Set up appointment with orthopedic specialist. - Consider water exercises.

## 2023-07-24 NOTE — Assessment & Plan Note (Signed)
 Chronic, currently stable. On levothroxine 50mcg daily.  - I will check thyroid  panel and adjust meds as needed.

## 2023-07-24 NOTE — Progress Notes (Signed)
 I,Jameka J Llittleton, CMA,acting as a Neurosurgeon for Catheryn LOISE Slocumb, MD.,have documented all relevant documentation on the behalf of Catheryn LOISE Slocumb, MD,as directed by  Catheryn LOISE Slocumb, MD while in the presence of Catheryn LOISE Slocumb, MD.  Subjective:    Patient ID: Michelle Hicks , female    DOB: [birth year] , 73 y.o.   MRN: [id number]  Chief Complaint  Patient presents with   Annual Exam    Patient presents today for HM, Patient reports compliance with medication. Patient denies any chest pain, SOB, or headaches. Patient has no concerns today.    Diabetes   Hypertension    HPI Discussed the use of AI scribe software for clinical note transcription with the patient, who gave verbal consent to proceed.  History of Present Illness Michelle Hicks is a 73 year old female with diabetes who presents for a physical and diabetes check.  Her blood sugar levels are generally well-controlled, with morning readings usually below 100 mg/dL. She engages in minimal exercise, primarily stretching, due to balance and back issues.  She has a history of back problems, which she believes are causing symptoms in her hip and affecting her walking and balance. She recently underwent an EMG. She has consulted  Ortho in the past and is considering further evaluation. She does h/o spinal stenosis.   Her CPK levels remain elevated at around 400 U/L, which is outside the normal range but may be her baseline. She is scheduled to see her rheumatologist at the end of next month.  She takes several medications including trazodone  as needed for sleep, telmisartan  80 mg, Ozempic  2 mg weekly, prednisone 5 mg every other day, methotrexate weekly, Linzess, levothyroxine 50 mcg daily, hydroxychloroquine, hydrochlorothiazide , gabapentin  as needed, Repatha , and amlodipine  5 mg. She takes her blood pressure medications in the morning and levothyroxine daily.  Diabetes She presents for her follow-up diabetic visit. She has type 2  diabetes mellitus. Her disease course has been stable. There are no hypoglycemic associated symptoms. Pertinent negatives for diabetes include no blurred vision. There are no hypoglycemic complications. Symptoms are stable. Risk factors for coronary artery disease include diabetes mellitus, dyslipidemia, hypertension, post-menopausal, sedentary lifestyle and obesity. She is compliant with treatment most of the time. She is following a generally healthy diet. She participates in exercise intermittently. An ACE inhibitor/angiotensin II receptor blocker is being taken.  Hypertension This is a chronic problem. The current episode started more than 1 year ago. The problem has been gradually improving since onset. The problem is controlled. Pertinent negatives include no blurred vision. The current treatment provides moderate improvement.     Past Medical History:  Diagnosis Date   Allergy 2018   Arthritis 2015   Depression    DM (diabetes mellitus) (HCC)    Exertional dyspnea 05/30/2021   GERD (gastroesophageal reflux disease) 2006   Heart murmur 1998   not addressed since   HTN (hypertension)    Neuromuscular disorder (HCC) 2022   Palpitations 01/19/2022   Pericardial calcification 07/12/2021   Sleep apnea    Tachycardia 05/30/2021     Family History  Problem Relation Age of Onset   Diabetes Mother    Pancreatic cancer Mother    Cancer Mother    Obesity Mother    Hypertension Father    Heart attack Father 45   Heart disease Father    Arthritis Paternal Aunt    Arthritis Paternal Aunt    Diabetes Maternal Grandmother    Breast cancer  Neg Hx    Sleep apnea Neg Hx      Current Outpatient Medications:    Accu-Chek Softclix Lancets lancets, USE TO CHECK BLOOD SUGAR TWICE DAILY, Disp: 200 each, Rfl: 3   amLODipine  (NORVASC ) 5 MG tablet, TAKE 1 TABLET(5 MG) BY MOUTH DAILY, Disp: 90 tablet, Rfl: 3   Blood Glucose Monitoring Suppl (ACCU-CHEK GUIDE) w/Device KIT, Use to check blood  sugars twice daily E11.69, Disp: 1 kit, Rfl: 1   cyclobenzaprine  (FLEXERIL ) 10 MG tablet, Take 1 tablet (10 mg total) by mouth 3 (three) times daily as needed for muscle spasms., Disp: 30 tablet, Rfl: 0   diclofenac Sodium (VOLTAREN) 1 % GEL, Apply topically., Disp: , Rfl:    Evolocumab  (REPATHA  SURECLICK) 140 MG/ML SOAJ, Inject 140 mg into the skin every 14 (fourteen) days., Disp: 6 mL, Rfl: 3   fluticasone (FLONASE) 50 MCG/ACT nasal spray, Place 1 spray into both nostrils daily., Disp: , Rfl:    folic acid  (FOLVITE ) 1 MG tablet, Take 1 mg by mouth daily., Disp: , Rfl:    gabapentin  (NEURONTIN ) 100 MG capsule, Take 1 capsule (100 mg total) by mouth daily. (Patient taking differently: Take 100 mg by mouth as needed.), Disp: 30 capsule, Rfl: 2   glucose blood (ACCU-CHEK GUIDE TEST) test strip, USE TO CHECK BLOOD SUGAR TWICE DAILY AS DIRECTED, Disp: 200 strip, Rfl: 1   hydrochlorothiazide  (HYDRODIURIL ) 25 MG tablet, TAKE 1 TABLET BY MOUTH DAILY, Disp: 100 tablet, Rfl: 2   hydroxychloroquine (PLAQUENIL) 200 MG tablet, Take 200 mg by mouth daily., Disp: , Rfl:    levothyroxine (SYNTHROID) 50 MCG tablet, Take 50 mcg by mouth daily. , Disp: , Rfl:    LINZESS 145 MCG CAPS capsule, Take 145 mcg by mouth every other day., Disp: , Rfl:    methotrexate (RHEUMATREX) 2.5 MG tablet, Take 10 mg by mouth once a week., Disp: , Rfl:    Multiple Vitamin (MULTIVITAMIN) tablet, Take 1 tablet by mouth daily., Disp: , Rfl:    predniSONE (DELTASONE) 5 MG tablet, Take 5 mg by mouth daily., Disp: , Rfl:    Semaglutide , 2 MG/DOSE, (OZEMPIC , 2 MG/DOSE,) 8 MG/3ML SOPN, Inject 2mg  sq once weekl, Disp: 9 mL, Rfl: 2   telmisartan  (MICARDIS ) 80 MG tablet, TAKE 1 TABLET BY MOUTH DAILY, Disp: 100 tablet, Rfl: 2   traZODone  (DESYREL ) 50 MG tablet, Take 1 tablet (50 mg total) by mouth at bedtime., Disp: 90 tablet, Rfl: 1   Vitamin D, Cholecalciferol, 25 MCG (1000 UT) CAPS, Take by mouth., Disp: , Rfl:   Current Facility-Administered  Medications:    sodium chloride  flush (NS) 0.9 % injection 3 mL, 3 mL, Intravenous, Q12H, Raford Riggs, MD   Allergies  Allergen Reactions   Statins     CK elevation   Sulfa Antibiotics Rash   Sulfamethoxazole-Trimethoprim Rash      The patient states she uses status post hysterectomy for birth control. No LMP recorded. Patient has had a hysterectomy.. Negative for Dysmenorrhea. Negative for: breast discharge, breast lump(s), breast pain and breast self exam. Associated symptoms include abnormal vaginal bleeding. Pertinent negatives include abnormal bleeding (hematology), anxiety, decreased libido, depression, difficulty falling sleep, dyspareunia, history of infertility, nocturia, sexual dysfunction, sleep disturbances, urinary incontinence, urinary urgency, vaginal discharge and vaginal itching. Diet regular.The patient states her exercise level is  minimal.   . The patient's tobacco use is:  Social History   Tobacco Use  Smoking Status Former   Current packs/day: 0.00   Average packs/day:  0.8 packs/day for 21.3 years (16.0 ttl pk-yrs)   Types: Cigarettes   Start date: 84   Quit date: 2002   Years since quitting: 23.4   Passive exposure: Past  Smokeless Tobacco Never  . She has been exposed to passive smoke. The patient's alcohol use is:  Social History   Substance and Sexual Activity  Alcohol Use Not Currently   Comment: not often; not weekly, monthly or yearly sometime    Review of Systems  Constitutional: Negative.   HENT: Negative.    Eyes: Negative.  Negative for blurred vision.  Respiratory: Negative.    Cardiovascular: Negative.   Gastrointestinal: Negative.   Endocrine: Negative.   Genitourinary: Negative.   Musculoskeletal:  Positive for back pain and myalgias.  Skin: Negative.   Allergic/Immunologic: Negative.   Neurological: Negative.   Hematological: Negative.   Psychiatric/Behavioral: Negative.       Today's Vitals   07/24/23 1001  BP: 128/60   Pulse: 76  Temp: 98.6 F (37 C)  TempSrc: Oral  Weight: 240 lb (108.9 kg)  Height: 5' 4 (1.626 m)  PainSc: 2   PainLoc: Back   Body mass index is 41.2 kg/m.  Wt Readings from Last 3 Encounters:  07/24/23 240 lb (108.9 kg)  07/23/23 239 lb (108.4 kg)  04/25/23 239 lb 3.2 oz (108.5 kg)     Objective:  Physical Exam Vitals and nursing note reviewed.  Constitutional:      Appearance: Normal appearance. She is obese.  HENT:     Head: Normocephalic and atraumatic.     Right Ear: Tympanic membrane, ear canal and external ear normal.     Left Ear: Tympanic membrane, ear canal and external ear normal.     Nose: Nose normal.     Mouth/Throat:     Mouth: Mucous membranes are moist.     Pharynx: Oropharynx is clear.   Eyes:     Extraocular Movements: Extraocular movements intact.     Conjunctiva/sclera: Conjunctivae normal.     Pupils: Pupils are equal, round, and reactive to light.    Cardiovascular:     Rate and Rhythm: Normal rate and regular rhythm.     Pulses: Normal pulses.          Dorsalis pedis pulses are 2+ on the right side and 2+ on the left side.     Heart sounds: Normal heart sounds.  Pulmonary:     Effort: Pulmonary effort is normal.     Breath sounds: Normal breath sounds.  Chest:  Breasts:    Tanner Score is 5.     Right: Normal.     Left: Normal.  Abdominal:     General: Abdomen is flat. Bowel sounds are normal.     Palpations: Abdomen is soft.  Genitourinary:    Comments: deferred  Musculoskeletal:        General: Normal range of motion.     Cervical back: Normal range of motion and neck supple.  Feet:     Right foot:     Protective Sensation: 5 sites tested.  5 sites sensed.     Skin integrity: Dry skin present.     Toenail Condition: Right toenails are normal.     Left foot:     Protective Sensation: 5 sites tested.  5 sites sensed.     Skin integrity: Dry skin present.     Toenail Condition: Left toenails are normal.   Skin:     General: Skin is warm and  dry.   Neurological:     General: No focal deficit present.     Mental Status: She is alert and oriented to person, place, and time.   Psychiatric:        Mood and Affect: Mood normal.        Behavior: Behavior normal.       Assessment And Plan:     Encounter for annual health examination Assessment & Plan: A full exam was performed.  Importance of monthly self breast exams was discussed with the patient.  She is advised to get 30-45 minutes of regular exercise, no less than four to five days per week. Both weight-bearing and aerobic exercises are recommended.  She is advised to follow a healthy diet with at least six fruits/veggies per day, decrease intake of red meat and other saturated fats and to increase fish intake to twice weekly.  Meats/fish should not be fried -- baked, boiled or broiled is preferable. It is also important to cut back on your sugar intake.  Be sure to read labels - try to avoid anything with added sugar, high fructose corn syrup or other sweeteners.  If you must use a sweetener, you can try stevia or monkfruit.  It is also important to avoid artificially sweetened foods/beverages and diet drinks. Lastly, wear SPF 50 sunscreen on exposed skin and when in direct sunlight for an extended period of time.  Be sure to avoid fast food restaurants and aim for at least 60 ounces of water daily.       Hypertensive heart disease without heart failure Assessment & Plan: Chronic, controlled.  EKG performed, NSR w/ LVH.  She will continue with amlodipine  5mg  daily, telmisartan  80mg  daily and hydrochlorothiazide  25mg  daily. She is encouraged to follow low sodium diet.  -Advised evening dosing of amlodipine  to prevent nocturnal peaks.  Orders: -     EKG 12-Lead -     POCT URINALYSIS DIP (CLINITEK) -     Microalbumin / creatinine urine ratio  Aortic atherosclerosis (HCC) Assessment & Plan: Chronic, not on statin therapy due to statin myopathy. LDL  goal is less than 70.  She will continue with Repatha .    Dyslipidemia associated with type 2 diabetes mellitus (HCC) Assessment & Plan: Chronic, diabetic foot exam was performed. She will continue with Ozempic  2mg  weekly. She will f/u in four months. I DISCUSSED WITH THE PATIENT AT LENGTH REGARDING THE GOALS OF GLYCEMIC CONTROL AND POSSIBLE LONG-TERM COMPLICATIONS.  I  ALSO STRESSED THE IMPORTANCE OF COMPLIANCE WITH HOME GLUCOSE MONITORING, DIETARY RESTRICTIONS INCLUDING AVOIDANCE OF SUGARY DRINKS/PROCESSED FOODS,  ALONG WITH REGULAR EXERCISE.  I  ALSO STRESSED THE IMPORTANCE OF ANNUAL EYE EXAMS, SELF FOOT CARE AND COMPLIANCE WITH OFFICE VISITS.   Orders: -     EKG 12-Lead -     POCT URINALYSIS DIP (CLINITEK) -     Microalbumin / creatinine urine ratio -     Hemoglobin A1c  OSA (obstructive sleep apnea) Assessment & Plan: Chronic, she reports compliance with wearing CPAP at least four hours nightly and has noticed benefit from its use.    Primary hypothyroidism Assessment & Plan: Chronic, currently stable. On levothroxine 50mcg daily.  - I will check thyroid  panel and adjust meds as needed.   Orders: -     TSH + free T4  Anemia, unspecified type -     Iron, TIBC and Ferritin Panel  Spinal stenosis of lumbar region with neurogenic claudication Assessment & Plan: Chronic, most recent MRI  results reviewed. Chronic pain affects balance and radiates to hip. Gabapentin  may reduce pain and reliance on acetaminophen . Water exercises discussed. - Consider more consistent use of gabapentin . - Set up appointment with orthopedic specialist. - Consider water exercises.   Class 3 severe obesity due to excess calories with serious comorbidity and body mass index (BMI) of 40.0 to 44.9 in adult Assessment & Plan: BMI 41.  She is encouraged to aim for at least 150 minutes of exercise per week, while striving to lose ten percent of her body weight to decrease cardiac risk.    COVID-19  vaccine administered Best boy Vaccine 13yrs & older  Statin myopathy Assessment & Plan: She has been intolerant of multiple statins. She is now on Repatha .     Return for 1 year physical, controlled DM check 4 months. Patient was given opportunity to ask questions. Patient verbalized understanding of the plan and was able to repeat key elements of the plan. All questions were answered to their satisfaction.   I, Catheryn LOISE Slocumb, MD, have reviewed all documentation for this visit. The documentation on 07/24/23 for the exam, diagnosis, procedures, and orders are all accurate and complete.

## 2023-07-24 NOTE — Assessment & Plan Note (Signed)
 She has been intolerant of multiple statins. She is now on Repatha.

## 2023-07-25 LAB — IRON,TIBC AND FERRITIN PANEL
Ferritin: 116 ng/mL (ref 15–150)
Iron Saturation: 17 % (ref 15–55)
Iron: 55 ug/dL (ref 27–139)
Total Iron Binding Capacity: 319 ug/dL (ref 250–450)
UIBC: 264 ug/dL (ref 118–369)

## 2023-07-25 LAB — HEMOGLOBIN A1C
Est. average glucose Bld gHb Est-mCnc: 128 mg/dL
Hgb A1c MFr Bld: 6.1 % — ABNORMAL HIGH (ref 4.8–5.6)

## 2023-07-25 LAB — MICROALBUMIN / CREATININE URINE RATIO
Creatinine, Urine: 233.6 mg/dL
Microalb/Creat Ratio: 15 mg/g{creat} (ref 0–29)
Microalbumin, Urine: 33.9 ug/mL

## 2023-07-25 LAB — TSH+FREE T4
Free T4: 1.24 ng/dL (ref 0.82–1.77)
TSH: 1.45 u[IU]/mL (ref 0.450–4.500)

## 2023-08-04 DIAGNOSIS — Z0000 Encounter for general adult medical examination without abnormal findings: Secondary | ICD-10-CM | POA: Insufficient documentation

## 2023-08-04 NOTE — Assessment & Plan Note (Addendum)
 Chronic, diabetic foot exam was performed. She will continue with Ozempic  2mg  weekly. She will f/u in four months. I DISCUSSED WITH THE PATIENT AT LENGTH REGARDING THE GOALS OF GLYCEMIC CONTROL AND POSSIBLE LONG-TERM COMPLICATIONS.  I  ALSO STRESSED THE IMPORTANCE OF COMPLIANCE WITH HOME GLUCOSE MONITORING, DIETARY RESTRICTIONS INCLUDING AVOIDANCE OF SUGARY DRINKS/PROCESSED FOODS,  ALONG WITH REGULAR EXERCISE.  I  ALSO STRESSED THE IMPORTANCE OF ANNUAL EYE EXAMS, SELF FOOT CARE AND COMPLIANCE WITH OFFICE VISITS.

## 2023-08-04 NOTE — Assessment & Plan Note (Signed)
 Chronic, she reports compliance with wearing CPAP at least four hours nightly and has noticed benefit from its use.

## 2023-08-04 NOTE — Assessment & Plan Note (Signed)
 Chronic, controlled.  EKG performed, NSR w/ LVH.  She will continue with amlodipine  5mg  daily, telmisartan  80mg  daily and hydrochlorothiazide  25mg  daily. She is encouraged to follow low sodium diet.  -Advised evening dosing of amlodipine  to prevent nocturnal peaks.

## 2023-08-04 NOTE — Assessment & Plan Note (Signed)
 Chronic, not on statin therapy due to statin myopathy. LDL goal is less than 70.  She will continue with Repatha.

## 2023-08-04 NOTE — Assessment & Plan Note (Signed)

## 2023-08-04 NOTE — Assessment & Plan Note (Signed)
BMI 41. She is encouraged to aim for at least 150 minutes of exercise per week, while striving to lose ten percent of her body weight to decrease cardiac risk.

## 2023-08-07 ENCOUNTER — Ambulatory Visit (INDEPENDENT_AMBULATORY_CARE_PROVIDER_SITE_OTHER): Payer: Self-pay | Admitting: Internal Medicine

## 2023-08-07 ENCOUNTER — Encounter: Payer: Self-pay | Admitting: Internal Medicine

## 2023-08-07 VITALS — BP 120/70 | HR 96 | Temp 98.2°F | Ht 64.0 in | Wt 238.0 lb

## 2023-08-07 DIAGNOSIS — M7989 Other specified soft tissue disorders: Secondary | ICD-10-CM | POA: Diagnosis not present

## 2023-08-07 DIAGNOSIS — Z87891 Personal history of nicotine dependence: Secondary | ICD-10-CM | POA: Insufficient documentation

## 2023-08-07 DIAGNOSIS — R911 Solitary pulmonary nodule: Secondary | ICD-10-CM | POA: Diagnosis not present

## 2023-08-07 NOTE — Progress Notes (Signed)
 I,Jameka J Llittleton, CMA,acting as a Neurosurgeon for Catheryn LOISE Slocumb, MD.,have documented all relevant documentation on the behalf of Catheryn LOISE Slocumb, MD,as directed by  Catheryn LOISE Slocumb, MD while in the presence of Catheryn LOISE Slocumb, MD.  Subjective:  Patient ID: Michelle Hicks , female    DOB: 05/04/1950 , 73 y.o.   MRN: [id number]  Chief Complaint  Patient presents with   Mass    Patient presents today for a lump on her right clavicle. She reports she noticed the lump a week ago. She reports the lump moves around. She denies the lump hurting or increasing in size.     HPI Discussed the use of AI scribe software for clinical note transcription with the patient, who gave verbal consent to proceed.  History of Present Illness Michelle Hicks is a 73 year old female who presents with a movable lump near her clavicle.  She noticed a 'knot' or 'bump' near her clavicle that is movable, similar to a previous knot on her finger that eventually resolved. This lump was first noticed this week.  She has a history of smoking, having smoked less than a pack a day for about 20 years before quitting in 2002. No recent weight loss, changes in appetite, or cough, although she occasionally clears her throat.   Past Medical History:  Diagnosis Date   Allergy 2018   Arthritis 2015   Depression    DM (diabetes mellitus) (HCC)    Exertional dyspnea 05/30/2021   GERD (gastroesophageal reflux disease) 2006   Heart murmur 1998   not addressed since   HTN (hypertension)    Neuromuscular disorder (HCC) 2022   Palpitations 01/19/2022   Pericardial calcification 07/12/2021   Sleep apnea    Tachycardia 05/30/2021     Family History  Problem Relation Age of Onset   Diabetes Mother    Pancreatic cancer Mother    Cancer Mother    Obesity Mother    Hypertension Father    Heart attack Father 19   Heart disease Father    Arthritis Paternal Aunt    Arthritis Paternal Aunt    Diabetes Maternal Grandmother     Breast cancer Neg Hx    Sleep apnea Neg Hx      Current Outpatient Medications:    Accu-Chek Softclix Lancets lancets, USE TO CHECK BLOOD SUGAR TWICE DAILY, Disp: 200 each, Rfl: 3   amLODipine  (NORVASC ) 5 MG tablet, TAKE 1 TABLET(5 MG) BY MOUTH DAILY, Disp: 90 tablet, Rfl: 3   Blood Glucose Monitoring Suppl (ACCU-CHEK GUIDE) w/Device KIT, Use to check blood sugars twice daily E11.69, Disp: 1 kit, Rfl: 1   cyclobenzaprine  (FLEXERIL ) 10 MG tablet, Take 1 tablet (10 mg total) by mouth 3 (three) times daily as needed for muscle spasms., Disp: 30 tablet, Rfl: 0   diclofenac Sodium (VOLTAREN) 1 % GEL, Apply topically., Disp: , Rfl:    Evolocumab  (REPATHA  SURECLICK) 140 MG/ML SOAJ, Inject 140 mg into the skin every 14 (fourteen) days., Disp: 6 mL, Rfl: 3   fluticasone (FLONASE) 50 MCG/ACT nasal spray, Place 1 spray into both nostrils daily., Disp: , Rfl:    folic acid  (FOLVITE ) 1 MG tablet, Take 1 mg by mouth daily., Disp: , Rfl:    glucose blood (ACCU-CHEK GUIDE TEST) test strip, USE TO CHECK BLOOD SUGAR TWICE DAILY AS DIRECTED, Disp: 200 strip, Rfl: 1   hydrochlorothiazide  (HYDRODIURIL ) 25 MG tablet, TAKE 1 TABLET BY MOUTH DAILY, Disp: 100 tablet, Rfl: 2  hydroxychloroquine (PLAQUENIL) 200 MG tablet, Take 200 mg by mouth daily., Disp: , Rfl:    LINZESS 145 MCG CAPS capsule, Take 145 mcg by mouth every other day., Disp: , Rfl:    methotrexate (RHEUMATREX) 2.5 MG tablet, Take 10 mg by mouth once a week., Disp: , Rfl:    Multiple Vitamin (MULTIVITAMIN) tablet, Take 1 tablet by mouth daily., Disp: , Rfl:    predniSONE (DELTASONE) 5 MG tablet, Take 5 mg by mouth daily., Disp: , Rfl:    Semaglutide , 2 MG/DOSE, (OZEMPIC , 2 MG/DOSE,) 8 MG/3ML SOPN, Inject 2mg  sq once weekl, Disp: 9 mL, Rfl: 2   telmisartan  (MICARDIS ) 80 MG tablet, TAKE 1 TABLET BY MOUTH DAILY, Disp: 100 tablet, Rfl: 2   traZODone  (DESYREL ) 50 MG tablet, Take 1 tablet (50 mg total) by mouth at bedtime., Disp: 90 tablet, Rfl: 1    Vitamin D, Cholecalciferol, 25 MCG (1000 UT) CAPS, Take by mouth., Disp: , Rfl:    gabapentin  (NEURONTIN ) 100 MG capsule, Take 1 capsule (100 mg total) by mouth daily. (Patient not taking: Reported on 08/07/2023), Disp: 30 capsule, Rfl: 2   levothyroxine (SYNTHROID) 50 MCG tablet, Take 50 mcg by mouth daily. , Disp: , Rfl:   Current Facility-Administered Medications:    sodium chloride  flush (NS) 0.9 % injection 3 mL, 3 mL, Intravenous, Q12H, Raford Riggs, MD   Allergies  Allergen Reactions   Statins     CK elevation   Sulfa Antibiotics Rash   Sulfamethoxazole-Trimethoprim Rash     Review of Systems  Constitutional: Negative.   Respiratory: Negative.    Cardiovascular: Negative.   Gastrointestinal: Negative.   Neurological: Negative.   Psychiatric/Behavioral: Negative.       Today's Vitals   08/07/23 1115  BP: 120/70  Pulse: 96  Temp: 98.2 F (36.8 C)  Weight: 238 lb (108 kg)  Height: 5' 4 (1.626 m)  PainSc: 0-No pain   Body mass index is 40.85 kg/m.  Wt Readings from Last 3 Encounters:  08/07/23 238 lb (108 kg)  07/24/23 240 lb (108.9 kg)  07/23/23 239 lb (108.4 kg)    The 10-year ASCVD risk score (Arnett DK, et al., 2019) is: 19.3%   Values used to calculate the score:     Age: 89 years     Clincally relevant sex: Female     Is Non-Hispanic African American: Yes     Diabetic: Yes     Tobacco smoker: No     Systolic Blood Pressure: 120 mmHg     Is BP treated: Yes     HDL Cholesterol: 80 mg/dL     Total Cholesterol: 151 mg/dL  Objective:  Physical Exam Vitals and nursing note reviewed.  Constitutional:      Appearance: Normal appearance.  HENT:     Head: Normocephalic and atraumatic.   Cardiovascular:     Rate and Rhythm: Normal rate and regular rhythm.     Heart sounds: Normal heart sounds.  Pulmonary:     Effort: Pulmonary effort is normal.     Breath sounds: Normal breath sounds.   Musculoskeletal:       Arms:     Cervical back: Normal  range of motion.     Comments: Palpable nodule anterior to right clavicle No overlying erythema   Skin:    General: Skin is warm.   Neurological:     General: No focal deficit present.     Mental Status: She is alert.   Psychiatric:  Mood and Affect: Mood normal.        Behavior: Behavior normal.         Assessment And Plan:  Nodule of soft tissue Assessment & Plan: Clavicular Lump Movable, non-fixed, non-hard lump near clavicle. Differential includes lymph node or benign soft tissue mass. She has a remote smoking history, quit in 2002.  . - Spoke with Radiologist, advised to assess CT chest since this is anterior to clavicle and not a supraclavicular lesion - ultrasound would likely not give definitive diagnosis - Pt is agreeable to treatment plan  Orders: -     CT CHEST WO CONTRAST; Future  Lung nodule Assessment & Plan: CT chest from June 2024 reviewed, revealed stable lung nodule. She has h/o tobacco use.  Will check CT Chest as described above.   Orders: -     CT CHEST WO CONTRAST; Future  History of smoking 10-25 pack years -     CT CHEST WO CONTRAST; Future    Return if symptoms worsen or fail to improve.  Patient was given opportunity to ask questions. Patient verbalized understanding of the plan and was able to repeat key elements of the plan. All questions were answered to their satisfaction.  Assessment and Plan    I, Catheryn LOISE Slocumb, MD, have reviewed all documentation for this visit. The documentation on 08/07/23 for the exam, diagnosis, procedures, and orders are all accurate and complete.   IF YOU HAVE BEEN REFERRED TO A SPECIALIST, IT MAY TAKE 1-2 WEEKS TO SCHEDULE/PROCESS THE REFERRAL. IF YOU HAVE NOT HEARD FROM US /SPECIALIST IN TWO WEEKS, PLEASE GIVE US  A CALL AT [phone].

## 2023-08-07 NOTE — Assessment & Plan Note (Signed)
 Clavicular Lump Movable, non-fixed, non-hard lump near clavicle. Differential includes lymph node or benign soft tissue mass. She has a remote smoking history, quit in 2002.  Michelle Hicks - Spoke with Radiologist, advised to assess CT chest since this is anterior to clavicle and not a supraclavicular lesion - ultrasound would likely not give definitive diagnosis - Pt is agreeable to treatment plan

## 2023-08-07 NOTE — Assessment & Plan Note (Signed)
 CT chest from June 2024 reviewed, revealed stable lung nodule. She has h/o tobacco use.  Will check CT Chest as described above.

## 2023-08-09 ENCOUNTER — Other Ambulatory Visit (HOSPITAL_COMMUNITY)
Admission: RE | Admit: 2023-08-09 | Discharge: 2023-08-09 | Disposition: A | Discharge status: Home / Self Care | Source: Ambulatory Visit | Attending: Obstetrics and Gynecology | Admitting: Obstetrics and Gynecology

## 2023-08-09 ENCOUNTER — Ambulatory Visit (INDEPENDENT_AMBULATORY_CARE_PROVIDER_SITE_OTHER): Admitting: Obstetrics and Gynecology

## 2023-08-09 ENCOUNTER — Encounter: Payer: Self-pay | Admitting: Obstetrics and Gynecology

## 2023-08-09 VITALS — BP 102/60 | HR 94 | Temp 98.5°F | Ht 65.5 in | Wt 236.0 lb

## 2023-08-09 DIAGNOSIS — N9089 Other specified noninflammatory disorders of vulva and perineum: Secondary | ICD-10-CM | POA: Insufficient documentation

## 2023-08-09 DIAGNOSIS — Z124 Encounter for screening for malignant neoplasm of cervix: Secondary | ICD-10-CM | POA: Diagnosis present

## 2023-08-09 DIAGNOSIS — Z01419 Encounter for gynecological examination (general) (routine) without abnormal findings: Secondary | ICD-10-CM | POA: Diagnosis not present

## 2023-08-09 DIAGNOSIS — Z1331 Encounter for screening for depression: Secondary | ICD-10-CM | POA: Diagnosis not present

## 2023-08-09 DIAGNOSIS — N958 Other specified menopausal and perimenopausal disorders: Secondary | ICD-10-CM | POA: Insufficient documentation

## 2023-08-09 NOTE — Patient Instructions (Signed)

## 2023-08-09 NOTE — Assessment & Plan Note (Signed)
 Considering applying aquaphor or coconut oil to the vulva after showers. You could also using daily vaginal moisturizers by brands like Good Clean Love and Ah! Yes.  Reviewed safety profile of low dose vaginal estrogen, however reviewed that higher doses have been associated with DVT, breast and uterine cancer.   She would like to start with OTC options.

## 2023-08-09 NOTE — Progress Notes (Signed)
 73 y.o. G1P0010 postmenopausal female s/p supracervical hysterectomy (fibroids) here for annual exam. Married.  She reports vaginal itching, vaginal dryness, painful IC at times. GCG-Medicare-LR  Postmenopausal bleeding: none Pelvic discharge or pain: none Breast mass, nipple discharge or skin changes : none Sexually active: Not currently   Last PAP: 2021 per pt  Last mammogram: 05/17/23 Bi-Rads 1, b Last DXA: 08/01/18 normal Last colonoscopy: 12/25/19   Exercising: stretching 5 days a week Smoker:No  Flowsheet Row Office Visit from 08/09/2023 in [facility]  PHQ-2 Total Score 0    Flowsheet Row Office Visit from 08/07/2023 in [facility]  PHQ-9 Total Score 0     GYN HISTORY: Fibroids  OB History  Gravida Para Term Preterm AB Living  1    1   SAB IAB Ectopic Multiple Live Births  1        # Outcome Date GA Lbr Len/2nd Weight Sex Type Anes PTL Lv  1 SAB            Past Medical History:  Diagnosis Date   Allergy 2018   Arthritis 2015   Depression    DM (diabetes mellitus) (HCC)    Exertional dyspnea 05/30/2021   GERD (gastroesophageal reflux disease) 2006   Heart murmur 1998   not addressed since   HTN (hypertension)    Neuromuscular disorder (HCC) 2022   Palpitations 01/19/2022   Pericardial calcification 07/12/2021   Sleep apnea    Tachycardia 05/30/2021   Past Surgical History:  Procedure Laterality Date   ABDOMINAL HYSTERECTOMY     RIGHT/LEFT HEART CATH AND CORONARY ANGIOGRAPHY N/A 07/22/2021   Procedure: RIGHT/LEFT HEART CATH AND CORONARY ANGIOGRAPHY;  Surgeon: Wonda Sharper, MD;  Location: [facility] INVASIVE CV LAB;  Service: Cardiovascular;  Laterality: N/A;   TONSILLECTOMY Bilateral 1967   Current Outpatient Medications on File Prior to Visit  Medication Sig Dispense Refill   Accu-Chek Softclix Lancets lancets USE TO CHECK BLOOD SUGAR TWICE DAILY 200 each 3   amLODipine  (NORVASC ) 5 MG  tablet TAKE 1 TABLET(5 MG) BY MOUTH DAILY 90 tablet 3   Blood Glucose Monitoring Suppl (ACCU-CHEK GUIDE) w/Device KIT Use to check blood sugars twice daily E11.69 1 kit 1   cyclobenzaprine  (FLEXERIL ) 10 MG tablet Take 1 tablet (10 mg total) by mouth 3 (three) times daily as needed for muscle spasms. 30 tablet 0   diclofenac Sodium (VOLTAREN) 1 % GEL Apply topically.     Evolocumab  (REPATHA  SURECLICK) 140 MG/ML SOAJ Inject 140 mg into the skin every 14 (fourteen) days. 6 mL 3   fluticasone (FLONASE) 50 MCG/ACT nasal spray Place 1 spray into both nostrils daily.     folic acid  (FOLVITE ) 1 MG tablet Take 1 mg by mouth daily.     gabapentin  (NEURONTIN ) 100 MG capsule Take 1 capsule (100 mg total) by mouth daily. 30 capsule 2   glucose blood (ACCU-CHEK GUIDE TEST) test strip USE TO CHECK BLOOD SUGAR TWICE DAILY AS DIRECTED 200 strip 1   hydrochlorothiazide  (HYDRODIURIL ) 25 MG tablet TAKE 1 TABLET BY MOUTH DAILY 100 tablet 2   hydroxychloroquine (PLAQUENIL) 200 MG tablet Take 200 mg by mouth daily.     levothyroxine (SYNTHROID) 50 MCG tablet Take 50 mcg by mouth daily.      LINZESS 145 MCG CAPS capsule Take 145 mcg by mouth every other day.     methotrexate (RHEUMATREX) 2.5 MG tablet Take 10 mg by mouth once a week.  Multiple Vitamin (MULTIVITAMIN) tablet Take 1 tablet by mouth daily.     predniSONE (DELTASONE) 5 MG tablet Take 5 mg by mouth daily.     Semaglutide , 2 MG/DOSE, (OZEMPIC , 2 MG/DOSE,) 8 MG/3ML SOPN Inject 2mg  sq once weekl 9 mL 2   telmisartan  (MICARDIS ) 80 MG tablet TAKE 1 TABLET BY MOUTH DAILY 100 tablet 2   traZODone  (DESYREL ) 50 MG tablet Take 1 tablet (50 mg total) by mouth at bedtime. 90 tablet 1   Vitamin D, Cholecalciferol, 25 MCG (1000 UT) CAPS Take by mouth.     Current Facility-Administered Medications on File Prior to Visit  Medication Dose Route Frequency Provider Last Rate Last Admin   sodium chloride  flush (NS) 0.9 % injection 3 mL  3 mL Intravenous Q12H Raford Riggs, MD       Social History   Socioeconomic History   Marital status: Married    Spouse name: Not on file   Number of children: Not on file   Years of education: Not on file   Highest education level: Bachelor's degree (e.g., BA, AB, BS)  Occupational History   Occupation: retired  Tobacco Use   Smoking status: Former    Current packs/day: 0.00    Average packs/day: 0.8 packs/day for 21.3 years (16.0 ttl pk-yrs)    Types: Cigarettes    Start date: 57    Quit date: 2002    Years since quitting: 23.4    Passive exposure: Past   Smokeless tobacco: Never  Vaping Use   Vaping status: Never Used  Substance and Sexual Activity   Alcohol use: Yes    Comment: not often; not weekly, monthly or yearly sometime   Drug use: No   Sexual activity: Not Currently    Partners: Male    Birth control/protection: Surgical  Other Topics Concern   Not on file  Social History Narrative   Pt lives with family    Retired    Chief Executive Officer Drivers of Corporate investment banker Strain: Low Risk  (08/06/2023)   Overall Financial Resource Strain (CARDIA)    Difficulty of Paying Living Expenses: Not very hard  Food Insecurity: No Food Insecurity (08/06/2023)   Hunger Vital Sign    Worried About Running Out of Food in the Last Year: Never true    Ran Out of Food in the Last Year: Never true  Transportation Needs: No Transportation Needs (08/06/2023)   PRAPARE - Administrator, Civil Service (Medical): No    Lack of Transportation (Non-Medical): No  Physical Activity: Insufficiently Active (08/06/2023)   Exercise Vital Sign    Days of Exercise per Week: 1 day    Minutes of Exercise per Session: 10 min  Stress: No Stress Concern Present (08/06/2023)   [employer] of Occupational Health - Occupational Stress Questionnaire    Feeling of Stress: Not at all  Social Connections: Unknown (08/06/2023)   Social Connection and Isolation Panel    Frequency of Communication with Friends and  Family: Twice a week    Frequency of Social Gatherings with Friends and Family: Once a week    Attends Religious Services: 1 to 4 times per year    Active Member of [employer] or Organizations: Patient declined    Attends Banker Meetings: Not on file    Marital Status: Married  Intimate Partner Violence: Not At Risk (04/25/2023)   Humiliation, Afraid, Rape, and Kick questionnaire    Fear of Current or Ex-Partner: No  Emotionally Abused: No    Physically Abused: No    Sexually Abused: No   Family History  Problem Relation Age of Onset   Diabetes Mother    Pancreatic cancer Mother    Cancer Mother    Obesity Mother    Hypertension Father    Heart attack Father 7   Heart disease Father    Arthritis Paternal Aunt    Arthritis Paternal Aunt    Diabetes Maternal Grandmother    Breast cancer Neg Hx    Sleep apnea Neg Hx    Allergies  Allergen Reactions   Statins     CK elevation   Sulfa Antibiotics Rash   Sulfamethoxazole-Trimethoprim Rash      PE Today's Vitals   08/09/23 1118  BP: 102/60  Pulse: 94  Temp: 98.5 F (36.9 C)  TempSrc: Oral  SpO2: 99%  Weight: 236 lb (107 kg)  Height: 5' 5.5 (1.664 m)   Body mass index is 38.68 kg/m.  Physical Exam Vitals reviewed. Exam conducted with a chaperone present.  Constitutional:      General: She is not in acute distress.    Appearance: Normal appearance.  HENT:     Head: Normocephalic and atraumatic.     Nose: Nose normal.   Eyes:     Extraocular Movements: Extraocular movements intact.     Conjunctiva/sclera: Conjunctivae normal.   Neck:     Thyroid : No thyroid  mass, thyromegaly or thyroid  tenderness.  Pulmonary:     Effort: Pulmonary effort is normal.  Chest:     Chest wall: No mass or tenderness.  Breasts:    Right: Normal. No swelling, mass, nipple discharge or tenderness.     Left: Normal. No swelling, mass, nipple discharge or tenderness.  Abdominal:     General: There is no distension.      Palpations: Abdomen is soft.     Tenderness: There is no abdominal tenderness.  Genitourinary:    General: Normal vulva.     Exam position: Lithotomy position.     Urethra: No prolapse.     Vagina: Normal. No vaginal discharge or bleeding.     Cervix: Friability present. No lesion.     Adnexa: Right adnexa normal and left adnexa normal.      Comments: Splotchy mixed hyper/hypopigmentation of introitus Cervix present and uterus absent  Musculoskeletal:        General: Normal range of motion.     Cervical back: Normal range of motion.  Lymphadenopathy:     Upper Body:     Right upper body: No axillary adenopathy.     Left upper body: No axillary adenopathy.     Lower Body: No right inguinal adenopathy. No left inguinal adenopathy.   Skin:    General: Skin is warm and dry.   Neurological:     General: No focal deficit present.     Mental Status: She is alert.   Psychiatric:        Mood and Affect: Mood normal.        Behavior: Behavior normal.      Assessment and Plan:        Encounter for breast and pelvic examination Assessment & Plan: Cervical cancer screening performed according to ASCCP guidelines. Encouraged annual mammogram screening Colonoscopy UTD DXA UTD Labs and immunizations with her primary Encouraged safe sexual practices as indicated Encouraged healthy lifestyle practices with diet and exercise For patients under 50-70yo, I recommend 1200mg  calcium daily and 600IU of vitamin D  daily.    Negative depression screening  Cervical cancer screening -     Cytology - PAP  Vulvar lesion -     Biopsy vulva; Future  Genitourinary syndrome of menopause Assessment & Plan: Considering applying aquaphor or coconut oil to the vulva after showers. You could also using daily vaginal moisturizers by brands like Good Clean Love and Ah! Yes.  Reviewed safety profile of low dose vaginal estrogen, however reviewed that higher doses have been associated with DVT,  breast and uterine cancer.   She would like to start with OTC options.    Vera LULLA Pa, MD

## 2023-08-09 NOTE — Assessment & Plan Note (Signed)
 Cervical cancer screening performed according to ASCCP guidelines. Encouraged annual mammogram screening Colonoscopy UTD DXA UTD Labs and immunizations with her primary Encouraged safe sexual practices as indicated Encouraged healthy lifestyle practices with diet and exercise For patients under 50-70yo, I recommend 1200mg  calcium daily and 600IU of vitamin D daily.

## 2023-08-14 ENCOUNTER — Other Ambulatory Visit

## 2023-08-14 ENCOUNTER — Ambulatory Visit: Payer: Self-pay | Admitting: Obstetrics and Gynecology

## 2023-08-14 LAB — CYTOLOGY - PAP: Diagnosis: NEGATIVE

## 2023-08-15 ENCOUNTER — Other Ambulatory Visit: Payer: Self-pay

## 2023-08-15 MED ORDER — TRAZODONE HCL 50 MG PO TABS: 50.0000 mg | ORAL_TABLET | Freq: Every day | ORAL | 1 refills | Status: DC

## 2023-08-15 NOTE — Telephone Encounter (Signed)
 Trazodone  refill request completed

## 2023-08-23 ENCOUNTER — Ambulatory Visit
Admission: RE | Admit: 2023-08-23 | Discharge: 2023-08-23 | Disposition: A | Discharge status: Home / Self Care | Source: Ambulatory Visit | Attending: Internal Medicine | Admitting: Internal Medicine

## 2023-08-23 DIAGNOSIS — R911 Solitary pulmonary nodule: Secondary | ICD-10-CM

## 2023-08-23 DIAGNOSIS — Z87891 Personal history of nicotine dependence: Secondary | ICD-10-CM

## 2023-08-23 DIAGNOSIS — M7989 Other specified soft tissue disorders: Secondary | ICD-10-CM

## 2023-08-27 ENCOUNTER — Ambulatory Visit: Payer: Self-pay | Admitting: Internal Medicine

## 2023-08-30 ENCOUNTER — Ambulatory Visit: Admitting: Obstetrics and Gynecology

## 2023-09-05 ENCOUNTER — Encounter: Payer: Self-pay | Admitting: Obstetrics and Gynecology

## 2023-09-05 ENCOUNTER — Ambulatory Visit (INDEPENDENT_AMBULATORY_CARE_PROVIDER_SITE_OTHER): Admitting: Obstetrics and Gynecology

## 2023-09-05 ENCOUNTER — Other Ambulatory Visit (HOSPITAL_COMMUNITY)
Admission: RE | Admit: 2023-09-05 | Discharge: 2023-09-05 | Disposition: A | Discharge status: Home / Self Care | Source: Ambulatory Visit | Attending: Obstetrics and Gynecology | Admitting: Obstetrics and Gynecology

## 2023-09-05 VITALS — BP 110/60 | HR 92 | Temp 97.6°F | Wt 237.0 lb

## 2023-09-05 DIAGNOSIS — N9089 Other specified noninflammatory disorders of vulva and perineum: Secondary | ICD-10-CM | POA: Diagnosis present

## 2023-09-05 DIAGNOSIS — N958 Other specified menopausal and perimenopausal disorders: Secondary | ICD-10-CM

## 2023-09-05 MED ORDER — LIDOCAINE-EPINEPHRINE 1 %-1:100000 IJ SOLN: 10.0000 mL | Freq: Once | INTRAMUSCULAR | Status: AC

## 2023-09-05 NOTE — Progress Notes (Signed)
 73 y.o. G1P0010 postmenopausal female s/p supracervical hysterectomy (fibroids) here for vulvar biopsy for lesion. Married.  At annual exam, she reported: She reports vaginal itching, vaginal dryness, painful IC at times.   Postmenopausal bleeding: none Pelvic discharge or pain: none Breast mass, nipple discharge or skin changes : none Sexually active: Not currently  Smoker:No  GYN HISTORY: Fibroids  OB History  Gravida Para Term Preterm AB Living  1    1   SAB IAB Ectopic Multiple Live Births  1        # Outcome Date GA Lbr Len/2nd Weight Sex Type Anes PTL Lv  1 SAB            Past Medical History:  Diagnosis Date   Allergy 2018   Arthritis 2015   Depression    DM (diabetes mellitus) (HCC)    Exertional dyspnea 05/30/2021   GERD (gastroesophageal reflux disease) 2006   Heart murmur 1998   not addressed since   HTN (hypertension)    Neuromuscular disorder (HCC) 2022   Palpitations 01/19/2022   Pericardial calcification 07/12/2021   Sleep apnea    Tachycardia 05/30/2021   Past Surgical History:  Procedure Laterality Date   ABDOMINAL HYSTERECTOMY     RIGHT/LEFT HEART CATH AND CORONARY ANGIOGRAPHY N/A 07/22/2021   Procedure: RIGHT/LEFT HEART CATH AND CORONARY ANGIOGRAPHY;  Surgeon: Wonda Sharper, MD;  Location: [facility] INVASIVE CV LAB;  Service: Cardiovascular;  Laterality: N/A;   TONSILLECTOMY Bilateral 1967   Current Outpatient Medications on File Prior to Visit  Medication Sig Dispense Refill   Accu-Chek Softclix Lancets lancets USE TO CHECK BLOOD SUGAR TWICE DAILY 200 each 3   amLODipine  (NORVASC ) 5 MG tablet TAKE 1 TABLET(5 MG) BY MOUTH DAILY 90 tablet 3   Blood Glucose Monitoring Suppl (ACCU-CHEK GUIDE) w/Device KIT Use to check blood sugars twice daily E11.69 1 kit 1   cyclobenzaprine  (FLEXERIL ) 10 MG tablet Take 1 tablet (10 mg total) by mouth 3 (three) times daily as needed for muscle spasms. 30 tablet 0   diclofenac Sodium (VOLTAREN) 1 % GEL Apply topically.      Evolocumab  (REPATHA  SURECLICK) 140 MG/ML SOAJ Inject 140 mg into the skin every 14 (fourteen) days. 6 mL 3   fluticasone (FLONASE) 50 MCG/ACT nasal spray Place 1 spray into both nostrils daily.     folic acid  (FOLVITE ) 1 MG tablet Take 1 mg by mouth daily.     gabapentin  (NEURONTIN ) 100 MG capsule Take 1 capsule (100 mg total) by mouth daily. 30 capsule 2   glucose blood (ACCU-CHEK GUIDE TEST) test strip USE TO CHECK BLOOD SUGAR TWICE DAILY AS DIRECTED 200 strip 1   hydrochlorothiazide  (HYDRODIURIL ) 25 MG tablet TAKE 1 TABLET BY MOUTH DAILY 100 tablet 2   hydroxychloroquine (PLAQUENIL) 200 MG tablet Take 200 mg by mouth daily.     levothyroxine (SYNTHROID) 50 MCG tablet Take 50 mcg by mouth daily.      LINZESS 145 MCG CAPS capsule Take 145 mcg by mouth every other day.     methotrexate (RHEUMATREX) 2.5 MG tablet Take 10 mg by mouth once a week.     Multiple Vitamin (MULTIVITAMIN) tablet Take 1 tablet by mouth daily.     predniSONE (DELTASONE) 5 MG tablet Take 5 mg by mouth daily.     Semaglutide , 2 MG/DOSE, (OZEMPIC , 2 MG/DOSE,) 8 MG/3ML SOPN Inject 2mg  sq once weekl 9 mL 2   telmisartan  (MICARDIS ) 80 MG tablet TAKE 1 TABLET BY MOUTH DAILY 100 tablet  2   traZODone  (DESYREL ) 50 MG tablet Take 1 tablet (50 mg total) by mouth at bedtime. 90 tablet 1   Vitamin D, Cholecalciferol, 25 MCG (1000 UT) CAPS Take by mouth.     Current Facility-Administered Medications on File Prior to Visit  Medication Dose Route Frequency Provider Last Rate Last Admin   sodium chloride  flush (NS) 0.9 % injection 3 mL  3 mL Intravenous Q12H Raford Riggs, MD        Allergies  Allergen Reactions   Statins     CK elevation   Sulfa Antibiotics Rash   Sulfamethoxazole-Trimethoprim Rash     PE Today's Vitals   09/05/23 0954  BP: 110/60  Pulse: 92  Temp: 97.6 F (36.4 C)  TempSrc: Oral  SpO2: 98%  Weight: 237 lb (107.5 kg)    Body mass index is 38.84 kg/m.  Physical Exam Vitals reviewed. Exam  conducted with a chaperone present.  Constitutional:      General: She is not in acute distress.    Appearance: Normal appearance.  HENT:     Head: Normocephalic and atraumatic.     Nose: Nose normal.  Eyes:     Extraocular Movements: Extraocular movements intact.     Conjunctiva/sclera: Conjunctivae normal.  Pulmonary:     Effort: Pulmonary effort is normal.  Abdominal:     General: There is no distension.     Palpations: Abdomen is soft.     Tenderness: There is no abdominal tenderness.  Genitourinary:    General: Normal vulva.     Exam position: Lithotomy position.     Urethra: No prolapse.      Comments: Splotchy mixed hyper/hypopigmentation of introitus Cervix present and uterus absent Musculoskeletal:        General: Normal range of motion.     Cervical back: Normal range of motion.  Lymphadenopathy:     Upper Body:     Right upper body: No axillary adenopathy.     Left upper body: No axillary adenopathy.     Lower Body: No right inguinal adenopathy. No left inguinal adenopathy.  Skin:    General: Skin is warm and dry.  Neurological:     General: No focal deficit present.     Mental Status: She is alert.  Psychiatric:        Mood and Affect: Mood normal.        Behavior: Behavior normal.     Indication for biopsy: vulvar lesion  The indications for vulvar biopsy were reviewed. Risks of the biopsy including pain, bleeding, infection, inadequate specimen, and need for additional procedures were discussed. The patient stated understanding and agreed to undergo procedure today. Consent was signed, and an adequate time out performed.   The patient's vulva was prepped with hibiclens. 1% lidocaine  w/ epi was injected into left lower vulva. A 4-mm punch biopsy was done at 5:00, biopsy tissue was picked up with sterile forceps and sterile scissors were used to excise the lesion.  Small bleeding was noted and hemostasis was achieved using silver nitrate sticks.    The  patient tolerated the procedure well.  Chaperone, s. Dallie was present for the entirety of the procedure  Specimens: left vulva  Post-procedure instructions were given to the patient. The patient is to call with heavy bleeding, fever greater than 100.4, foul smelling vaginal discharge or other concerns. The patient will be return to clinic in two weeks for discussion of results.    Assessment and Plan:  Vulvar lesion -     Biopsy vulva -     Surgical pathology       -     Lidocaine -EPINEPHrine  Genitourinary syndrome of menopause  Considering applying aquaphor or coconut oil to the vulva after showers. You could also using daily vaginal moisturizers by brands like Good Clean Love and Ah! Yes. Discussed and recommend PV estradiol is symptoms are not improved.  Vera LULLA Pa, MD

## 2023-09-05 NOTE — Patient Instructions (Signed)
 Clean your incision daily with mild soapy water.  Sitz bath are helpful for wound healing.   Ensure that you thoroughly dry your wound following cleaning and keep dry throughout the day.  You can apply a thin layer of Aquaphor or Vaseline over your incision. Ice packs can be applied to your incision for up to 20 minutes at a time to help with pain and swelling. Take over the counter ibuprofen and over-the-counter Tylenol  as needed.

## 2023-09-12 ENCOUNTER — Other Ambulatory Visit (INDEPENDENT_AMBULATORY_CARE_PROVIDER_SITE_OTHER)

## 2023-09-12 ENCOUNTER — Ambulatory Visit: Admitting: Orthopedic Surgery

## 2023-09-12 DIAGNOSIS — M5416 Radiculopathy, lumbar region: Secondary | ICD-10-CM | POA: Diagnosis not present

## 2023-09-12 LAB — SURGICAL PATHOLOGY

## 2023-09-12 MED ORDER — TRAMADOL HCL 50 MG PO TABS: 50.0000 mg | ORAL_TABLET | Freq: Four times a day (QID) | ORAL | 0 refills | Status: AC | PRN

## 2023-09-12 NOTE — Progress Notes (Signed)
 Orthopedic Spine Surgery Office Note   Assessment: Patient is a 73 y.o. female with low back pain that radiates into the right lower extremity, consistent with radiculopathy.  Patient has stenosis at L3/4 and L4/5     Plan: -Patient has tried Tylenol , Advil, PT, tizanidine , lumbar steroid injection  -Prescribed tramadol  for her acute pain.  Told her to use it sparingly since it is a narcotic -I explained that she has now tried multiple conservative treatments and her remaining options would be pain management or surgery.  Briefly, discussed L3/4, L4/5 XLIF and PSIF to indirectly decompress these areas with stenosis -Patient should return to office on an as needed basis      Patient expressed understanding of the plan and all questions were answered to the patient's satisfaction.    ___________________________________________________________________________     History:   Patient is a 73 y.o. female who presents today for follow-up on her lumbar spine.  Patient comes in today because she had acute worsening of her chronic low back pain.  She said this started about a week ago.  It has gotten better within the last day or 2.  It is not completely gotten better.  She has also noticed pain radiating into the right lateral and posterior thigh.  She has some pain rating into the left buttock as well.  Her pain is more significant in the right lower extremity.  She has not had any bowel or bladder incontinence.  No saddle anesthesia.   Treatments tried: tylenol , advil, PT, tizanidine , lumbar steroid injection     Physical Exam:   General: no acute distress, appears stated age Neurologic: alert, answering questions appropriately, following commands Respiratory: unlabored breathing on room air, symmetric chest rise Psychiatric: appropriate affect, normal cadence to speech     MSK (spine):   -Strength exam                                                   Left                  Right EHL                               5/5                  5/5 TA                                 5/5                  5/5 GSC                             5/5                  5/5 Knee extension            5/5                  5/5 Hip flexion                    5/5  5/5   -Sensory exam                           Sensation intact to light touch in L3-S1 nerve distributions of bilateral lower extremities    Imaging: XRs of the lumbar spine from 09/12/2023 were independently reviewed and interpreted, showing disc height loss at L3/4 and L4/5.  Grade 1 spondylolisthesis seen at L4/5.  No fracture or dislocation seen.   MRI of the lumbar spine from 10/17/2022 was previously independently reviewed and interpreted, showing central stenosis at L3/4.  Central and lateral recess stenosis at L4/5.  Bilateral foraminal stenosis at L4/5.     Patient name: Michelle Hicks Patient MRN: [id number] Date of visit: 09/12/23

## 2023-09-13 ENCOUNTER — Ambulatory Visit: Payer: Self-pay | Admitting: Obstetrics and Gynecology

## 2023-09-13 DIAGNOSIS — L900 Lichen sclerosus et atrophicus: Secondary | ICD-10-CM | POA: Insufficient documentation

## 2023-09-13 DIAGNOSIS — M609 Myositis, unspecified: Secondary | ICD-10-CM | POA: Diagnosis not present

## 2023-09-13 DIAGNOSIS — Z79899 Other long term (current) drug therapy: Secondary | ICD-10-CM | POA: Diagnosis not present

## 2023-09-13 MED ORDER — CLOBETASOL PROPIONATE 0.05 % EX OINT: 1.0000 | TOPICAL_OINTMENT | Freq: Every day | CUTANEOUS | 11 refills | Status: AC

## 2023-10-05 ENCOUNTER — Telehealth: Payer: Self-pay

## 2023-10-05 ENCOUNTER — Telehealth: Payer: Self-pay | Admitting: Pharmacist

## 2023-10-05 DIAGNOSIS — Z79899 Other long term (current) drug therapy: Secondary | ICD-10-CM

## 2023-10-05 NOTE — Telephone Encounter (Signed)
 Refill reorder form faxed to provider's office for review and signature for Ozempic  (Novo Nordisk)

## 2023-10-05 NOTE — Progress Notes (Signed)
 10/05/2023  Patient ID: Michelle Hicks, female   DOB: 02/06/1951, 73 y.o.   MRN: [id number]  Patient was called in reference to Ozempic  refills. HIPAA identifiers were obtained.  Patient said she wondered if she needed to renew her application for Ozempic  and Repatha  for 2026.  For Repatha , she was approved to get funds from [employer] through 01/01/2024.  Renewals for Repatha  cannot be completed with [employer] until 11/28/2023.   [employer] changed the refill process to be manual versus automatic after Patients are approved.    A refill/reorder change form will be completed on the Patient's behalf and sent to [employer] for refill processing.  HgA1c- 6.1%  On Repatha  due to statin allergy- Upcoming appt 12/13/23 will message provider about statin exclusion coding  Telmisartan  80 mg filled 6/25 #90 day supply Amlodipine  5 mg filled 08/20/23 #90 day supply Hydrochlorothiazide  25 mg filled 09/25/23 90 day supply Reviewed Patient's medications:  Medications Reviewed Today     Reviewed by Jolee Cassius PARAS, [facility] (Pharmacist) on 10/05/23 at 1348  Med List Status: <None>   Medication Order Taking? Sig Documenting Provider Last Dose Status Informant  Accu-Chek Softclix Lancets lancets [id number]  USE TO CHECK BLOOD SUGAR TWICE DAILY Jarold Medici, MD  Active   amLODipine  (NORVASC ) 5 MG tablet [id number]  TAKE 1 TABLET(5 MG) BY MOUTH DAILY Raford Riggs, MD  Active   Blood Glucose Monitoring Suppl (ACCU-CHEK GUIDE) w/Device KIT [id number]  Use to check blood sugars twice daily E11.69 Jarold Medici, MD  Active   clobetasol  ointment (TEMOVATE ) 0.05 % [id number]  Apply 1 Application topically daily. Apply 1/2 finger tip amount to the vulva (from clitoris to base of vagina and along labia minora) daily. Hines, Vera V, MD  Active   cyclobenzaprine  (FLEXERIL ) 10 MG tablet [id number]  Take 1 tablet (10 mg total) by mouth 3 (three) times daily as needed for muscle spasms. Georgina Ozell LABOR, MD  Active   diclofenac Sodium (VOLTAREN) 1 % GEL [id number]  Apply topically. [provider]  Active   Evolocumab  (REPATHA  SURECLICK) 140 MG/ML SOAJ [id number]  Inject 140 mg into the skin every 14 (fourteen) days. Raford Riggs, MD  Active   fluticasone [facility]) 50 MCG/ACT nasal spray [id number]  Place 1 spray into both nostrils daily. [provider]  Active Self  folic acid  (FOLVITE ) 1 MG tablet [id number]  Take 1 mg by mouth daily. [provider]  Active   gabapentin  (NEURONTIN ) 100 MG capsule [id number]  Take 1 capsule (100 mg total) by mouth daily. Jarold Medici, MD  Expired 09/05/23 2359   glucose blood (ACCU-CHEK GUIDE TEST) test strip [id number]  USE TO CHECK BLOOD SUGAR TWICE DAILY AS DIRECTED Jarold Medici, MD  Active   hydrochlorothiazide  (HYDRODIURIL ) 25 MG tablet [id number]  TAKE 1 TABLET BY MOUTH DAILY Jarold Medici, MD  Active   hydroxychloroquine (PLAQUENIL) 200 MG tablet [id number]  Take 200 mg by mouth daily. [provider]  Active Self  levothyroxine (SYNTHROID) 50 MCG tablet [id number]  Take 50 mcg by mouth daily.  [provider]  Active Self  lidocaine -EPINEPHrine  (XYLOCAINE  W/EPI) 1 %-1:100000 (with pres) injection 10 mL [id number]   Dallie Vera GAILS, MD  Active   LINZESS 145 MCG CAPS capsule [id number]  Take 145 mcg by mouth every other day. [provider]  Active   methotrexate (RHEUMATREX) 2.5 MG tablet [id number]  Take 10 mg by mouth once a week. [provider]  Active   Multiple  Vitamin (MULTIVITAMIN) tablet [id number]  Take 1 tablet by mouth daily. [provider]  Active Self  predniSONE (DELTASONE) 5 MG tablet [id number]  Take 5 mg by mouth daily. [provider]  Active   Semaglutide , 2 MG/DOSE, (OZEMPIC , 2 MG/DOSE,) 8 MG/3ML SOPN [id number]  Inject 2mg  sq once weekl Sanders, Robyn, MD  Active            Med Note BEN ESTEFANA Kitchens Jan 01, 2023  1:38 PM) 1 mg weekly   sodium chloride  flush (NS) 0.9 % injection 3 mL [id number]   Raford Riggs, MD  Active   telmisartan  (MICARDIS ) 80 MG tablet [id number]  TAKE 1 TABLET BY MOUTH DAILY Jarold Medici, MD  Active   traZODone  (DESYREL ) 50 MG tablet [id number]  Take 1 tablet (50 mg total) by mouth at bedtime. Millikan, Megan, NP  Active   Vitamin D, Cholecalciferol, 25 MCG (1000 UT) CAPS [id number]  Take by mouth. [provider]  Active Self           Plan: Patient will be outreached at the beginning of November for renewal of [employer] funding Ozempic .   Cassius DOROTHA Brought, PharmD, [facility] Clinical Pharmacist [phone]

## 2023-10-05 NOTE — Telephone Encounter (Signed)
-----   Message from [facility] sent at 10/04/2023  3:18 PM EDT ----- Communication Reason for CRM: The patient called in asking to speak with the person that handles pharmacy as she is ready to get her Semaglutide , 2 MG/DOSE, (OZEMPIC , 2 MG/DOSE,) 8 MG/3ML SOPN

## 2023-10-11 ENCOUNTER — Other Ambulatory Visit: Payer: Self-pay | Admitting: Internal Medicine

## 2023-10-15 ENCOUNTER — Other Ambulatory Visit: Payer: Self-pay | Admitting: Internal Medicine

## 2023-10-23 ENCOUNTER — Telehealth: Payer: Self-pay | Admitting: Pharmacist

## 2023-10-23 DIAGNOSIS — E1169 Type 2 diabetes mellitus with other specified complication: Secondary | ICD-10-CM

## 2023-10-23 NOTE — Progress Notes (Signed)
   10/23/2023  Patient ID: Michelle Hicks, female   DOB: 24-Jul-1950, 73 y.o.   MRN: [id number]  Received a message from [place] that the Patient called to inquire about her application from [employer] because she received notice that the form was missing some information.  Upon review, Patient's application was sent back because [employer] said the date was not legible.  The form was corrected and sent out on last 10/19/2023.  Patient was called to let her know the status of things but she did not answer the phone. HIPAA compliant message was left on her voicemail.  Cassius DOROTHA Brought, PharmD, BCACP Clinical Pharmacist [phone]

## 2023-10-23 NOTE — Telephone Encounter (Signed)
-----   Message from [facility] sent at 10/23/2023 12:22 PM EDT ----- Communication Reason for CRM: patient called stated she recvd a letter saying they could not process her application for Ozempic  per they are missing information from her health care provider. Patient said the letter was dated for 9/1. Please f/u with patient

## 2023-10-24 ENCOUNTER — Telehealth: Payer: Self-pay | Admitting: Pharmacist

## 2023-10-24 DIAGNOSIS — E1165 Type 2 diabetes mellitus with hyperglycemia: Secondary | ICD-10-CM

## 2023-10-24 NOTE — Progress Notes (Signed)
 10/24/2023  Patient ID: Michelle Hicks, female   DOB: Sep 27, 1950, 73 y.o.   MRN: [id number]  Patient called me back from last week. HIPAA identifiers were obtained. It was explained to her that her application had been resubmitted because [employer] could not read the date.   Reviewed her medications while we were on the phone:  Medications Reviewed Today     Reviewed by Jolee Cassius PARAS, [facility] (Pharmacist) on 10/24/23 at 1413  Med List Status: <None>   Medication Order Taking? Sig Documenting Provider Last Dose Status Informant  ACCU-CHEK GUIDE TEST test strip [id number] Yes USE TO CHECK BLOOD SUGAR TWICE DAILY AS DIRECTED Jarold Medici, MD  Active   Accu-Chek Softclix Lancets lancets [id number] Yes USE TO CHECK BLOOD SUGAR TWICE DAILY Jarold Medici, MD  Active   amLODipine  (NORVASC ) 5 MG tablet [id number] Yes TAKE 1 TABLET(5 MG) BY MOUTH DAILY Raford Riggs, MD  Active   Blood Glucose Monitoring Suppl (ACCU-CHEK GUIDE) w/Device KIT [id number] Yes Use to check blood sugars twice daily E11.69 Jarold Medici, MD  Active   clobetasol  ointment (TEMOVATE ) 0.05 % [id number]  Apply 1 Application topically daily. Apply 1/2 finger tip amount to the vulva (from clitoris to base of vagina and along labia minora) daily. Dallie Bollard V, MD  Active   cyclobenzaprine  (FLEXERIL ) 10 MG tablet [id number] Yes Take 1 tablet (10 mg total) by mouth 3 (three) times daily as needed for muscle spasms. Georgina Ozell LABOR, MD  Active   diclofenac Sodium (VOLTAREN) 1 % GEL [id number] Yes Apply topically. [provider]  Active   Evolocumab  (REPATHA  SURECLICK) 140 MG/ML SOAJ [id number] Yes Inject 140 mg into the skin every 14 (fourteen) days. Raford Riggs, MD  Active   fluticasone [facility]) 50 MCG/ACT nasal spray [id number] Yes Place 1 spray into both nostrils daily. [provider]  Active Self  folic acid  (FOLVITE ) 1 MG tablet [id number] Yes Take 1 mg by mouth daily. [provider]  Active    gabapentin  (NEURONTIN ) 100 MG capsule [id number] Yes Take 1 capsule (100 mg total) by mouth daily. Jarold Medici, MD  Active            Med Note MACK, CASSIUS PARAS   Wed Oct 24, 2023  2:11 PM) AS NEEDED  hydrochlorothiazide  (HYDRODIURIL ) 25 MG tablet [id number] Yes TAKE 1 TABLET BY MOUTH DAILY Jarold Medici, MD  Active   hydroxychloroquine (PLAQUENIL) 200 MG tablet [id number] Yes Take 200 mg by mouth daily. [provider]  Active Self  levothyroxine (SYNTHROID) 50 MCG tablet [id number] Yes Take 50 mcg by mouth daily.  [provider]  Active Self  lidocaine -EPINEPHrine  (XYLOCAINE  W/EPI) 1 %-1:100000 (with pres) injection 10 mL [id number]   Dallie Bollard GAILS, MD  Active   LINZESS 145 MCG CAPS capsule [id number] Yes Take 145 mcg by mouth every other day.  Patient taking differently: Take 145 mcg by mouth as needed.   [provider]  Active            Med Note MACK, CASSIUS PARAS   Wed Oct 24, 2023  2:11 PM) PRN  methotrexate (RHEUMATREX) 2.5 MG tablet [id number] Yes Take 10 mg by mouth once a week. [provider]  Active   Multiple Vitamin (MULTIVITAMIN) tablet [id number] Yes Take 1 tablet by mouth daily. [provider]  Active Self  predniSONE (DELTASONE) 5 MG tablet [id number] Yes Take 5 mg by mouth daily.  Patient taking differently: Take 5 mg by mouth every other  day.   [provider]  Active   Semaglutide , 2 MG/DOSE, (OZEMPIC , 2 MG/DOSE,) 8 MG/3ML SOPN [id number] Yes Inject 2mg  sq once geraldene Jarold Medici, MD  Active            Med Note BEN ESTEFANA Kitchens Jan 01, 2023  1:38 PM) 1 mg weekly  sodium chloride  flush (NS) 0.9 % injection 3 mL [id number]   Raford Riggs, MD  Active   telmisartan  (MICARDIS ) 80 MG tablet [id number] Yes TAKE 1 TABLET BY MOUTH DAILY Jarold Medici, MD  Active   traZODone  (DESYREL ) 50 MG tablet [id number] Yes Take 1 tablet (50 mg total) by mouth at bedtime.  Patient taking differently: Take 50 mg by mouth at bedtime  as needed.   Millikan, Megan, NP  Active   Vitamin D, Cholecalciferol, 25 MCG (1000 UT) CAPS [id number] Yes Take by mouth. [provider]  Active Self           A1c-6.1% Not on a statin due to statin intolerance. Upcoming appointment with Dr. Jarold on 12/13/23-will send delayed note to request coding for statin exclusion during the visit.  On Repatha --gets it through [facility]. Her grand runs out 12/01/23- will attempt to renew [employer] when I re-do Ozempic  for next year.    Cassius DOROTHA Brought, PharmD, BCACP Clinical Pharmacist [phone]    Plan;

## 2023-11-06 ENCOUNTER — Other Ambulatory Visit: Payer: Self-pay | Admitting: Internal Medicine

## 2023-12-13 ENCOUNTER — Ambulatory Visit: Admitting: Internal Medicine

## 2023-12-13 ENCOUNTER — Encounter: Payer: Self-pay | Admitting: Internal Medicine

## 2023-12-13 VITALS — BP 110/72 | HR 80 | Temp 98.4°F | Ht 65.0 in | Wt 237.4 lb

## 2023-12-13 DIAGNOSIS — E785 Hyperlipidemia, unspecified: Secondary | ICD-10-CM

## 2023-12-13 DIAGNOSIS — E1169 Type 2 diabetes mellitus with other specified complication: Secondary | ICD-10-CM | POA: Diagnosis not present

## 2023-12-13 DIAGNOSIS — I119 Hypertensive heart disease without heart failure: Secondary | ICD-10-CM

## 2023-12-13 DIAGNOSIS — I700 Atherosclerosis of aorta: Secondary | ICD-10-CM

## 2023-12-13 DIAGNOSIS — T466X5A Adverse effect of antihyperlipidemic and antiarteriosclerotic drugs, initial encounter: Secondary | ICD-10-CM

## 2023-12-13 DIAGNOSIS — E2839 Other primary ovarian failure: Secondary | ICD-10-CM

## 2023-12-13 DIAGNOSIS — E039 Hypothyroidism, unspecified: Secondary | ICD-10-CM

## 2023-12-13 DIAGNOSIS — G720 Drug-induced myopathy: Secondary | ICD-10-CM

## 2023-12-13 DIAGNOSIS — D649 Anemia, unspecified: Secondary | ICD-10-CM

## 2023-12-13 DIAGNOSIS — G4733 Obstructive sleep apnea (adult) (pediatric): Secondary | ICD-10-CM

## 2023-12-13 NOTE — Patient Instructions (Signed)
 Hypertension, Adult Hypertension is another name for high blood pressure. High blood pressure forces your heart to work harder to pump blood. This can cause problems over time. There are two numbers in a blood pressure reading. There is a top number (systolic) over a bottom number (diastolic). It is best to have a blood pressure that is below 120/80. What are the causes? The cause of this condition is not known. Some other conditions can lead to high blood pressure. What increases the risk? Some lifestyle factors can make you more likely to develop high blood pressure: Smoking. Not getting enough exercise or physical activity. Being overweight. Having too much fat, sugar, calories, or salt (sodium) in your diet. Drinking too much alcohol. Other risk factors include: Having any of these conditions: Heart disease. Diabetes. High cholesterol. Kidney disease. Obstructive sleep apnea. Having a family history of high blood pressure and high cholesterol. Age. The risk increases with age. Stress. What are the signs or symptoms? High blood pressure may not cause symptoms. Very high blood pressure (hypertensive crisis) may cause: Headache. Fast or uneven heartbeats (palpitations). Shortness of breath. Nosebleed. Vomiting or feeling like you may vomit (nauseous). Changes in how you see. Very bad chest pain. Feeling dizzy. Seizures. How is this treated? This condition is treated by making healthy lifestyle changes, such as: Eating healthy foods. Exercising more. Drinking less alcohol. Your doctor may prescribe medicine if lifestyle changes do not help enough and if: Your top number is above 130. Your bottom number is above 80. Your personal target blood pressure may vary. Follow these instructions at home: Eating and drinking  If told, follow the DASH eating plan. To follow this plan: Fill one half of your plate at each meal with fruits and vegetables. Fill one fourth of your plate  at each meal with whole grains. Whole grains include whole-wheat pasta, brown rice, and whole-grain bread. Eat or drink low-fat dairy products, such as skim milk or low-fat yogurt. Fill one fourth of your plate at each meal with low-fat (lean) proteins. Low-fat proteins include fish, chicken without skin, eggs, beans, and tofu. Avoid fatty meat, cured and processed meat, or chicken with skin. Avoid pre-made or processed food. Limit the amount of salt in your diet to less than 1,500 mg each day. Do not drink alcohol if: Your doctor tells you not to drink. You are pregnant, may be pregnant, or are planning to become pregnant. If you drink alcohol: Limit how much you have to: 0-1 drink a day for women. 0-2 drinks a day for men. Know how much alcohol is in your drink. In the [place] one drink equals one 12 oz bottle of beer (355 mL), one 5 oz glass of wine (148 mL), or one 1 oz glass of hard liquor (44 mL). Lifestyle  Work with your doctor to stay at a healthy weight or to lose weight. Ask your doctor what the best weight is for you. Get at least 30 minutes of exercise that causes your heart to beat faster (aerobic exercise) most days of the week. This may include walking, swimming, or biking. Get at least 30 minutes of exercise that strengthens your muscles (resistance exercise) at least 3 days a week. This may include lifting weights or doing Pilates. Do not smoke or use any products that contain nicotine or tobacco. If you need help quitting, ask your doctor. Check your blood pressure at home as told by your doctor. Keep all follow-up visits. Medicines Take over-the-counter and prescription medicines  only as told by your doctor. Follow directions carefully. Do not skip doses of blood pressure medicine. The medicine does not work as well if you skip doses. Skipping doses also puts you at risk for problems. Ask your doctor about side effects or reactions to medicines that you should watch  for. Contact a doctor if: You think you are having a reaction to the medicine you are taking. You have headaches that keep coming back. You feel dizzy. You have swelling in your ankles. You have trouble with your vision. Get help right away if: You get a very bad headache. You start to feel mixed up (confused). You feel weak or numb. You feel faint. You have very bad pain in your: Chest. Belly (abdomen). You vomit more than once. You have trouble breathing. These symptoms may be an emergency. Get help right away. Call 911. Do not wait to see if the symptoms will go away. Do not drive yourself to the hospital. Summary Hypertension is another name for high blood pressure. High blood pressure forces your heart to work harder to pump blood. For most people, a normal blood pressure is less than 120/80. Making healthy choices can help lower blood pressure. If your blood pressure does not get lower with healthy choices, you may need to take medicine. This information is not intended to replace advice given to you by your health care provider. Make sure you discuss any questions you have with your health care provider. Document Revised: 11/18/2020 Document Reviewed: 11/18/2020 Elsevier Patient Education  2024 [employer].

## 2023-12-13 NOTE — Progress Notes (Signed)
 I,Michelle Hicks, CMA,acting as a neurosurgeon for Michelle LOISE Slocumb, MD.,have documented all relevant documentation on the behalf of Michelle LOISE Slocumb, MD,as directed by  Michelle LOISE Slocumb, MD while in the presence of Michelle LOISE Slocumb, MD.  Subjective:  Patient ID: Michelle Hicks , female    DOB: [birth year] , 73 y.o.   MRN: [id number]  Chief Complaint  Patient presents with   Hypertension    Patient presents today for bp & dm & thyroid  follow up. She reports compliance with medications. Denies headache, chest pain & sob. She asks, since receiving covid vaccine in June of this year. To get the newest should she wait or is she okay to get the vaccine?    Diabetes   Hypothyroidism    HPI Discussed the use of AI scribe software for clinical note transcription with the patient, who gave verbal consent to proceed.  History of Present Illness Michelle Hicks is a 73 year old female with diabetes and hypertension who presents for a routine follow-up.  Her blood sugar levels have been stable, with morning readings under 100 mg/dL. She is currently taking Ozempic  2 mg, which she receives through patient assistance, but this is ending soon. She has previously purchased it. She is not exercising due to pain, which prevents her from engaging in activities like water aerobics or gardening.  Her hypertension is treated with amlodipine  5 mg daily, telmisartan  80 mg, and hydrochlorothiazide  25 mg daily. She also takes Repatha , and her cholesterol levels were noted to be good earlier this year.  She experiences pain and stiffness, attributed to her back issues, including stenosis. She has tried an injection in her back previously, which was very painful and did not help. She takes gabapentin  as needed for pain, and methotrexate, prednisone, and hydroxychloroquine for inflammation. Her methotrexate dose was decreased to four tablets weekly. She also takes folic acid  5 mg every other day.  She has a history of  anemia with a slightly low blood count earlier this year. Her iron panel in June was normal. No blood in her stool, although she notes it is dark. She has previously used stool cards for testing.  She takes Synthroid 50 mcg and levothyroxine for thyroid  management. She uses Flonase and takes Linzess as needed. She also takes trazodone  for sleep as needed, although she prefers not to take pills regularly.   Diabetes She presents for her follow-up diabetic visit. She has type 2 diabetes mellitus. Her disease course has been stable. There are no hypoglycemic associated symptoms. There are no diabetic associated symptoms. Pertinent negatives for diabetes include no blurred vision, no polydipsia, no polyphagia and no polyuria. There are no hypoglycemic complications. Symptoms are stable. Risk factors for coronary artery disease include diabetes mellitus, obesity, sedentary lifestyle and post-menopausal. She is compliant with treatment most of the time. She is following a generally healthy diet. She participates in exercise intermittently. An ACE inhibitor/angiotensin II receptor blocker is being taken. Eye exam is current.  Hypertension This is a chronic problem. The current episode started more than 1 year ago. The problem has been gradually improving since onset. The problem is controlled. Pertinent negatives include no blurred vision or shortness of breath. The current treatment provides moderate improvement.  Hyperlipidemia This is a chronic problem. The current episode started more than 1 year ago. Exacerbating diseases include obesity. Pertinent negatives include no shortness of breath. Current antihyperlipidemic treatment includes exercise and diet change. The current treatment provides moderate improvement of  lipids. Compliance problems include adherence to exercise.  Risk factors for coronary artery disease include diabetes mellitus, dyslipidemia, hypertension, obesity and post-menopausal.     Past  Medical History:  Diagnosis Date   Allergy 2018   Arthritis 2015   Depression    DM (diabetes mellitus) (HCC)    Exertional dyspnea 05/30/2021   GERD (gastroesophageal reflux disease) 2006   Heart murmur 1998   not addressed since   HTN (hypertension)    Neuromuscular disorder (HCC) 2022   Palpitations 01/19/2022   Pericardial calcification 07/12/2021   Sleep apnea    Tachycardia 05/30/2021     Family History  Problem Relation Age of Onset   Diabetes Mother    Pancreatic cancer Mother    Cancer Mother    Obesity Mother    Hypertension Father    Heart attack Father 50   Heart disease Father    Arthritis Paternal Aunt    Arthritis Paternal Aunt    Diabetes Maternal Grandmother    Breast cancer Neg Hx    Sleep apnea Neg Hx      Current Outpatient Medications:    ACCU-CHEK GUIDE TEST test strip, USE TO CHECK BLOOD SUGAR TWICE DAILY AS DIRECTED, Disp: 200 strip, Rfl: 1   Accu-Chek Softclix Lancets lancets, USE TO CHECK BLOOD SUGAR TWICE DAILY, Disp: 200 each, Rfl: 3   amLODipine  (NORVASC ) 5 MG tablet, TAKE 1 TABLET(5 MG) BY MOUTH DAILY, Disp: 90 tablet, Rfl: 3   Blood Glucose Monitoring Suppl (ACCU-CHEK GUIDE) w/Device KIT, Use to check blood sugars twice daily E11.69, Disp: 1 kit, Rfl: 1   clobetasol  ointment (TEMOVATE ) 0.05 %, Apply 1 Application topically daily. Apply 1/2 finger tip amount to the vulva (from clitoris to base of vagina and along labia minora) daily., Disp: 60 g, Rfl: 11   cyclobenzaprine  (FLEXERIL ) 10 MG tablet, Take 1 tablet (10 mg total) by mouth 3 (three) times daily as needed for muscle spasms., Disp: 30 tablet, Rfl: 0   diclofenac Sodium (VOLTAREN) 1 % GEL, Apply topically., Disp: , Rfl:    Evolocumab  (REPATHA  SURECLICK) 140 MG/ML SOAJ, Inject 140 mg into the skin every 14 (fourteen) days., Disp: 6 mL, Rfl: 3   fluticasone (FLONASE) 50 MCG/ACT nasal spray, Place 1 spray into both nostrils daily., Disp: , Rfl:    folic acid  (FOLVITE ) 1 MG tablet, Take 1  mg by mouth daily., Disp: , Rfl:    gabapentin  (NEURONTIN ) 100 MG capsule, Take 1 capsule (100 mg total) by mouth daily., Disp: 30 capsule, Rfl: 2   hydrochlorothiazide  (HYDRODIURIL ) 25 MG tablet, TAKE 1 TABLET BY MOUTH DAILY, Disp: 100 tablet, Rfl: 2   hydroxychloroquine (PLAQUENIL) 200 MG tablet, Take 200 mg by mouth daily., Disp: , Rfl:    levothyroxine (SYNTHROID) 50 MCG tablet, Take 50 mcg by mouth daily. , Disp: , Rfl:    LINZESS 145 MCG CAPS capsule, Take 145 mcg by mouth every other day. (Patient taking differently: Take 145 mcg by mouth as needed.), Disp: , Rfl:    methotrexate (RHEUMATREX) 2.5 MG tablet, Take 10 mg by mouth once a week., Disp: , Rfl:    Multiple Vitamin (MULTIVITAMIN) tablet, Take 1 tablet by mouth daily., Disp: , Rfl:    predniSONE (DELTASONE) 5 MG tablet, Take 5 mg by mouth daily. (Patient taking differently: Take 5 mg by mouth every other day.), Disp: , Rfl:    Semaglutide , 2 MG/DOSE, (OZEMPIC , 2 MG/DOSE,) 8 MG/3ML SOPN, Inject 2mg  sq once weekl, Disp: 9 mL, Rfl:  2   telmisartan  (MICARDIS ) 80 MG tablet, TAKE 1 TABLET BY MOUTH DAILY, Disp: 100 tablet, Rfl: 2   traZODone  (DESYREL ) 50 MG tablet, Take 1 tablet (50 mg total) by mouth at bedtime. (Patient taking differently: Take 50 mg by mouth at bedtime as needed.), Disp: 90 tablet, Rfl: 1   Vitamin D, Cholecalciferol, 25 MCG (1000 UT) CAPS, Take by mouth., Disp: , Rfl:   Current Facility-Administered Medications:    lidocaine -EPINEPHrine  (XYLOCAINE  W/EPI) 1 %-1:100000 (with pres) injection 10 mL, 10 mL, Infiltration, Once,    sodium chloride  flush (NS) 0.9 % injection 3 mL, 3 mL, Intravenous, Q12H, Raford Riggs, MD   Allergies  Allergen Reactions   Statins     CK elevation   Sulfa Antibiotics Rash   Sulfamethoxazole-Trimethoprim Rash     Review of Systems  Constitutional: Negative.   Eyes:  Negative for blurred vision.  Respiratory: Negative.  Negative for shortness of breath.   Cardiovascular: Negative.    Gastrointestinal: Negative.   Endocrine: Negative for polydipsia, polyphagia and polyuria.  Musculoskeletal:  Positive for back pain.  Neurological: Negative.   Psychiatric/Behavioral: Negative.       Today's Vitals   12/13/23 1024  BP: 110/72  Pulse: 80  Temp: 98.4 F (36.9 C)  SpO2: 98%  Weight: 237 lb 6.4 oz (107.7 kg)  Height: 5' 5 (1.651 m)   Body mass index is 39.51 kg/m.  Wt Readings from Last 3 Encounters:  12/13/23 237 lb 6.4 oz (107.7 kg)  09/05/23 237 lb (107.5 kg)  08/09/23 236 lb (107 kg)     Objective:  Physical Exam Vitals and nursing note reviewed.  Constitutional:      Appearance: Normal appearance. She is obese.  HENT:     Head: Normocephalic and atraumatic.  Eyes:     Extraocular Movements: Extraocular movements intact.  Cardiovascular:     Rate and Rhythm: Normal rate and regular rhythm.     Heart sounds: Normal heart sounds.  Pulmonary:     Effort: Pulmonary effort is normal.     Breath sounds: Normal breath sounds.  Skin:    General: Skin is warm.  Neurological:     General: No focal deficit present.     Mental Status: She is alert.  Psychiatric:        Mood and Affect: Mood normal.        Behavior: Behavior normal.         Assessment And Plan:  Hypertensive heart disease without heart failure Assessment & Plan: Chronic, controlled.  She will continue with amlodipine  5mg  daily, telmisartan  80mg  daily and hydrochlorothiazide  25mg  daily. She is encouraged to follow low sodium diet.  -Advised evening dosing of amlodipine  to prevent nocturnal peaks.  Orders: -     CMP14+EGFR  Aortic atherosclerosis Assessment & Plan: Chronic, not on statin therapy due to statin myopathy. LDL goal is less than 70.  She will continue with Repatha .    Dyslipidemia associated with type 2 diabetes mellitus (HCC) Assessment & Plan: Chronic, she will continue with Ozempic  2mg  weekly. She will f/u in four months.  - Importance of dietary/medication  compliance was discussed with the patient. - Gradually increase daily activity.  Orders: -     CBC -     CMP14+EGFR -     Hemoglobin A1c  Normocytic anemia Assessment & Plan: Suspected anemia of chronic disease. Previous iron panel normal. Stool dark, no reported blood. - Recheck blood count. - Check iron level. - Provide stool  cards for fecal occult blood testing.  Orders: -     CBC -     Iron, TIBC and Ferritin Panel  Estrogen deficiency -     DG Bone Density; Future  Statin myopathy    Return for 4 MONTH DM F/U.SABRA  Patient was given opportunity to ask questions. Patient verbalized understanding of the plan and was able to repeat key elements of the plan. All questions were answered to their satisfaction.   I, Michelle LOISE Slocumb, MD, have reviewed all documentation for this visit. The documentation on 12/13/23 for the exam, diagnosis, procedures, and orders are all accurate and complete.   IF YOU HAVE BEEN REFERRED TO A SPECIALIST, IT MAY TAKE 1-2 WEEKS TO SCHEDULE/PROCESS THE REFERRAL. IF YOU HAVE NOT HEARD FROM US /SPECIALIST IN TWO WEEKS, PLEASE GIVE US  A CALL AT [phone].   THE PATIENT IS ENCOURAGED TO PRACTICE SOCIAL DISTANCING DUE TO THE COVID-19 PANDEMIC.

## 2023-12-13 NOTE — Assessment & Plan Note (Signed)
 Chronic, not on statin therapy due to statin myopathy. LDL goal is less than 70.  She will continue with Repatha.

## 2023-12-14 LAB — HEMOGLOBIN A1C
Est. average glucose Bld gHb Est-mCnc: 137 mg/dL
Hgb A1c MFr Bld: 6.4 % — ABNORMAL HIGH (ref 4.8–5.6)

## 2023-12-14 LAB — IRON,TIBC AND FERRITIN PANEL
Ferritin: 86 ng/mL (ref 15–150)
Iron Saturation: 32 % (ref 15–55)
Iron: 99 ug/dL (ref 27–139)
Total Iron Binding Capacity: 313 ug/dL (ref 250–450)
UIBC: 214 ug/dL (ref 118–369)

## 2023-12-14 LAB — CBC
Hematocrit: 32.7 % — ABNORMAL LOW (ref 34.0–46.6)
Hemoglobin: 10.7 g/dL — ABNORMAL LOW (ref 11.1–15.9)
MCH: 29.2 pg (ref 26.6–33.0)
MCHC: 32.7 g/dL (ref 31.5–35.7)
MCV: 89 fL (ref 79–97)
Platelets: 285 x10E3/uL (ref 150–450)
RBC: 3.67 x10E6/uL — ABNORMAL LOW (ref 3.77–5.28)
RDW: 12.9 % (ref 11.7–15.4)
WBC: 6.4 x10E3/uL (ref 3.4–10.8)

## 2023-12-14 LAB — CMP14+EGFR
ALT: 32 IU/L (ref 0–32)
AST: 36 IU/L (ref 0–40)
Albumin: 4.3 g/dL (ref 3.8–4.8)
Alkaline Phosphatase: 63 IU/L (ref 49–135)
BUN/Creatinine Ratio: 15 (ref 12–28)
BUN: 14 mg/dL (ref 8–27)
Bilirubin Total: 0.3 mg/dL (ref 0.0–1.2)
CO2: 23 mmol/L (ref 20–29)
Calcium: 10 mg/dL (ref 8.7–10.3)
Chloride: 104 mmol/L (ref 96–106)
Creatinine, Ser: 0.95 mg/dL (ref 0.57–1.00)
Globulin, Total: 3 g/dL (ref 1.5–4.5)
Glucose: 89 mg/dL (ref 70–99)
Potassium: 4.4 mmol/L (ref 3.5–5.2)
Sodium: 140 mmol/L (ref 134–144)
Total Protein: 7.3 g/dL (ref 6.0–8.5)
eGFR: 64 mL/min/1.73 (ref >59)

## 2023-12-16 ENCOUNTER — Other Ambulatory Visit: Payer: Self-pay | Admitting: Cardiovascular Disease

## 2023-12-16 DIAGNOSIS — E785 Hyperlipidemia, unspecified: Secondary | ICD-10-CM

## 2023-12-16 DIAGNOSIS — I318 Other specified diseases of pericardium: Secondary | ICD-10-CM

## 2023-12-16 DIAGNOSIS — E7800 Pure hypercholesterolemia, unspecified: Secondary | ICD-10-CM

## 2023-12-16 DIAGNOSIS — I700 Atherosclerosis of aorta: Secondary | ICD-10-CM

## 2023-12-16 NOTE — Assessment & Plan Note (Signed)
 Chronic, controlled.  She will continue with amlodipine  5mg  daily, telmisartan  80mg  daily and hydrochlorothiazide  25mg  daily. She is encouraged to follow low sodium diet.  -Advised evening dosing of amlodipine  to prevent nocturnal peaks.

## 2023-12-16 NOTE — Assessment & Plan Note (Signed)
 Chronic, she will continue with Ozempic  2mg  weekly. She will f/u in four months.  - Importance of dietary/medication compliance was discussed with the patient. - Gradually increase daily activity.

## 2023-12-16 NOTE — Assessment & Plan Note (Signed)
 Suspected anemia of chronic disease. Previous iron panel normal. Stool dark, no reported blood. - Recheck blood count. - Check iron level. - Provide stool cards for fecal occult blood testing.

## 2023-12-17 ENCOUNTER — Ambulatory Visit: Payer: Self-pay | Admitting: Internal Medicine

## 2023-12-17 ENCOUNTER — Encounter: Payer: Self-pay | Admitting: Radiology

## 2023-12-26 ENCOUNTER — Telehealth: Payer: Self-pay | Admitting: Pharmacist

## 2023-12-26 DIAGNOSIS — E1169 Type 2 diabetes mellitus with other specified complication: Secondary | ICD-10-CM

## 2023-12-26 NOTE — Progress Notes (Signed)
   12/26/2023  Patient ID: Michelle Hicks, female   DOB: [birth year], 73 y.o.   MRN: [id number]  Reviewed chart. G72.0 was associated with the December 13, 2023 office visit. This should exclude the Patient from the measure.  Patient was called to be sure she has her [employer] ID for 2026. HIPAA identifiers were obtained.  Patient said she did not receive anything from [employer]. She will be sent the information on MyChart   [employer] ID [id number]   [facility] information: [id number] Card Status Active BIN [id number] PCN [id number] [facility] Group [id number] Help Desk [phone] Provider PDMI Processor PDMI     Michelle Hicks, PharmD, [facility] Clinical Pharmacist [phone]

## 2024-01-07 ENCOUNTER — Other Ambulatory Visit: Payer: Self-pay | Admitting: Internal Medicine

## 2024-01-21 LAB — OPHTHALMOLOGY REPORT-SCANNED

## 2024-02-05 ENCOUNTER — Ambulatory Visit (HOSPITAL_BASED_OUTPATIENT_CLINIC_OR_DEPARTMENT_OTHER): Admitting: Cardiovascular Disease

## 2024-02-05 ENCOUNTER — Encounter (HOSPITAL_BASED_OUTPATIENT_CLINIC_OR_DEPARTMENT_OTHER): Payer: Self-pay | Admitting: Cardiovascular Disease

## 2024-02-05 VITALS — BP 108/60 | HR 95 | Ht 65.0 in | Wt 239.2 lb

## 2024-02-05 DIAGNOSIS — E785 Hyperlipidemia, unspecified: Secondary | ICD-10-CM

## 2024-02-05 DIAGNOSIS — E1169 Type 2 diabetes mellitus with other specified complication: Secondary | ICD-10-CM

## 2024-02-05 DIAGNOSIS — I700 Atherosclerosis of aorta: Secondary | ICD-10-CM | POA: Diagnosis not present

## 2024-02-05 DIAGNOSIS — I10 Essential (primary) hypertension: Secondary | ICD-10-CM

## 2024-02-05 MED ORDER — HYDROCHLOROTHIAZIDE 12.5 MG PO TABS: 12.5000 mg | ORAL_TABLET | Freq: Every day | ORAL | 3 refills | Status: AC

## 2024-02-05 NOTE — Progress Notes (Signed)
 " Cardiology Office Note:  .   Date:  02/05/2024  ID:  Michelle Hicks, DOB Jun 21, 1950, MRN [id number] PCP: Jarold Medici, MD  [facility] Providers Cardiologist:  Annabella Scarce, MD    History of Present Illness: .    Michelle Hicks is a 73 y.o. female with a hx of hypertension, diabetes mellitus, OSA, polymyositis, and depression, here for follow-up of frequent heart palpitations. She saw Dr. Jarold 03/2021. She was referred for an echocardiogram 05/2021 that revealed LVEF 60 to 65% with mild LVH and moderate hypertrophy of the basal septum.  She had normal diastolic function.  She had a coronary CTA 05/2021 that revealed normal coronary arteries.  However there is some thickening and calcification of her pericardium.  There was concern for elevated pulmonary pressures.  The study showed signs of GERD and a small pulmonary nodule.  She has had some issues with muscle stiffness but did not improve after stopping her statin. She had a left and right heart cath 07/2021, that revealed normal coronaries and no evidence of constriction.     At her visit 06/2021 she was generally feeling fine other than some acid reflux. She also reported a pre-syncopal episode of low blood pressure. She had lost 10 lbs and planned to continue with her weight loss.  At her visit 01/2022 she noted that she was feeling more tired and had some episodes of tachycardia at rest.  She wore a 30-day monitor that revealed rare PACs and PVCs and 7 beats of NSVT.  She follow-up with Reche Finder, NP and had some low blood pressures but no symptoms.  She was given metoprolol  to take as needed because she was not having many palpitations at the time.  Echo 05/2022 revealed LVEF 60-65% with mild LVH and grade 1 diastolic dysfunction.  She saw Reche again 06/2022 and was feeling well.   At her appointment 01/2023 she struggled with back pain that limited her physical activity.  She has previously participated in the PREP program  at the [facility], which she found beneficial, but has had to stop due to back pain and a couple of falls.  She was referred to the [facility] program.    Discussed the use of AI scribe software for clinical note transcription with the patient, who gave verbal consent to proceed.  History of Present Illness Michelle Hicks experiences persistent stiffness and pain despite adherence to her medication regimen for polymyositis. She feels the medication is not alleviating her symptoms and is hesitant to increase the dosage. Physical activity, such as walking a block or using an exercise bike, exacerbates the pain, particularly in her knees.  She experiences shortness of breath primarily when on her feet, which she attributes to being out of shape. She attempts to maintain muscle strength through movement but avoids walking long distances due to pain.  She has noticed sock imprints on her legs, a new observation within the past year, but does not believe it is due to swelling. Her blood pressure is well-controlled at home, with readings typically around 120-130/70-80 mmHg. She experiences lightheadedness infrequently, about twice a year.  She takes amlodipine  in the evening and questions if it affects her nighttime urination. No significant swelling in her legs, only noticing sock imprints.  ROS:  As per HPI  Studies Reviewed: .       Echo 05/2022:  1. Left ventricular ejection fraction, by estimation, is 60 to 65%. The  left ventricle has normal function. The  left ventricle has no regional  wall motion abnormalities. There is mild left ventricular hypertrophy.  Left ventricular diastolic parameters  are consistent with Grade I diastolic dysfunction (impaired relaxation).   2. Right ventricular systolic function is normal. The right ventricular  size is normal. Tricuspid regurgitation signal is inadequate for assessing  PA pressure.   3. The mitral valve is normal in structure. No evidence of mitral  valve  regurgitation. No evidence of mitral stenosis.   4. The aortic valve was not well visualized. Aortic valve regurgitation  is not visualized. Aortic valve sclerosis/calcification is present,  without any evidence of aortic stenosis.   5. The inferior vena cava is normal in size with greater than 50%  respiratory variability, suggesting right atrial pressure of 3 mmHg.   6. No evidence of constrictive pericarditis   Risk Assessment/Calculations:             Physical Exam:   VS:  BP 108/60   Pulse 95   Ht 5' 5 (1.651 m)   Wt 239 lb 3.2 oz (108.5 kg)   SpO2 96%   BMI 39.80 kg/m  , BMI Body mass index is 39.8 kg/m. GENERAL:  Well appearing HEENT: Pupils equal round and reactive, fundi not visualized, oral mucosa unremarkable NECK:  No jugular venous distention, waveform within normal limits, carotid upstroke brisk and symmetric, no bruits, no thyromegaly LUNGS:  Clear to auscultation bilaterally HEART:  RRR.  PMI not displaced or sustained,S1 and S2 within normal limits, no S3, no S4, no clicks, no rubs, no murmurs ABD:  Flat, positive bowel sounds normal in frequency in pitch, no bruits, no rebound, no guarding, no midline pulsatile mass, no hepatomegaly, no splenomegaly EXT:  2 plus pulses throughout, no edema, no cyanosis no clubbing SKIN:  No rashes no nodules NEURO:  Cranial nerves II through XII grossly intact, motor grossly intact throughout PSYCH:  Cognitively intact, oriented to person place and time   ASSESSMENT AND PLAN: .    Assessment & Plan # Polymyositis Persistent stiffness and pain despite current treatment. No significant improvement with current medication regimen. Concerns about increasing medication dosage due to potential side effects.  # Hypertension Blood pressure is well controlled with current medication regimen. Occasional lightheadedness reported, but not consistent. - Reduced hydrochlorothiazide  dosage to 12.5 mg to decrease nocturia. -  Continue amlodipine  and telmisartan . - Continue monitoring blood pressure at home to ensure it remains under 130/80 mmHg.  # Type 2 diabetes mellitus A1c is 6.4, indicating good control. - Continue current diabetes management with Ozempic .  # Hyperlipidemia Cholesterol levels are well controlled with Repatha . - Continue current hyperlipidemia management.   Dispo: f/u 1 year  Signed, Annabella Scarce, MD   "

## 2024-02-05 NOTE — Patient Instructions (Signed)
 Medication Instructions:  DECREASE hydrochlorothiazide  TO 12.5 MG DAILY   *If you need a refill on your cardiac medications before your next appointment, please call your pharmacy*  Lab Work: NONE  Testing/Procedures: NONE  Follow-Up: At [facility], you and your health needs are our priority.  As part of our continuing mission to provide you with exceptional heart care, our providers are all part of one team.  This team includes your primary Cardiologist (physician) and Advanced Practice Providers or APPs (Physician Assistants and Nurse Practitioners) who all work together to provide you with the care you need, when you need it.  Your next appointment:   12 month(s)  Provider:   Annabella Scarce, MD, Rosaline Bane, NP, or Reche Finder, NP   We recommend signing up for the patient portal called MyChart.  Sign up information is provided on this After Visit Summary.  MyChart is used to connect with patients for Virtual Visits (Telemedicine).  Patients are able to view lab/test results, encounter notes, upcoming appointments, etc.  Non-urgent messages can be sent to your provider as well.   To learn more about what you can do with MyChart, go to [email or web address].   Other Instructions IF YOUR BLOOD PRESSURE IS NOT CONSISTENTLY BELOW 130/80 CALL THE OFFICE

## 2024-02-11 LAB — HM DEXA SCAN: HM Dexa Scan: NORMAL

## 2024-02-12 ENCOUNTER — Other Ambulatory Visit: Payer: Self-pay | Admitting: Adult Health

## 2024-02-12 ENCOUNTER — Ambulatory Visit: Payer: Self-pay | Admitting: Internal Medicine

## 2024-03-19 ENCOUNTER — Telehealth: Payer: Self-pay | Admitting: Pharmacist

## 2024-03-19 NOTE — Progress Notes (Signed)
" ° °  03/19/2024 Name: Michelle Hicks MRN: [id number] DOB: September 27, 1950  Chief Complaint  Patient presents with   Medication Assistance    Repatha     Patient called to inquire about her [employer] grant. HIPAA identifiers were obtained. Patient's grant was renewed last year. She said the Pharmacy did not have the new information. She also said she did not receive the [facility] information that was sent to her last November.   The [employer] platform was accessed and the Patient's information was retrieved. She has a $2500 benefit that is good until October of this year.    The [employer] information was faxed to the Patient's pharmacy with billing instructions and sent to the Patient.  Patient was instructed to let me know if she has any issues.   Cassius DOROTHA Brought, PharmD, BCACP Clinical Pharmacist [phone]  "

## 2024-05-08 ENCOUNTER — Ambulatory Visit: Payer: Self-pay | Admitting: Internal Medicine

## 2024-05-28 ENCOUNTER — Ambulatory Visit

## 2024-05-28 ENCOUNTER — Ambulatory Visit: Payer: Self-pay

## 2024-07-25 ENCOUNTER — Ambulatory Visit: Admitting: Adult Health

## 2024-07-29 ENCOUNTER — Encounter: Payer: Self-pay | Admitting: Internal Medicine

## 2024-07-31 ENCOUNTER — Ambulatory Visit: Admitting: Adult Health
